# Patient Record
Sex: Male | Born: 1935 | Race: White | Hispanic: No | Marital: Married | Smoking: Never smoker
Health system: Southern US, Community
[De-identification: ages and names within clinical notes are randomized; demographics above are authoritative.]

## PROBLEM LIST (undated history)

## (undated) DIAGNOSIS — G8918 Other acute postprocedural pain: Secondary | ICD-10-CM

## (undated) DIAGNOSIS — F4321 Adjustment disorder with depressed mood: Secondary | ICD-10-CM

## (undated) DIAGNOSIS — E785 Hyperlipidemia, unspecified: Secondary | ICD-10-CM

## (undated) DIAGNOSIS — I10 Essential (primary) hypertension: Secondary | ICD-10-CM

## (undated) DIAGNOSIS — R5381 Other malaise: Secondary | ICD-10-CM

## (undated) DIAGNOSIS — I359 Nonrheumatic aortic valve disorder, unspecified: Secondary | ICD-10-CM

## (undated) DIAGNOSIS — R0789 Other chest pain: Secondary | ICD-10-CM

## (undated) DIAGNOSIS — M1712 Unilateral primary osteoarthritis, left knee: Secondary | ICD-10-CM

## (undated) DIAGNOSIS — R001 Bradycardia, unspecified: Secondary | ICD-10-CM

## (undated) DIAGNOSIS — I4891 Unspecified atrial fibrillation: Secondary | ICD-10-CM

## (undated) DIAGNOSIS — M792 Neuralgia and neuritis, unspecified: Secondary | ICD-10-CM

## (undated) DIAGNOSIS — F0390 Unspecified dementia without behavioral disturbance: Secondary | ICD-10-CM

## (undated) DIAGNOSIS — I6350 Cerebral infarction due to unspecified occlusion or stenosis of unspecified cerebral artery: Secondary | ICD-10-CM

## (undated) DIAGNOSIS — I2510 Atherosclerotic heart disease of native coronary artery without angina pectoris: Secondary | ICD-10-CM

## (undated) DIAGNOSIS — N400 Enlarged prostate without lower urinary tract symptoms: Secondary | ICD-10-CM

## (undated) DIAGNOSIS — I69919 Unspecified symptoms and signs involving cognitive functions following unspecified cerebrovascular disease: Secondary | ICD-10-CM

## (undated) HISTORY — DX: Benign prostatic hyperplasia without lower urinary tract symptoms: N40.0

## (undated) HISTORY — DX: Adjustment disorder with depressed mood: F43.21

## (undated) HISTORY — DX: Nonrheumatic aortic valve disorder, unspecified: I35.9

## (undated) HISTORY — DX: Other chest pain: R07.89

## (undated) HISTORY — DX: Other malaise: R53.81

## (undated) HISTORY — DX: Neuralgia and neuritis, unspecified: M79.2

## (undated) HISTORY — DX: Other acute postprocedural pain: G89.18

## (undated) HISTORY — PX: CARDIAC SURGERY: SHX584

## (undated) HISTORY — DX: Unilateral primary osteoarthritis, left knee: M17.12

---

## 1998-08-07 ENCOUNTER — Emergency Department (HOSPITAL_COMMUNITY): Admission: EM | Admit: 1998-08-07 | Discharge: 1998-08-07 | Payer: Self-pay | Admitting: Emergency Medicine

## 1998-08-23 ENCOUNTER — Encounter: Payer: Self-pay | Admitting: Urology

## 1998-08-24 ENCOUNTER — Encounter: Payer: Self-pay | Admitting: Urology

## 1998-08-25 ENCOUNTER — Inpatient Hospital Stay (HOSPITAL_COMMUNITY): Admission: RE | Admit: 1998-08-25 | Discharge: 1998-08-27 | Payer: Self-pay | Admitting: Urology

## 1998-08-26 ENCOUNTER — Encounter: Payer: Self-pay | Admitting: Urology

## 1998-11-10 ENCOUNTER — Emergency Department (HOSPITAL_COMMUNITY): Admission: EM | Admit: 1998-11-10 | Discharge: 1998-11-10 | Payer: Self-pay | Admitting: Emergency Medicine

## 2002-08-31 DIAGNOSIS — I4891 Unspecified atrial fibrillation: Secondary | ICD-10-CM

## 2002-08-31 HISTORY — DX: Unspecified atrial fibrillation: I48.91

## 2002-10-07 ENCOUNTER — Ambulatory Visit (HOSPITAL_COMMUNITY): Admission: RE | Admit: 2002-10-07 | Discharge: 2002-10-07 | Payer: Self-pay | Admitting: *Deleted

## 2002-10-07 ENCOUNTER — Encounter: Payer: Self-pay | Admitting: *Deleted

## 2002-11-09 ENCOUNTER — Encounter: Payer: Self-pay | Admitting: *Deleted

## 2002-11-09 ENCOUNTER — Ambulatory Visit (HOSPITAL_COMMUNITY): Admission: RE | Admit: 2002-11-09 | Discharge: 2002-11-10 | Payer: Self-pay | Admitting: *Deleted

## 2004-02-28 DIAGNOSIS — I712 Thoracic aortic aneurysm, without rupture, unspecified: Secondary | ICD-10-CM | POA: Insufficient documentation

## 2004-02-28 DIAGNOSIS — I6350 Cerebral infarction due to unspecified occlusion or stenosis of unspecified cerebral artery: Secondary | ICD-10-CM

## 2004-02-28 DIAGNOSIS — Z952 Presence of prosthetic heart valve: Secondary | ICD-10-CM | POA: Insufficient documentation

## 2004-02-28 HISTORY — DX: Cerebral infarction due to unspecified occlusion or stenosis of unspecified cerebral artery: I63.50

## 2004-07-24 ENCOUNTER — Ambulatory Visit: Payer: Self-pay

## 2004-08-11 ENCOUNTER — Ambulatory Visit: Payer: Self-pay | Admitting: *Deleted

## 2004-08-11 ENCOUNTER — Ambulatory Visit: Payer: Self-pay | Admitting: Cardiology

## 2004-08-18 ENCOUNTER — Ambulatory Visit: Payer: Self-pay

## 2004-08-30 ENCOUNTER — Ambulatory Visit: Payer: Self-pay | Admitting: Cardiology

## 2004-09-11 ENCOUNTER — Ambulatory Visit: Payer: Self-pay | Admitting: Cardiology

## 2004-09-25 ENCOUNTER — Ambulatory Visit: Payer: Self-pay | Admitting: Cardiology

## 2004-09-26 ENCOUNTER — Ambulatory Visit: Payer: Self-pay | Admitting: Family Medicine

## 2004-09-27 ENCOUNTER — Ambulatory Visit: Payer: Self-pay | Admitting: Family Medicine

## 2004-09-28 ENCOUNTER — Ambulatory Visit: Payer: Self-pay | Admitting: Family Medicine

## 2004-10-05 ENCOUNTER — Ambulatory Visit: Payer: Self-pay | Admitting: Family Medicine

## 2004-10-09 ENCOUNTER — Ambulatory Visit: Payer: Self-pay | Admitting: Cardiology

## 2004-10-23 ENCOUNTER — Ambulatory Visit: Payer: Self-pay | Admitting: Cardiology

## 2004-10-25 ENCOUNTER — Ambulatory Visit: Payer: Self-pay | Admitting: *Deleted

## 2004-11-09 ENCOUNTER — Ambulatory Visit: Payer: Self-pay | Admitting: Family Medicine

## 2004-11-09 ENCOUNTER — Ambulatory Visit: Payer: Self-pay

## 2004-11-13 ENCOUNTER — Ambulatory Visit: Payer: Self-pay | Admitting: *Deleted

## 2004-11-21 ENCOUNTER — Ambulatory Visit: Payer: Self-pay | Admitting: Internal Medicine

## 2004-12-29 ENCOUNTER — Ambulatory Visit: Payer: Self-pay | Admitting: Cardiology

## 2005-01-04 ENCOUNTER — Ambulatory Visit: Payer: Self-pay | Admitting: Family Medicine

## 2005-01-12 ENCOUNTER — Ambulatory Visit: Payer: Self-pay | Admitting: Cardiology

## 2005-01-30 ENCOUNTER — Encounter (INDEPENDENT_AMBULATORY_CARE_PROVIDER_SITE_OTHER): Payer: Self-pay | Admitting: Internal Medicine

## 2005-01-30 ENCOUNTER — Ambulatory Visit: Payer: Self-pay | Admitting: Family Medicine

## 2005-02-05 ENCOUNTER — Ambulatory Visit: Payer: Self-pay | Admitting: Cardiology

## 2005-02-09 ENCOUNTER — Ambulatory Visit: Payer: Self-pay | Admitting: Family Medicine

## 2005-02-20 ENCOUNTER — Ambulatory Visit: Payer: Self-pay | Admitting: Cardiology

## 2005-03-09 ENCOUNTER — Ambulatory Visit: Payer: Self-pay | Admitting: Cardiology

## 2005-03-22 ENCOUNTER — Ambulatory Visit: Payer: Self-pay | Admitting: Cardiology

## 2005-03-25 ENCOUNTER — Inpatient Hospital Stay (HOSPITAL_COMMUNITY): Admission: EM | Admit: 2005-03-25 | Discharge: 2005-04-04 | Payer: Self-pay | Admitting: Emergency Medicine

## 2005-03-25 ENCOUNTER — Ambulatory Visit: Payer: Self-pay | Admitting: Family Medicine

## 2005-03-28 ENCOUNTER — Ambulatory Visit: Payer: Self-pay | Admitting: Cardiology

## 2005-03-28 ENCOUNTER — Encounter: Payer: Self-pay | Admitting: Cardiology

## 2005-04-09 ENCOUNTER — Ambulatory Visit: Payer: Self-pay | Admitting: Family Medicine

## 2005-04-12 ENCOUNTER — Ambulatory Visit: Payer: Self-pay | Admitting: Family Medicine

## 2005-04-27 ENCOUNTER — Ambulatory Visit: Payer: Self-pay | Admitting: Internal Medicine

## 2005-05-03 ENCOUNTER — Ambulatory Visit: Payer: Self-pay | Admitting: Cardiology

## 2005-06-07 ENCOUNTER — Ambulatory Visit: Payer: Self-pay | Admitting: Cardiology

## 2005-06-07 ENCOUNTER — Ambulatory Visit: Payer: Self-pay | Admitting: Internal Medicine

## 2005-06-11 ENCOUNTER — Ambulatory Visit: Payer: Self-pay | Admitting: Family Medicine

## 2005-06-15 ENCOUNTER — Ambulatory Visit: Payer: Self-pay

## 2005-06-20 ENCOUNTER — Ambulatory Visit: Payer: Self-pay | Admitting: Internal Medicine

## 2005-07-03 ENCOUNTER — Ambulatory Visit: Payer: Self-pay | Admitting: *Deleted

## 2005-07-12 ENCOUNTER — Ambulatory Visit: Payer: Self-pay | Admitting: Family Medicine

## 2005-07-27 ENCOUNTER — Ambulatory Visit: Payer: Self-pay | Admitting: Cardiology

## 2005-08-06 ENCOUNTER — Ambulatory Visit: Payer: Self-pay | Admitting: Cardiology

## 2005-08-23 ENCOUNTER — Ambulatory Visit: Payer: Self-pay | Admitting: Cardiology

## 2005-08-31 ENCOUNTER — Ambulatory Visit: Payer: Self-pay | Admitting: Cardiology

## 2005-09-07 ENCOUNTER — Ambulatory Visit: Payer: Self-pay

## 2005-09-07 ENCOUNTER — Ambulatory Visit: Payer: Self-pay | Admitting: Cardiology

## 2005-09-07 ENCOUNTER — Encounter: Payer: Self-pay | Admitting: Cardiology

## 2005-09-17 ENCOUNTER — Ambulatory Visit: Payer: Self-pay | Admitting: Cardiology

## 2005-10-04 ENCOUNTER — Ambulatory Visit: Payer: Self-pay | Admitting: Family Medicine

## 2005-10-08 ENCOUNTER — Ambulatory Visit: Payer: Self-pay | Admitting: Internal Medicine

## 2005-10-08 ENCOUNTER — Ambulatory Visit: Payer: Self-pay | Admitting: Cardiology

## 2005-11-05 ENCOUNTER — Ambulatory Visit: Payer: Self-pay | Admitting: Cardiology

## 2005-11-08 ENCOUNTER — Ambulatory Visit: Payer: Self-pay | Admitting: Family Medicine

## 2005-11-21 ENCOUNTER — Ambulatory Visit: Payer: Self-pay | Admitting: Cardiology

## 2005-12-03 ENCOUNTER — Ambulatory Visit: Payer: Self-pay | Admitting: Cardiology

## 2005-12-14 ENCOUNTER — Ambulatory Visit: Payer: Self-pay | Admitting: Family Medicine

## 2005-12-17 ENCOUNTER — Ambulatory Visit: Payer: Self-pay | Admitting: Cardiology

## 2006-02-28 ENCOUNTER — Ambulatory Visit: Payer: Self-pay | Admitting: Cardiology

## 2006-03-28 ENCOUNTER — Ambulatory Visit: Payer: Self-pay | Admitting: Cardiology

## 2006-04-18 ENCOUNTER — Ambulatory Visit: Payer: Self-pay | Admitting: Family Medicine

## 2006-05-02 ENCOUNTER — Ambulatory Visit: Payer: Self-pay | Admitting: Cardiology

## 2006-05-06 ENCOUNTER — Ambulatory Visit: Payer: Self-pay | Admitting: Cardiology

## 2006-05-08 ENCOUNTER — Ambulatory Visit: Payer: Self-pay | Admitting: Cardiology

## 2006-05-14 ENCOUNTER — Ambulatory Visit: Payer: Self-pay | Admitting: Internal Medicine

## 2006-05-14 ENCOUNTER — Ambulatory Visit: Payer: Self-pay | Admitting: Cardiology

## 2006-05-14 LAB — CONVERTED CEMR LAB
ALT: 15 units/L (ref 0–40)
AST: 16 units/L (ref 0–37)
BUN: 10 mg/dL (ref 6–23)
Cholesterol: 125 mg/dL (ref 0–200)
Glomerular Filtration Rate, Af Am: 93 mL/min/{1.73_m2}
LDL Cholesterol: 75 mg/dL (ref 0–99)
Triglyceride fasting, serum: 133 mg/dL (ref 0–149)
VLDL: 27 mg/dL (ref 0–40)

## 2006-05-22 ENCOUNTER — Ambulatory Visit: Payer: Self-pay | Admitting: Cardiology

## 2006-06-05 ENCOUNTER — Ambulatory Visit: Payer: Self-pay | Admitting: Cardiology

## 2006-06-21 ENCOUNTER — Ambulatory Visit: Payer: Self-pay | Admitting: Cardiology

## 2006-07-04 ENCOUNTER — Ambulatory Visit: Payer: Self-pay | Admitting: Cardiology

## 2006-07-18 ENCOUNTER — Ambulatory Visit: Payer: Self-pay | Admitting: Cardiology

## 2006-07-22 ENCOUNTER — Ambulatory Visit: Payer: Self-pay | Admitting: Family Medicine

## 2006-08-12 ENCOUNTER — Ambulatory Visit: Payer: Self-pay | Admitting: Cardiology

## 2006-08-19 ENCOUNTER — Ambulatory Visit: Payer: Self-pay | Admitting: Internal Medicine

## 2006-08-29 ENCOUNTER — Ambulatory Visit: Payer: Self-pay | Admitting: *Deleted

## 2006-09-11 ENCOUNTER — Ambulatory Visit: Payer: Self-pay | Admitting: Cardiology

## 2006-10-02 ENCOUNTER — Ambulatory Visit: Payer: Self-pay | Admitting: Cardiology

## 2006-10-15 ENCOUNTER — Ambulatory Visit: Payer: Self-pay | Admitting: Family Medicine

## 2006-10-31 ENCOUNTER — Ambulatory Visit: Payer: Self-pay | Admitting: Internal Medicine

## 2006-11-14 ENCOUNTER — Ambulatory Visit: Payer: Self-pay | Admitting: Internal Medicine

## 2006-11-27 ENCOUNTER — Ambulatory Visit: Payer: Self-pay | Admitting: Cardiology

## 2006-12-11 ENCOUNTER — Ambulatory Visit: Payer: Self-pay | Admitting: Cardiovascular Disease

## 2006-12-19 ENCOUNTER — Ambulatory Visit: Payer: Self-pay | Admitting: Cardiology

## 2007-01-07 ENCOUNTER — Ambulatory Visit: Payer: Self-pay | Admitting: Cardiology

## 2007-01-07 LAB — CONVERTED CEMR LAB
BUN: 11 mg/dL (ref 6–23)
CO2: 27 meq/L (ref 19–32)
Calcium: 9 mg/dL (ref 8.4–10.5)
Creatinine, Ser: 1 mg/dL (ref 0.4–1.5)
GFR calc non Af Amer: 77 mL/min
Sodium: 142 meq/L (ref 135–145)

## 2007-01-21 ENCOUNTER — Ambulatory Visit: Payer: Self-pay | Admitting: Cardiology

## 2007-02-25 DIAGNOSIS — I10 Essential (primary) hypertension: Secondary | ICD-10-CM

## 2007-02-27 DIAGNOSIS — I2510 Atherosclerotic heart disease of native coronary artery without angina pectoris: Secondary | ICD-10-CM

## 2007-02-27 DIAGNOSIS — E785 Hyperlipidemia, unspecified: Secondary | ICD-10-CM

## 2007-02-27 HISTORY — DX: Hyperlipidemia, unspecified: E78.5

## 2007-02-27 HISTORY — DX: Atherosclerotic heart disease of native coronary artery without angina pectoris: I25.10

## 2007-03-04 ENCOUNTER — Ambulatory Visit: Payer: Self-pay | Admitting: Family Medicine

## 2007-03-07 LAB — CONVERTED CEMR LAB
Albumin: 4.4 g/dL (ref 3.5–5.2)
Alkaline Phosphatase: 42 units/L (ref 39–117)
Basophils Absolute: 0 10*3/uL (ref 0.0–0.1)
Basophils Relative: 0 % (ref 0–1)
CO2: 22 meq/L (ref 19–32)
Calcium: 9.6 mg/dL (ref 8.4–10.5)
Chloride: 110 meq/L (ref 96–112)
Creatinine, Ser: 0.97 mg/dL (ref 0.40–1.50)
Eosinophils Relative: 2 % (ref 0–5)
Glucose, Bld: 234 mg/dL — ABNORMAL HIGH (ref 70–99)
HCT: 45.5 % (ref 39.0–52.0)
HDL: 30 mg/dL — ABNORMAL LOW (ref >39)
INR: 2 — ABNORMAL HIGH (ref 0.0–1.5)
LDL Cholesterol: 52 mg/dL (ref 0–99)
Lymphocytes Relative: 34 % (ref 12–46)
Lymphs Abs: 1.7 10*3/uL (ref 0.7–3.3)
MCV: 95.2 fL (ref 78.0–100.0)
Monocytes Absolute: 0.5 10*3/uL (ref 0.2–0.7)
Monocytes Relative: 9 % (ref 3–11)
Neutro Abs: 2.7 10*3/uL (ref 1.7–7.7)
Neutrophils Relative %: 55 % (ref 43–77)
Prothrombin Time: 23.6 s — ABNORMAL HIGH (ref 11.6–15.2)
Sodium: 145 meq/L (ref 135–145)
Total Protein: 7.8 g/dL (ref 6.0–8.3)
WBC: 5 10*3/uL (ref 4.0–10.5)

## 2007-03-10 ENCOUNTER — Ambulatory Visit: Payer: Self-pay | Admitting: Cardiology

## 2007-03-11 ENCOUNTER — Encounter (INDEPENDENT_AMBULATORY_CARE_PROVIDER_SITE_OTHER): Payer: Self-pay | Admitting: Family Medicine

## 2007-03-18 ENCOUNTER — Encounter (INDEPENDENT_AMBULATORY_CARE_PROVIDER_SITE_OTHER): Payer: Self-pay | Admitting: *Deleted

## 2007-03-18 ENCOUNTER — Encounter (INDEPENDENT_AMBULATORY_CARE_PROVIDER_SITE_OTHER): Payer: Self-pay | Admitting: Family Medicine

## 2007-03-18 DIAGNOSIS — M1990 Unspecified osteoarthritis, unspecified site: Secondary | ICD-10-CM | POA: Insufficient documentation

## 2007-03-24 ENCOUNTER — Ambulatory Visit: Payer: Self-pay | Admitting: Internal Medicine

## 2007-04-14 ENCOUNTER — Ambulatory Visit: Payer: Self-pay | Admitting: Cardiology

## 2007-05-05 ENCOUNTER — Ambulatory Visit: Payer: Self-pay | Admitting: Cardiology

## 2007-05-15 ENCOUNTER — Ambulatory Visit: Payer: Self-pay | Admitting: Cardiovascular Disease

## 2007-05-28 ENCOUNTER — Ambulatory Visit: Payer: Self-pay | Admitting: Cardiology

## 2007-05-28 ENCOUNTER — Encounter (INDEPENDENT_AMBULATORY_CARE_PROVIDER_SITE_OTHER): Payer: Self-pay | Admitting: Family Medicine

## 2007-06-11 ENCOUNTER — Ambulatory Visit: Payer: Self-pay | Admitting: Cardiovascular Disease

## 2007-06-16 ENCOUNTER — Ambulatory Visit: Payer: Self-pay | Admitting: Internal Medicine

## 2007-06-16 ENCOUNTER — Encounter (INDEPENDENT_AMBULATORY_CARE_PROVIDER_SITE_OTHER): Payer: Self-pay | Admitting: Family Medicine

## 2007-06-16 ENCOUNTER — Encounter: Payer: Self-pay | Admitting: Cardiology

## 2007-06-16 ENCOUNTER — Ambulatory Visit: Payer: Self-pay

## 2007-06-16 LAB — CONVERTED CEMR LAB
ALT: 16 units/L (ref 0–53)
AST: 20 units/L (ref 0–37)
Alkaline Phosphatase: 34 units/L — ABNORMAL LOW (ref 39–117)
BUN: 12 mg/dL (ref 6–23)
Calcium: 9.4 mg/dL (ref 8.4–10.5)
Chloride: 108 meq/L (ref 96–112)
Creatinine, Ser: 0.9 mg/dL (ref 0.4–1.5)
GFR calc Af Amer: 105 mL/min
GFR calc non Af Amer: 87 mL/min
Glucose, Bld: 153 mg/dL — ABNORMAL HIGH (ref 70–99)
HCT: 39.5 % (ref 39.0–52.0)
Lymphocytes Relative: 35.5 % (ref 12.0–46.0)
MCV: 90.6 fL (ref 78.0–100.0)
Monocytes Absolute: 0.4 10*3/uL (ref 0.2–0.7)
RDW: 12.8 % (ref 11.5–14.6)
Sodium: 143 meq/L (ref 135–145)
Total Protein: 7.1 g/dL (ref 6.0–8.3)
WBC: 5.1 10*3/uL (ref 4.5–10.5)

## 2007-07-07 ENCOUNTER — Ambulatory Visit: Payer: Self-pay | Admitting: Cardiology

## 2007-08-05 ENCOUNTER — Ambulatory Visit: Payer: Self-pay | Admitting: Cardiology

## 2007-08-19 ENCOUNTER — Ambulatory Visit: Payer: Self-pay | Admitting: Family Medicine

## 2007-08-19 LAB — CONVERTED CEMR LAB: Hgb A1c MFr Bld: 9.4 %

## 2007-08-20 ENCOUNTER — Ambulatory Visit: Payer: Self-pay | Admitting: Internal Medicine

## 2007-09-09 ENCOUNTER — Observation Stay (HOSPITAL_COMMUNITY): Admission: EM | Admit: 2007-09-09 | Discharge: 2007-09-10 | Payer: Self-pay | Admitting: Emergency Medicine

## 2007-09-09 ENCOUNTER — Ambulatory Visit: Payer: Self-pay | Admitting: Family Medicine

## 2007-09-09 DIAGNOSIS — I498 Other specified cardiac arrhythmias: Secondary | ICD-10-CM | POA: Insufficient documentation

## 2007-09-10 ENCOUNTER — Encounter (INDEPENDENT_AMBULATORY_CARE_PROVIDER_SITE_OTHER): Payer: Self-pay | Admitting: Family Medicine

## 2007-09-11 ENCOUNTER — Ambulatory Visit: Payer: Self-pay | Admitting: Cardiology

## 2007-09-17 ENCOUNTER — Ambulatory Visit: Payer: Self-pay | Admitting: Cardiology

## 2007-09-26 ENCOUNTER — Inpatient Hospital Stay (HOSPITAL_COMMUNITY): Admission: EM | Admit: 2007-09-26 | Discharge: 2007-10-02 | Payer: Self-pay | Admitting: Emergency Medicine

## 2007-09-26 ENCOUNTER — Encounter (INDEPENDENT_AMBULATORY_CARE_PROVIDER_SITE_OTHER): Payer: Self-pay | Admitting: Family Medicine

## 2007-09-26 ENCOUNTER — Ambulatory Visit: Payer: Self-pay | Admitting: Cardiology

## 2007-09-26 DIAGNOSIS — K922 Gastrointestinal hemorrhage, unspecified: Secondary | ICD-10-CM | POA: Insufficient documentation

## 2007-10-02 ENCOUNTER — Ambulatory Visit: Payer: Self-pay | Admitting: Gastroenterology

## 2007-10-14 ENCOUNTER — Encounter (INDEPENDENT_AMBULATORY_CARE_PROVIDER_SITE_OTHER): Payer: Self-pay | Admitting: Family Medicine

## 2007-10-20 ENCOUNTER — Ambulatory Visit: Payer: Self-pay | Admitting: Cardiology

## 2007-10-20 LAB — CONVERTED CEMR LAB
BUN: 17 mg/dL (ref 6–23)
Basophils Relative: 0.4 % (ref 0.0–1.0)
Calcium: 9.3 mg/dL (ref 8.4–10.5)
Chloride: 107 meq/L (ref 96–112)
Creatinine, Ser: 0.9 mg/dL (ref 0.4–1.5)
Eosinophils Relative: 2.3 % (ref 0.0–5.0)
GFR calc non Af Amer: 87 mL/min
Glucose, Bld: 110 mg/dL — ABNORMAL HIGH (ref 70–99)
HCT: 35.6 % — ABNORMAL LOW (ref 39.0–52.0)
Lymphocytes Relative: 30.7 % (ref 12.0–46.0)
MCHC: 33.7 g/dL (ref 30.0–36.0)
MCV: 91.5 fL (ref 78.0–100.0)
Monocytes Relative: 8.6 % (ref 3.0–12.0)
Neutrophils Relative %: 58 % (ref 43.0–77.0)
Potassium: 3.8 meq/L (ref 3.5–5.1)
RBC: 3.89 M/uL — ABNORMAL LOW (ref 4.22–5.81)

## 2007-11-13 ENCOUNTER — Encounter: Payer: Self-pay | Admitting: Internal Medicine

## 2007-12-24 ENCOUNTER — Encounter (INDEPENDENT_AMBULATORY_CARE_PROVIDER_SITE_OTHER): Payer: Self-pay | Admitting: Family Medicine

## 2007-12-24 ENCOUNTER — Ambulatory Visit: Payer: Self-pay | Admitting: Internal Medicine

## 2007-12-24 ENCOUNTER — Ambulatory Visit: Payer: Self-pay | Admitting: Cardiology

## 2007-12-24 LAB — CONVERTED CEMR LAB
Alkaline Phosphatase: 42 units/L (ref 39–117)
BUN: 15 mg/dL (ref 6–23)
Basophils Relative: 0.7 % (ref 0.0–1.0)
Bilirubin, Direct: 0.1 mg/dL (ref 0.0–0.3)
Calcium: 9.6 mg/dL (ref 8.4–10.5)
Cholesterol: 110 mg/dL (ref 0–200)
Direct LDL: 59.2 mg/dL
Eosinophils Absolute: 0.2 10*3/uL (ref 0.0–0.7)
Eosinophils Relative: 3.6 % (ref 0.0–5.0)
HDL: 26.5 mg/dL — ABNORMAL LOW (ref >39.0)
Hemoglobin: 12.3 g/dL — ABNORMAL LOW (ref 13.0–17.0)
Lymphocytes Relative: 27.7 % (ref 12.0–46.0)
Neutro Abs: 3.8 10*3/uL (ref 1.4–7.7)
Potassium: 4.8 meq/L (ref 3.5–5.1)
Pro B Natriuretic peptide (BNP): 138 pg/mL — ABNORMAL HIGH (ref 0.0–100.0)
Sodium: 137 meq/L (ref 135–145)
Total CHOL/HDL Ratio: 4.2
Total Protein: 7.4 g/dL (ref 6.0–8.3)
Triglycerides: 267 mg/dL (ref 0–149)
WBC: 6.5 10*3/uL (ref 4.5–10.5)

## 2007-12-30 ENCOUNTER — Ambulatory Visit: Payer: Self-pay

## 2007-12-30 ENCOUNTER — Encounter (INDEPENDENT_AMBULATORY_CARE_PROVIDER_SITE_OTHER): Payer: Self-pay | Admitting: Family Medicine

## 2008-03-03 ENCOUNTER — Ambulatory Visit: Payer: Self-pay | Admitting: Cardiology

## 2008-03-09 ENCOUNTER — Telehealth (INDEPENDENT_AMBULATORY_CARE_PROVIDER_SITE_OTHER): Payer: Self-pay | Admitting: *Deleted

## 2008-03-09 ENCOUNTER — Ambulatory Visit: Payer: Self-pay | Admitting: Cardiology

## 2008-03-16 ENCOUNTER — Ambulatory Visit: Payer: Self-pay | Admitting: Family Medicine

## 2008-03-17 ENCOUNTER — Ambulatory Visit: Payer: Self-pay | Admitting: Cardiovascular Disease

## 2008-03-26 ENCOUNTER — Ambulatory Visit: Payer: Self-pay | Admitting: Cardiology

## 2008-03-26 LAB — CONVERTED CEMR LAB
Basophils Absolute: 0 10*3/uL (ref 0.0–0.1)
Basophils Relative: 0.4 % (ref 0.0–3.0)
Eosinophils Absolute: 0.2 10*3/uL (ref 0.0–0.7)
Eosinophils Relative: 2.7 % (ref 0.0–5.0)
HCT: 40.5 % (ref 39.0–52.0)
Hemoglobin: 14 g/dL (ref 13.0–17.0)
Monocytes Absolute: 0.6 10*3/uL (ref 0.1–1.0)
Neutro Abs: 2.8 10*3/uL (ref 1.4–7.7)
Platelets: 131 10*3/uL — ABNORMAL LOW (ref 150–400)

## 2008-04-02 ENCOUNTER — Ambulatory Visit: Payer: Self-pay | Admitting: Cardiovascular Disease

## 2008-04-02 ENCOUNTER — Ambulatory Visit: Payer: Self-pay | Admitting: Cardiology

## 2008-04-02 LAB — CONVERTED CEMR LAB
BUN: 16 mg/dL (ref 6–23)
Basophils Absolute: 0 10*3/uL (ref 0.0–0.1)
Basophils Relative: 0.9 % (ref 0.0–3.0)
Chloride: 101 meq/L (ref 96–112)
Eosinophils Absolute: 0.2 10*3/uL (ref 0.0–0.7)
Eosinophils Relative: 3.3 % (ref 0.0–5.0)
GFR calc non Af Amer: 87 mL/min
Glucose, Bld: 280 mg/dL — ABNORMAL HIGH (ref 70–99)
Lymphocytes Relative: 34.8 % (ref 12.0–46.0)
Neutro Abs: 2.8 10*3/uL (ref 1.4–7.7)
Neutrophils Relative %: 51.7 % (ref 43.0–77.0)
Platelets: 136 10*3/uL — ABNORMAL LOW (ref 150–400)
Potassium: 4.6 meq/L (ref 3.5–5.1)
RBC: 4.74 M/uL (ref 4.22–5.81)
RDW: 16.5 % — ABNORMAL HIGH (ref 11.5–14.6)
WBC: 5.4 10*3/uL (ref 4.5–10.5)

## 2008-04-09 ENCOUNTER — Ambulatory Visit: Payer: Self-pay | Admitting: Cardiology

## 2008-04-13 ENCOUNTER — Ambulatory Visit: Payer: Self-pay | Admitting: Family Medicine

## 2008-04-13 ENCOUNTER — Encounter (INDEPENDENT_AMBULATORY_CARE_PROVIDER_SITE_OTHER): Payer: Self-pay | Admitting: Family Medicine

## 2008-04-20 ENCOUNTER — Ambulatory Visit: Payer: Self-pay | Admitting: Family Medicine

## 2008-04-30 ENCOUNTER — Ambulatory Visit: Payer: Self-pay | Admitting: Internal Medicine

## 2008-05-18 ENCOUNTER — Ambulatory Visit: Payer: Self-pay | Admitting: Family Medicine

## 2008-05-18 LAB — CONVERTED CEMR LAB: Blood Glucose, Fingerstick: 212

## 2008-06-03 ENCOUNTER — Ambulatory Visit: Payer: Self-pay | Admitting: Internal Medicine

## 2008-07-19 ENCOUNTER — Ambulatory Visit: Payer: Self-pay | Admitting: Family Medicine

## 2008-07-19 LAB — CONVERTED CEMR LAB: Blood Glucose, Fingerstick: 145

## 2008-09-17 ENCOUNTER — Telehealth (INDEPENDENT_AMBULATORY_CARE_PROVIDER_SITE_OTHER): Payer: Self-pay | Admitting: Family Medicine

## 2008-10-14 ENCOUNTER — Ambulatory Visit: Payer: Self-pay | Admitting: Cardiology

## 2008-10-18 ENCOUNTER — Telehealth (INDEPENDENT_AMBULATORY_CARE_PROVIDER_SITE_OTHER): Payer: Self-pay | Admitting: Family Medicine

## 2008-11-01 ENCOUNTER — Ambulatory Visit: Payer: Self-pay | Admitting: Internal Medicine

## 2008-11-15 ENCOUNTER — Ambulatory Visit: Payer: Self-pay | Admitting: Internal Medicine

## 2008-11-16 ENCOUNTER — Ambulatory Visit: Payer: Self-pay | Admitting: Family Medicine

## 2008-11-16 LAB — CONVERTED CEMR LAB
Blood Glucose, Fingerstick: 242
Hgb A1c MFr Bld: 8.6 %

## 2008-11-17 LAB — CONVERTED CEMR LAB
Alkaline Phosphatase: 44 units/L (ref 39–117)
BUN: 15 mg/dL (ref 6–23)
Basophils Absolute: 0 10*3/uL (ref 0.0–0.1)
Basophils Relative: 0 % (ref 0–1)
Calcium: 9.7 mg/dL (ref 8.4–10.5)
Chloride: 107 meq/L (ref 96–112)
Cholesterol: 112 mg/dL (ref 0–200)
Eosinophils Absolute: 0.2 10*3/uL (ref 0.0–0.7)
Glucose, Bld: 306 mg/dL — ABNORMAL HIGH (ref 70–99)
HCT: 42.8 % (ref 39.0–52.0)
HDL: 30 mg/dL — ABNORMAL LOW (ref >39)
LDL Cholesterol: 57 mg/dL (ref 0–99)
Lymphs Abs: 1.8 10*3/uL (ref 0.7–4.0)
MCHC: 33.2 g/dL (ref 30.0–36.0)
Monocytes Relative: 9 % (ref 3–12)
Neutro Abs: 2.5 10*3/uL (ref 1.7–7.7)
Potassium: 5.1 meq/L (ref 3.5–5.3)
RBC: 4.65 M/uL (ref 4.22–5.81)
Sodium: 141 meq/L (ref 135–145)
VLDL: 25 mg/dL (ref 0–40)

## 2008-11-30 ENCOUNTER — Encounter: Payer: Self-pay | Admitting: *Deleted

## 2008-12-02 ENCOUNTER — Ambulatory Visit: Payer: Self-pay | Admitting: Internal Medicine

## 2008-12-16 ENCOUNTER — Encounter (INDEPENDENT_AMBULATORY_CARE_PROVIDER_SITE_OTHER): Payer: Self-pay | Admitting: Cardiology

## 2008-12-16 ENCOUNTER — Ambulatory Visit: Payer: Self-pay | Admitting: Internal Medicine

## 2009-01-05 ENCOUNTER — Encounter: Payer: Self-pay | Admitting: *Deleted

## 2009-01-06 ENCOUNTER — Ambulatory Visit: Payer: Self-pay | Admitting: Cardiology

## 2009-01-06 LAB — CONVERTED CEMR LAB
Albumin: 3.6 g/dL (ref 3.5–5.2)
BUN: 13 mg/dL (ref 6–23)
Basophils Absolute: 0 10*3/uL (ref 0.0–0.1)
Basophils Relative: 0.5 % (ref 0.0–3.0)
Calcium: 8.9 mg/dL (ref 8.4–10.5)
Cholesterol: 125 mg/dL (ref 0–200)
Eosinophils Absolute: 0.1 10*3/uL (ref 0.0–0.7)
GFR calc non Af Amer: 86.46 mL/min (ref >60)
Glucose, Bld: 203 mg/dL — ABNORMAL HIGH (ref 70–99)
HDL: 29.4 mg/dL — ABNORMAL LOW (ref >39.00)
Lymphocytes Relative: 31.4 % (ref 12.0–46.0)
Lymphs Abs: 1.8 10*3/uL (ref 0.7–4.0)
MCHC: 35 g/dL (ref 30.0–36.0)
MCV: 90.1 fL (ref 78.0–100.0)
Monocytes Absolute: 0.6 10*3/uL (ref 0.1–1.0)
Monocytes Relative: 10.2 % (ref 3.0–12.0)
Neutro Abs: 3.2 10*3/uL (ref 1.4–7.7)
RBC: 4.46 M/uL (ref 4.22–5.81)
RDW: 12.9 % (ref 11.5–14.6)
Total CHOL/HDL Ratio: 4
Total Protein: 6.8 g/dL (ref 6.0–8.3)
VLDL: 34.4 mg/dL (ref 0.0–40.0)

## 2009-01-07 ENCOUNTER — Encounter: Payer: Self-pay | Admitting: Cardiology

## 2009-01-13 ENCOUNTER — Encounter: Payer: Self-pay | Admitting: Cardiology

## 2009-01-13 ENCOUNTER — Ambulatory Visit: Payer: Self-pay

## 2009-01-18 ENCOUNTER — Encounter (INDEPENDENT_AMBULATORY_CARE_PROVIDER_SITE_OTHER): Payer: Self-pay | Admitting: *Deleted

## 2009-01-18 ENCOUNTER — Ambulatory Visit: Payer: Self-pay | Admitting: Internal Medicine

## 2009-01-18 DIAGNOSIS — R0989 Other specified symptoms and signs involving the circulatory and respiratory systems: Secondary | ICD-10-CM

## 2009-01-26 ENCOUNTER — Ambulatory Visit: Payer: Self-pay | Admitting: Vascular Surgery

## 2009-01-26 ENCOUNTER — Encounter (INDEPENDENT_AMBULATORY_CARE_PROVIDER_SITE_OTHER): Payer: Self-pay | Admitting: Internal Medicine

## 2009-01-26 ENCOUNTER — Ambulatory Visit (HOSPITAL_COMMUNITY): Admission: RE | Admit: 2009-01-26 | Discharge: 2009-01-26 | Payer: Self-pay | Admitting: Internal Medicine

## 2009-01-29 ENCOUNTER — Encounter (INDEPENDENT_AMBULATORY_CARE_PROVIDER_SITE_OTHER): Payer: Self-pay | Admitting: Internal Medicine

## 2009-02-01 ENCOUNTER — Ambulatory Visit: Payer: Self-pay | Admitting: Internal Medicine

## 2009-02-21 ENCOUNTER — Ambulatory Visit: Payer: Self-pay | Admitting: Cardiovascular Disease

## 2009-03-21 ENCOUNTER — Ambulatory Visit: Payer: Self-pay | Admitting: Cardiology

## 2009-04-18 ENCOUNTER — Ambulatory Visit: Payer: Self-pay | Admitting: Internal Medicine

## 2009-04-21 ENCOUNTER — Ambulatory Visit: Payer: Self-pay | Admitting: Internal Medicine

## 2009-04-21 LAB — CONVERTED CEMR LAB: Blood Glucose, Fingerstick: 162

## 2009-05-16 ENCOUNTER — Ambulatory Visit: Payer: Self-pay | Admitting: Cardiology

## 2009-05-16 LAB — CONVERTED CEMR LAB: POC INR: 2

## 2009-06-13 ENCOUNTER — Ambulatory Visit: Payer: Self-pay | Admitting: Cardiology

## 2009-07-13 ENCOUNTER — Ambulatory Visit: Payer: Self-pay | Admitting: Internal Medicine

## 2009-07-13 LAB — CONVERTED CEMR LAB: POC INR: 1.4

## 2009-07-22 ENCOUNTER — Ambulatory Visit: Payer: Self-pay | Admitting: Internal Medicine

## 2009-07-22 LAB — CONVERTED CEMR LAB
Albumin: 4.6 g/dL (ref 3.5–5.2)
BUN: 13 mg/dL (ref 6–23)
Chloride: 105 meq/L (ref 96–112)
LDL Cholesterol: 74 mg/dL (ref 0–99)
Potassium: 5 meq/L (ref 3.5–5.3)
Total Bilirubin: 0.9 mg/dL (ref 0.3–1.2)
Total CHOL/HDL Ratio: 3.9
Total Protein: 7.4 g/dL (ref 6.0–8.3)

## 2009-08-04 ENCOUNTER — Encounter (INDEPENDENT_AMBULATORY_CARE_PROVIDER_SITE_OTHER): Payer: Self-pay | Admitting: Internal Medicine

## 2009-08-23 ENCOUNTER — Ambulatory Visit: Payer: Self-pay | Admitting: Internal Medicine

## 2009-08-24 ENCOUNTER — Telehealth (INDEPENDENT_AMBULATORY_CARE_PROVIDER_SITE_OTHER): Payer: Self-pay | Admitting: *Deleted

## 2009-08-29 ENCOUNTER — Ambulatory Visit: Payer: Self-pay | Admitting: Internal Medicine

## 2009-08-29 LAB — CONVERTED CEMR LAB: POC INR: 2.5

## 2009-09-09 ENCOUNTER — Ambulatory Visit: Payer: Self-pay | Admitting: Internal Medicine

## 2009-09-09 DIAGNOSIS — I69919 Unspecified symptoms and signs involving cognitive functions following unspecified cerebrovascular disease: Secondary | ICD-10-CM

## 2009-09-09 HISTORY — DX: Unspecified symptoms and signs involving cognitive functions following unspecified cerebrovascular disease: I69.919

## 2009-09-10 ENCOUNTER — Telehealth (INDEPENDENT_AMBULATORY_CARE_PROVIDER_SITE_OTHER): Payer: Self-pay | Admitting: Internal Medicine

## 2009-09-12 ENCOUNTER — Encounter (INDEPENDENT_AMBULATORY_CARE_PROVIDER_SITE_OTHER): Payer: Self-pay | Admitting: Internal Medicine

## 2009-09-20 ENCOUNTER — Encounter (INDEPENDENT_AMBULATORY_CARE_PROVIDER_SITE_OTHER): Payer: Self-pay | Admitting: Internal Medicine

## 2009-09-26 ENCOUNTER — Encounter (INDEPENDENT_AMBULATORY_CARE_PROVIDER_SITE_OTHER): Payer: Self-pay | Admitting: Cardiology

## 2009-09-26 ENCOUNTER — Ambulatory Visit: Payer: Self-pay | Admitting: Internal Medicine

## 2009-09-26 LAB — CONVERTED CEMR LAB: POC INR: 1.9

## 2009-10-24 ENCOUNTER — Ambulatory Visit: Payer: Self-pay | Admitting: Cardiology

## 2009-10-24 LAB — CONVERTED CEMR LAB: POC INR: 1.9

## 2009-11-21 ENCOUNTER — Ambulatory Visit: Payer: Self-pay | Admitting: Cardiology

## 2009-11-22 ENCOUNTER — Ambulatory Visit: Payer: Self-pay | Admitting: Internal Medicine

## 2009-11-22 DIAGNOSIS — M25569 Pain in unspecified knee: Secondary | ICD-10-CM

## 2009-11-22 LAB — CONVERTED CEMR LAB
Blood Glucose, Fingerstick: 206
Microalb, Ur: 2.61 mg/dL — ABNORMAL HIGH (ref 0.00–1.89)

## 2009-12-19 ENCOUNTER — Ambulatory Visit (HOSPITAL_COMMUNITY): Admission: RE | Admit: 2009-12-19 | Discharge: 2009-12-19 | Payer: Self-pay | Admitting: Internal Medicine

## 2009-12-19 ENCOUNTER — Ambulatory Visit: Payer: Self-pay | Admitting: Cardiology

## 2009-12-19 LAB — CONVERTED CEMR LAB: POC INR: 2.7

## 2009-12-20 ENCOUNTER — Encounter (INDEPENDENT_AMBULATORY_CARE_PROVIDER_SITE_OTHER): Payer: Self-pay | Admitting: Internal Medicine

## 2010-01-18 ENCOUNTER — Ambulatory Visit: Payer: Self-pay | Admitting: Cardiology

## 2010-01-18 LAB — CONVERTED CEMR LAB: POC INR: 2.3

## 2010-02-20 ENCOUNTER — Ambulatory Visit: Payer: Self-pay | Admitting: Cardiology

## 2010-02-20 LAB — CONVERTED CEMR LAB: POC INR: 2.7

## 2010-03-20 ENCOUNTER — Ambulatory Visit (HOSPITAL_COMMUNITY): Admission: RE | Admit: 2010-03-20 | Discharge: 2010-03-20 | Payer: Self-pay | Admitting: Cardiology

## 2010-03-20 ENCOUNTER — Ambulatory Visit: Payer: Self-pay | Admitting: Internal Medicine

## 2010-03-20 ENCOUNTER — Ambulatory Visit: Payer: Self-pay

## 2010-03-20 ENCOUNTER — Ambulatory Visit: Payer: Self-pay | Admitting: Cardiology

## 2010-03-20 ENCOUNTER — Encounter: Payer: Self-pay | Admitting: Cardiology

## 2010-03-20 LAB — CONVERTED CEMR LAB: POC INR: 3.3

## 2010-03-28 ENCOUNTER — Ambulatory Visit: Payer: Self-pay | Admitting: Internal Medicine

## 2010-03-28 LAB — CONVERTED CEMR LAB: Hgb A1c MFr Bld: 6.8 %

## 2010-04-18 ENCOUNTER — Ambulatory Visit: Payer: Self-pay | Admitting: Cardiology

## 2010-04-18 LAB — CONVERTED CEMR LAB: POC INR: 4.5

## 2010-05-08 ENCOUNTER — Ambulatory Visit: Payer: Self-pay | Admitting: Internal Medicine

## 2010-05-30 ENCOUNTER — Ambulatory Visit: Payer: Self-pay | Admitting: Internal Medicine

## 2010-05-30 LAB — CONVERTED CEMR LAB
ALT: 9 units/L (ref 0–53)
Bilirubin, Direct: 0.2 mg/dL (ref 0.0–0.3)
LDL Cholesterol: 70 mg/dL (ref 0–99)
Total Bilirubin: 0.8 mg/dL (ref 0.3–1.2)
Total CHOL/HDL Ratio: 5.4
VLDL: 31 mg/dL (ref 0–40)

## 2010-06-05 ENCOUNTER — Ambulatory Visit: Payer: Self-pay | Admitting: Cardiovascular Disease

## 2010-06-05 LAB — CONVERTED CEMR LAB: POC INR: 2.9

## 2010-06-21 ENCOUNTER — Encounter (INDEPENDENT_AMBULATORY_CARE_PROVIDER_SITE_OTHER): Payer: Self-pay | Admitting: Internal Medicine

## 2010-07-04 ENCOUNTER — Ambulatory Visit: Admission: RE | Admit: 2010-07-04 | Discharge: 2010-07-04 | Payer: Self-pay | Source: Home / Self Care

## 2010-07-04 LAB — CONVERTED CEMR LAB: POC INR: 2.3

## 2010-07-30 LAB — CONVERTED CEMR LAB
Blood Glucose, Fingerstick: 391
POC INR: 2.8

## 2010-08-01 NOTE — Medication Information (Signed)
Summary: rov/tm  Anticoagulant Therapy  Managed by: Bethena Midget, RN, BSN Referring MD: Olga Millers MD Supervising MD: Shirlee Latch MD, Naksh Radi Indication 1: atrial fibrillation (ICD-427.31) Indication 2: reactivated 07/24/04..returning from [facility] (ICD-1/23/6 Lab Used: [facility] Site: [facility] INR POC 2.3 INR RANGE 2 - 3  Dietary changes: no    Health status changes: no    Bleeding/hemorrhagic complications: no    Recent/future hospitalizations: no    Any changes in medication regimen? no    Recent/future dental: no  Any missed doses?: no       Is patient compliant with meds? yes       Allergies: No Known Drug Allergies  Anticoagulation Management History:      The patient is taking warfarin and comes in today for a routine follow up visit.  Positive risk factors for bleeding include an age of 75 years or older, history of CVA/TIA, history of GI bleeding, and presence of serious comorbidities.  The bleeding index is 'high risk'.  Positive CHADS2 values include History of HTN, Age > 45 years old, History of Diabetes, and Prior Stroke/CVA/TIA.  The start date was 09/29/2002.  His last INR was 2.0.  Anticoagulation responsible provider: Shirlee Latch MD, Demondre Aguas.  INR POC: 2.3.  Cuvette Lot#: [id number].  Exp: 04/2011.    Anticoagulation Management Assessment/Plan:      The patient's current anticoagulation dose is Coumadin 3 mg tabs: Use as directed by Anticoagulation Clinic.  The target INR is 2 - 3.  The next INR is due 02/20/2010.  Anticoagulation instructions were given to patient/interpeuter.  Results were reviewed/authorized by Bethena Midget, RN, BSN.  He was notified by Bethena Midget, RN, BSN.         Prior Anticoagulation Instructions: INR 2.7 Continue  1 1/2 pills everyday. Recheck in 4 weeks.   Current Anticoagulation Instructions: INR 2.3 Continue 1.5 tablets everyday. Recheck in 4 weeks.

## 2010-08-01 NOTE — Medication Information (Signed)
Summary: rov/tm  Anticoagulant Therapy  Managed by: Bethena Midget, RN, BSN Referring MD: Olga Millers MD Supervising MD: Eden Emms MD, Theron Arista Indication 1: atrial fibrillation?? (ICD-427.31) Indication 2: reactivated 07/24/04..returning from [facility] (ICD-1/23/6 Lab Used: [facility] Site: [facility] INR POC 1.4 INR RANGE 2 - 3  Dietary changes: no    Health status changes: no    Bleeding/hemorrhagic complications: no    Recent/future hospitalizations: no    Any changes in medication regimen? no    Recent/future dental: no  Any missed doses?: no       Is patient compliant with meds? yes       Allergies: No Known Drug Allergies  Anticoagulation Management History:      The patient is taking warfarin and comes in today for a routine follow up visit.  Positive risk factors for bleeding include an age of 75 years or older, history of CVA/TIA, history of GI bleeding, and presence of serious comorbidities.  The bleeding index is 'high risk'.  Positive CHADS2 values include History of HTN, Age > 75 years old, History of Diabetes, and Prior Stroke/CVA/TIA.  The start date was 09/29/2002.  His last INR was 2.0.  Anticoagulation responsible provider: Eden Emms MD, Theron Arista.  INR POC: 1.4.  Cuvette Lot#: [id number].  Exp: 10/2010.    Anticoagulation Management Assessment/Plan:      The patient's current anticoagulation dose is Coumadin 3 mg tabs: Use as directed by Anticoagulation Clinic.  The target INR is 2 - 3.  The next INR is due 07/27/2009.  Anticoagulation instructions were given to patient/interpeuter.  Results were reviewed/authorized by Bethena Midget, RN, BSN.  He was notified by Bethena Midget, RN, BSN.         Prior Anticoagulation Instructions: INR 2.1 Continue 1 1/2  everyday except 1  on Tuesdays and Fridays. Recheck in 4 weeks.   Current Anticoagulation Instructions: INR 1.4 Today take 2 pills then change dose to 1 1/2 pills everyday except on Tuesdays take 1 pill. Recheck in 2 weeks.

## 2010-08-01 NOTE — Medication Information (Signed)
Summary: rov/ewj  Anticoagulant Therapy  Managed by: Cloyde Reams, RN, BSN Referring MD: Olga Millers MD Supervising MD: Tenny Craw MD, Gunnar Fusi Indication 1: atrial fibrillation?? (ICD-427.31) Indication 2: reactivated 07/24/04..returning from [facility] (ICD-1/23/6 Lab Used: [facility] Site: [facility] INR POC 2.5 INR RANGE 2 - 3  Dietary changes: no    Health status changes: no    Bleeding/hemorrhagic complications: no    Recent/future hospitalizations: no    Any changes in medication regimen? no    Recent/future dental: no  Any missed doses?: yes     Details: Missed 1 dose unsure when  Is patient compliant with meds? yes      Comments: Pt requests 4 week appt, aware of risks.    Allergies (verified): No Known Drug Allergies  Anticoagulation Management History:      The patient is taking warfarin and comes in today for a routine follow up visit.  Positive risk factors for bleeding include an age of 75 years or older, history of CVA/TIA, history of GI bleeding, and presence of serious comorbidities.  The bleeding index is 'high risk'.  Positive CHADS2 values include History of HTN, Age > 64 years old, History of Diabetes, and Prior Stroke/CVA/TIA.  The start date was 09/29/2002.  His last INR was 2.0.  Anticoagulation responsible provider: Tenny Craw MD, Gunnar Fusi.  INR POC: 2.5.  Cuvette Lot#: [id number].  Exp: 10/2010.    Anticoagulation Management Assessment/Plan:      The patient's current anticoagulation dose is Coumadin 3 mg tabs: Use as directed by Anticoagulation Clinic.  The target INR is 2 - 3.  The next INR is due 09/26/2009.  Anticoagulation instructions were given to patient/interpeuter.  Results were reviewed/authorized by Cloyde Reams, RN, BSN.  He was notified by Cloyde Reams RN.         Prior Anticoagulation Instructions: INR 1.4 Today take 2 pills then change dose to 1 1/2 pills everyday except on Tuesdays take 1 pill. Recheck in 2 weeks.   Current Anticoagulation  Instructions: INR 2.5  Continue on same dosage 1.5 tablets daily except 1 tablet on Tuesdays.   Recheck in 3 weeks.

## 2010-08-01 NOTE — Letter (Signed)
Summary: MEDICAL CERTIFICATION FOR DISABILITY EXCEPTIONS  MEDICAL CERTIFICATION FOR DISABILITY EXCEPTIONS   Imported By: Arta Bruce 11/21/2009 12:48:57  _____________________________________________________________________  External Attachment:    Type:   Image     Comment:   External Document

## 2010-08-01 NOTE — Assessment & Plan Note (Signed)
Summary: FU WITH DR MULBERRY IN 4 MONTHS//GK   Vital Signs:  Patient profile:   77 year old male Height:      70 inches Weight:      216.2 pounds Temp:     97.6 degrees F oral Pulse rate:   76 / minute Pulse rhythm:   regular Resp:     18 per minute BP sitting:   126 / 82  (left arm) Cuff size:   regular  Vitals Entered By: Michelle Nasuti (March 28, 2010 10:17 AM) CC: follow-up visit Pain Assessment Patient in pain? no      CBG Result 156 CBG Device ID A [id number]   CC:  follow-up visit.  History of Present Illness: Pt. here with daughter, who interprets.  1.  Hypertension:  Dr. Ludwig Clarks office performed Echo:  showed LAE and some diastolic dysfunction.  Pt. appears to be taking medications regularly now and bp has been well controlled.  Is on Beta blocker.  Question of aortic valve size deferred to Dr. Jens Som.    2.  DM:  Last A1C was a bit high at 7.9%.  Has not checked sugars this month.  Generally, just checks 7-10 times per month.  All of sugars in past year that he has checked range from low 100s to upper 100s.  Goes once yearly for diabetic eye check.  Daughter cannot recall name of physician--on [facility].  Reportedly, had no diabetic changes.  No numbness or tingling in hands or feet.  Walks every day for 2 hours total.  If too hot, does not walk.  3.  Dyslipidemia:    Allergies: No Known Drug Allergies  Physical Exam  General:  NAD Lungs:  Normal respiratory effort, chest expands symmetrically. Lungs are clear to auscultation, no crackles or wheezes. Heart:  Normal rate and regular rhythm. S1 and S2 normal without gallop, murmur, click, rub or other extra sounds.  Radial pulses normal and equal. Extremities:  No edema.   Impression & Recommendations:  Problem # 1:  DYSLIPIDEMIA (ICD-272.4) Switch to Pravastatin from Simvastatin with concerns for interactions Repeat labs in 8 weeks after switch. His updated medication list for this problem includes:  Pravastatin Sodium 40 Mg Tabs (Pravastatin sodium) .Marland Kitchen... 1 tab by mouth daily with meal    Niaspan 1000 Mg Tbcr (Niacin (antihyperlipidemic)) .Marland Kitchen... Take 2 tablets at bedtime after a low fat snack.  take baby aspirin 30 minutes before niaspan  Problem # 2:  HYPERTENSION (ICD-401.9) diastolic dysfunction on echo--bp good past couple of visits. On beta blocker. His updated medication list for this problem includes:    Furosemide 40 Mg Tabs (Furosemide) .Marland Kitchen... Take 1/2  tablet by mouth once a day    Enalapril Maleate 20 Mg Tabs (Enalapril maleate) ..... One tablet by mouth daily    Toprol Xl 100 Mg Xr24h-tab (Metoprolol succinate) .Marland Kitchen... Take one tablet by mouth every day  Problem # 3:  DIABETES MELLITUS, TYPE II, ON INSULIN, UNCONTROLLED (ICD-250.02) A1C pending Getting regular eye checks Encourage flu shot at area pharmacy--should be covered by Medicare. His updated medication list for this problem includes:    Aspirin 81 Mg Tabs (Aspirin) .Marland Kitchen... Take 1 tablet by mouth once a day    Metformin Hcl 500 Mg Tabs (Metformin hcl) .Marland Kitchen... 1 tab by mouth two times a day with meals    Lantus Solostar 100 Unit/ml Soln (Insulin glargine) ..... Inject 74  units subcutaneously once daily  and pen needles    Enalapril Maleate  20 Mg Tabs (Enalapril maleate) ..... One tablet by mouth daily  Orders: Capillary Blood Glucose/CBG (82948) Hemoglobin A1C (83036)  Complete Medication List: 1)  Pravastatin Sodium 40 Mg Tabs (Pravastatin sodium) .Marland Kitchen.. 1 tab by mouth daily with meal 2)  Aspirin 81 Mg Tabs (Aspirin) .... Take 1 tablet by mouth once a day 3)  Furosemide 40 Mg Tabs (Furosemide) .... Take 1/2  tablet by mouth once a day 4)  Niaspan 1000 Mg Tbcr (Niacin (antihyperlipidemic)) .... Take 2 tablets at bedtime after a low fat snack.  take baby aspirin 30 minutes before niaspan 5)  Metformin Hcl 500 Mg Tabs (Metformin hcl) .Marland Kitchen.. 1 tab by mouth two times a day with meals 6)  Lantus Solostar 100 Unit/ml Soln  (Insulin glargine) .... Inject 74  units subcutaneously once daily  and pen needles 7)  Enalapril Maleate 20 Mg Tabs (Enalapril maleate) .... One tablet by mouth daily 8)  Toprol Xl 100 Mg Xr24h-tab (Metoprolol succinate) .... Take one tablet by mouth every day 9)  Acucheck Aviva Strips  .... To check two times a day iddm 10)  Accu-chek Multiclix Lancets Misc (Lancets) .... Use to check blood sugar twice daily dx 250.00 11)  Coumadin 3 Mg Tabs (Warfarin sodium) .... Use as directed by anticoagulation clinic 12)  Istalol 0.5 % Soln (Timolol maleate) .Marland Kitchen.. 1 drop both eyes every morning dr. Earlene Plater 13)  Xalatan 0.005 % Soln (Latanoprost) .Marland Kitchen.. 1 drop both eyes every evening. dr. Earlene Plater  Patient Instructions: 1)  Fasting lab appt. in 8 weeks for FLP and liver profile--change to Pravastatin. 2)  Follow up with Dr. Delrae Alfred in 4 months --DM, htn. Prescriptions: FUROSEMIDE 40 MG  TABS (FUROSEMIDE) Take 1/2  tablet by mouth once a day  #15 Tablet x 11   Entered and Authorized by:   Julieanne Manson MD   Signed by:   Julieanne Manson MD on 03/28/2010   Method used:   Electronically to        [facility]  [phone]* (retail)       [address] [place], [place]  [address]       Ph: [phone] or [phone]       Fax: [phone]   RxID:   [id number] ACCU-CHEK MULTICLIX LANCETS  MISC (LANCETS) Use to check blood sugar twice daily Dx 250.00  #100 x 11   Entered and Authorized by:   Julieanne Manson MD   Signed by:   Julieanne Manson MD on 03/28/2010   Method used:   Electronically to        [facility]  [phone]* (retail)       [address] [place], [place]  [address]       Ph: [phone] or [phone]       Fax: [phone]   RxID:   [id number] TOPROL XL 100 MG XR24H-TAB (METOPROLOL SUCCINATE) take one tablet by mouth every day  #30 x 11   Entered and Authorized by:   Julieanne Manson MD   Signed by:   Julieanne Manson MD on 03/28/2010   Method  used:   Electronically to        [facility]  [phone]* (retail)       [address] [place], [place]  [address]       Ph: [phone] or [phone]       Fax: [phone]  RxID:   [id number] ENALAPRIL MALEATE 20 MG  TABS (ENALAPRIL MALEATE) One tablet by mouth daily  #30 Tablet x 11   Entered and Authorized by:   Julieanne Manson MD   Signed by:   Julieanne Manson MD on 03/28/2010   Method used:   Electronically to        [facility]  [phone]* (retail)       [address] [place], [place]  [address]       Ph: [phone] or [phone]       Fax: [phone]   RxID:   [id number] LANTUS SOLOSTAR 100 UNIT/ML  SOLN (INSULIN GLARGINE) Inject 74  units Subcutaneously once daily  and pen needles  #1 month x 11   Entered and Authorized by:   Julieanne Manson MD   Signed by:   Julieanne Manson MD on 03/28/2010   Method used:   Electronically to        [facility]  [phone]* (retail)       [address] [place], [place]  [address]       Ph: [phone] or [phone]       Fax: [phone]   RxID:   [id number] NIASPAN 1000 MG  TBCR (NIACIN (ANTIHYPERLIPIDEMIC)) Take 2 tablets at bedtime after a low fat snack.  Take baby aspirin 30 minutes before niaspan  #60 Tablet x 11   Entered and Authorized by:   Julieanne Manson MD   Signed by:   Julieanne Manson MD on 03/28/2010   Method used:   Electronically to        [facility]  [phone]* (retail)       [address] [place], [place]  [address]       Ph: [phone] or [phone]       Fax: [phone]   RxID:   [id number] METFORMIN HCL 500 MG TABS (METFORMIN HCL) 1 tab by mouth two times a day with meals  #60 x 11   Entered and Authorized by:   Julieanne Manson MD   Signed by:   Julieanne Manson MD on 03/28/2010   Method used:   Electronically to        [facility]  [phone]* (retail)       [address] [place], [place]   [address]       Ph: [phone] or [phone]       Fax: [phone]   RxID:   [id number] PRAVASTATIN SODIUM 40 MG TABS (PRAVASTATIN SODIUM) 1 tab by mouth daily with meal  #30 x 11   Entered and Authorized by:   Julieanne Manson MD   Signed by:   Julieanne Manson MD on 03/28/2010   Method used:   Electronically to        [facility]  [phone]* (retail)       [address] [place], [place]  [address]       Ph: [phone] or [phone]       Fax: [phone]   RxID:   [id number]   Laboratory Results   Blood Tests     HGBA1C: 6.8%   (Normal Range: Non-Diabetic - 3-6%   Control Diabetic - 6-8%) CBG Random:: 156

## 2010-08-01 NOTE — Assessment & Plan Note (Signed)
Summary: per check out/sf    CC:  no complaints.  History of Present Illness: Harold Jones is a  gentleman who has a history of aortic valve disease status post previous bioprosthetic valve as well as replacement of thoracic aneurysm, CAD (s/p CABG), paroxysmal atrial fibrillation, hypertension, diabetes, and hyperlipidemia.   I last saw him in July of 2010. Last Myoview on December 30, 2007.  His ejection fraction was 60%.  There is mild thinning of the inferior wall, but no ischemia was noted. Last echocardiogram in July of 2010 showed normal LV function, AV prosthesis appears to open well. Peak and mean gradients through the valve are 34 and 17 mm Hg respectively. There is a small perivalular leak anteriorly with mild regurgitation. Mild mitral regurgitation. Carotid Dopplers in July 2010 showed no stenosis bilaterally. Since I last saw him the patient denies any dyspnea on exertion, orthopnea, PND, pedal edema, palpitations, syncope or chest pain. No bleeding.   Current Medications (verified): 1)  Simvastatin 40 Mg  Tabs (Simvastatin) .... Take 1 Tablet By Mouth Once A Day 2)  Aspirin 81 Mg  Tabs (Aspirin) .... Take 1 Tablet By Mouth Once A Day 3)  Furosemide 40 Mg  Tabs (Furosemide) .... Take 1/2  Tablet By Mouth Once A Day 4)  Niaspan 1000 Mg  Tbcr (Niacin (Antihyperlipidemic)) .... Take 2 Tablets At Bedtime After A Low Fat Snack.  Take Baby Aspirin 30 Minutes Before Niaspan 5)  Metformin Hcl 500 Mg Tabs (Metformin Hcl) .Marland Kitchen.. 1 Tab By Mouth Two Times A Day With Meals 6)  Lantus Solostar 100 Unit/ml  Soln (Insulin Glargine) .... Inject 74  Units Subcutaneously Once Daily  and Pen Needles 7)  Enalapril Maleate 20 Mg  Tabs (Enalapril Maleate) .... One Tablet By Mouth Daily 8)  Toprol Xl 100 Mg Xr24h-Tab (Metoprolol Succinate) .... Take One Tablet By Mouth Every Day *crenshaw,b S 9)  Acucheck Aviva Strips .... To Check Two Times A Day Iddm 10)  Accu-Chek Multiclix Lancets  Misc (Lancets) .... Use  To Check Blood Sugar Twice Daily Dx 250.00 11)  Coumadin 3 Mg Tabs (Warfarin Sodium) .... Use As Directed By Anticoagulation Clinic 12)  Istalol 0.5 % Soln (Timolol Maleate) .Marland Kitchen.. 1 Drop Both Eyes Every Morning Dr. Earlene Plater 13)  Xalatan 0.005 % Soln (Latanoprost) .Marland Kitchen.. 1 Drop Both Eyes Every Evening. Dr. Earlene Plater  Allergies: No Known Drug Allergies  Past History:  Past Medical History: Reviewed history from 09/20/2008 and no changes required. Hx of childhood TB "cleared" [facility] 1958 unexplained fever ASCENDING AORTIC ANEURYSM (ICD-441.2) AORTIC VALVE REPLACEMENT, HX OF (ICD-V43.3) CVA (ICD-434.91) COUMADIN THERAPY (ICD-V58.61) DYSLIPIDEMIA (ICD-272.4) CORONARY ARTERY DISEASE (ICD-414.00) HYPERTENSION (ICD-401.9) FIBRILLATION, ATRIAL (ICD-427.31) GLAUCOMA (ICD-365.9) DM (ICD-250.00)  Past Surgical History: Hx of aortic aneursym repair and aortic valve replacement 2005 L cataract surgery 1997 R cataract surgery 1998 unknown abdominal surgery Cholecystectomy CABG  Social History: Reviewed history from 07/22/2009 and no changes required. Retired--[employer] Married...wife is also patient,Harold Jones Tobacco Use - No.  Alcohol Use - no Drug Use - no  Review of Systems       no fevers or chills, productive cough, hemoptysis, dysphasia, odynophagia, melena, hematochezia, dysuria, hematuria, rash, seizure activity, orthopnea, PND, pedal edema, claudication. Remaining systems are negative.   Vital Signs:  Patient profile:   77 year old male Height:      70 inches Weight:      216 pounds BMI:     31.10 Pulse rate:   55 / minute BP  sitting:   115 / 70  (left arm)  Vitals Entered By: Kem Parkinson (January 18, 2010 10:26 AM)  Physical Exam  General:  Well-developed well-nourished in no acute distress.  Skin is warm and dry.  HEENT is normal.  Neck is supple. No thyromegaly.  Chest is clear to auscultation with normal expansion.  Cardiovascular exam is regular rate and rhythm.  2/6 systolic murmur left sternal border. No diastolic murmur. Abdominal exam nontender or distended. No masses palpated. Extremities show no edema. neuro grossly intact    EKG  Procedure date:  01/18/2010  Findings:      Atrial fibrillation at a rate of 55. Left anterior fascicular block. Left ventricular hypertrophy.  Impression & Recommendations:  Problem # 1:  ASCENDING AORTIC ANEURYSM (ICD-441.2) Previous review with Dr. Cornelius Moras. No need for further CAT scans at his point.  Problem # 2:  AORTIC VALVE REPLACEMENT, HX OF (ICD-V43.3) Continued SBE prophylaxis. Schedule followup echocardiogram. Orders: EKG w/ Interpretation (93000) Echocardiogram (Echo)  Problem # 3:  COUMADIN THERAPY (ICD-V58.61) Goal INR 2-3. Hemoglobin monitored by his primary care per his daughter.  Problem # 4:  DYSLIPIDEMIA (ICD-272.4) Continue present medications. Lipids and liver monitored by primary care. His updated medication list for this problem includes:    Simvastatin 40 Mg Tabs (Simvastatin) .Marland Kitchen... Take 1 tablet by mouth once a day    Niaspan 1000 Mg Tbcr (Niacin (antihyperlipidemic)) .Marland Kitchen... Take 2 tablets at bedtime after a low fat snack.  take baby aspirin 30 minutes before niaspan  Problem # 5:  CORONARY ARTERY DISEASE (ICD-414.00) Continue aspirin, ACE inhibitor, beta blocker and statin. His updated medication list for this problem includes:    Aspirin 81 Mg Tabs (Aspirin) .Marland Kitchen... Take 1 tablet by mouth once a day    Enalapril Maleate 20 Mg Tabs (Enalapril maleate) ..... One tablet by mouth daily    Toprol Xl 100 Mg Xr24h-tab (Metoprolol succinate) .Marland Kitchen... Take one tablet by mouth every day *crenshaw,b s    Coumadin 3 Mg Tabs (Warfarin sodium) ..... Use as directed by anticoagulation clinic  Orders: EKG w/ Interpretation (93000)  Problem # 6:  HYPERTENSION (ICD-401.9) Blood pressure controlled on present medications. Will continue. Renal function and potassium monitored by primary care. His  updated medication list for this problem includes:    Aspirin 81 Mg Tabs (Aspirin) .Marland Kitchen... Take 1 tablet by mouth once a day    Furosemide 40 Mg Tabs (Furosemide) .Marland Kitchen... Take 1/2  tablet by mouth once a day    Enalapril Maleate 20 Mg Tabs (Enalapril maleate) ..... One tablet by mouth daily    Toprol Xl 100 Mg Xr24h-tab (Metoprolol succinate) .Marland Kitchen... Take one tablet by mouth every day *crenshaw,b s  Problem # 7:  FIBRILLATION, ATRIAL (ICD-427.31) Patient remains in atrial fibrillation. Continue beta blocker and Coumadin. His updated medication list for this problem includes:    Aspirin 81 Mg Tabs (Aspirin) .Marland Kitchen... Take 1 tablet by mouth once a day    Toprol Xl 100 Mg Xr24h-tab (Metoprolol succinate) .Marland Kitchen... Take one tablet by mouth every day *crenshaw,b s    Coumadin 3 Mg Tabs (Warfarin sodium) ..... Use as directed by anticoagulation clinic  Problem # 8:  DIABETES MELLITUS, TYPE II, ON INSULIN, UNCONTROLLED (ICD-250.02) Management per primary care. His updated medication list for this problem includes:    Aspirin 81 Mg Tabs (Aspirin) .Marland Kitchen... Take 1 tablet by mouth once a day    Metformin Hcl 500 Mg Tabs (Metformin hcl) .Marland Kitchen... 1 tab by mouth  two times a day with meals    Lantus Solostar 100 Unit/ml Soln (Insulin glargine) ..... Inject 74  units subcutaneously once daily  and pen needles    Enalapril Maleate 20 Mg Tabs (Enalapril maleate) ..... One tablet by mouth daily  Patient Instructions: 1)  Your physician recommends that you schedule a follow-up appointment in: 1 year with Dr. Jens Som 2)  Your physician recommends that you continue on your current medications as directed. Please refer to the Current Medication list given to you today. 3)  Your physician has requested that you have an echocardiogram.  Echocardiography is a painless test that uses sound waves to create images of your heart. It provides your doctor with information about the size and shape of your heart and how well your heart's  chambers and valves are working.  This procedure takes approximately one hour. There are no restrictions for this procedure.

## 2010-08-01 NOTE — Letter (Signed)
Summary: Lipid Letter  [facility]  [address] [place], [place] [address]   Phone: [phone]  Fax: [phone]    08/04/2009  Harold Jones [address] [place], [place]  [address]  Dear Cledis:  We have carefully reviewed your last lipid profile from 07/22/2009 and the results are noted below with a summary of recommendations for lipid management.    Cholesterol:       135     Goal: <200   HDL "good" Cholesterol:   35     Goal: >45   LDL "bad" Cholesterol:   74     Goal: <70   Triglycerides:       132     Goal: <150    Almost at goal with cholesterol--would like to see the good part up a bit more and the LDL a bit lower.  Rest of labs were ok.    TLC Diet (Therapeutic Lifestyle Change): Saturated Fats & Transfatty acids should be kept < 7% of total calories ***Reduce Saturated Fats Polyunstaurated Fat can be up to 10% of total calories Monounsaturated Fat Fat can be up to 20% of total calories Total Fat should be no greater than 25-35% of total calories Carbohydrates should be 50-60% of total calories Protein should be approximately 15% of total calories Fiber should be at least 20-30 grams a day ***Increased fiber may help lower LDL Total Cholesterol should be < 200mg /day Consider adding plant stanol/sterols to diet (example: Benacol spread) ***A higher intake of unsaturated fat may reduce Triglycerides and Increase HDL    Adjunctive Measures (may lower LIPIDS and reduce risk of Heart Attack) include: Aerobic Exercise (20-30 minutes 3-4 times a week) Limit Alcohol Consumption Weight Reduction Aspirin 75-81 mg a day by mouth (if not allergic or contraindicated) Dietary Fiber 20-30 grams a day by mouth     Current Medications: 1)    Simvastatin 40 Mg  Tabs (Simvastatin) .... Take 1 tablet by mouth once a day 2)    Aspirin 81 Mg  Tabs (Aspirin) .... Take 1 tablet by mouth once a day 3)    Furosemide 40 Mg  Tabs (Furosemide) .... Take 1/2   tablet by mouth once a day 4)    Niaspan 1000 Mg  Tbcr (Niacin (antihyperlipidemic)) .... Take 2 tablets at bedtime after a low fat snack.  take baby aspirin 30 minutes before niaspan 5)    Metformin Hcl 500 Mg Tabs (Metformin hcl) .Marland Kitchen.. 1 tab by mouth two times a day with meals 6)    Lantus Solostar 100 Unit/ml  Soln (Insulin glargine) .... Inject 74  units subcutaneously once daily  and pen needles 7)    Enalapril Maleate 20 Mg  Tabs (Enalapril maleate) .... One tablet by mouth daily 8)    Toprol Xl 100 Mg Xr24h-tab (Metoprolol succinate) .... Take one tablet by mouth every day *crenshaw,b s 9)    Acucheck Aviva Strips  .... To check two times a day iddm 10)    Accu-chek Multiclix Lancets  Misc (Lancets) .... Use to check blood sugar twice daily dx 250.00 11)    Coumadin 3 Mg Tabs (Warfarin sodium) .... Use as directed by anticoagulation clinic 12)    Istalol 0.5 % Soln (Timolol maleate) .Marland Kitchen.. 1 drop both eyes every morning dr. Earlene Plater 13)    Xalatan 0.005 % Soln (Latanoprost) .Marland Kitchen.. 1 drop both eyes every evening. dr. Earlene Plater  If you have any questions, please call. We appreciate being able  to work with you.   Sincerely,    [facility] Beverley Fiedler MD

## 2010-08-01 NOTE — Progress Notes (Signed)
   Phone Note Outgoing Call   Summary of Call: Called and spoke with Granddaughter, Thurmond Butts gathered info that her grandfather had 4 years of education.   Let her know family could pick up paperwork and take to Ms. Meredith DiMattina on MOnday. Left message with Ms. DiMattina at phone [phone] to let her know to call me on my cell if any questions or recommendations for clarity in the paperwork. Initial call taken by: Julieanne Manson MD,  September 10, 2009 2:09 PM

## 2010-08-01 NOTE — Medication Information (Signed)
Summary: rov/mw  Anticoagulant Therapy  Managed by: Bethena Midget, RN, BSN Referring MD: Olga Millers MD Supervising MD: Eden Emms MD, Theron Arista Indication 1: atrial fibrillation (ICD-427.31) Indication 2: reactivated 07/24/04..returning from [facility] (ICD-1/23/6 Lab Used: [facility] Site: [facility] INR POC 2.9 INR RANGE 2 - 3  Dietary changes: no    Health status changes: no    Bleeding/hemorrhagic complications: no    Recent/future hospitalizations: no    Any changes in medication regimen? no    Recent/future dental: no  Any missed doses?: no       Is patient compliant with meds? yes       Allergies: No Known Drug Allergies  Anticoagulation Management History:      The patient is taking warfarin and comes in today for a routine follow up visit.  Positive risk factors for bleeding include an age of 65 years or older, history of CVA/TIA, history of GI bleeding, and presence of serious comorbidities.  The bleeding index is 'high risk'.  Positive CHADS2 values include History of HTN, Age > 78 years old, History of Diabetes, and Prior Stroke/CVA/TIA.  The start date was 09/29/2002.  His last INR was 2.0.  Anticoagulation responsible provider: Eden Emms MD, Theron Arista.  INR POC: 2.9.  Cuvette Lot#: [id number].  Exp: 06/2011.    Anticoagulation Management Assessment/Plan:      The patient's current anticoagulation dose is Coumadin 3 mg tabs: Use as directed by Anticoagulation Clinic.  The target INR is 2 - 3.  The next INR is due 07/04/2010.  Anticoagulation instructions were given to patient/interpeuter.  Results were reviewed/authorized by Bethena Midget, RN, BSN.  He was notified by Bethena Midget, RN, BSN.         Prior Anticoagulation Instructions: INR 2.1 Continue taking 1 tablet on wednesday and saturday. And 1 1/2 tablets all other days of the week. Recheck in 4 weeks.  Current Anticoagulation Instructions: INR 2.9 Continue 1.5 pills everyday except 1 pill on Wednesdays and Saturdays.  REcheck in 4 weeks.

## 2010-08-01 NOTE — Medication Information (Signed)
Summary: rov/cs   Anticoagulant Therapy  Managed by: Lyna Poser, PharmD Referring MD: Olga Millers MD Supervising MD: Johney Frame MD, Fayrene Fearing Indication 1: atrial fibrillation (ICD-427.31) Indication 2: reactivated 07/24/04..returning from [facility] (ICD-1/23/6 Lab Used: [facility] Site: [facility] INR POC 2.1 INR RANGE 2 - 3  Dietary changes: no    Health status changes: no    Bleeding/hemorrhagic complications: no    Recent/future hospitalizations: no    Any changes in medication regimen? no    Recent/future dental: no  Any missed doses?: no       Is patient compliant with meds? yes       Allergies: No Known Drug Allergies  Anticoagulation Management History:      The patient is taking warfarin and comes in today for a routine follow up visit.  Positive risk factors for bleeding include an age of 75 years or older, history of CVA/TIA, history of GI bleeding, and presence of serious comorbidities.  The bleeding index is 'high risk'.  Positive CHADS2 values include History of HTN, Age > 44 years old, History of Diabetes, and Prior Stroke/CVA/TIA.  The start date was 09/29/2002.  His last INR was 2.0.  Anticoagulation responsible Rajinder Mesick: Allred MD, Fayrene Fearing.  INR POC: 2.1.  Cuvette Lot#: [id number].  Exp: 05/2011.    Anticoagulation Management Assessment/Plan:      The patient's current anticoagulation dose is Coumadin 3 mg tabs: Use as directed by Anticoagulation Clinic.  The target INR is 2 - 3.  The next INR is due 06/05/2010.  Anticoagulation instructions were given to patient/interpeuter.  Results were reviewed/authorized by Lyna Poser, PharmD.         Prior Anticoagulation Instructions: INR: 4.5  Skip today's dose.  Then take 1 tablet on Wednesday and Saturday, and 1 and 1/2 tablets all other days of the week. Return on 05/08/2010.    Current Anticoagulation Instructions: INR 2.1 Continue taking 1 tablet on wednesday and saturday. And 1 1/2 tablets all other days of the  week. Recheck in 4 weeks.

## 2010-08-01 NOTE — Medication Information (Signed)
Summary: rov/sp  Anticoagulant Therapy  Managed by: Eda Keys, PharmD Referring MD: Olga Millers MD Supervising MD: Myrtis Ser MD, Tinnie Gens Indication 1: atrial fibrillation?? (ICD-427.31) Indication 2: reactivated 07/24/04..returning from [facility] (ICD-1/23/6 Lab Used: [facility] Site: [facility] INR RANGE 2 - 3  Dietary changes: no    Health status changes: no    Bleeding/hemorrhagic complications: no    Recent/future hospitalizations: no    Any changes in medication regimen? no    Recent/future dental: no  Any missed doses?: no       Is patient compliant with meds? yes       Allergies: No Known Drug Allergies  Anticoagulation Management History:      The patient is taking warfarin and comes in today for a routine follow up visit.  Positive risk factors for bleeding include an age of 75 years or older, history of CVA/TIA, history of GI bleeding, and presence of serious comorbidities.  The bleeding index is 'high risk'.  Positive CHADS2 values include History of HTN, Age > 57 years old, History of Diabetes, and Prior Stroke/CVA/TIA.  The start date was 09/29/2002.  His last INR was 2.0.  Anticoagulation responsible provider: Myrtis Ser MD, Tinnie Gens.  Cuvette Lot#: [id number].  Exp: 01/2011.    Anticoagulation Management Assessment/Plan:      The patient's current anticoagulation dose is Coumadin 3 mg tabs: Use as directed by Anticoagulation Clinic.  The target INR is 2 - 3.  The next INR is due 12/19/2009.  Anticoagulation instructions were given to patient/interpeuter.  Results were reviewed/authorized by Eda Keys, PharmD.  He was notified by Eda Keys.         Prior Anticoagulation Instructions: INR 1.9  Increase dose to 1 1/2 tablets every day The patient is to hold four doses of coumadin.  The dosage to be resumed includes:   Current Anticoagulation Instructions: INR 3.1  Take 1 tablet today.  Then return to normal dosing schedule of 1.5 tablets every day.  Return  to clinic in 4 weeks.

## 2010-08-01 NOTE — Medication Information (Signed)
Summary: rov/eac  Anticoagulant Therapy  Managed by: Bethena Midget, RN, BSN Referring MD: Olga Millers MD Supervising MD: Myrtis Ser MD, Tinnie Gens Indication 1: atrial fibrillation?? (ICD-427.31) Indication 2: reactivated 07/24/04..returning from [facility] (ICD-1/23/6 Lab Used: [facility] Site: [facility] INR POC 2.7 INR RANGE 2 - 3  Dietary changes: no    Health status changes: no    Bleeding/hemorrhagic complications: no    Recent/future hospitalizations: no    Any changes in medication regimen? no    Recent/future dental: no  Any missed doses?: no       Is patient compliant with meds? yes       Allergies: No Known Drug Allergies  Anticoagulation Management History:      The patient is taking warfarin and comes in today for a routine follow up visit.  Positive risk factors for bleeding include an age of 75 years or older, history of CVA/TIA, history of GI bleeding, and presence of serious comorbidities.  The bleeding index is 'high risk'.  Positive CHADS2 values include History of HTN, Age > 15 years old, History of Diabetes, and Prior Stroke/CVA/TIA.  The start date was 09/29/2002.  His last INR was 2.0.  Anticoagulation responsible provider: Myrtis Ser MD, Tinnie Gens.  INR POC: 2.7.  Cuvette Lot#: [id number].  Exp: 01/2011.    Anticoagulation Management Assessment/Plan:      The patient's current anticoagulation dose is Coumadin 3 mg tabs: Use as directed by Anticoagulation Clinic.  The target INR is 2 - 3.  The next INR is due 01/20/2010.  Anticoagulation instructions were given to patient/interpeuter.  Results were reviewed/authorized by Bethena Midget, RN, BSN.  He was notified by Bethena Midget, RN, BSN.         Prior Anticoagulation Instructions: INR 3.1  Take 1 tablet today.  Then return to normal dosing schedule of 1.5 tablets every day.  Return to clinic in 4 weeks.      Current Anticoagulation Instructions: INR 2.7 Continue  1 1/2 pills everyday. Recheck in 4 weeks.

## 2010-08-01 NOTE — Medication Information (Signed)
Summary: rov/ewj  Anticoagulant Therapy  Managed by: Weston Brass, PharmD Referring MD: Olga Millers MD Supervising MD: Excell Seltzer MD, Casimiro Needle Indication 1: atrial fibrillation (ICD-427.31) Indication 2: reactivated 07/24/04..returning from [facility] (ICD-1/23/6 Lab Used: [facility] Site: [facility] INR POC 3.3 INR RANGE 2 - 3  Dietary changes: no    Health status changes: no    Bleeding/hemorrhagic complications: no    Recent/future hospitalizations: no    Any changes in medication regimen? no    Recent/future dental: no  Any missed doses?: no       Is patient compliant with meds? yes       Allergies: No Known Drug Allergies  Anticoagulation Management History:      The patient is taking warfarin and comes in today for a routine follow up visit.  Positive risk factors for bleeding include an age of 75 years or older, history of CVA/TIA, history of GI bleeding, and presence of serious comorbidities.  The bleeding index is 'high risk'.  Positive CHADS2 values include History of HTN, Age > 75 years old, History of Diabetes, and Prior Stroke/CVA/TIA.  The start date was 09/29/2002.  His last INR was 2.0.  Anticoagulation responsible provider: Excell Seltzer MD, Casimiro Needle.  INR POC: 3.3.  Cuvette Lot#: [id number].  Exp: 04/2011.    Anticoagulation Management Assessment/Plan:      The patient's current anticoagulation dose is Coumadin 3 mg tabs: Use as directed by Anticoagulation Clinic.  The target INR is 2 - 3.  The next INR is due 04/18/2010.  Anticoagulation instructions were given to patient/interpeuter.  Results were reviewed/authorized by Weston Brass, PharmD.  He was notified by Harrel Carina, PharmD candidate.         Prior Anticoagulation Instructions: INR 2.7  Continue on same dosage 1.5 tablets daily.  Recheck in 4 weeks.    Current Anticoagulation Instructions: INR 3.3  Today, take only 1/2 tablet. Then resume regular schedule of 1 1/2 tablets everyday. Re-check INR in 4 weeks.

## 2010-08-01 NOTE — Letter (Signed)
Summary: Handout Printed  Printed Handout:  - Coumadin Instructions-w/out Meds 

## 2010-08-01 NOTE — Progress Notes (Signed)
Summary: Overdue for coumadin f/u  Phone Note Outgoing Call   Call placed by: Cloyde Reams RN,  August 24, 2009 3:09 PM Call placed to: granddaughter Summary of Call: Called spoke with pt's granddaughter advised pt is past due for coumadin follow-up.  She is going to call her mother to brings pt to his appts and find out when she can bring him and call us back for appt date and time.   Initial call taken by: Cloyde Reams RN,  August 24, 2009 3:11 PM

## 2010-08-01 NOTE — Assessment & Plan Note (Signed)
Summary: REDO EVALUATION//GK   Vital Signs:  Patient profile:   77 year old male Weight:      221 pounds Temp:     98.0 degrees F Pulse rate:   79 / minute Pulse rhythm:   regular BP sitting:   135 / 81  (left arm) Cuff size:   large  Vitals Entered By: Vesta Mixer CMA (September 09, 2009 11:30 AM) CC: Pt is trying to get [place] citizinship, but due to stroke will not be able to learn english in order to qualify for citizinship and need some paper work filled out.  States Dr. Barbaraann Barthel wrote a letter in 2006 in regards to this. Is Patient Diabetic? Yes Pain Assessment Patient in pain? no      CBG Result 277  Does patient need assistance? Ambulation Normal   CC:  Pt is trying to get [place] citizinship and but due to stroke will not be able to learn english in order to qualify for citizinship and need some paper work filled out.  States Dr. Barbaraann Barthel wrote a letter in 2006 in regards to this..  History of Present Illness: 1.  Harold Jones--works at [employer] in [place].  Helping pt get his citizenship.  Need to fill out form regarding his disabilities that make it difficult for him to learn English to get citizenship.  Pt. with history of stroke in 2005.  Followed by another physician until a little less than one year ago.  Much of this history in paper chart.  Pt. has had obvious difficulties with memory in short time I have been following him and this apparently became a problem after his stroke.  Granddaughter, Jonna Clark, who is acting as Nurse, learning disability today states his memory from her perspective was definitively affected after stroke.  No definite difficulties performing ADLs other than a big problem remembering to take meds.  Getting meals, washing clothes, etc.  has always been taken care of by wife or male member of family.  Pt. able to groom/bathe/dress/toilet on own.  Allergies: No Known Drug Allergies  Physical Exam  General:  NAD, smiling. Eyes:  No corneal or  conjunctival inflammation noted. EOMI. Perrla. Funduscopic exam benign, without hemorrhages, exudates or papilledema. Vision grossly normal. Ears:  External ear exam shows no significant lesions or deformities.  Otoscopic examination reveals clear canals, tympanic membranes are intact bilaterally without bulging, retraction, inflammation or discharge. Hearing is grossly normal bilaterally. Lungs:  Normal respiratory effort, chest expands symmetrically. Lungs are clear to auscultation, no crackles or wheezes. Heart:  Irregularly, irregular Neurologic:  Alert and oriented to person, place, but unable to give complete date.  MMSE raw score of 18. 5 % score( minimal) for man of 81 would be 20.  Raw score supports moderate cognitive/memory deficit. 4+/5 strength of left upper and lower extremities.  5/5 throughout on right.  No left facial droop or weakness of palate or tongue noted.   DTRs symmetrical and normal and Romberg negative.  Rapid alternating motions of hands normal.   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  Fifth percentile score for a man of his age and education (4 years) is 20--basically the minimum score. His updated medication list for this problem includes:    Aspirin  81 Mg Tabs (Aspirin) .Marland Kitchen... Take 1 tablet by mouth once a day    Coumadin 3 Mg Tabs (Warfarin sodium) ..... Use as directed by anticoagulation clinic  Complete Medication List: 1)  Simvastatin 40 Mg Tabs (Simvastatin) .... Take 1 tablet by mouth once a day 2)  Aspirin 81 Mg Tabs (Aspirin) .... Take 1 tablet by mouth once a day 3)  Furosemide 40 Mg Tabs (Furosemide) .... Take 1/2  tablet by mouth once a day 4)  Niaspan 1000 Mg Tbcr (Niacin  (antihyperlipidemic)) .... Take 2 tablets at bedtime after a low fat snack.  take baby aspirin 30 minutes before niaspan 5)  Metformin Hcl 500 Mg Tabs (Metformin hcl) .Marland Kitchen.. 1 tab by mouth two times a day with meals 6)  Lantus Solostar 100 Unit/ml Soln (Insulin glargine) .... Inject 74  units subcutaneously once daily  and pen needles 7)  Enalapril Maleate 20 Mg Tabs (Enalapril maleate) .... One tablet by mouth daily 8)  Toprol Xl 100 Mg Xr24h-tab (Metoprolol succinate) .... Take one tablet by mouth every day *crenshaw,b s 9)  Acucheck Aviva Strips  .... To check two times a day iddm 10)  Accu-chek Multiclix Lancets Misc (Lancets) .... Use to check blood sugar twice daily dx 250.00 11)  Coumadin 3 Mg Tabs (Warfarin sodium) .... Use as directed by anticoagulation clinic 12)  Istalol 0.5 % Soln (Timolol maleate) .Marland Kitchen.. 1 drop both eyes every morning dr. Earlene Plater 13)  Xalatan 0.005 % Soln (Latanoprost) .Marland Kitchen.. 1 drop both eyes every evening. dr. Earlene Plater  Other Orders: Capillary Blood Glucose/CBG [phone])  Patient Instructions: 1)  Release of information for me to fax info to Tanda Rockers at [facility] of [address]

## 2010-08-01 NOTE — Miscellaneous (Signed)
Summary: AUTHORIZATION TO RELEASE  AND FAX INF O TO [facility]   AUTHORIZATION TO RELEASE  AND FAX INF O TO [facility]   Imported By: Arta Bruce 09/12/2009 09:59:50  _____________________________________________________________________  External Attachment:    Type:   Image     Comment:   External Document

## 2010-08-01 NOTE — Assessment & Plan Note (Signed)
Summary: FOLLOW UP WITH DR Richardson Dubree IN 3 MONTHS//GK   Vital Signs:  Patient profile:   77 year old male Weight:      218.1 pounds BMI:     31.18 Temp:     97.6 degrees F oral Pulse rate:   82 / minute Pulse rhythm:   regular Resp:     18 per minute BP sitting:   162 / 75  (left arm) Cuff size:   large  Vitals Entered By: Geanie Cooley  (July 22, 2009 10:30 AM) CC: PT HERE FOR FOLLOWUP, pt state he hasnt been haing any problems or issues Is Patient Diabetic? Yes Did you bring your meter with you today? Yes Pain Assessment Patient in pain? no      CBG Result 184  Does patient need assistance? Functional Status Self care Ambulation Normal   CC:  PT HERE FOR FOLLOWUP and pt state he hasnt been haing any problems or issues.  History of Present Illness: 1.  DM:  did not increase Lantus to 74 units as recommended--only up to 72 units.  Has been to Diabetic educator at our office, but not at [facility].  Still walking twice daily unless really cold, the once daily.  Left sugar documentation at home.  Did not bring in new glucometer--thinks it is not registering sugars properly.  Has had some low blood sugars, but does not feel like his sugar is low.  Low sugars generaly between 9 and 10 a.m.   Cannot recall if he misses his breakfast on days when sugar is low.    2.  Hypertension:  did not take bp meds this morning.  Brings in all meds--all of them with many pills left and should have been out about 1 week ago.    3,  Hyperlipidemia:  AGain, not clear pt. is taking meds appropriately--he seems to be frustrated with questions that he may not be taking regularly.  He has almost a full bottle of [place] [facility] filled 12/13.10.  He denies pouring old pills into new bottles.  Allergies (verified): No Known Drug Allergies                                                                                       v Social History: Retired--[place] Married...wife is also  patient,[place] Tobacco Use - No.  Alcohol Use - no Drug Use - no  Physical Exam  Lungs:  Normal respiratory effort, chest expands symmetrically. Lungs are clear to auscultation, no crackles or wheezes. Heart:  Irregularly irregular, SEM, LSB.  Radial pulses normal and equal Extremities:  No edema.   Impression & Recommendations:  Problem # 1:  DIABETES MELLITUS, TYPE II, ON INSULIN, UNCONTROLLED (ICD-250.02) Increase Lantus to 74 units Encouraged pt. not to skip meals and to bring in sugars next visit. Send to [facility] Diabetic education. His updated medication list for this problem includes:    Aspirin 81 Mg Tabs (Aspirin) .Marland Kitchen... Take 1 tablet by mouth once a day    Metformin Hcl 500 Mg Tabs (Metformin hcl) .Marland Kitchen... 1 tab by mouth two times a day with meals    Lantus Solostar  100 Unit/ml Soln (Insulin glargine) ..... Inject 74  units subcutaneously once daily  and pen needles    Enalapril Maleate 20 Mg Tabs (Enalapril maleate) ..... One tablet by mouth daily  Orders: Nutrition Referral (Nutrition)  Problem # 2:  HYPERTENSION (ICD-401.9) Not controlled, but not clear if taking meds and did not take this morning Need to check with Cardiology if is supposed to be taking 1/2 of Toprol tab now.  Pt. states was decreased secondary to fatigue. His updated medication list for this problem includes:    Furosemide 40 Mg Tabs (Furosemide) .Marland Kitchen... Take 1/2  tablet by mouth once a day    Enalapril Maleate 20 Mg Tabs (Enalapril maleate) ..... One tablet by mouth daily    Toprol Xl 100 Mg Xr24h-tab (Metoprolol succinate) .Marland Kitchen... Take one tablet by mouth every day *crenshaw,b s  Problem # 3:  DYSLIPIDEMIA (ICD-272.4) As above His updated medication list for this problem includes:    Simvastatin 40 Mg Tabs (Simvastatin) .Marland Kitchen... Take 1 tablet by mouth once a day    Niaspan 1000 Mg Tbcr (Niacin (antihyperlipidemic)) .Marland Kitchen... Take 2 tablets at bedtime after a low fat snack.  take baby aspirin 30 minutes  before niaspan  Orders: T-Lipid Profile ([id number]) T-Comprehensive Metabolic Panel ([id number])  Complete Medication List: 1)  Simvastatin 40 Mg Tabs (Simvastatin) .... Take 1 tablet by mouth once a day 2)  Aspirin 81 Mg Tabs (Aspirin) .... Take 1 tablet by mouth once a day 3)  Furosemide 40 Mg Tabs (Furosemide) .... Take 1/2  tablet by mouth once a day 4)  Niaspan 1000 Mg Tbcr (Niacin (antihyperlipidemic)) .... Take 2 tablets at bedtime after a low fat snack.  take baby aspirin 30 minutes before niaspan 5)  Metformin Hcl 500 Mg Tabs (Metformin hcl) .Marland Kitchen.. 1 tab by mouth two times a day with meals 6)  Lantus Solostar 100 Unit/ml Soln (Insulin glargine) .... Inject 74  units subcutaneously once daily  and pen needles 7)  Enalapril Maleate 20 Mg Tabs (Enalapril maleate) .... One tablet by mouth daily 8)  Toprol Xl 100 Mg Xr24h-tab (Metoprolol succinate) .... Take one tablet by mouth every day *crenshaw,b s 9)  Acucheck Aviva Strips  .... To check two times a day iddm 10)  Accu-chek Multiclix Lancets Misc (Lancets) .... Use to check blood sugar twice daily dx 250.00 11)  Coumadin 3 Mg Tabs (Warfarin sodium) .... Use as directed by anticoagulation clinic 12)  Istalol 0.5 % Soln (Timolol maleate) .Marland Kitchen.. 1 drop both eyes every morning dr. Earlene Plater 13)  Xalatan 0.005 % Soln (Latanoprost) .Marland Kitchen.. 1 drop both eyes every evening. dr. Earlene Plater  Other Orders: Flu Vaccine 100yrs + [phone]) Admin 1st Vaccine (13086) Admin 1st Vaccine [facility]) [phone])  Patient Instructions: 1)  Follow up with Dr. Delrae Alfred in 4 months  2)  Call if you do not hear from [facility] in 2 weeks   Vital Signs:  Patient profile:   77 year old male Weight:      218.1 pounds BMI:     31.18 Temp:     97.6 degrees F oral Pulse rate:   82 / minute Pulse rhythm:   regular Resp:     18 per minute BP sitting:   162 / 75  (left arm) Cuff size:   large  Vitals Entered By: Geanie Cooley  (July 22, 2009 10:30 AM)     Laboratory  Results   Blood Tests   Date/Time Received: July 22, 2009 10:48 AM  HGBA1C: 8.3%   (Normal Range: Non-Diabetic - 3-6%   Control Diabetic - 6-8%) CBG Random:: 184mg /dL       Tetanus/Td Immunization History:    Tetanus/Td # 1:  Tdap (11/16/2008)  Influenza Immunization History:    Influenza # 1:  Fluvax 3+ (07/22/2009)  Pneumovax Immunization History:    Pneumovax # 1:  Historical (04/01/2005)  Influenza Vaccine    Vaccine Type: Fluvax 3+    Site: right buttock    Mfr: Sanofi Pasteur    Dose: 0.5 ml    Route: IM    Given by: Geanie Cooley     Exp. Date: 12/29/2009    Lot #: [id number]    VIS given: 01/23/07 version given July 22, 2009.  Flu Vaccine Consent Questions    Do you have a history of severe allergic reactions to this vaccine? no    Any prior history of allergic reactions to egg and/or gelatin? no    Do you have a sensitivity to the preservative Thimersol? no    Do you have a past history of Guillan-Barre Syndrome? no    Do you currently have an acute febrile illness? no    Have you ever had a severe reaction to latex? no    Vaccine information given and explained to patient? yes

## 2010-08-01 NOTE — Letter (Signed)
Summary: *HSN Results Follow up  [facility]  [address] [place], [place] [address]   Phone: [phone]  Fax: [phone]      12/20/2009   Thaddaeus Naff [address]  # [place], [place]  [address]   Dear  Mr. Ashby Raczynski,                            ____S.Drinkard,FNP   ____D. Gore,FNP       ____B. McPherson,MD   ____V. Rankins,MD    _X___E. Finnigan Warriner,MD    ____N. Daphine Deutscher, FNP  ____D. Reche Dixon, MD    ____K. Philipp Deputy, MD    ____Other     This letter is to inform you that your recent test(s):  _______Pap Smear    _______Lab Test     ____X___X-ray    ___X____ is within acceptable limits  _______ requires a medication change  _______ requires a follow-up lab visit  _______ requires a follow-up visit with your provider   Comments:  Left knee Xray was normal.       _________________________________________________________ If you have any questions, please contact our office                     Sincerely,  Julieanne Manson MD [facility]

## 2010-08-01 NOTE — Medication Information (Signed)
Summary: rov/ewj  Anticoagulant Therapy  Managed by: Shelby Dubin, PharmD, BCPS, CPP Referring MD: Olga Millers MD Supervising MD: Tenny Craw MD, Gunnar Fusi Indication 1: atrial fibrillation?? (ICD-427.31) Indication 2: reactivated 07/24/04..returning from [facility] (ICD-1/23/6 Lab Used: [facility] Site: [facility] INR POC 1.9 INR RANGE 2 - 3  Dietary changes: no    Health status changes: no    Bleeding/hemorrhagic complications: no    Recent/future hospitalizations: no    Any changes in medication regimen? no    Recent/future dental: no  Any missed doses?: no       Is patient compliant with meds? yes      Comments: per request, longer period between visits...risks of cva / bleeding d/w interpreter..mp  Allergies (verified): No Known Drug Allergies  Anticoagulation Management History:      The patient is taking warfarin and comes in today for a routine follow up visit.  Positive risk factors for bleeding include an age of 75 years or older, history of CVA/TIA, history of GI bleeding, and presence of serious comorbidities.  The bleeding index is 'high risk'.  Positive CHADS2 values include History of HTN, Age > 74 years old, History of Diabetes, and Prior Stroke/CVA/TIA.  The start date was 09/29/2002.  His last INR was 2.0.  Anticoagulation responsible provider: Tenny Craw MD, Gunnar Fusi.  INR POC: 1.9.  Cuvette Lot#: [id number].  Exp: 10/2010.    Anticoagulation Management Assessment/Plan:      The patient's current anticoagulation dose is Coumadin 3 mg tabs: Use as directed by Anticoagulation Clinic.  The target INR is 2 - 3.  The next INR is due 10/17/2009.  Anticoagulation instructions were given to patient/interpeuter.  Results were reviewed/authorized by Shelby Dubin, PharmD, BCPS, CPP.  He was notified by Shelby Dubin PharmD, BCPS, CPP.         Prior Anticoagulation Instructions: INR 2.5  Continue on same dosage 1.5 tablets daily except 1 tablet on Tuesdays.   Recheck in 3 weeks.    Current  Anticoagulation Instructions: INR 1.9  Take 2 tabs today then 1.5 tabs every day except 1 tab on Tuesdays.  Recheck in 3 - 4 weeks.

## 2010-08-01 NOTE — Letter (Signed)
Summary: NOTE FROM DR.RANKIN  NOTE FROM DR.RANKIN   Imported By: Arta Bruce 09/12/2009 09:21:40  _____________________________________________________________________  External Attachment:    Type:   Image     Comment:   External Document

## 2010-08-01 NOTE — Letter (Signed)
Summary: MEDICAL CERIFICATION FOR DISABILITY EXCEPTIONS//FAMILY TO PICK U  MEDICAL CERIFICATION FOR DISABILITY EXCEPTIONS//FAMILY TO PICK UP   Imported By: Arta Bruce 09/12/2009 10:02:35  _____________________________________________________________________  External Attachment:    Type:   Image     Comment:   External Document

## 2010-08-01 NOTE — Medication Information (Signed)
Summary: rov/ewj   Anticoagulant Therapy  Managed by: Weston Brass, PharmD Referring MD: Olga Millers MD Supervising MD: Myrtis Ser MD, Tinnie Gens Indication 1: atrial fibrillation (ICD-427.31) Indication 2: reactivated 07/24/04..returning from [facility] (ICD-1/23/6 Lab Used: [facility] Site: [facility] INR POC 4.5 INR RANGE 2 - 3  Dietary changes: no    Health status changes: no    Bleeding/hemorrhagic complications: no    Recent/future hospitalizations: no    Any changes in medication regimen? no    Recent/future dental: no  Any missed doses?: no       Is patient compliant with meds? yes      Comments: Recommended to patient to return in 7-10 days, however he is unable to return until 05/08/10.    Allergies: No Known Drug Allergies  Anticoagulation Management History:      The patient is taking warfarin and comes in today for a routine follow up visit.  Positive risk factors for bleeding include an age of 75 years or older, history of CVA/TIA, history of GI bleeding, and presence of serious comorbidities.  The bleeding index is 'high risk'.  Positive CHADS2 values include History of HTN, Age > 75 years old, History of Diabetes, and Prior Stroke/CVA/TIA.  The start date was 09/29/2002.  His last INR was 2.0.  Anticoagulation responsible provider: Myrtis Ser MD, Tinnie Gens.  INR POC: 4.5.  Cuvette Lot#: [id number].  Exp: 05/2011.    Anticoagulation Management Assessment/Plan:      The patient's current anticoagulation dose is Coumadin 3 mg tabs: Use as directed by Anticoagulation Clinic.  The target INR is 2 - 3.  The next INR is due 05/08/2010.  Anticoagulation instructions were given to patient/interpeuter.  Results were reviewed/authorized by Weston Brass, PharmD.  He was notified by Haynes Hoehn, PharmD Candidate.         Prior Anticoagulation Instructions: INR 3.3  Today, take only 1/2 tablet. Then resume regular schedule of 1 1/2 tablets everyday. Re-check INR in 4 weeks.   Current  Anticoagulation Instructions: INR: 4.5  Skip today's dose.  Then take 1 tablet on Wednesday and Saturday, and 1 and 1/2 tablets all other days of the week. Return on 05/08/2010.

## 2010-08-01 NOTE — Assessment & Plan Note (Signed)
Summary: 4 month f/u /tmm   Vital Signs:  Patient profile:   77 year old male Weight:      216 pounds Temp:     97.9 degrees F oral Pulse rate:   83 / minute Pulse rhythm:   regular Resp:     18 per minute BP sitting:   139 / 85  (left arm) Cuff size:   large  Vitals Entered ByBartolo Darter CC: follow-up visit, diabetes,  Is Patient Diabetic? Yes Pain Assessment Patient in pain? no      CBG Result 206  Does patient need assistance? Functional Status Self care Ambulation Normal   CC:  follow-up visit, diabetes, and .  History of Present Illness: 1.  DM:  Is taking Lantus at 74 units daily now.  No low blood sugars.  Tolerating fine.  Had eye check about 2 months ago--no diabetic changes per daughter.  Daughter cannot recall provider's name.  Has not had feet checked since October 2010. Checking sugars --generally in 130s in morning.  2.  Hypertension:  states taking meds regularly and bp has been controlled recently.  3.  Hyperlipidemia:  Last checked in January--just a bit above goal with LDL and HDL.  Has had difficulties eating healthy.  Does walk 2-3 hours daily when not too hot out.  Have had problems getting him to take meds.  4.  Left knee: Discomfort intermittently.  Constant feeling of left knee that is tightly wrapped and does not have feeling in the skin surrounding.  Has had for 4-5 years.  Later, describes with previous work that his knee was up against a metal fence and over time, led to the lack of sensation as above.  Very difficult getting a history regarding this.  Daughter frustrated and unhappy about being here all morning with both parents and help with translation (pt. cannot hear translator over phone well)  is difficult.  Pt. does not answer the questions asked    Current Medications (verified): 1)  Simvastatin 40 Mg  Tabs (Simvastatin) .... Take 1 Tablet By Mouth Once A Day 2)  Aspirin 81 Mg  Tabs (Aspirin) .... Take 1 Tablet By Mouth Once A Day 3)   Furosemide 40 Mg  Tabs (Furosemide) .... Take 1/2  Tablet By Mouth Once A Day 4)  Niaspan 1000 Mg  Tbcr (Niacin (Antihyperlipidemic)) .... Take 2 Tablets At Bedtime After A Low Fat Snack.  Take Baby Aspirin 30 Minutes Before Niaspan 5)  Metformin Hcl 500 Mg Tabs (Metformin Hcl) .Marland Kitchen.. 1 Tab By Mouth Two Times A Day With Meals 6)  Lantus Solostar 100 Unit/ml  Soln (Insulin Glargine) .... Inject 74  Units Subcutaneously Once Daily  and Pen Needles 7)  Enalapril Maleate 20 Mg  Tabs (Enalapril Maleate) .... One Tablet By Mouth Daily 8)  Toprol Xl 100 Mg Xr24h-Tab (Metoprolol Succinate) .... Take One Tablet By Mouth Every Day *crenshaw,b S 9)  Acucheck Aviva Strips .... To Check Two Times A Day Iddm 10)  Accu-Chek Multiclix Lancets  Misc (Lancets) .... Use To Check Blood Sugar Twice Daily Dx 250.00 11)  Coumadin 3 Mg Tabs (Warfarin Sodium) .... Use As Directed By Anticoagulation Clinic 12)  Istalol 0.5 % Soln (Timolol Maleate) .Marland Kitchen.. 1 Drop Both Eyes Every Morning Dr. Earlene Plater 13)  Xalatan 0.005 % Soln (Latanoprost) .Marland Kitchen.. 1 Drop Both Eyes Every Evening. Dr. Earlene Plater  Allergies (verified): No Known Drug Allergies  Physical Exam  Lungs:  Normal respiratory effort, chest expands symmetrically. Lungs are  clear to auscultation, no crackles or wheezes. Heart:  Irregularly irregular,  radial pulses normal and equal. Extremities:  Mild hypertrophic changes of knee.  NT over joint margin, patella.  No tenderness  or laxity with ligaments stress--cruciates or collaterals.  No erythema or obvious effusion.   Band around knee with decreased light touch sensation  Diabetes Management Exam:    Foot Exam (with socks and/or shoes not present):       Sensory-Monofilament:          Left foot: normal          Right foot: normal   Impression & Recommendations:  Problem # 1:  KNEE PAIN, LEFT (ICD-719.46) As no real pain, more just a dysesthesia, no pain meds Discussed would likely not change this--suspect an peripheral  nerve injury from compression with a previous job His updated medication list for this problem includes:    Aspirin 81 Mg Tabs (Aspirin) .Marland Kitchen... Take 1 tablet by mouth once a day  Orders: Diagnostic X-Ray/Fluoroscopy (Diagnostic X-Ray/Flu)  Problem # 2:  HYPERTENSION (ICD-401.9) Controlled at this time His updated medication list for this problem includes:    Furosemide 40 Mg Tabs (Furosemide) .Marland Kitchen... Take 1/2  tablet by mouth once a day    Enalapril Maleate 20 Mg Tabs (Enalapril maleate) ..... One tablet by mouth daily    Toprol Xl 100 Mg Xr24h-tab (Metoprolol succinate) .Marland Kitchen... Take one tablet by mouth every day *crenshaw,b s  Problem # 3:  DIABETES MELLITUS, TYPE II, ON INSULIN, UNCONTROLLED (ICD-250.02) Improving control Foot exam today. Check on eye exam. His updated medication list for this problem includes:    Aspirin 81 Mg Tabs (Aspirin) .Marland Kitchen... Take 1 tablet by mouth once a day    Metformin Hcl 500 Mg Tabs (Metformin hcl) .Marland Kitchen... 1 tab by mouth two times a day with meals    Lantus Solostar 100 Unit/ml Soln (Insulin glargine) ..... Inject 74  units subcutaneously once daily  and pen needles    Enalapril Maleate 20 Mg Tabs (Enalapril maleate) ..... One tablet by mouth daily  Orders: T-Urine Microalbumin w/creat. ratio [phone])  Complete Medication List: 1)  Simvastatin 40 Mg Tabs (Simvastatin) .... Take 1 tablet by mouth once a day 2)  Aspirin 81 Mg Tabs (Aspirin) .... Take 1 tablet by mouth once a day 3)  Furosemide 40 Mg Tabs (Furosemide) .... Take 1/2  tablet by mouth once a day 4)  Niaspan 1000 Mg Tbcr (Niacin (antihyperlipidemic)) .... Take 2 tablets at bedtime after a low fat snack.  take baby aspirin 30 minutes before niaspan 5)  Metformin Hcl 500 Mg Tabs (Metformin hcl) .Marland Kitchen.. 1 tab by mouth two times a day with meals 6)  Lantus Solostar 100 Unit/ml Soln (Insulin glargine) .... Inject 74  units subcutaneously once daily  and pen needles 7)  Enalapril Maleate 20 Mg Tabs  (Enalapril maleate) .... One tablet by mouth daily 8)  Toprol Xl 100 Mg Xr24h-tab (Metoprolol succinate) .... Take one tablet by mouth every day *crenshaw,b s 9)  Acucheck Aviva Strips  .... To check two times a day iddm 10)  Accu-chek Multiclix Lancets Misc (Lancets) .... Use to check blood sugar twice daily dx 250.00 11)  Coumadin 3 Mg Tabs (Warfarin sodium) .... Use as directed by anticoagulation clinic 12)  Istalol 0.5 % Soln (Timolol maleate) .Marland Kitchen.. 1 drop both eyes every morning dr. Earlene Plater 13)  Xalatan 0.005 % Soln (Latanoprost) .Marland Kitchen.. 1 drop both eyes every evening. dr. Earlene Plater  Patient Instructions:  1)  Follow up with Dr. Delrae Alfred in 4 months --DM,htn, attempt at some health maintenance issues  Laboratory Results   Blood Tests     HGBA1C: 7.9%   (Normal Range: Non-Diabetic - 3-6%   Control Diabetic - 6-8%) CBG Random:: 206mg /dL       Last LDL:                                                 74 (07/22/2009 9:30:00 PM)        Diabetic Foot Exam Last Podiatry Exam Date: 01/18/2009    10-g (5.07) Semmes-Weinstein Monofilament Test Performed by: Vesta Mixer CMA          Right Foot          Left Foot Visual Inspection               Test Control      normal         normal Site 1         normal         normal Site 2         normal         normal Site 3         normal         normal Site 4         normal         normal Site 5         normal         normal Site 6         normal         normal Site 7         normal         normal Site 8         normal         normal Site 9         normal         normal Site 10         normal         normal  Impression      normal         normal

## 2010-08-01 NOTE — Medication Information (Signed)
Summary: rovmp  Anticoagulant Therapy  Managed by: Weston Brass, PharmD Referring MD: Olga Millers MD Supervising MD: Jens Som MD, Arlys John Indication 1: atrial fibrillation?? (ICD-427.31) Indication 2: reactivated 07/24/04..returning from [facility] (ICD-1/23/6 Lab Used: [facility] Site: [facility] INR POC 1.9 INR RANGE 2 - 3  Dietary changes: no    Health status changes: no    Bleeding/hemorrhagic complications: no    Recent/future hospitalizations: no    Any changes in medication regimen? no    Recent/future dental: no  Any missed doses?: no       Is patient compliant with meds? yes      Comments: Recommended pt return in 3 week.  Pt's wife requests 4 weeks.  Explained risks/benefits and she is aware to call with any problems.   Allergies: No Known Drug Allergies  Anticoagulation Management History:      The patient is taking warfarin and comes in today for a routine follow up visit.  Positive risk factors for bleeding include an age of 75 years or older, history of CVA/TIA, history of GI bleeding, and presence of serious comorbidities.  The bleeding index is 'high risk'.  Positive CHADS2 values include History of HTN, Age > 75 years old, History of Diabetes, and Prior Stroke/CVA/TIA.  The start date was 09/29/2002.  His last INR was 2.0.  Anticoagulation responsible provider: Jens Som MD, Arlys John.  INR POC: 1.9.  Cuvette Lot#: [id number].  Exp: 10/2010.    Anticoagulation Management Assessment/Plan:      The patient's current anticoagulation dose is Coumadin 3 mg tabs: Use as directed by Anticoagulation Clinic.  The target INR is 2 - 3.  The next INR is due 11/21/2009.  Anticoagulation instructions were given to patient/interpeuter.  Results were reviewed/authorized by Weston Brass, PharmD.  He was notified by Weston Brass PharmD.         Prior Anticoagulation Instructions: INR 1.9  Take 2 tabs today then 1.5 tabs every day except 1 tab on Tuesdays.  Recheck in 3 - 4 weeks.  Current  Anticoagulation Instructions: INR 1.9  Increase dose to 1 1/2 tablets every day The patient is to hold four doses of coumadin.  The dosage to be resumed includes:

## 2010-08-01 NOTE — Letter (Signed)
Summary: NUTRITION SUMMARY//Harold Jones  NUTRITION SUMMARY//Harold Jones   Imported By: Arta Bruce 11/22/2009 15:05:28  _____________________________________________________________________  External Attachment:    Type:   Image     Comment:   External Document

## 2010-08-01 NOTE — Medication Information (Signed)
Summary: rov/tm/will need to call the interperter line/per rashanda/saf  Anticoagulant Therapy  Managed by: Cloyde Reams, RN, BSN Referring MD: Olga Millers MD Supervising MD: Antoine Poche MD, Fayrene Fearing Indication 1: atrial fibrillation (ICD-427.31) Indication 2: reactivated 07/24/04..returning from [facility] (ICD-1/23/6 Lab Used: [facility] Site: [facility] INR POC 2.7 INR RANGE 2 - 3  Dietary changes: no    Health status changes: no    Bleeding/hemorrhagic complications: no    Recent/future hospitalizations: no    Any changes in medication regimen? no    Recent/future dental: no  Any missed doses?: no       Is patient compliant with meds? yes       Allergies: No Known Drug Allergies  Anticoagulation Management History:      The patient is taking warfarin and comes in today for a routine follow up visit.  Positive risk factors for bleeding include an age of 109 years or older, history of CVA/TIA, history of GI bleeding, and presence of serious comorbidities.  The bleeding index is 'high risk'.  Positive CHADS2 values include History of HTN, Age > 91 years old, History of Diabetes, and Prior Stroke/CVA/TIA.  The start date was 09/29/2002.  His last INR was 2.0.  Anticoagulation responsible provider: Antoine Poche MD, Fayrene Fearing.  INR POC: 2.7.  Cuvette Lot#: [id number].  Exp: 05/2011.    Anticoagulation Management Assessment/Plan:      The patient's current anticoagulation dose is Coumadin 3 mg tabs: Use as directed by Anticoagulation Clinic.  The target INR is 2 - 3.  The next INR is due 03/20/2010.  Anticoagulation instructions were given to patient/interpeuter.  Results were reviewed/authorized by Cloyde Reams, RN, BSN.  He was notified by Cloyde Reams RN.         Prior Anticoagulation Instructions: INR 2.3 Continue 1.5 tablets everyday. Recheck in 4 weeks.   Current Anticoagulation Instructions: INR 2.7  Continue on same dosage 1.5 tablets daily.  Recheck in 4 weeks.

## 2010-08-03 NOTE — Letter (Signed)
Summary: Lipid Letter  [facility]  [address] [place], [place] [address]   Phone: [phone]  Fax: [phone]    06/21/2010  Harold Jones [address] [place], [place]  [address]  Dear Harold Jones:  We have carefully reviewed your last lipid profile from 05/30/2010 and the results are noted below with a summary of recommendations for lipid management.    Cholesterol:       124     Goal: <200   HDL "good" Cholesterol:   23     Goal: >45   LDL "bad" Cholesterol:   70     Goal: <70   Triglycerides:       157     Goal: <150    Cholesterol is okay.  Would like to see the "good portion"  greater than 45--exercise will help this.  Please make sure you are taking both the Pravastatin and Niaspan.    TLC Diet (Therapeutic Lifestyle Change): Saturated Fats & Transfatty acids should be kept < 7% of total calories ***Reduce Saturated Fats Polyunstaurated Fat can be up to 10% of total calories Monounsaturated Fat Fat can be up to 20% of total calories Total Fat should be no greater than 25-35% of total calories Carbohydrates should be 50-60% of total calories Protein should be approximately 15% of total calories Fiber should be at least 20-30 grams a day ***Increased fiber may help lower LDL Total Cholesterol should be < 200mg /day Consider adding plant stanol/sterols to diet (example: Benacol spread) ***A higher intake of unsaturated fat may reduce Triglycerides and Increase HDL    Adjunctive Measures (may lower LIPIDS and reduce risk of Heart Attack) include: Aerobic Exercise (20-30 minutes 3-4 times a week) Limit Alcohol Consumption Weight Reduction Aspirin 75-81 mg a day by mouth (if not allergic or contraindicated) Dietary Fiber 20-30 grams a day by mouth     Current Medications: 1)    Pravastatin Sodium 40 Mg Tabs (Pravastatin sodium) .Marland Kitchen.. 1 tab by mouth daily with meal 2)    Aspirin 81 Mg  Tabs (Aspirin) .... Take 1 tablet by mouth  once a day 3)    Furosemide 40 Mg  Tabs (Furosemide) .... Take 1/2  tablet by mouth once a day 4)    Niaspan 1000 Mg  Tbcr (Niacin (antihyperlipidemic)) .... Take 2 tablets at bedtime after a low fat snack.  take baby aspirin 30 minutes before niaspan 5)    Metformin Hcl 500 Mg Tabs (Metformin hcl) .Marland Kitchen.. 1 tab by mouth two times a day with meals 6)    Lantus Solostar 100 Unit/ml  Soln (Insulin glargine) .... Inject 74  units subcutaneously once daily  and pen needles 7)    Enalapril Maleate 20 Mg  Tabs (Enalapril maleate) .... One tablet by mouth daily 8)    Toprol Xl 100 Mg Xr24h-tab (Metoprolol succinate) .... Take one tablet by mouth every day 9)    Acucheck Aviva Strips  .... To check two times a day iddm 10)    Accu-chek Multiclix Lancets  Misc (Lancets) .... Use to check blood sugar twice daily dx 250.00 11)    Coumadin 3 Mg Tabs (Warfarin sodium) .... Use as directed by anticoagulation clinic 12)    Istalol 0.5 % Soln (Timolol maleate) .Marland Kitchen.. 1 drop both eyes every morning dr. Earlene Plater 13)    Xalatan 0.005 % Soln (Latanoprost) .Marland Kitchen.. 1 drop both eyes every evening. dr. Earlene Plater  If you have any questions, please call.  We appreciate being able to work with you.   Sincerely,    [facility] Julieanne Manson MD

## 2010-08-03 NOTE — Medication Information (Signed)
Summary: rov/tm  Anticoagulant Therapy  Managed by: Bethena Midget, RN, BSN Referring MD: Olga Millers MD Supervising MD: Eden Emms MD, Theron Arista Indication 1: atrial fibrillation (ICD-427.31) Indication 2: reactivated 07/24/04..returning from [facility] (ICD-1/23/6 Lab Used: [facility] Site: [facility] INR POC 2.3 INR RANGE 2 - 3  Dietary changes: no    Health status changes: no    Bleeding/hemorrhagic complications: no    Recent/future hospitalizations: no    Any changes in medication regimen? no    Recent/future dental: no  Any missed doses?: no       Is patient compliant with meds? yes       Allergies: No Known Drug Allergies  Anticoagulation Management History:      The patient is taking warfarin and comes in today for a routine follow up visit.  Positive risk factors for bleeding include an age of 75 years or older, history of CVA/TIA, history of GI bleeding, and presence of serious comorbidities.  The bleeding index is 'high risk'.  Positive CHADS2 values include History of HTN, Age > 72 years old, History of Diabetes, and Prior Stroke/CVA/TIA.  The start date was 09/29/2002.  His last INR was 2.0.  Anticoagulation responsible provider: Eden Emms MD, Theron Arista.  INR POC: 2.3.  Cuvette Lot#: [id number].  Exp: 08/2011.    Anticoagulation Management Assessment/Plan:      The patient's current anticoagulation dose is Coumadin 3 mg tabs: Use as directed by Anticoagulation Clinic.  The target INR is 2 - 3.  The next INR is due 08/04/2010.  Anticoagulation instructions were given to patient/interpeuter.  Results were reviewed/authorized by Bethena Midget, RN, BSN.  He was notified by Bethena Midget, RN, BSN.         Prior Anticoagulation Instructions: INR 2.9 Continue 1.5 pills everyday except 1 pill on Wednesdays and Saturdays. REcheck in 4 weeks.   Current Anticoagulation Instructions: INR 2.3 Continue 4.5mg s everyday except 3mg s on Wednesdays and Saturdays. Recheck in 4 weeks.

## 2010-08-04 ENCOUNTER — Encounter: Payer: Self-pay | Admitting: Cardiology

## 2010-08-04 ENCOUNTER — Encounter (INDEPENDENT_AMBULATORY_CARE_PROVIDER_SITE_OTHER): Payer: Medicare Other

## 2010-08-04 ENCOUNTER — Ambulatory Visit: Admit: 2010-08-04 | Payer: Self-pay

## 2010-08-04 DIAGNOSIS — Z7901 Long term (current) use of anticoagulants: Secondary | ICD-10-CM

## 2010-08-04 DIAGNOSIS — I4891 Unspecified atrial fibrillation: Secondary | ICD-10-CM

## 2010-08-04 LAB — CONVERTED CEMR LAB: POC INR: 1.9

## 2010-08-09 NOTE — Medication Information (Signed)
Summary: rov/kh   Anticoagulant Therapy  Managed by: Tammy Sours, PharmD Referring MD: Olga Millers MD Supervising MD: Jens Som MD, Arlys John Indication 1: atrial fibrillation (ICD-427.31) Indication 2: reactivated 07/24/04..returning from [facility] (ICD-1/23/6 Lab Used: [facility] Site: [facility] INR POC 1.9 INR RANGE 2 - 3  Dietary changes: no    Health status changes: no    Bleeding/hemorrhagic complications: no    Recent/future hospitalizations: no    Any changes in medication regimen? no    Recent/future dental: no  Any missed doses?: no       Is patient compliant with meds? yes       Allergies: No Known Drug Allergies  Anticoagulation Management History:      The patient is taking warfarin and comes in today for a routine follow up visit.  Positive risk factors for bleeding include an age of 75 years or older, history of CVA/TIA, history of GI bleeding, and presence of serious comorbidities.  The bleeding index is 'high risk'.  Positive CHADS2 values include History of HTN, Age > 5 years old, History of Diabetes, and Prior Stroke/CVA/TIA.  The start date was 09/29/2002.  His last INR was 2.0.  Anticoagulation responsible provider: Jens Som MD, Arlys John.  INR POC: 1.9.  Cuvette Lot#: [id number].  Exp: 08/2011.    Anticoagulation Management Assessment/Plan:      The patient's current anticoagulation dose is Coumadin 3 mg tabs: Use as directed by Anticoagulation Clinic.  The target INR is 2 - 3.  The next INR is due 09/01/2010.  Anticoagulation instructions were given to patient/interpeuter.  Results were reviewed/authorized by Tammy Sours, PharmD.         Prior Anticoagulation Instructions: INR 2.3 Continue 4.5mg s everyday except 3mg s on Wednesdays and Saturdays. Recheck in 4 weeks.   Current Anticoagulation Instructions: INR 1.9   Take 2 tablets today. Then resume taking 1 and 1/2 tablets (4.5mg ) everday except 1 tablet on Wednesdays and Saturdays (3mg ). Recheck INR  in 3 weeks.

## 2010-08-21 DIAGNOSIS — Z954 Presence of other heart-valve replacement: Secondary | ICD-10-CM

## 2010-08-21 DIAGNOSIS — I4891 Unspecified atrial fibrillation: Secondary | ICD-10-CM

## 2010-08-21 DIAGNOSIS — I6350 Cerebral infarction due to unspecified occlusion or stenosis of unspecified cerebral artery: Secondary | ICD-10-CM

## 2010-08-21 DIAGNOSIS — I359 Nonrheumatic aortic valve disorder, unspecified: Secondary | ICD-10-CM | POA: Insufficient documentation

## 2010-08-24 ENCOUNTER — Encounter (INDEPENDENT_AMBULATORY_CARE_PROVIDER_SITE_OTHER): Payer: Self-pay | Admitting: Internal Medicine

## 2010-08-29 NOTE — Letter (Signed)
Summary: DIABETIC SUPPLIES//MAILED  DIABETIC SUPPLIES//MAILED   Imported By: Arta Bruce 08/24/2010 11:04:22  _____________________________________________________________________  External Attachment:    Type:   Image     Comment:   External Document

## 2010-09-01 ENCOUNTER — Encounter (INDEPENDENT_AMBULATORY_CARE_PROVIDER_SITE_OTHER): Payer: Medicare Other

## 2010-09-01 ENCOUNTER — Encounter: Payer: Self-pay | Admitting: Internal Medicine

## 2010-09-01 DIAGNOSIS — I4891 Unspecified atrial fibrillation: Secondary | ICD-10-CM

## 2010-09-01 DIAGNOSIS — Z7901 Long term (current) use of anticoagulants: Secondary | ICD-10-CM

## 2010-09-07 NOTE — Medication Information (Signed)
Summary: rov/sp  Anticoagulant Therapy  Managed by: Windell Hummingbird, RN Referring MD: Olga Millers MD Supervising MD: Gala Romney MD, Reuel Boom Indication 1: atrial fibrillation (ICD-427.31) Indication 2: reactivated 07/24/04..returning from [facility] (ICD-1/23/6 Lab Used: [facility] Site: [facility] INR POC 2.1 INR RANGE 2 - 3  Dietary changes: no    Health status changes: no    Bleeding/hemorrhagic complications: no    Recent/future hospitalizations: no    Any changes in medication regimen? no    Recent/future dental: no  Any missed doses?: no       Is patient compliant with meds? yes       Allergies: No Known Drug Allergies  Anticoagulation Management History:      The patient is taking warfarin and comes in today for a routine follow up visit.  Positive risk factors for bleeding include an age of 3 years or older, history of CVA/TIA, history of GI bleeding, and presence of serious comorbidities.  The bleeding index is 'high risk'.  Positive CHADS2 values include History of HTN, Age > 67 years old, History of Diabetes, and Prior Stroke/CVA/TIA.  The start date was 09/29/2002.  His last INR was 2.0.  Anticoagulation responsible provider: Lorance Pickeral MD, Reuel Boom.  INR POC: 2.1.  Cuvette Lot#: [id number].  Exp: 07/2011.    Anticoagulation Management Assessment/Plan:      The patient's current anticoagulation dose is Coumadin 3 mg tabs: Use as directed by Anticoagulation Clinic.  The target INR is 2 - 3.  The next INR is due 09/29/2010.  Anticoagulation instructions were given to patient/interpeuter.  Results were reviewed/authorized by Windell Hummingbird, RN.  He was notified by Windell Hummingbird, RN.         Prior Anticoagulation Instructions: INR 1.9   Take 2 tablets today. Then resume taking 1 and 1/2 tablets (4.5mg ) everday except 1 tablet on Wednesdays and Saturdays (3mg ). Recheck INR in 3 weeks.   Current Anticoagulation Instructions: INR 2.1 Continue taking 1 1/2 tablets every day, except  take 1 tablet on Wednesdays and Saturdays. Recheck in 4 weeks.

## 2010-09-08 ENCOUNTER — Encounter (INDEPENDENT_AMBULATORY_CARE_PROVIDER_SITE_OTHER): Payer: Self-pay | Admitting: Internal Medicine

## 2010-09-08 ENCOUNTER — Encounter: Payer: Self-pay | Admitting: Internal Medicine

## 2010-09-08 DIAGNOSIS — H409 Unspecified glaucoma: Secondary | ICD-10-CM | POA: Insufficient documentation

## 2010-09-08 LAB — CONVERTED CEMR LAB
Alkaline Phosphatase: 39 units/L (ref 39–117)
Basophils Relative: 0 % (ref 0–1)
Blood Glucose, Fingerstick: 106
Eosinophils Absolute: 0.2 10*3/uL (ref 0.0–0.7)
Eosinophils Relative: 4 % (ref 0–5)
Glucose, Bld: 102 mg/dL — ABNORMAL HIGH (ref 70–99)
MCHC: 33.6 g/dL (ref 30.0–36.0)
MCV: 92.4 fL (ref 78.0–100.0)
Monocytes Absolute: 0.5 10*3/uL (ref 0.1–1.0)
Monocytes Relative: 9 % (ref 3–12)
Neutrophils Relative %: 53 % (ref 43–77)
RBC: 4.73 M/uL (ref 4.22–5.81)
Sodium: 141 meq/L (ref 135–145)
Total Bilirubin: 1 mg/dL (ref 0.3–1.2)
Total Protein: 7.3 g/dL (ref 6.0–8.3)

## 2010-09-12 NOTE — Assessment & Plan Note (Signed)
Summary: 4 month f/u for DM and HTN   Vital Signs:  Patient profile:   77 year old male Weight:      220.25 pounds Temp:     98.3 degrees F oral Pulse rate:   53 / minute Pulse rhythm:   regular Resp:     20 per minute BP sitting:   162 / 117  (left arm) Cuff size:   regular  Vitals Entered By: Hale Drone CMA (September 08, 2010 9:03 AM) CC: 4 month f/u on DM and HTN. Is Patient Diabetic? Yes CBG Result 106 CBG Device ID fasting  Does patient need assistance? Functional Status Self care Ambulation Normal   CC:  4 month f/u on DM and HTN.Marland Kitchen  History of Present Illness: 1.  Hypertension:  Appears he is not taking his Metoprolol again-possibly.  Does not sound like he has a pill box.    2.  Hyperlipidemia:  May be missing his Niaspan.  Discussed cholesterol was okay except for HDL.  His bottle today is dated 11/2009, but pharmacy states he has picked up regularly  3.  DM:  Not clear he is checking blood sugars.  Daughter states sugars have been okay.  She states they have had to pay for Lantus 3 times monthly.  No lows with sugars.  Not very active--states his hip bothers him.  Refuses to use any exercise equipment.  Current Medications (verified): 1)  Pravastatin Sodium 40 Mg Tabs (Pravastatin Sodium) .Marland Kitchen.. 1 Tab By Mouth Daily With Meal 2)  Aspirin 81 Mg  Tabs (Aspirin) .... Take 1 Tablet By Mouth Once A Day 3)  Furosemide 40 Mg  Tabs (Furosemide) .... Take 1/2  Tablet By Mouth Once A Day 4)  Niaspan 1000 Mg  Tbcr (Niacin (Antihyperlipidemic)) .... Take 2 Tablets At Bedtime After A Low Fat Snack.  Take Baby Aspirin 30 Minutes Before Niaspan 5)  Metformin Hcl 500 Mg Tabs (Metformin Hcl) .Marland Kitchen.. 1 Tab By Mouth Two Times A Day With Meals 6)  Lantus Solostar 100 Unit/ml  Soln (Insulin Glargine) .... Inject 74  Units Subcutaneously Once Daily  and Pen Needles 7)  Enalapril Maleate 20 Mg  Tabs (Enalapril Maleate) .... One Tablet By Mouth Daily 8)  Toprol Xl 100 Mg Xr24h-Tab (Metoprolol  Succinate) .... Take One Tablet By Mouth Every Day 9)  Acucheck Aviva Strips .... To Check Two Times A Day Iddm 10)  Accu-Chek Multiclix Lancets  Misc (Lancets) .... Use To Check Blood Sugar Twice Daily Dx 250.00 11)  Coumadin 3 Mg Tabs (Warfarin Sodium) .... Use As Directed By Anticoagulation Clinic 12)  Istalol 0.5 % Soln (Timolol Maleate) .Marland Kitchen.. 1 Drop Both Eyes Every Morning Dr. Earlene Plater 13)  Xalatan 0.005 % Soln (Latanoprost) .Marland Kitchen.. 1 Drop Both Eyes Every Evening. Dr. Earlene Plater  Allergies (verified): No Known Drug Allergies  Physical Exam  General:  NAD Lungs:  Normal respiratory effort, chest expands symmetrically. Lungs are clear to auscultation, no crackles or wheezes. Heart:  Irregularly irregular. Radial pulses normodynamic and equal  Diabetes Management Exam:    Foot Exam (with socks and/or shoes not present):       Sensory-Monofilament:          Left foot: normal          Right foot: normal   Impression & Recommendations:  Problem # 1:  DYSLIPIDEMIA (ICD-272.4) Not clear taking Niaspan--encouraged daughter to set up with pill box--does not seem motivated, however to do so. His updated medication list  for this problem includes:    Pravastatin Sodium 40 Mg Tabs (Pravastatin sodium) .Marland Kitchen... 1 tab by mouth daily with meal    Niaspan 1000 Mg Tbcr (Niacin (antihyperlipidemic)) .Marland Kitchen... Take 2 tablets at bedtime after a low fat snack.  take baby aspirin 30 minutes before niaspan  Problem # 2:  HYPERTENSION (ICD-401.9) Not controlled--as was controlled at last visit, suspect he is missing meds again--see above. Daughter did not want to bring in in 2 weeks--she can bring him back for bp check in 4 weeks. His updated medication list for this problem includes:    Furosemide 40 Mg Tabs (Furosemide) .Marland Kitchen... Take 1/2  tablet by mouth once a day    Enalapril Maleate 20 Mg Tabs (Enalapril maleate) ..... One tablet by mouth daily    Toprol Xl 100 Mg Xr24h-tab (Metoprolol succinate) .Marland Kitchen... Take one  tablet by mouth every day  Problem # 3:  DIABETES MELLITUS, TYPE II, ON INSULIN, UNCONTROLLED (ICD-250.02) Was controlled at last visit--check A1C as did not bring in sugars. Suspect they are not checking sugars at home very often His updated medication list for this problem includes:    Aspirin 81 Mg Tabs (Aspirin) .Marland Kitchen... Take 1 tablet by mouth once a day    Metformin Hcl 500 Mg Tabs (Metformin hcl) .Marland Kitchen... 1 tab by mouth two times a day with meals    Lantus Solostar 100 Unit/ml Soln (Insulin glargine) ..... Inject 74  units subcutaneously once daily  and pen needles    Enalapril Maleate 20 Mg Tabs (Enalapril maleate) ..... One tablet by mouth daily  Orders: T- Hemoglobin A1C ([id number])  Problem # 4:  Preventive Health Care (ICD-V70.0) Daughter states her father is not interested in preventive treatment--colonoscopy, etc.  Complete Medication List: 1)  Pravastatin Sodium 40 Mg Tabs (Pravastatin sodium) .Marland Kitchen.. 1 tab by mouth daily with meal 2)  Aspirin 81 Mg Tabs (Aspirin) .... Take 1 tablet by mouth once a day 3)  Furosemide 40 Mg Tabs (Furosemide) .... Take 1/2  tablet by mouth once a day 4)  Niaspan 1000 Mg Tbcr (Niacin (antihyperlipidemic)) .... Take 2 tablets at bedtime after a low fat snack.  take baby aspirin 30 minutes before niaspan 5)  Metformin Hcl 500 Mg Tabs (Metformin hcl) .Marland Kitchen.. 1 tab by mouth two times a day with meals 6)  Lantus Solostar 100 Unit/ml Soln (Insulin glargine) .... Inject 74  units subcutaneously once daily  and pen needles 7)  Enalapril Maleate 20 Mg Tabs (Enalapril maleate) .... One tablet by mouth daily 8)  Toprol Xl 100 Mg Xr24h-tab (Metoprolol succinate) .... Take one tablet by mouth every day 9)  Acucheck Aviva Strips  .... To check two times a day iddm 10)  Accu-chek Multiclix Lancets Misc (Lancets) .... Use to check blood sugar twice daily dx 250.00 11)  Coumadin 3 Mg Tabs (Warfarin sodium) .... Use as directed by anticoagulation clinic 12)  Istalol 0.5  % Soln (Timolol maleate) .Marland Kitchen.. 1 drop both eyes every morning dr. Earlene Plater 13)  Xalatan 0.005 % Soln (Latanoprost) .Marland Kitchen.. 1 drop both eyes every evening. dr. Earlene Plater  Other Orders: Capillary Blood Glucose/CBG [phone]) T-Comprehensive Metabolic Panel [phone]) T-CBC w/Diff [phone])  Patient Instructions: 1)  Nurse visit for bp check in 2 weeks--no change to meds--may not be taking Metoprolol as bp high today   Orders Added: 1)  Capillary Blood Glucose/CBG [82948] 2)  T-Comprehensive Metabolic Panel [[id number]] 3)  T-CBC w/Diff [[id number]] 4)  T- Hemoglobin A1C [[id number]] 5)  Est. Patient  Level III [[address]]     Diabetic Foot Exam Last Podiatry Exam Date: 01/18/2009    10-g (5.07) Semmes-Weinstein Monofilament Test Performed by: Hale Drone CMA          Right Foot          Left Foot Visual Inspection     normal         normal Test Control      normal         normal Site 1         normal         normal Site 2         normal         normal Site 3         normal         normal Site 4         normal         normal Site 5         normal         normal Site 6         normal         normal Site 7         normal         normal Site 8         normal         normal Site 9         normal         normal Site 10         normal         normal  Impression      normal         normal

## 2010-09-14 ENCOUNTER — Encounter (INDEPENDENT_AMBULATORY_CARE_PROVIDER_SITE_OTHER): Payer: Self-pay | Admitting: Internal Medicine

## 2010-09-19 NOTE — Letter (Signed)
Summary: *HSN Results Follow up  [facility]  [address] [place], [place] [address]   Phone: [phone]  Fax: [phone]      09/14/2010   Crispin Noffke [address] [place], [place]  [address]  [place]   Dear  Mr. Janes Beckley,                            ____S.Drinkard,FNP   ____D. Gore,FNP       ____B. McPherson,MD   ____V. Rankins,MD    __X__E. Emelee Rodocker,MD    ____N. Daphine Deutscher, FNP  ____D. Reche Dixon, MD    ____K. Philipp Deputy, MD    ____Other     This letter is to inform you that your recent test(s):  _______Pap Smear    ___X____Lab Test     _______X-ray    __X_____ is within acceptable limits  _______ requires a medication change  _______ requires a follow-up lab visit  _______ requires a follow-up visit with your provider   Comments:  Kidney, liver function is fine.  No anemia.  Your sugar control is also good.       _________________________________________________________ If you have any questions, please contact our office                     Sincerely,  Julieanne Manson MD [facility]

## 2010-09-29 ENCOUNTER — Ambulatory Visit (INDEPENDENT_AMBULATORY_CARE_PROVIDER_SITE_OTHER): Payer: Medicare Other | Admitting: *Deleted

## 2010-09-29 DIAGNOSIS — I6350 Cerebral infarction due to unspecified occlusion or stenosis of unspecified cerebral artery: Secondary | ICD-10-CM

## 2010-09-29 DIAGNOSIS — Z954 Presence of other heart-valve replacement: Secondary | ICD-10-CM

## 2010-09-29 DIAGNOSIS — I4891 Unspecified atrial fibrillation: Secondary | ICD-10-CM

## 2010-09-29 DIAGNOSIS — I359 Nonrheumatic aortic valve disorder, unspecified: Secondary | ICD-10-CM

## 2010-09-29 LAB — POCT INR: INR: 2.1

## 2010-09-29 NOTE — Patient Instructions (Signed)
INR 2.1 Continue taking 1/12 tablets daily except take 1 tablet on Wednesdays and Saturdays. Recheck in 4 weeks.

## 2010-10-05 ENCOUNTER — Other Ambulatory Visit: Payer: Self-pay | Admitting: Cardiology

## 2010-10-25 ENCOUNTER — Ambulatory Visit (INDEPENDENT_AMBULATORY_CARE_PROVIDER_SITE_OTHER): Payer: Medicare Other | Admitting: *Deleted

## 2010-10-25 DIAGNOSIS — Z954 Presence of other heart-valve replacement: Secondary | ICD-10-CM

## 2010-10-25 DIAGNOSIS — I4891 Unspecified atrial fibrillation: Secondary | ICD-10-CM

## 2010-10-25 DIAGNOSIS — I6350 Cerebral infarction due to unspecified occlusion or stenosis of unspecified cerebral artery: Secondary | ICD-10-CM

## 2010-10-25 DIAGNOSIS — I359 Nonrheumatic aortic valve disorder, unspecified: Secondary | ICD-10-CM

## 2010-11-01 ENCOUNTER — Other Ambulatory Visit: Payer: Self-pay | Admitting: Cardiology

## 2010-11-14 NOTE — Assessment & Plan Note (Signed)
[facility]                            CARDIOLOGY OFFICE NOTE   NAME:Harold Jones, Harold Jones                    MRN:          [id number]  DATE:05/28/2007                            DOB:          1928/06/19    Mr. Harner returns for followup today. He has a history of aortic  valve replacement with a bio-prosthetic valve as well as replacement of  his aortic root. He also has a history of paroxysmal atrial  fibrillation. Since I last saw him, he denies any increased dyspnea on  exertion, orthopnea, PND, pedal edema, palpitations, pre-syncope or  syncope. He does have a pain in his left chest area occasionally when he  exerts himself that improves with nitroglycerin. He points to his chest  with one finger when asked about the location. Note, his history is via  Nurse, learning disability.   MEDICATIONS:  1. Aspirin 325 mg p.o. daily.  2. Fortamet ER 500 mg p.o. daily.  3. Niaspan 1 gram p.o. daily.  4. Enalapril is not being taken.  5. Zocor 40 mg p.o. daily.  6. Toprol 50 mg p.o. daily.  7. Lasix 20 mg p.o. daily.  8. Insulin.  9. Coumadin.   PHYSICAL EXAMINATION:  Shows a blood pressure of 171/83, pulse 49. He  weighs 273 pounds.  HEENT: Is normal.  NECK: Supple.  CHEST: Clear.  CARDIOVASCULAR: Reveals a regular rate and rhythm. There is a 2/6  systolic murmur at the left sternal border.  ABDOMEN: Nontender.  EXTREMITIES: Show no edema.   Electrocardiogram shows a sinus bradycardia at a rate of 49. There is  left ventricular hypertrophy with QRS widening. There is left axis  deviation.   DIAGNOSIS:  1. Status post aortic valve replacement with a prosthetic tissue      valve. He will continue with SBE prophylaxis. We will plan to      repeat an echocardiogram to reassess his valve.  2. Status post aortic root replacement. Note we did perform a CT of      his chest previously to evaluate his ascending aorta. It measured      5.7 x 4.4 cm, but I did  review this with Dr. Cornelius Moras of [facility]. We felt      that the root was measured at the time of Valsalva and he felt that      the graft was stable and did not require further followup.  3. Hypertension: His blood pressure is elevated today. I have asked      him to resume his enalapril at 20 mg p.o. daily. In one week, we      will check a BMET. I also will check lipids and liver at that time      as well as a CBC.  4. Coumadin use. He is being followed in our Coumadin Clinic with goal      of 2-3.  5. History of paroxysmal atrial fibrillation. He will continue on his      Toprol and Coumadin.  6. Hyperlipidemia. We will check lipids and liver and continue with      his Niaspan and  statin.  7. Chest pain: His symptoms are somewhat concerning, although      infrequent. I will schedule him for a Myoview for risk      stratification. If he shows normal effusion, we will not pursue      further cardiac workup.  8. We will see him back in nine months.     Madolyn Frieze Jens Som, MD, [facility]  Electronically Signed    BSC/MedQ  DD: 05/28/2007  DT: 05/28/2007  Job #: [id number]   cc:   Fanny Dance. Rankins, M.D.

## 2010-11-14 NOTE — Discharge Summary (Signed)
Harold Jones, Harold Jones           ACCOUNT NO.:  [id number]   MEDICAL RECORD NO.:  [id number]          PATIENT TYPE:  OBV   LOCATION:  4741                         FACILITY:  [facility]   PHYSICIAN:  Madolyn Frieze. Jens Som, MD, FACCDATE OF BIRTH:  04/14/1928   DATE OF ADMISSION:  09/09/2007  DATE OF DISCHARGE:  09/10/2007                               DISCHARGE SUMMARY   PRIMARY CARDIOLOGIST:  Dr. Olga Millers   PRIMARY CARE PHYSICIAN:  [facility].   PROCEDURE PERFORMED DURING HOSPITALIZATION:  None.   FINAL DISCHARGE DIAGNOSES:  1. Chest pain with negative enzymes.  2. History of aortic valve repair with aortic replacement.  3. Paroxysmal atrial fibrillation.  4. Chronic Coumadin treatment.  5. Hypertension.  6. Hypercholesterolemia.  7. Diabetes.  8. Bradycardia.   HOSPITAL COURSE:  This is a 77 year old male patient with complaints of  chest pain x2 days.  The patient states the chest pain had gotten some  better, but he went to [facility] in order to evaluate further.  He was  found to be bradycardic with a heart rate in the 40s.  He was sent to  [facility] for workup.  The patient also had complaints  of coughing.  He has described the chest pain as tightness over the left  breast.  It was localized with associated shortness of breath, has  otherwise negative symptoms.  He does admit to having chest discomfort  worsening with walking or movement.  He also had some complaints of  episodic abdominal pain with positive nausea and vomiting.  As a result,  the patient was admitted to rule out cardiac etiology for chest  discomfort.  He did have an adenosine completed in the past revealing an  EF of 65% with no evidence of ischemia.  The patient was monitored.  His  metoprolol was discontinued secondary to bradycardia and all other meds  were continued.  Cardiac panel was found to be negative with troponin of  0.02, 0.02, and 0.01 respectively.  The  patient's  blood pressure was  maintained without the use of Toprol, but his lisinopril was increased  to 40 mg 1 p.o. q. day.  The patient was seen and examined by Dr. Olga Millers on day of discharge and found to be stable with no further  complaints.  Blood pressure was found to be 104/72 with a heart rate of  82 on discharge.   DISCHARGE LABORATORIES:  Cholesterol 81, lipids 81, HDL 19, LDL 46,  sodium 138, potassium 3.6, chloride 103, CO2 is 28, glucose 154, BUN 11,  creatinine 0.94, PTT 32, PT 27.5, INR 2.5, TSH was normal at 0.532,  magnesium 1.8, hemoglobin 13.9, hematocrit 39.8, white blood cells 5.2,  platelets 156.  Discharge EKG reveals a normal sinus rhythm with  ventricular rate of 80 beats per minute.   DISCHARGE MEDICATIONS:  1. Enalapril 40 mg 1 p.o. q. day (increased from 20 mg 1 p.o. q. day.)  2. Zocor 40 mg daily.  3. Aspirin 81 mg daily.  4. Glucophage 500 mg twice a day.  5. Lasix 20 mg daily.  6. Insulin 40 units at bedtime.  7. Niacin 2000 mg at bedtime.  8. Coumadin 6 mg on Mondays and 4.5 mg all other days to be adjusted      per Coumadin clinic.   FOLLOWUP PLANS AND APPOINTMENT:  1. The patient is to follow with Dr. Olga Millers on Friday, March      27 at 2:15 p.m.  2. The patient is to follow in the Coumadin clinic on Tuesday at 10      a.m.  3. The patient has been advised to contact our office for recurrence      of chest discomfort.  4. The patient has been advised he is not to take Toprol and that he      has had an increase in his enalapril dosing.  Followup appointment.   Time spent with the patient to include physician time 35 minutes.      [name]. Lyman Bishop, NP      Madolyn Frieze. Jens Som, MD, [facility]  Electronically Signed    KML/MEDQ  D:  09/10/2007  T:  09/10/2007  Job:  [id number]   cc:   Melvern Banker

## 2010-11-14 NOTE — Assessment & Plan Note (Signed)
[facility]                            CARDIOLOGY OFFICE NOTE   NAME:Holik, Sudeep                    MRN:          [id number]  DATE:02/24/2007                            DOB:          1928/01/27    When I saw Mr. Tiggs previously, I ordered a CT to reassess his  aortic root.  There was an aortic valve replacement with a graft in the  ascending aorta.  It was measured at 5.7 x 4.4 cm.  I did review this  with Dr. Cornelius Moras.  We felt that the aortic root was measured at the sinus  of Valsalva.  He felt the graft was stable and did not require further  followup.     Madolyn Frieze Jens Som, MD, [facility]  Electronically Signed    BSC/MedQ  DD: 02/24/2007  DT: 02/25/2007  Job #: [id number]

## 2010-11-14 NOTE — H&P (Signed)
Harold Jones, GUDERIAN NO.:  [id number]   MEDICAL RECORD NO.:  [id number]          PATIENT TYPE:  EMS   LOCATION:  MAJO                         FACILITY:  [facility]   PHYSICIAN:  Madolyn Frieze. Jens Som, MD, FACCDATE OF BIRTH:  10-18-1927   DATE OF ADMISSION:  09/09/2007  DATE OF DISCHARGE:                              HISTORY & PHYSICAL   PRIMARY CARDIOLOGIST:  Dr. Olga Millers.   PRIMARY CARE PHYSICIAN:  [facility].   Mr. Thebeau is a very pleasant 77 year old gentleman from [place] who  does not speak any [place].  H&P is being done via interpreter.  He has  a past medical history of AVR and aortic root replacement, paroxysmal  atrial fibrillation, hypertension, diabetes, high cholesterol who  presents with chest pain and decreased heart rate.  Mr. Fleer states  he began having chest discomfort about two days ago today.  Today it was  resolved, but he went to [facility] for an appointment and told them  about his chest discomfort.  There EKG was done that showed a heart rate  in the 40's.  He was sent here to the emergency room for further workup.  Difficult to obtain history secondary to communication barrier.  Apparently Mr. Rochford has also had a productive cough of thick white  sputum, although he denies any fever or chills.  Initially denied any  nausea or vomiting, but later stated Saturday night he had an episode of  abdominal pain and did have some nausea and vomiting after dinner.  The  chest discomfort he describes as a tightness has localized at his left  breast.  It is associated with shortness of breath and no other  symptoms.  He states it is worse with movement and walking.  It has been  intermittent for two days.  It lasts around 10-15 minutes when it  occurs.  He is not taking anything to relieve the discomfort, although  he denies it is different from the chest discomfort he had prior to his  catheterization in the past.  Last saw Dr. Jens Som  in November 2008.  The patient was noted to be sinus brady at a rate of 49 at that visit  with LVH with QRS widening and left axis deviation.  He underwent an  echocardiogram that showed an EF of 60%.  Adenosine stress Myoview that  showed EF of 65% without ischemia, some thinning inferior wall thought  to be secondary to diaphragmatic attenuation.   PAST MEDICAL HISTORY:  1. Insulin-dependent diabetes.  2. Bioprosthetic aortic valve done approximately five years ago at      [facility].  Also with aortic root replacement.  3. Questionable history of CVA, questionable history of CHF prior to      his AVR, coronary artery disease mild by cardiac catheterization in      2004.  Note this cath was done prior to his valve surgery.  4. Glaucoma.  5. Paroxysmal atrial fibrillation.  6. Osteoarthritis.  7. Hypertension.  8. Dyslipidemia.   ALLERGIES:  No known drug allergies.   CURRENT MEDICATIONS:  1. Aspirin 81.  2. Enalapril 20.  3. Metformin 500 b.i.d.  4. Lasix 20.  5. Lantus 46 units q.h.s.  6. Niaspan 2000 mg q.h.s.  7. Zocor 40.  8. Toprol XL 50.  9. Coumadin 3 mg or as instructed by the Coumadin Clinic.   SOCIAL HISTORY:  The patient lives in [place] with his wife.  He has  adult children.  Denies any tobacco, EtOH, drug or herbal medication  use.   FAMILY HISTORY:  Noncontributory at this time.   REVIEW OF SYSTEMS:  Positive for chest pain, dyspnea on exertion, cough,  increased nocturia, increased frequency with urination, generalized  weakness, nausea, vomiting, abdominal pain x1 episode Saturday.  All  other systems reviewed and negative.   PHYSICAL EXAMINATION:  VITAL SIGNS:  Temperature 98.1, heart rate  recorded at 45 by EKG, respirations 20, blood pressure 167/84, 175/84.  GENERAL:  In no acute distress.  HEENT:  Normal.  NECK:  Supple without lymphadenopathy.  No bruits.  No JVD.  CARDIOVASCULAR:  Reveals S1, S2.  LUNGS:  Clear to auscultation  bilaterally.  SKIN:  Warm and dry.  ABDOMEN:  Soft, nontender, positive bowel sounds.  Lower extremities  without clubbing, cyanosis or edema.  NEUROLOGICAL:  Alert and oriented  x3.   Chest x-ray reading is pending.  EKG shows sinus brady with LVH, lateral  T-wave inversions; otherwise, no acute findings.   LABORATORY DATA:  Lab work is pending.   IMPRESSION:  1. Chest pain without acute findings by EKG or clinical exam.  2. Bradycardia.  The patient on beta blocker.  Dr. Olga Millers in      to examine and assess patient.  History noted recent workup with      negative stress Myoview, normal echocardiogram.   PLAN:  DC his Toprol secondary to decreased heart rate, increased  enalapril to 40.  Admit for observation.  Rule out and follow up  outpatient.     Dorian Pod, ACNP      Madolyn Frieze. Jens Som, MD, [facility]  Electronically Signed   MB/MEDQ  D:  09/09/2007  T:  09/10/2007  Job:  [id number]

## 2010-11-14 NOTE — Assessment & Plan Note (Signed)
[facility]                            CARDIOLOGY OFFICE NOTE   NAME:Barrales, Baylen                    MRN:          [id number]  DATE:03/03/2008                            DOB:          [birth year]    Mr. Sliwa is a 77 year old gentleman who has a history of aortic  valve disease status post previous bioprosthetic valve as well as  replacement of thoracic aneurysm, paroxysmal atrial fibrillation,  hypertension, diabetes, and hyperlipidemia.  When I last saw him on December 24, 2007, he was complaining of chest pain.  We did schedule him to have  a Myoview on December 30, 2007.  His ejection fraction was 60%.  There is  mild pain of the inferior wall, but no ischemia was noted.  Since that  time, he has not had chest pain, shortness of breath, palpitations, or  syncope.  There is no pedal edema.   MEDICATIONS:  1. Enalapril 20 mg p.o. daily.  2. Zocor 40 mg p.o. daily.  3. Lasix 20 mg p.o. daily.  4. Insulin.  5. Aspirin 81 mg p.o. daily.  6. Niaspan 2 g p.o. at bedtime.  7. Toprol 100 mg p.o. daily.   PHYSICAL EXAMINATION:  VITAL SIGNS:  Today, shows a blood pressure of  152/83 and his pulse of 71.  Weight is 207 pounds.  HEENT:  Normal.  NECK:  Supple.  CHEST:  Clear.  CARDIOVASCULAR:  Regular rate and rhythm.  There is 2/6 systolic murmur  in the left sternal border.  ABDOMEN:  No tenderness.  EXTREMITIES:  No edema.   DIAGNOSES:  1. Recent atypical chest pain - his Myoview showed inferolateral      thinning, but no ischemia.  He has had no further chest pain.  We      will not pursue this further.  2. History of atrial fibrillation - he will continue on his Toprol.      He did have a gastrointestinal bleed in the past, but he has had no      further problems with this.  I will resume his Coumadin with a goal      INR of 2-3.  We will check his CBC in 2 weeks.  We will reestablish      him with a Coumadin Clinic.  3. Status post aortic  valve replaced with a prosthetic tissue valve -      he will continue with subacute bacterial endocarditis prophylaxis.  4. History of aortic replacement - it has been dilated on      echocardiograms, but I have reviewed this with Dr. Cornelius Moras in the past      and he feels that it does not require further followup as the      dilatation was at the time of Valsalva.  5. Hypertension - his blood pressure is mildly elevated - we will      track this and add medications as indicated.  6. History of hyperlipidemia - continue statin and Niaspan.   We will see him back in approximately 6 months.     Madolyn Frieze Jens Som, MD,  [facility]  Electronically Signed    BSC/MedQ  DD: 03/03/2008  DT: 03/04/2008  Job #: [id number]   cc:   Melvern Banker

## 2010-11-14 NOTE — Assessment & Plan Note (Signed)
[facility]                            CARDIOLOGY OFFICE NOTE   NAME:Jones Jones                    MRN:          [id number]  DATE:09/26/2007                            DOB:          03-Aug-1927    Jones Jones is a 77 year old male with past medical history of aortic  valve replacement (bioprosthetic valve), replacement of thoracic  aneurysm, paroxysmal atrial fibrillation, hypertension, diabetes and  hyperlipidemia, who presents in followup today.  He was recently  admitted to [facility] secondary to mild bradycardia and chest  pain.  He did rule out for myocardial infarction with serial enzymes.  A  Myoview was performed and showed an ejection fraction of 65% with no  ischemia.  He was therefore discharged.  Note, his liver functions were  normal at that time of the admission.  Since discharge, he apparently  has not been doing well.  He has been reclusive.  Over the past 4-5  days, he has had some dizziness and weakness with standing that improves  with lying.  He has also had melena and today hematochezia.  There have  also been some diffuse myalgias.  He has also apparently had some  fevers.  He has not had chest pain or shortness of breath by his report.  However, as he was walking into the clinic, he was very dyspneic, and he  was very pale-looking as well.   MEDICATIONS:  1. Aspirin 325 mg p.o. daily.  2. Fortamet ER 500 mg p.o. daily.  3. Niaspan 1 g p.o. daily.  4. Zocor 40 mg p.o. daily.  5. Toprol 50 mg p.o. daily.  6. Lasix 20 mg p.o. daily.  7. Insulin and Coumadin as directed.  Note, his INR in the clinic is      7.7.   PHYSICAL EXAM:  Today shows a blood pressure of 138/77, and his pulse  107.  He is well-developed and mildly diaphoretic.  He appears to be in mild  distress.  HEENT:  Significant for pallor in eyelids.  NECK:  Supple.  CHEST:  Clear.  CARDIOVASCULAR:  Tachycardic rate and an irregular rhythm.   There is a  2/6 systolic murmur at the left sternal border.  ABDOMEN:  No tenderness to palpation, and I cannot appreciate  hepatosplenomegaly.  He has 2+ femoral pulses bilaterally, and there are  no bruits noted.  RECTAL:  Bright red blood.  EXTREMITIES:  No edema, and I can palpate no cords.  He has 2+ posterior  tibial pulses bilaterally.  NEUROLOGIC:  Grossly intact.   His electrocardiogram shows probable sinus tachycardia with frequent  PVCs.  He has left ventricle hypertrophy with repolarization  abnormalities.   DIAGNOSES:  1. Probable gastrointestinal bleed - the patient appears to be      significantly uncomfortable.  He is mildly diaphoretic and      extremely pale and is also short of breath with exertion.  He is      having dizziness with standing as well.  We will transfer him to      the emergency room via EMS.  Will keep him n.p.o. except      medications.  We will check laboratories including a      hemoglobin/hematocrit.  We will also check an INR, and he will need      to be reversed in terms of his Coumadin with vitamin K and fresh      frozen plasma.  We will also need to check serial hemoglobin and      hematocrits.  We will need to have good IV access and begin IV      Protonix.  Gastroenterology will need to be consulted.  He will      most likely need to be transfused as well.  He also needs to have      other laboratories checked.  I have discussed the patient with Dr.      Hannah Jones of [facility] who will admit the patient.  I appreciate      the help with his care.  Note, we will also hold his aspirin.  We      will also hold his blood pressure medications for now.  2. Status post aortic valve replacement with a prosthetic tissue valve      - he will need subacute bacterial endocarditis prophylaxis in the      future.  3. Status post aortic root replacement.  4. Hypertension - we are holding his blood pressure medicines due to      his probable  gastrointestinal bleed.  5. Coumadin therapy - this is managed in our Coumadin Clinic.  His INR      was 7.7 when checked today, and this will most likely need to be      reversed given his ongoing gastrointestinal bleed.  6. Paroxysmal atrial fibrillation - he will need to be on telemetry.      We will resume his beta blocker when he is tolerant.  His Coumadin      is being reversed.  7. Hyperlipidemia - he will continue on his Niaspan and his statin      after his gastrointestinal issues are complete.     Jones Jones Harold Som, MD, [facility]  Electronically Signed    BSC/MedQ  DD: 09/26/2007  DT: 09/27/2007  Job #: [id number]   cc:   Jones Jones, M.D.

## 2010-11-14 NOTE — Assessment & Plan Note (Signed)
[facility]                            CARDIOLOGY OFFICE NOTE   NAME:Harold Jones, Harold Jones                    MRN:          [id number]  DATE:10/20/2007                            DOB:          1927-08-29    Harold Jones is a very pleasant 77 year old gentleman who has a history  of aortic valve replacement (bioprosthetic valve), paroxysmal atrial  fibrillation, replacement of thoracic aneurysm, hypertension, diabetes,  hyperlipidemia who returns for follow-up today. Note, a previous Myoview  recently showed an ejection fraction of 65% and no ischemia.  His most  recent echo was on June 16, 2007 and showed normal LV function.  There was a tissue AVR with mild periprosthetic leak.  There was  moderate aortic root dilatation. There was mild left atrial enlargement.  There was mild tricuspid regurgitation.  When I saw him last on March  27, he was having a GI bleed and we admitted him to [facility].  He was cared for on the internal medicine service and the  gastroenterologist.  He apparently had endoscopy which showed some  bleeding from his colon but I do not have any of those records  available.  Since discharge, he has done well. There is no dyspnea,  chest pain, palpitations or syncope.  There is no pedal edema.   MEDICATIONS:  1. Enalapril 20 mg p.o. daily.  2. Zocor 40 mg  p.o. daily.  3. Lasix 40 mg tablets 1/2 p.o. daily.  4. Insulin.  5. Aspirin 81 mg p.o. daily.  6. Niaspan 2 grams p.o. q.h.s.  7. Fortamet ER 500 mg p.o. b.i.d.  8. Toprol 100 mg p.o. daily.  9. Nu-Iron.   PHYSICAL EXAM:  Blood pressure of 130/68 and his pulse is 59.  He weighs  260 pounds.  HEENT:  Normal.  NECK:  Supple.  CHEST:  Clear.  CARDIOVASCULAR:  Regular rate and rhythm. There is 2/6 systolic murmur  at the left sternal border.  ABDOMEN:  No tenderness.  EXTREMITIES:  Show no edema.   DIAGNOSIS:  1. Recent gastrointestinal bleed - Mr.  Jones is much better today.      We will have his records from his recent hospitalization forwarded      to [place] including his endoscopy reports.  For now he will stay off of      his Coumadin. I will check a CBC today.  2. Status post aortic valve replacement with a prosthetic tissue valve      - he will continue with SBE prophylaxis.  3. History of aortic root replacement - this does not require further      follow-up at this point.  4. Hypertension - his blood pressure is adequately controlled.  5. Coumadin therapy - he is now off of this.  We will consider      resuming this in late June given his history of paroxysmal atrial      fibrillation as long as there is no other GI problems.  6. Paroxysmal atrial fibrillation - he remains in sinus rhythm.  He      will continue  with his beta blocker.  7. Hyperlipidemia - he will continue on his Niaspan and his statin.   We will see him back at the end of June.     Madolyn Frieze Jens Som, MD, [facility]  Electronically Signed    BSC/MedQ  DD: 10/20/2007  DT: 10/20/2007  Job #: [id number]   cc:   Jordan Hawks. Elnoria Howard, MD

## 2010-11-14 NOTE — H&P (Signed)
Harold Jones, Harold Jones NO.:  [id number]   MEDICAL RECORD NO.:  [id number]          PATIENT TYPE:  EMS   LOCATION:  ED                           FACILITY:  [facility]   PHYSICIAN:  Isidor Holts, M.D.  DATE OF BIRTH:  20-May-1928   DATE OF ADMISSION:  09/26/2007  DATE OF DISCHARGE:                              HISTORY & PHYSICAL   The patient is unassigned to [place] [employer].   PRIMARY MEDICAL DOCTOR:  Turkey R. Rankins, M.D., [facility].   PRIMARY CARDIOLOGIST:  Madolyn Frieze. Jens Som, MD, [facility].   CHIEF COMPLAINT:  Passage of black stools for the past 4 to 5 days,  associated with intermittent crampy abdominal pain, also progressive  weakness for the last 2 weeks, worse in the last 2 days.  Dizziness,  particularly postural, for the past 2 days.  Vomited x2 today.   HISTORY OF PRESENT ILLNESS:  This is a 77 year old male.  For past  medical history, see below.  The patient is [place] and has a poor  command of English; therefore, history was obtained via interpreter, who  was present in the emergency room.  Symptoms, however, were as outlined  above.  The patient denies NSAID use.  Denies fever, chills, chest pain,  although has experienced shortness of breath on exertion.  Denies ankle  swelling.   PAST MEDICAL HISTORY:  1. Type 2 diabetes mellitus, insulin requiring.  2. History of bioprosthetic aortic valve replacement approximately 5      years ago at [facility].  Aortic root replacement was done at the      same time.  3. Mild coronary artery disease by cardiac catheterization in 2004.  4. Congestive cardiomyopathy.  5. Questionable history of prior CVA.  6. Glaucoma.  7. Paroxysmal atrial fibrillation.  8. Osteoarthritis.  9. Hypertension.  10.Dyslipidemia.  11.Status post admission September 09, 2007 through September 10, 2007, for      symptomatic bradycardia.  This was deemed secondary to beta      blocker, which was therefore discontinued.   MEDICATION HISTORY:  1. Aspirin 81 mg p.o. daily.  2. Enalapril 40 mg p.o. daily.  3. Fortamet 500 mg p.o. b.i.d.  4. Lasix 20 mg p.o. daily.  5. Lantus 40 units subcu q.h.s.  6. Niacin 2000 mg p.o. q.h.s.  7. Zocor 40 mg p.o. daily.  8. Coumadin per INR.   ALLERGIES:  No known drug allergies.   REVIEW OF SYSTEMS:  As per HPI and chief complaint, otherwise negative.   SOCIAL HISTORY:  The patient resides in [place] with his wife.  As  mentioned above, he is [place] and has three offspring.  Nonsmoker,  nondrinker.  Has no history of drug abuse.   FAMILY HISTORY:  The patient's father is deceased in his 44s from heart  disease.  His mother is also deceased from old age.  He has one brother  who has heart disease.  Family history is otherwise noncontributory.   PHYSICAL EXAMINATION:  VITALS:  Temperature 98.8, pulse 85 per minute  and regular, respiratory rate 18, blood pressure 117/65 mmHg, pulse  oximeter 95% on  room air.  GENERAL:  The patient does not appear to be in obvious acute distress.  Alert, communicative.  Not short of breath at rest.  HEENT:  Moderate pallor.  No jaundice, no conjunctival injection.  Throat is clear.  NECK:  Supple.  JVD not seen.  No palpable lymphadenopathy.  No palpable  goiter.  CHEST:  Clinically clear to auscultation.  No wheezes or crackles.  Median sternotomy scar is noted.  HEART:  Heart sounds 1 and 2 heard.  Normal, regular.  No murmurs.  ABDOMEN:  Full, soft, and nontender.  No palpable organomegaly.  No  palpable masses.  Normal bowel sounds.  EXTREMITIES:  Lower extremity examination:  No pitting edema.  Palpable  peripheral pulses.  MUSCULOSKELETAL:  Osteoarthritic changes are noted.  CENTRAL NERVOUS SYSTEM:  No focal neurologic deficit on gross  examination.   INVESTIGATIONS:  CBC:  WBC 9.9, hemoglobin 7.3, hematocrit 20.5, MCV  92.3, platelets 176, INR 4.4, APTT 29 seconds.  Electrolytes:  Sodium  139, potassium 4.4, chloride  107, CO2 17, BUN 23, creatinine 1.40,  glucose 431.  AST 30, ALT 17, alkaline phosphatase 30.  Fecal occult  blood test is positive.  A 12-lead EKG dated September 26, 2007, shows sinus  rhythm, occasional PVCs, ventricular rate 107, left axis deviation, old  Q-waves in V1-V2.  No acute ischemic changes.   ASSESSMENT AND PLAN:  1. Gastrointestinal bleed.  In view of history of melena, this is      likely upper gastrointestinal bleed.  Certainly the patient will      need GI consultation for endoscopy, to elucidate the source.      Meanwhile will commence the patient on twice-daily proton pump      inhibitor,  discontinue NSAIDs and Coumadin.   1. Acute blood loss anemia.  This is secondary to #1 above, and is      symptomatic.  We shall transfuse 2 units PRBCs.  Will type and      cross match 4 units, and hold.   1. Coagulopathy.  This is likely the culprit for #1 above, with some      possible underlying GI lesion.  This is secondary to Coumadin.  We      shall therefore hold Coumadin.  The patient has already been given      vitamin K once in the emergency department.  We shall transfuse      with 2 units fresh frozen plasma.  Furthermore, continued      anticoagulation may have to be reevaluated during this      hospitalization, in view of #1 above.   1. History of paroxysmal atrial fibrillation.  The patient is      currently in sinus rhythm.  We shall monitor.   1. History of hypertension.  The patient is normotensive at present.      As blood pressure is borderline, we shall hold antihypertensive      medication and observe for now.   1. Type 2 diabetes mellitus.  This appears uncontrolled.  We shall      commence the patient on sliding scale insulin coverage.   Further management will depend on clinical course.      Isidor Holts, M.D.  Electronically Signed     CO/MEDQ  D:  09/26/2007  T:  09/26/2007  Job:  [id number]   cc:   Fanny Dance. Rankins, M.D.  Fax:  [phone]   Madolyn Frieze. Jens Som, MD, [facility]  [address]  [address]  [place]  [place] [address]

## 2010-11-14 NOTE — Assessment & Plan Note (Signed)
[facility]                            CARDIOLOGY OFFICE NOTE   NAME:Harold Jones, Harold Jones                    MRN:          [id number]  DATE:12/24/2007                            DOB:          31-Jul-1927    HISTORY OF PRESENT ILLNESS:  Harold Jones is a gentleman that I follow  for aortic valve disease (he has had a previous bioprosthetic aortic  valve), paroxysmal atrial fibrillation, replacement of thoracic  aneurysms, hypertension, diabetes, and hyperlipidemia.  Since I last saw  Harold Jones, he apparently was having chest pain and taking nitroglycerine.  The  history is somewhat difficult due to the language barrier, but through a  translator, the  pain is in the left breast area.  It is described as  lasting a moment.  It radiates down his left upper extremity.  There  is a stabbing pain.  It is not positional nor is it exertional.  He also  is having some dyspnea on exertion, which is relatively new as well.  However, there is no chest tightness with the exertion.  There is no  orthopnea, PND, or pedal edema.  He does not have any palpitations or  syncope.  Note, there has been no melena or hematochezia.  He is having  some fatigue.   MEDICATIONS:  1. Enalapril 20 mg p.o. daily.  2. Zocor 40 mg p.o. daily.  3. Lasix 20 mg p.o. daily.  4. Insulin.  5. Aspirin 81 mg p.o. daily.  6. Niaspan 2 g p.o. daily.  7. Toprol 100 mg p.o. daily.   PHYSICAL EXAMINATION:  Physical exam today shows a blood pressure of  98/62.  His pulse is 70 and weight is 206 pounds.  HEENT:  Normal.  NECK:  Supple.  CHEST:  Clear.  CARDIOVASCULAR:  Regular rate and rhythm.  There is a 2/6 systolic  murmur at the left sternal border.  There is no diastolic murmur noted.  ABDOMEN:  No tenderness.  EXTREMITIES:  No edema.   His electrocardiogram shows a sinus rhythm at a rate of 70.  There are  frequent PVCs.  There are no significant ST changes noted.   DIAGNOSES:  1.  Atypical chest pain - Harold Jones has complained of chest pain      that sounds somewhat atypical.  His electrocardiogram shows no ST      changes.  We will plan to repeat his Myoview particularly in light      of his increased shortness of breath as well.  If it shows      ischemia, then he will most likely require a cardiac      catheterization.  2. Dyspnea - aspirin #1.  We did second electrocardiogram on Harold Jones most      recently in December 2008.  At that time, he had normal left      ventricular function.  There was a tissue aortic valve replacement      with a mild periprosthetic leak and mild increase in gradient.      There was moderate aortic root dilatation.  3. Status post aortic valve replacement with  a prosthetic tissue valve      - new.  Continue with subacute bacterial endocarditis prophylaxis.  4. History of aortic root replacement - it was dilated previously on      his echocardiogram.  I did review this with Dr. Cornelius Moras, and he felt      this was measured at the sinus of Valsalva and did not require      further followup.  5. Hypertension - his blood pressure is adequately controlled.  6. Coumadin therapy - he is now off this due to his gastrointestinal      bleed.  We will reconsider starting this in the future, but his      gastrointestinal bleed was significant recently.  7. Recent gastrointestinal bleed - we will plan to check a CBC today.      I will also check a BMET, lipids and liver, TSH, and BNP.  8. History of paroxysmal atrial fibrillation - he remains in sinus      rhythm today, and we will continue his beta-blocker.  9. Hyperlipidemia - he will continue on his Niaspan and statin.   I will see Harold Jones back in 6 weeks.     Madolyn Frieze Jones Som, MD, [facility]  Electronically Signed    BSC/MedQ  DD: 12/24/2007  DT: 12/25/2007  Job #: [id number]   cc:   Duke Salvia, MD, [facility]  [facility]

## 2010-11-17 NOTE — Assessment & Plan Note (Signed)
[facility]                              CARDIOLOGY OFFICE NOTE   NAME:Innocent, Harold                    MRN:          [id number]  DATE:05/06/2006                            DOB:          09/10/1927    Harold Jones returns for followup today.  He has a history of an aortic  valve replacement with a bioprosthetic valve as well as replacement of his  aortic root.  Since I last saw him he did have an echocardiogram in March.  He was found to have normal LV function.  There was mild aortic root  dilatation.  There was a mechanical aortic valve prosthesis with mild aortic  insufficiency and the mean gradient was 18 mmHg.  We also did an abdominal  ultrasound that showed no aneurysm.  His most recent nuclear study was  performed in December 2006.  There was mild diaphragmatic attenuation but  there was no ischemic and his LV function was normal.  Since I last saw him  he has had brief episodes of chest pain but nothing sustained.  There is no  exertional chest pain and there is no dyspnea.  There is no pedal edema.   His medications include:  1. Fortamet ER 1000 mg p.o. daily.  2. Aspirin 325 mg p.o. daily.  3. Niaspan 1 g p.o. daily.  4. Enalapril 20 mg p.o. daily.  5. Zocor 40 mg p.o. q.h.s.  6. Metoprolol ER 50 mg p.o. daily.  7. Lasix 20 mg p.o. daily.  8. Lantus insulin.   PHYSICAL EXAMINATION:  VITAL SIGNS:  Shows a blood pressure of 178/81 and  his pulse is 60.  NECK:  Supple and there are no bruits.  CHEST:  Clear.  CARDIOVASCULAR:  Reveals a regular rate and rhythm.  There is a 2/6 systolic  murmur at the left sternal border.  There is no diastolic murmur noted.  EXTREMITIES:  Show no edema.   Electrocardiogram shows a sinus rhythm with a first-degree AV block and left  axis deviation.  There is left ventricular hypertrophy.  There are  nonspecific ST changes.   DIAGNOSES:  1. Status post aortic valve replacement with a prosthetic  tissue valve.  2. Status post aortic root replacement.  3. Hypertension.  4. Diabetes mellitus.  5. Hyperlipidemia.  6. Remote history of atrial fibrillation by report.   PLAN:  Harold Jones is doing well from a symptomatic standpoint.  He  continues to have vague atypical chest pain but it is rare and his recent  nuclear study showed no ischemia.  We will continue with medical therapy.  I  will schedule him to have a CT of his chest to assess the size of his aortic  root.  He will continue with SBE prophylaxis.  His blood pressure is  elevated today and I have asked him to increase his enalapril to 20 mg p.o.  b.i.d.  We will check a CMET and lipids in 1 week.  I have asked him to  return to see [place] in approximately 1 year.    ______________________________  Madolyn Frieze. Jens Som, MD,  [facility]    BSC/MedQ  DD: 05/06/2006  DT: 05/06/2006  Job #: [id number]

## 2010-11-17 NOTE — Discharge Summary (Signed)
NAMEKOWEN, KLUTH           ACCOUNT NO.:  [id number]   MEDICAL RECORD NO.:  [id number]          PATIENT TYPE:  INP   LOCATION:  1424                         FACILITY:  [facility]   PHYSICIAN:  Mobolaji B. Bakare, M.D.DATE OF BIRTH:  [birth year]   DATE OF ADMISSION:  09/26/2007  DATE OF DISCHARGE:  10/02/2007                               DISCHARGE SUMMARY   PRIMARY CARE PHYSICIAN:  Turkey R. Rankins, M.D. with [facility].   CARDIOLOGIST:  Madolyn Frieze. Jens Som, MD, [facility]   FINAL DIAGNOSES:  1. 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  2. Acute blood loss anemia.  3. Paroxysmal atrial fibrillation.  Was in normal sinus rhythm at the      time of discharge.  4. Coagulopathy secondary to Coumadin.  5. Status post aortic valve prosthetic replacement.  6. Diabetes mellitus.  7. Dyslipidemia.  8. Colon polyps.  9. Hypertension.  10.Coronary artery disease.   CONSULTATIONS:  1. Cardiology consult provided by Dr. Jens Som  2. GI consult provided by Dr. Elnoria Howard.   PROCEDURES:  1. Upper endoscopy done by Dr. Claudette Head on September 28, 2007 which      showed normal examination from proximal esophagus to the second      portion of the duodenum.  2. Colonoscopy done by Dr. Jeani Hawking on September 29, 2007 which showed      melena, several cecal AVMs, polyps, sigmoid diverticula, internal      and external hemorrhoids noted in the rectum.  Multiple polyps were      identified throughout the colon.  These were not removed in light      of current GI bleed in the background of coagulopathy.   BRIEF HISTORY:  Please refer to the admission H&P for full details.  In  brief, Mr. Bridge is a 77 year old [place] immigrant.  Communication  with the patient was done via interpreters.  He presented to the  emergency room on September 26, 2007 with history of black stools for 4-5  days prior to hospitalization.  This was associated with crampy  abdominal pain.  He has also been having  progressive weakness for 2  weeks associated with postural dizziness.  He had vomiting 2 days, twice  on the day of admission.  On initial evaluation, the patient was  clinically pale.  He was hemodynamically stable.  Blood pressure 117/65  and heart rate of 85.  Initial laboratory data revealed hemoglobin of  7.3, hematocrit of 20.5.  Fecal occult blood was positive.  INR was 4.4.  PTT was 29 seconds.  Platelets 176.  The patient was admitted for GI  bleed which was felt to be secondary to coagulopathy from Coumadin  therapy.  It should be mentioned that the patient has a history of  atrial fibrillation for which he has been on Coumadin.  He was in normal  sinus rhythm at the time of admission.  Coumadin was held.  The patient  was admitted for further treatment.  GI and cardiology were consulted.   HOSPITAL COURSE:  1. Acute blood loss anemia.  The patient's hemoglobin on admission was  7.3.  He was transfused with 4 units of packed red blood cells.      Coagulopathy was reversed with vitamin K and 2 units of FFPs.  He      was hemodynamically stable at the time of admission, and he      continued to be hemodynamically stable during the course of      hospitalization.  Coumadin was obviously discontinued.  He      underwent upper endoscopy which was unrevealing for source of      bleeding.  On September 29, 2007, he underwent colonoscopy.  Results      noted several cecal AVMs and also multiple polyps throughout the      colon.  These were felt not to be suspicious for malignancy, but      they needed to be removed at a later date.  The site of GI bleed      was felt to be due to several cecal AVMs.  APC was applied to the      areas of the AVMs successfully.  Internal and external hemorrhoids      were noted in the rectum.  The patient was therefore scheduled to      follow up with Dr. Elnoria Howard in 2 weeks' time for scheduled colonoscopy      and removal of polyps.  He did well with blood  transfusion and      reversal of coagulopathy.  Hemoglobin at the time of discharge was      10.6.  PT/INR improved to 17.2/1.4.  The patient was in normal      sinus rhythm during the course of hospitalization.  He will      continue to hold his Coumadin until reevaluated by Dr. Jens Som and      after the colonoscopy and polypectomy.  2. Diabetes mellitus.  This was fairly uncontrolled during the course      of hospitalization.  Lantus was increased to home dose.  Blood      sugar control improved.  3. Hypertension.  This was well controlled during the course of      hospitalization.  In fact, blood pressure medications including      enalapril and Toprol-XL were held in view of GI bleed.  Enalapril      was eventually resumed prior to discharge.   DISCHARGE MEDICATIONS:  1. Aspirin 81 mg daily.  2. Enalapril 20 mg daily.  3. Fortamet 500 mg b.i.d.  4. Lasix 20 mg daily.  5. Lantus 46 units daily.  6. Niaspan 200 mg q.h.s.  7. Simvastatin 40 mg daily.  8. Toprol-XL 50 mg daily.  (Warfarin was held).  1. Nu-Iron 150 mg b.i.d.  2. Xalatan 1 drop OU at night.   DISCHARGE INSTRUCTIONS:  1. Follow up with Dr. Elnoria Howard in 2 weeks, Wednesday, October 15, 2007 at      9:00 a.m. to arrange for outpatient colonoscopy and polypectomy.  2. Follow up with Dr. Jens Som in 3 weeks, October 20, 2007 at 2:30 p.m.   DISCHARGE LABORATORY DATA:  White cells 5.3, hemoglobin 10.3, hematocrit  28.6, platelets 151.  Sodium 136, potassium 4.0, chloride 106,  bicarbonate 23, BUN 8, creatinine 0.96, calcium 8.5.      Mobolaji B. Corky Downs, M.D.  Electronically Signed     MBB/MEDQ  D:  11/23/2007  T:  11/24/2007  Job:  [id number]   cc:   Jordan Hawks. Elnoria Howard, MD  Fax: [phone]   Arlys John  S. Jens Som, MD, [facility]  [address]  [address]  [place]  [place] [address]   Fanny Dance. Rankins, M.D.  Fax: [phone]

## 2010-11-17 NOTE — Discharge Summary (Signed)
NAMESTERLIN, Harold Jones           ACCOUNT NO.:  [id number]   MEDICAL RECORD NO.:  [id number]          PATIENT TYPE:  INP   LOCATION:  6736                         FACILITY:  [facility]   PHYSICIAN:  Nedra Hai M.D. [place]     DATE OF BIRTH:  Nov 01, 1927   DATE OF ADMISSION:  03/25/2005  DATE OF DISCHARGE:  04/04/2005                                 DISCHARGE SUMMARY   ADDENDUM:   HOSPITAL COURSE:  The patient is a 77 year old [place] male who presented to  the emergency department with tachypnea, dyspnea, and fever from an urgent  care clinic after having a chest x-ray that revealed left upper lobe fluffy  infiltrates suggestive of community-acquired pneumonia.  The patient had a  history of atrial fibrillation, diabetes type 2, coronary artery disease,  aortic valvuloplasty, status post CABG, congestive heart failure, with an  ejection fraction of 55-65% and hypertension.  On exam in the emergency  department, the patient was found to have a fever of 103 degrees F, a blood  pressure of 160/87, pulse 127, respirations 24, and saturation of 94% on 4 L  oxygen.  He was in mild distress with tachycardia and tachypnea with coarse  breath sounds and increased crackles on the left side.  The rest of the exam  was unremarkable.   LABORATORY DATA:  His labs on admission were a white count of 12.1,  hemoglobin 14.6, hematocrit 42.6, platelets 161, 91% neutrophils, 3%  lymphocytes, 6% monocytes.  His sodium was 131, potassium 3.6, chloride 103,  bicarb 17.1, BUN 22, creatinine 1.2 and his glucose was 333.  His pH was  7.519, PCO2 21, bicarb 17.1.  Total bilirubin 1.2, total protein 7.8,  albumin 3, AST 23, ALT 14, alk phos 36.  Chest x-ray revealed left upper  lobe, hazy opacity.  His INR was 2.6 on Coumadin and his PTT was 29.   HOSPITAL COURSE:  Problem #1.  Community-acquired pneumonia.  The patient  was initially started on ceftriaxone and azithromycin for community-acquired  pneumonia on  September 24.  He was switched to Zosyn and azithromycin on  hospital day #2 and then switched to Augmentin.  Overall he completed a 10-  day course of antibiotics.  During his hospitalization, he was initially  placed on nasal cannula.  Had to be placed on a face mask and underwent a  short course of BiPAP before being placed back on nasal cannula and  eventually weaned to room air without difficulty.  Also in the course of his  pneumonia, he had a CT scan of his chest performed due to a history of  tobacco use to rule out malignancy.  This course showed questionable lesions  on the right side that were suspicious for TB.  Due to this, a PPD was  placed and the patient was placed on inflation and three sputum samples were  obtained for AFB.  Once we had two negative AFBs, the patient was removed  from respiratory isolation.  The TB test was negative and he continued to  improve on his antibiotics.  The patient did remarkably well.  He remained  stable on room air.  He was afebrile.  His white count was decreasing and he  was discharged home with an Atrovent MDI to be used just symptomatically for  shortness of breath or wheezing.   Problem #2.  Atrial fibrillation.  The patient, throughout his  hospitalization, was rate-controlled on amiodarone and Lopressor.  His  Coumadin, however, did fluctuate during his hospitalization due to  interaction with his antibiotic.  Pharmacy recommended that he be discharged  home on 3 mg per day which was what his regimen was prior to being admitted  to the hospital.  His INR and Coumadin levels will need to be monitored  closely by his outpatient physician.   Problem #3.  Coronary artery disease/congestive heart failure/hypertension.  All three of these remained stable throughout his hospitalization.  However,  we did add enalapril 5 mg to his home medication regimen and cut his Lasix  dose in half from 20 mg to 10 mg due to an increase in  creatinine.   Problem #4.  Diabetes mellitus.  His sugars remained stable throughout his  hospitalization, ranging from the 120s to the 130s.  However, his Lantus was  increased to 28 units and he was covered by sliding scale in the hospital  initially due to an initial glucose value of 333.  However, once his sugars  came down, the sliding scale was not needed and he was maintained on his  glipizide ER and his Lantus 28 units.   Problem #5.  Increased creatinine.  The patient's creatinine peaked at 1.6  on Lasix and enalapril 10.  His Lasix dose was cut in half and the enalapril  was decreased to 5 mg and his creatinine did drop to 1.5 today.  This is  something that would be need to be monitored closely by his outpatient  physician.   Problem #6.  Hyperkalemia.  The patient originally came in on 20 mEq of Kcl.  His was increased throughout his hospitalization.  However, he did become  hyperkalemic and all potassium was held.  He was instructed to not take his  potassium pills until returning to see his outpatient physician at which  time they can draw labs and monitor if he needs to be restarted on his home  dose.   DISCHARGE LABORATORY DATA:  From April 04, 2005, white count 8.3,  hemoglobin 13.3, hematocrit 40.2, platelets 543.  Sodium 136, potassium 4.5,  chloride 108, bicarb 22, BUN 20, creatinine 1.5, glucose 120, calcium 8.9.  PT 19.1, INR 1.6.   FOLLOW UP:  1.  The patient needs to have his Coumadin and INR followed closely in the      outpatient setting.  2.  The patient needs to have his potassium rechecked.  3.  The patient needs to have his creatinine followed in the outpatient      setting.  4.  The patient has an appointment with Dr. Barbaraann Barthel, his primary care      physician at [facility] on Monday, October 9, at 8:45 in the morning.     ______________________________  Neena Rhymes, M.D.    ______________________________ Nedra Hai M.D. [place]    KT/MEDQ   D:  04/04/2005  T:  04/05/2005  Job:  [id number]

## 2010-11-17 NOTE — Discharge Summary (Signed)
NAMEMARIANO, DOSHI           ACCOUNT NO.:  [id number]   MEDICAL RECORD NO.:  [id number]          PATIENT TYPE:  INP   LOCATION:  6736                         FACILITY:  [facility]   PHYSICIAN:  Pearlean Brownie, M.D.DATE OF BIRTH:  [birth year]   DATE OF ADMISSION:  03/25/2005  DATE OF DISCHARGE:  04/04/2005                                 DISCHARGE SUMMARY   INCOMPLETE DICTATION:   DISCHARGE DIAGNOSES:  1.  Community acquired pneumonia.  2.  Atrial fibrillation.  3.  Coronary artery disease/congestive heart failure/hypertension.  4.  Diabetes mellitus.  5.  Increased creatinine.  6.  Hyperkalemia.   PROCEDURES:  None.   DISCHARGE MEDICATIONS:  1.  Glipizide ER 10 mg daily.  2.  Metoprolol 50 mg b.i.d.  3.  Amiodarone 100 mg daily.  4.  Coumadin 3 mg daily.  5.  Aspirin 81 mg daily.  6.  Lantus 28 units daily.  7.  Lasix 10 mg daily.  8.  Enalapril 5 mg daily.  9.  Atrovent inhaler two puffs every 6 hours as needed for wheezing or      shortness of breath.   Dictation ended at this point.      Whitney Post, M.D.    ______________________________  Pearlean Brownie, M.D.    KF/MEDQ  D:  04/04/2005  T:  04/05/2005  Job:  [id number]

## 2010-11-17 NOTE — H&P (Signed)
NAMETREVELLE, MCGURN NO.:  [id number]   MEDICAL RECORD NO.:  [id number]          PATIENT TYPE:  INP   LOCATION:  2901                         FACILITY:  [facility]   PHYSICIAN:  Asencion Partridge, M.D.     DATE OF BIRTH:  1927-07-13   DATE OF ADMISSION:  03/25/2005  DATE OF DISCHARGE:                                HISTORY & PHYSICAL   CHIEF COMPLAINT:  Fever from Friday, could not stand, worse today.   HISTORY OF PRESENT ILLNESS:  The patient is a 77 year old [place] gentleman  who reports subjective fever on Friday with fatigue (today is Sunday, Friday  is two days prior to admission).  Symptoms worsened the day before admission  and the morning of admission, he was confused, had headache, and difficulty  staying on his feet.  He went to Urgent Care where he was found to be  tachypneic, tachycardic, hypertensive with audible rales.  Chest x-ray  showed left upper lobe fluffy infiltrates.   REVIEW OF SYSTEMS:  No chest pain, no shortness of breath, no diarrhea.  Positive confusion, fatigue, headache, and nausea.   PAST MEDICAL HISTORY:  Non-insulin dependent diabetes mellitus, CAD status  post CABG, question of atrial fibrillation, valvular disease,  catheterization in May of 2004, congestive heart failure.   PRIMARY CARE PHYSICIAN:  Turkey R. Rankins, M.D.   CARDIOLOGIST:  Tera Mater Arvilla Market, M.D., [facility].   SOCIAL HISTORY:  From [place], lives at home with his wife.  Does not speak  [place].   MEDICATIONS:  1.  Lantus 18 units at q.4 a.m.  2.  Coumadin 3 mg daily.  3.  Glipizide ER 10 mg daily.  4.  Kay Ciel 20 mEq once per day with Lasix.  5.  Lasix 40 mg 1/2 tablet daily.  6.  Metoprolol 50 mg b.i.d.  7.  Amiodarone 200 1/2 daily.   ALLERGIES:  No known drug allergies.   PHYSICAL EXAMINATION:  GENERAL:  White gentleman on ER stretcher.  Pale,  looks anxious, sweating.  VITAL SIGNS:  103 degrees Fahrenheit, 160/87 blood pressure, 127 pulse,  respirations 24, 94% on 4 liters O2.  HEENT:  NCAT, mild distress, PERRL, EOMI, no LAD.  CARDIOVASCULAR:  Tachycardic, RRR, no murmurs, rubs, or gallops.  LUNGS:  Air entry sounds bilaterally, coarse sounds, increased on the left  with crackles.  ABDOMEN:  Soft, nontender, and nondistended.  Positive bowel sounds.  EXTREMITIES:  No edema.   LABORATORY DATA:  UA; yellow, cloudy, 1.030, 5.5, glucose greater than 1000,  large hemoglobin, small bilirubin, 40 ketones, greater than 300 protein, 1.0  urobilinogen, negative nitrites, negative LE, 7 to 10 RBC granular casts or  morphous urates.  CBC; WBC 12.1, hemoglobin 14.6, hematocrit 42.6, platelets  161, 91% neutrophils, 3% lymphocytes, 6% monocytes.  Lactic acid 2.5.  ISTAT  BMET; sodium 131, potassium 3.6, chloride 103, CO2 21, BUN 22, creatinine  1.2, glucose 333, total bilirubin 1.2, total protein 7.8, albumin 3.0, AST  23, ALT 14, alkaline phosphatase 36.   Chest x-ray; left upper lobe hazy opacity, left side hazy throughout.   INR 2.6 on Coumadin,  PTT 47.   ASSESSMENT:  A 77 year old gentleman with:  1.  Pneumonia - chest x-ray somewhat suspicious for obstructive process.  We      will treat as community-acquired pneumonia for now.  He has been given      Azithromycin and Rocephin in the ED.  CFE most common, but would seem      less likely, however chest x-ray is concerning.  We will repeat chest x-      ray in the a.m. when he is better hydrated and try to get AP and lateral      to further delineate pathology.  Depending on those results, may want to      consider chest CT to look for lung or hilar lesions.  2.  Hematuria by UA - no Foley, no symptomatic urinary tract infection.  We      are somewhat concerned, given his age and urinary trace process.      Possibility of bladder cancer.  If his pneumonia proves to be      obstructive, CT may be prudent.  3.  Hyperglycemia.  The patient has history of diabetes type 2 and is on       Lantus and Glipizide.  Had Lantus this a.m., but not Glipizide.  He has      gotten 8 units of Regular Insulin in the ED.  Suspect he is      hyperglycemic secondary to no p.o. medicines this a.m. as well as      infectious process.  We will check HB A1C to be sure that he is      adequately managed.  4.  Hypertension - continue Metoprolol.  5.  CAV valve replacement history with catheterization.  We will try to      obtain records from [facility]  Continue metoprolol,      amiodarone, Coumadin.  INR therapeutic.      Towana Badger, M.D.    ______________________________  Asencion Partridge, M.D.    JP/MEDQ  D:  03/27/2005  T:  03/28/2005  Job:  [id number]

## 2010-11-17 NOTE — Cardiovascular Report (Signed)
NAMEDILLEN, BELMONTES                       ACCOUNT NO.:  [id number]   MEDICAL RECORD NO.:  [id number]                   PATIENT TYPE:  OIB   LOCATION:  5725                                 FACILITY:  [facility]   PHYSICIAN:  Veneda Melter, M.D.                   DATE OF BIRTH:  [birth year]   DATE OF PROCEDURE:  11/09/2002  DATE OF DISCHARGE:  11/10/2002                              CARDIAC CATHETERIZATION   PROCEDURES PERFORMED:  1. Right heart catheterization.  2. Left heart catheterization.  3. Left ventriculogram.  4. Selective coronary angiography.  5. Aortic root angiogram.   DIAGNOSES:  1. Mild coronary artery disease.  2. Low-normal left ventricular systolic function.  3. Three plus aortic insufficiency.  4. Thoracic aortic aneurysm.  5. Mild pulmonary hypertension.   HISTORY:  The patient is a 77 year old white male from [place] who presents  with progressive shortness of breath and substernal chest discomfort.  The  patient was found to be in new onset atrial fibrillation.  He was  anticoagulated and rate controlled.  Echocardiogram showed overall well-  preserved LV function with significant aortic insufficiency, and severely  dilated aortic root.  This was confirmed by MRI which showed a diameter of  5.4 centimeters.  He presents now for further cardiac assessment.   TECHNIQUE:  Informed consent was obtained.  The patient was brought to the  catheterization lab.  A 6-French arterial and 8-French venous sheath were  placed in the right groin using the modified Seldinger technique.  A 7.5-  [place] PA catheter was advanced from the pulmonary artery.  Appropriate  right-sided hemodynamics were obtained.  A 6-French pigtail catheter was  advanced into the left ventricle and appropriate left-sided hemodynamics  were obtained.  A left ventriculogram was then performed using power  injections of contrast.  The pigtail catheter was brought back in the  descending aorta and an  aortic root angiography performed using power  injections of contrast.  Subsequently 6-French sheath JL4 and JR4 catheters  were used to engage the left and right coronary arteries, and selective  angiography performed in various projections using manual injection of  contrast.  At the termination the case, catheters and sheath were removed.  Manual pressure applied until adequate hemostasis was achieved.  The patient  tolerated the procedure well and was transferred to the floor in stable  condition.   FINDINGS:  RIGHT HEART CATHETERIZATION:  RA mean equals 11, RV equals 40/11,  PA equals 35/18, pulmonary capillary wedge pressure is 23 mean.  Cardiac  output is 3.7 liters per minute.  Cardiac index of 1.8 liters per minute per  sq m by thermodilution.  Cardiac output and index are 3.3 and 1.6 by Fick.  There is no significant mitral valve gradient.   LEFT HEART CATHETERIZATION:  1. Left Main Trunk:  Large-caliber vessel, angiographically normal.  2. LAD:  This is a large-caliber vessel.  It provides a major first diagonal     branch in the proximal segment.  The LAD has a narrowing of 50% in the     mid section at the takeoff of the diagonal branch.  The diagonal branch     has an ostial narrowing of 50% to 70%.  3. Left Circumflex Artery:  Dominant.  This is a large-caliber vessel and     provides a large bifurcating first marginal branch in the proximal     segment, 2 smaller marginal branches in the mid section, and a posterior     descending artery distally.  The left circumflex system has luminal     irregularities.  4. Right Coronary Artery:  Codominant.  This is a small-caliber vessel that     provides a small PDA in the distal segment.  The right coronary artery     has mild irregularities of 30% in the mid section.  5. Left Ventriculogram:  Normal end-systolic and end-diastolic dimensions.     Overall left ventricular function is low-normal.  Ejection fraction is      approximately 50%.  There is no mitral regurgitation.  LV pressure is     157/12, aortic is 144/91, LVEDP equals 18.  6. Aortic Root:  The aortic root is severely dilated.  Dilatation does     appear to extend to the takeoff of the great vessels.  The abdominal     aorta appears to be of normal caliber.  The renal artery is single and     patent bilaterally.  The iliac arteries have mild disease.  There is 2+     to 3+ aortic insufficiency.   ASSESSMENT AND PLAN:  The patient is a 77 year old gentleman who presents  with shortness of breath, substernal chest discomfort, new onset atrial  fibrillation.  The patient has some mild coronary artery disease with  overall well-preserved left ventricular function by angiogram.  This has  been confirmed by echocardiogram.  He does have a severely dilated ascending  aorta with significant aortic insufficiency.  Given the mild left  ventricular dysfunction, consideration should be given towards valvular  replacement with replacement of the aortic root.  This was discussed with  the patient in depth; however, at this point he prefers to continue medical  therapy.  Should his symptoms worsen, he may reconsider.                                               Veneda Melter, M.D.    Melton Alar  D:  11/09/2002  T:  11/10/2002  Job:  [id number]   cc:   Luciana Axe, M.D., [facility]

## 2010-12-04 ENCOUNTER — Ambulatory Visit (INDEPENDENT_AMBULATORY_CARE_PROVIDER_SITE_OTHER): Payer: Medicare Other | Admitting: *Deleted

## 2010-12-04 DIAGNOSIS — Z954 Presence of other heart-valve replacement: Secondary | ICD-10-CM

## 2010-12-04 DIAGNOSIS — I359 Nonrheumatic aortic valve disorder, unspecified: Secondary | ICD-10-CM

## 2010-12-04 DIAGNOSIS — I4891 Unspecified atrial fibrillation: Secondary | ICD-10-CM

## 2010-12-04 DIAGNOSIS — I6350 Cerebral infarction due to unspecified occlusion or stenosis of unspecified cerebral artery: Secondary | ICD-10-CM

## 2010-12-22 ENCOUNTER — Other Ambulatory Visit: Payer: Self-pay | Admitting: Cardiology

## 2010-12-25 ENCOUNTER — Other Ambulatory Visit: Payer: Self-pay | Admitting: Cardiology

## 2011-01-01 ENCOUNTER — Ambulatory Visit (INDEPENDENT_AMBULATORY_CARE_PROVIDER_SITE_OTHER): Payer: Medicare Other | Admitting: *Deleted

## 2011-01-01 DIAGNOSIS — I4891 Unspecified atrial fibrillation: Secondary | ICD-10-CM

## 2011-01-01 DIAGNOSIS — I6350 Cerebral infarction due to unspecified occlusion or stenosis of unspecified cerebral artery: Secondary | ICD-10-CM

## 2011-01-01 DIAGNOSIS — Z954 Presence of other heart-valve replacement: Secondary | ICD-10-CM

## 2011-01-01 DIAGNOSIS — I359 Nonrheumatic aortic valve disorder, unspecified: Secondary | ICD-10-CM

## 2011-01-29 ENCOUNTER — Other Ambulatory Visit: Payer: Self-pay | Admitting: Cardiology

## 2011-01-29 ENCOUNTER — Ambulatory Visit (INDEPENDENT_AMBULATORY_CARE_PROVIDER_SITE_OTHER): Payer: Medicare Other | Admitting: *Deleted

## 2011-01-29 DIAGNOSIS — I6350 Cerebral infarction due to unspecified occlusion or stenosis of unspecified cerebral artery: Secondary | ICD-10-CM

## 2011-01-29 DIAGNOSIS — I359 Nonrheumatic aortic valve disorder, unspecified: Secondary | ICD-10-CM

## 2011-01-29 DIAGNOSIS — Z954 Presence of other heart-valve replacement: Secondary | ICD-10-CM

## 2011-01-29 DIAGNOSIS — I4891 Unspecified atrial fibrillation: Secondary | ICD-10-CM

## 2011-01-29 LAB — POCT INR: INR: 3.3

## 2011-02-26 ENCOUNTER — Other Ambulatory Visit: Payer: Self-pay

## 2011-02-26 ENCOUNTER — Ambulatory Visit (INDEPENDENT_AMBULATORY_CARE_PROVIDER_SITE_OTHER): Payer: Medicare Other | Admitting: *Deleted

## 2011-02-26 DIAGNOSIS — I6350 Cerebral infarction due to unspecified occlusion or stenosis of unspecified cerebral artery: Secondary | ICD-10-CM

## 2011-02-26 DIAGNOSIS — I4891 Unspecified atrial fibrillation: Secondary | ICD-10-CM

## 2011-02-26 DIAGNOSIS — I359 Nonrheumatic aortic valve disorder, unspecified: Secondary | ICD-10-CM

## 2011-02-26 DIAGNOSIS — Z954 Presence of other heart-valve replacement: Secondary | ICD-10-CM

## 2011-02-26 LAB — POCT INR: INR: 4.3

## 2011-02-26 MED ORDER — WARFARIN SODIUM 3 MG PO TABS: ORAL_TABLET | ORAL | Status: DC

## 2011-03-26 LAB — CROSSMATCH
ABO/RH(D): AB POS
Antibody Screen: NEGATIVE

## 2011-03-26 LAB — PROTIME-INR
INR: 2.4 — ABNORMAL HIGH
INR: 1.4
INR: 1.9 — ABNORMAL HIGH
Prothrombin Time: 27 — ABNORMAL HIGH
Prothrombin Time: 17.2 — ABNORMAL HIGH
Prothrombin Time: 44 — ABNORMAL HIGH

## 2011-03-26 LAB — OCCULT BLOOD X 1 CARD TO LAB, STOOL: Fecal Occult Bld: POSITIVE

## 2011-03-26 LAB — COMPREHENSIVE METABOLIC PANEL
ALT: 22
ALT: 26
AST: 18
AST: 22
AST: 18
Albumin: 2.8 — ABNORMAL LOW
Albumin: 2.8 — ABNORMAL LOW
Albumin: 3.1 — ABNORMAL LOW
Alkaline Phosphatase: 46
Alkaline Phosphatase: 30 — ABNORMAL LOW
Alkaline Phosphatase: 30 — ABNORMAL LOW
Alkaline Phosphatase: 33 — ABNORMAL LOW
BUN: 23
BUN: 5 — ABNORMAL LOW
BUN: 6
CO2: 27
CO2: 28
CO2: 17 — ABNORMAL LOW
CO2: 23
CO2: 23
Calcium: 9.2
Chloride: 105
Chloride: 105
Chloride: 107
Chloride: 110
Creatinine, Ser: 0.79
Creatinine, Ser: 0.79
Creatinine, Ser: 1.4
GFR calc Af Amer: >60
GFR calc Af Amer: >60
GFR calc non Af Amer: >60
GFR calc non Af Amer: 49 — ABNORMAL LOW
GFR calc non Af Amer: >60
GFR calc non Af Amer: >60
Glucose, Bld: 305 — ABNORMAL HIGH
Glucose, Bld: 164 — ABNORMAL HIGH
Glucose, Bld: 431 — ABNORMAL HIGH
Potassium: 4.2
Potassium: 3.5
Potassium: 3.7
Potassium: 4.4
Sodium: 138
Sodium: 138
Total Bilirubin: 0.7
Total Bilirubin: 0.9
Total Bilirubin: 1.1
Total Protein: 6.1

## 2011-03-26 LAB — LIPID PANEL
Cholesterol: 81
HDL: 19 — ABNORMAL LOW
LDL Cholesterol: 46
Total CHOL/HDL Ratio: 4.3

## 2011-03-26 LAB — GASTRIC OCCULT BLOOD (1-CARD TO LAB)
Occult Blood, Gastric: NEGATIVE
pH, Gastric: 5

## 2011-03-26 LAB — I-STAT 8, (EC8 V) (CONVERTED LAB)
Bicarbonate: 25.8 — ABNORMAL HIGH
Glucose, Bld: 307 — ABNORMAL HIGH
Potassium: 4.4
TCO2: 28
pCO2, Ven: 59.5 — ABNORMAL HIGH
pH, Ven: 7.245 — ABNORMAL LOW

## 2011-03-26 LAB — CK TOTAL AND CKMB (NOT AT ARMC)
CK, MB: 1.5
CK, MB: 1.7
Relative Index: INVALID
Total CK: 99

## 2011-03-26 LAB — ABO/RH: ABO/RH(D): AB POS

## 2011-03-26 LAB — CARDIAC PANEL(CRET KIN+CKTOT+MB+TROPI)
Relative Index: INVALID
Relative Index: INVALID
Total CK: 80

## 2011-03-26 LAB — DIFFERENTIAL
Basophils Absolute: 0
Basophils Relative: 1
Basophils Relative: 0
Eosinophils Absolute: 0.2
Eosinophils Relative: 3
Lymphocytes Relative: 11 — ABNORMAL LOW
Monocytes Absolute: 0.4
Neutro Abs: 8.5 — ABNORMAL HIGH
Neutrophils Relative %: 49

## 2011-03-26 LAB — POCT CARDIAC MARKERS
CKMB, poc: <1 — ABNORMAL LOW
Myoglobin, poc: 69
Operator id: 234501

## 2011-03-26 LAB — HEMOGLOBIN A1C
Hgb A1c MFr Bld: 8.1 — ABNORMAL HIGH
Mean Plasma Glucose: 275

## 2011-03-26 LAB — APTT: aPTT: 29

## 2011-03-26 LAB — CBC
HCT: 19.9 — ABNORMAL LOW
HCT: 29.1 — ABNORMAL LOW
Hemoglobin: 13.9
Hemoglobin: 10 — ABNORMAL LOW
Hemoglobin: 7.2 — CL
MCV: 89.5
MCV: 91.3
Platelets: 176
RBC: 4.41
RBC: 2.18 — ABNORMAL LOW
WBC: 5.2
WBC: 5.3
WBC: 9.7

## 2011-03-26 LAB — HEMOGLOBIN AND HEMATOCRIT, BLOOD
HCT: 26.3 — ABNORMAL LOW
HCT: 26.5 — ABNORMAL LOW
HCT: 27.7 — ABNORMAL LOW
HCT: 28.6 — ABNORMAL LOW
HCT: 30.5 — ABNORMAL LOW
Hemoglobin: 10.2 — ABNORMAL LOW
Hemoglobin: 10.3 — ABNORMAL LOW
Hemoglobin: 8.3 — ABNORMAL LOW
Hemoglobin: 9.4 — ABNORMAL LOW

## 2011-03-26 LAB — MAGNESIUM: Magnesium: 1.8

## 2011-03-26 LAB — B-NATRIURETIC PEPTIDE (CONVERTED LAB)
Pro B Natriuretic peptide (BNP): 103 — ABNORMAL HIGH
Pro B Natriuretic peptide (BNP): 43

## 2011-03-26 LAB — PREPARE FRESH FROZEN PLASMA

## 2011-03-26 LAB — TROPONIN I: Troponin I: 0.02

## 2011-03-26 LAB — TSH: TSH: 0.532

## 2011-03-26 LAB — PREPARE RBC (CROSSMATCH)

## 2011-03-26 LAB — POCT I-STAT CREATININE: Creatinine, Ser: 0.9

## 2011-03-27 ENCOUNTER — Ambulatory Visit (INDEPENDENT_AMBULATORY_CARE_PROVIDER_SITE_OTHER): Payer: Medicare Other | Admitting: *Deleted

## 2011-03-27 DIAGNOSIS — Z954 Presence of other heart-valve replacement: Secondary | ICD-10-CM

## 2011-03-27 DIAGNOSIS — I4891 Unspecified atrial fibrillation: Secondary | ICD-10-CM

## 2011-03-27 DIAGNOSIS — I359 Nonrheumatic aortic valve disorder, unspecified: Secondary | ICD-10-CM

## 2011-03-27 DIAGNOSIS — I6350 Cerebral infarction due to unspecified occlusion or stenosis of unspecified cerebral artery: Secondary | ICD-10-CM

## 2011-03-27 LAB — CBC
Hemoglobin: 10.2 — ABNORMAL LOW
MCV: 89.4
RBC: 3.2 — ABNORMAL LOW
WBC: 5.3

## 2011-03-27 LAB — BASIC METABOLIC PANEL
Chloride: 106
Creatinine, Ser: 0.96
GFR calc Af Amer: >60
Sodium: 136

## 2011-03-27 LAB — HEMOGLOBIN AND HEMATOCRIT, BLOOD
HCT: 28.9 — ABNORMAL LOW
Hemoglobin: 9.8 — ABNORMAL LOW

## 2011-04-24 ENCOUNTER — Ambulatory Visit (INDEPENDENT_AMBULATORY_CARE_PROVIDER_SITE_OTHER): Payer: Medicare Other | Admitting: *Deleted

## 2011-04-24 DIAGNOSIS — I4891 Unspecified atrial fibrillation: Secondary | ICD-10-CM

## 2011-04-24 DIAGNOSIS — Z954 Presence of other heart-valve replacement: Secondary | ICD-10-CM

## 2011-04-24 DIAGNOSIS — I6350 Cerebral infarction due to unspecified occlusion or stenosis of unspecified cerebral artery: Secondary | ICD-10-CM

## 2011-04-24 DIAGNOSIS — I359 Nonrheumatic aortic valve disorder, unspecified: Secondary | ICD-10-CM

## 2011-04-24 LAB — POCT INR: INR: 2.9

## 2011-06-04 ENCOUNTER — Ambulatory Visit (INDEPENDENT_AMBULATORY_CARE_PROVIDER_SITE_OTHER): Payer: Medicare Other | Admitting: *Deleted

## 2011-06-04 DIAGNOSIS — I359 Nonrheumatic aortic valve disorder, unspecified: Secondary | ICD-10-CM

## 2011-06-04 DIAGNOSIS — I4891 Unspecified atrial fibrillation: Secondary | ICD-10-CM

## 2011-06-04 DIAGNOSIS — Z954 Presence of other heart-valve replacement: Secondary | ICD-10-CM

## 2011-06-04 DIAGNOSIS — I6350 Cerebral infarction due to unspecified occlusion or stenosis of unspecified cerebral artery: Secondary | ICD-10-CM

## 2011-06-04 LAB — POCT INR: INR: 1.9

## 2011-07-17 ENCOUNTER — Ambulatory Visit (INDEPENDENT_AMBULATORY_CARE_PROVIDER_SITE_OTHER): Payer: Medicare Other | Admitting: *Deleted

## 2011-07-17 DIAGNOSIS — I359 Nonrheumatic aortic valve disorder, unspecified: Secondary | ICD-10-CM

## 2011-07-17 DIAGNOSIS — I4891 Unspecified atrial fibrillation: Secondary | ICD-10-CM

## 2011-07-17 DIAGNOSIS — Z954 Presence of other heart-valve replacement: Secondary | ICD-10-CM

## 2011-07-17 DIAGNOSIS — I6350 Cerebral infarction due to unspecified occlusion or stenosis of unspecified cerebral artery: Secondary | ICD-10-CM

## 2011-08-28 ENCOUNTER — Ambulatory Visit (INDEPENDENT_AMBULATORY_CARE_PROVIDER_SITE_OTHER): Payer: Medicare Other | Admitting: *Deleted

## 2011-08-28 DIAGNOSIS — I359 Nonrheumatic aortic valve disorder, unspecified: Secondary | ICD-10-CM

## 2011-08-28 DIAGNOSIS — I4891 Unspecified atrial fibrillation: Secondary | ICD-10-CM

## 2011-08-28 DIAGNOSIS — Z954 Presence of other heart-valve replacement: Secondary | ICD-10-CM

## 2011-08-28 DIAGNOSIS — I6350 Cerebral infarction due to unspecified occlusion or stenosis of unspecified cerebral artery: Secondary | ICD-10-CM

## 2011-08-28 LAB — POCT INR: INR: 1.3

## 2011-09-11 ENCOUNTER — Ambulatory Visit (INDEPENDENT_AMBULATORY_CARE_PROVIDER_SITE_OTHER): Payer: Medicare Other | Admitting: *Deleted

## 2011-09-11 DIAGNOSIS — Z954 Presence of other heart-valve replacement: Secondary | ICD-10-CM

## 2011-09-11 DIAGNOSIS — I6350 Cerebral infarction due to unspecified occlusion or stenosis of unspecified cerebral artery: Secondary | ICD-10-CM

## 2011-09-11 DIAGNOSIS — I359 Nonrheumatic aortic valve disorder, unspecified: Secondary | ICD-10-CM

## 2011-09-11 DIAGNOSIS — I4891 Unspecified atrial fibrillation: Secondary | ICD-10-CM

## 2011-09-11 LAB — POCT INR: INR: 2

## 2011-09-13 ENCOUNTER — Other Ambulatory Visit: Payer: Self-pay | Admitting: Cardiology

## 2011-10-05 ENCOUNTER — Other Ambulatory Visit: Payer: Self-pay | Admitting: Cardiology

## 2011-10-08 ENCOUNTER — Other Ambulatory Visit: Payer: Self-pay

## 2011-10-08 ENCOUNTER — Emergency Department (HOSPITAL_COMMUNITY): Payer: Medicare Other

## 2011-10-08 ENCOUNTER — Encounter (HOSPITAL_COMMUNITY): Payer: Self-pay | Admitting: Emergency Medicine

## 2011-10-08 ENCOUNTER — Other Ambulatory Visit (HOSPITAL_COMMUNITY): Payer: Medicare Other

## 2011-10-08 ENCOUNTER — Inpatient Hospital Stay (HOSPITAL_COMMUNITY)
Admission: EM | Admit: 2011-10-08 | Discharge: 2011-10-12 | DRG: 065 | Disposition: A | Discharge status: Home Health | Payer: Medicare Other | Attending: Internal Medicine | Admitting: Internal Medicine

## 2011-10-08 DIAGNOSIS — H9190 Unspecified hearing loss, unspecified ear: Secondary | ICD-10-CM | POA: Diagnosis present

## 2011-10-08 DIAGNOSIS — I639 Cerebral infarction, unspecified: Secondary | ICD-10-CM | POA: Diagnosis present

## 2011-10-08 DIAGNOSIS — Z8673 Personal history of transient ischemic attack (TIA), and cerebral infarction without residual deficits: Secondary | ICD-10-CM

## 2011-10-08 DIAGNOSIS — Z23 Encounter for immunization: Secondary | ICD-10-CM

## 2011-10-08 DIAGNOSIS — I6350 Cerebral infarction due to unspecified occlusion or stenosis of unspecified cerebral artery: Principal | ICD-10-CM | POA: Diagnosis present

## 2011-10-08 DIAGNOSIS — Z79899 Other long term (current) drug therapy: Secondary | ICD-10-CM

## 2011-10-08 DIAGNOSIS — I4891 Unspecified atrial fibrillation: Secondary | ICD-10-CM | POA: Insufficient documentation

## 2011-10-08 DIAGNOSIS — B029 Zoster without complications: Secondary | ICD-10-CM | POA: Diagnosis present

## 2011-10-08 DIAGNOSIS — I2510 Atherosclerotic heart disease of native coronary artery without angina pectoris: Secondary | ICD-10-CM | POA: Insufficient documentation

## 2011-10-08 DIAGNOSIS — E119 Type 2 diabetes mellitus without complications: Secondary | ICD-10-CM | POA: Diagnosis present

## 2011-10-08 DIAGNOSIS — I10 Essential (primary) hypertension: Secondary | ICD-10-CM | POA: Insufficient documentation

## 2011-10-08 DIAGNOSIS — R4182 Altered mental status, unspecified: Secondary | ICD-10-CM | POA: Diagnosis present

## 2011-10-08 DIAGNOSIS — F0390 Unspecified dementia without behavioral disturbance: Secondary | ICD-10-CM | POA: Diagnosis present

## 2011-10-08 DIAGNOSIS — Z7901 Long term (current) use of anticoagulants: Secondary | ICD-10-CM

## 2011-10-08 DIAGNOSIS — B0229 Other postherpetic nervous system involvement: Secondary | ICD-10-CM | POA: Diagnosis present

## 2011-10-08 DIAGNOSIS — Z954 Presence of other heart-valve replacement: Secondary | ICD-10-CM

## 2011-10-08 DIAGNOSIS — Z7982 Long term (current) use of aspirin: Secondary | ICD-10-CM

## 2011-10-08 HISTORY — DX: Essential (primary) hypertension: I10

## 2011-10-08 LAB — URINALYSIS, ROUTINE W REFLEX MICROSCOPIC
Ketones, ur: NEGATIVE mg/dL
Leukocytes, UA: NEGATIVE
Nitrite: NEGATIVE
pH: 5.5 (ref 5.0–8.0)

## 2011-10-08 LAB — DIFFERENTIAL
Basophils Absolute: 0 10*3/uL (ref 0.0–0.1)
Eosinophils Relative: 2 % (ref 0–5)
Lymphocytes Relative: 39 % (ref 12–46)
Lymphs Abs: 1.6 10*3/uL (ref 0.7–4.0)
Neutro Abs: 1.7 10*3/uL (ref 1.7–7.7)
Neutrophils Relative %: 43 % (ref 43–77)

## 2011-10-08 LAB — CARDIAC PANEL(CRET KIN+CKTOT+MB+TROPI)
Relative Index: INVALID (ref 0.0–2.5)
Total CK: 55 U/L (ref 7–232)
Troponin I: <0.3 ng/mL (ref <0.30)

## 2011-10-08 LAB — HEMOGLOBIN A1C: Mean Plasma Glucose: 131 mg/dL — ABNORMAL HIGH (ref <117)

## 2011-10-08 LAB — COMPREHENSIVE METABOLIC PANEL
AST: 24 U/L (ref 0–37)
Albumin: 3.5 g/dL (ref 3.5–5.2)
Alkaline Phosphatase: 40 U/L (ref 39–117)
BUN: 22 mg/dL (ref 6–23)
Creatinine, Ser: 0.85 mg/dL (ref 0.50–1.35)
Potassium: 4.2 mEq/L (ref 3.5–5.1)
Total Protein: 6.9 g/dL (ref 6.0–8.3)

## 2011-10-08 LAB — CBC
MCV: 88.4 fL (ref 78.0–100.0)
Platelets: 122 10*3/uL — ABNORMAL LOW (ref 150–400)
RBC: 4.58 MIL/uL (ref 4.22–5.81)
RDW: 13.2 % (ref 11.5–15.5)
WBC: 4.1 10*3/uL (ref 4.0–10.5)

## 2011-10-08 LAB — GLUCOSE, CAPILLARY: Glucose-Capillary: 74 mg/dL (ref 70–99)

## 2011-10-08 LAB — TROPONIN I: Troponin I: <0.3 ng/mL (ref <0.30)

## 2011-10-08 LAB — PROTIME-INR
INR: 1.57 — ABNORMAL HIGH (ref 0.00–1.49)
Prothrombin Time: 19.1 seconds — ABNORMAL HIGH (ref 11.6–15.2)

## 2011-10-08 MED ORDER — SODIUM CHLORIDE 0.9 % IV SOLN
INTRAVENOUS | Status: AC
  Administered 2011-10-08: 21:00:00 via INTRAVENOUS

## 2011-10-08 MED ORDER — SODIUM CHLORIDE 0.9 % IV SOLN: INTRAVENOUS | Status: DC

## 2011-10-08 MED ORDER — ENALAPRIL MALEATE 20 MG PO TABS
20.0000 mg | ORAL_TABLET | Freq: Every day | ORAL | Status: DC
  Administered 2011-10-08 – 2011-10-12 (×5): 20 mg via ORAL
  Filled 2011-10-08 (×5): qty 1

## 2011-10-08 MED ORDER — NIACIN ER 500 MG PO CPCR
2000.0000 mg | ORAL_CAPSULE | Freq: Every day | ORAL | Status: DC
  Administered 2011-10-08 – 2011-10-12 (×4): 2000 mg via ORAL
  Filled 2011-10-08 (×5): qty 4

## 2011-10-08 MED ORDER — POLYETHYLENE GLYCOL 3350 17 G PO PACK
17.0000 g | PACK | Freq: Every day | ORAL | Status: DC | PRN
  Filled 2011-10-08: qty 1

## 2011-10-08 MED ORDER — ASPIRIN EC 81 MG PO TBEC
81.0000 mg | DELAYED_RELEASE_TABLET | Freq: Every day | ORAL | Status: DC
  Administered 2011-10-08 – 2011-10-12 (×5): 81 mg via ORAL
  Filled 2011-10-08 (×5): qty 1

## 2011-10-08 MED ORDER — ONDANSETRON HCL 4 MG PO TABS: 4.0000 mg | ORAL_TABLET | Freq: Four times a day (QID) | ORAL | Status: DC | PRN

## 2011-10-08 MED ORDER — ONDANSETRON HCL 4 MG/2ML IJ SOLN: 4.0000 mg | Freq: Four times a day (QID) | INTRAMUSCULAR | Status: DC | PRN

## 2011-10-08 MED ORDER — SIMVASTATIN 20 MG PO TABS
20.0000 mg | ORAL_TABLET | Freq: Every day | ORAL | Status: DC
  Administered 2011-10-09 – 2011-10-11 (×3): 20 mg via ORAL
  Filled 2011-10-08 (×4): qty 1

## 2011-10-08 MED ORDER — NIACIN ER (ANTIHYPERLIPIDEMIC) 1000 MG PO TBCR: 2000.0000 mg | EXTENDED_RELEASE_TABLET | Freq: Every day | ORAL | Status: DC

## 2011-10-08 MED ORDER — SODIUM CHLORIDE 0.9 % IJ SOLN
3.0000 mL | Freq: Two times a day (BID) | INTRAMUSCULAR | Status: DC
  Administered 2011-10-08 – 2011-10-12 (×7): 3 mL via INTRAVENOUS

## 2011-10-08 MED ORDER — ACETAMINOPHEN 325 MG PO TABS
650.0000 mg | ORAL_TABLET | ORAL | Status: DC | PRN
  Administered 2011-10-10 – 2011-10-11 (×3): 650 mg via ORAL
  Filled 2011-10-08 (×3): qty 2

## 2011-10-08 MED ORDER — WARFARIN - PHARMACIST DOSING INPATIENT: Freq: Every day | Status: DC

## 2011-10-08 MED ORDER — METOPROLOL TARTRATE 1 MG/ML IV SOLN
2.5000 mg | Freq: Once | INTRAVENOUS | Status: AC
  Administered 2011-10-08: 2.5 mg via INTRAVENOUS
  Filled 2011-10-08: qty 5

## 2011-10-08 MED ORDER — INSULIN ASPART 100 UNIT/ML ~~LOC~~ SOLN
0.0000 [IU] | Freq: Three times a day (TID) | SUBCUTANEOUS | Status: DC
  Administered 2011-10-09 (×2): 1 [IU] via SUBCUTANEOUS
  Administered 2011-10-10: 2 [IU] via SUBCUTANEOUS
  Administered 2011-10-10 (×2): 3 [IU] via SUBCUTANEOUS
  Administered 2011-10-11 (×2): 2 [IU] via SUBCUTANEOUS
  Administered 2011-10-11: 3 [IU] via SUBCUTANEOUS
  Administered 2011-10-12 (×2): 2 [IU] via SUBCUTANEOUS

## 2011-10-08 MED ORDER — ACETAMINOPHEN 650 MG RE SUPP: 650.0000 mg | RECTAL | Status: DC | PRN

## 2011-10-08 MED ORDER — SENNOSIDES-DOCUSATE SODIUM 8.6-50 MG PO TABS: 1.0000 | ORAL_TABLET | Freq: Every evening | ORAL | Status: DC | PRN

## 2011-10-08 MED ORDER — FUROSEMIDE 20 MG PO TABS
20.0000 mg | ORAL_TABLET | Freq: Every day | ORAL | Status: DC
  Administered 2011-10-08 – 2011-10-12 (×5): 20 mg via ORAL
  Filled 2011-10-08 (×5): qty 1

## 2011-10-08 MED ORDER — WARFARIN SODIUM 6 MG PO TABS
6.0000 mg | ORAL_TABLET | Freq: Once | ORAL | Status: AC
  Administered 2011-10-08: 6 mg via ORAL
  Filled 2011-10-08: qty 1

## 2011-10-08 MED ORDER — SODIUM CHLORIDE 0.9 % IV BOLUS (SEPSIS)
1000.0000 mL | Freq: Once | INTRAVENOUS | Status: AC
  Administered 2011-10-08: 1000 mL via INTRAVENOUS

## 2011-10-08 NOTE — Progress Notes (Signed)
ANTICOAGULATION CONSULT NOTE - Initial Consult  Pharmacy Consult for Coumadin Indication: atrial fibrillation  No Known Allergies  Patient Measurements:     Vital Signs: Temp: 98.5 F (36.9 C) (04/08 1525) Temp src: Oral (04/08 1525) BP: 145/82 mmHg (04/08 1645) Pulse Rate: 50  (04/08 1645)  Labs:  Basename 10/08/11 1309  HGB 14.6  HCT 40.5  PLT 122*  APTT --  LABPROT 19.1*  INR 1.57*  HEPARINUNFRC --  CREATININE 0.85  CKTOTAL --  CKMB --  TROPONINI <0.30   The CrCl is unknown because both a height and weight (above a minimum accepted value) are required for this calculation.  Medical History: Past Medical History  Diagnosis Date  . Hypertension   . Diabetes mellitus     Medications:  Coumadin 3mg  on Tues/Sat and 4.5mg  other days.  Last dose 10/07/11.  Assessment: 78 yo M admitted with AMS/confusion x 1 week.  Head CT neg for new events.  Pt on Coumadin PTA for hx AFib.  INR on admission subtherapeutic.  Pt has not taken Coumadin dose today.  Goal of Therapy:  INR 2-3   Plan:  Coumadin 6 mg PO x 1 tonight. Daily INR.  [employer], Pharm.D., BCPS Clinical Pharmacist Pager [phone] 10/08/2011 6:08 PM

## 2011-10-08 NOTE — ED Provider Notes (Signed)
History     CSN: [id number]  Arrival date & time 10/08/11  1211   First MD Initiated Contact with Patient 10/08/11 1231      Chief Complaint  Patient presents with  . Altered Mental Status    (Consider location/radiation/quality/duration/timing/severity/associated sxs/prior treatment) HPI  78 year old male with history of hypertension history of diabetes presents accompanied by family with a chief complaints of altered mental status. History was obtained mostly through family members. Patient is from [place] and does not speak [place].  Per family member, patient has no prior history of dementia. However, for the past week he has been acting bizarre, different from his normal baseline. States he has been visual and auditory hallucination, and sometimes being very aggressive towards other. States the symptom has been waxing and waning, one moment he is acting normal, next moment he is having bizarre behavior. Family member  also recalled the patient has not been eating hasn't been sleeping as he usually does. Difficult to fully obtain a good history from patient but according to family members, patient is currently denies headache, neck pain, chest pain, shortness of breath, abdominal pain, nausea, vomiting, diarrhea, dysuria, numbness or weakness.  States patient was seen at urgent care for the symptoms a few days ago. At which time his blood sugar was 190. States patient was discharge without any treatment.   Past Medical History  Diagnosis Date  . Hypertension   . Diabetes mellitus     Past Surgical History  Procedure Date  . Cardiac surgery     No family history on file.  History  Substance Use Topics  . Smoking status: Not on file  . Smokeless tobacco: Not on file  . Alcohol Use:       Review of Systems  Unable to perform ROS: Mental status change    Allergies  Review of patient's allergies indicates no known allergies.  Home Medications   Current Outpatient Rx    Name Route Sig Dispense Refill  . FUROSEMIDE 40 MG PO TABS  TAKE 1/2 TABLET BY MOUTH EVERY DAY 15 tablet 10  . WARFARIN SODIUM 3 MG PO TABS  TAKE AS DIRECTED BY ANTICOAGULATION CLINIC 50 tablet 3  . WARFARIN SODIUM 3 MG PO TABS Oral Take 1 tablet (3 mg total) by mouth as directed. 50 tablet 3    NEEDS REFILLS    BP 174/90  Pulse 88  Temp(Src) 97.5 F (36.4 C) (Oral)  Resp 20  SpO2 95%  Physical Exam  Nursing note and vitals reviewed. Constitutional: He appears well-developed and well-nourished.  HENT:  Head: Normocephalic and atraumatic.  Mouth/Throat: Oropharynx is clear and moist. No oropharyngeal exudate.  Eyes: Conjunctivae and EOM are normal. Pupils are equal, round, and reactive to light. Right eye exhibits no discharge. Left eye exhibits no discharge.  Neck: Normal range of motion. Neck supple.  Cardiovascular: Intact distal pulses.  An irregularly irregular rhythm present. Exam reveals no gallop and no friction rub.   No murmur heard. Pulmonary/Chest: Effort normal. No respiratory distress. He exhibits no tenderness.  Abdominal: Soft. There is no tenderness.  Musculoskeletal: Normal range of motion.  Lymphadenopathy:    He has no cervical adenopathy.  Neurological: He is alert. He has normal strength. No cranial nerve deficit. He exhibits normal muscle tone. He displays a negative Romberg sign. Coordination and gait normal. GCS eye subscore is 4. GCS verbal subscore is 5. GCS motor subscore is 6.  Skin: Skin is warm. No rash noted.  Psychiatric:  He has a normal mood and affect.    ED Course  Procedures (including critical care time)  Labs Reviewed - No data to display No results found.   No diagnosis found.   Date: 10/08/2011  Rate: 62  Rhythm: atrial fibrillation with slow ventricular response  QRS Axis: left  Intervals: normal  ST/T Wave abnormalities:nonspecific ST changes  Conduction Disutrbances:left bundle branch block  Narrative Interpretation:    Old EKG Reviewed: unchanged  Results for orders placed during the hospital encounter of 10/08/11  CBC      Component Value Range   WBC 4.1  4.0 - 10.5 (K/uL)   RBC 4.58  4.22 - 5.81 (MIL/uL)   Hemoglobin 14.6  13.0 - 17.0 (g/dL)   HCT 11.9  14.7 - 82.9 (%)   MCV 88.4  78.0 - 100.0 (fL)   MCH 31.9  26.0 - 34.0 (pg)   MCHC 36.0  30.0 - 36.0 (g/dL)   RDW 56.2  13.0 - 86.5 (%)   Platelets 122 (*) 150 - 400 (K/uL)  DIFFERENTIAL      Component Value Range   Neutrophils Relative 43  43 - 77 (%)   Neutro Abs 1.7  1.7 - 7.7 (K/uL)   Lymphocytes Relative 39  12 - 46 (%)   Lymphs Abs 1.6  0.7 - 4.0 (K/uL)   Monocytes Relative 16 (*) 3 - 12 (%)   Monocytes Absolute 0.7  0.1 - 1.0 (K/uL)   Eosinophils Relative 2  0 - 5 (%)   Eosinophils Absolute 0.1  0.0 - 0.7 (K/uL)   Basophils Relative 1  0 - 1 (%)   Basophils Absolute 0.0  0.0 - 0.1 (K/uL)  URINALYSIS, ROUTINE W REFLEX MICROSCOPIC      Component Value Range   Color, Urine YELLOW  YELLOW    APPearance CLEAR  CLEAR    Specific Gravity, Urine 1.021  1.005 - 1.030    pH 5.5  5.0 - 8.0    Glucose, UA NEGATIVE  NEGATIVE (mg/dL)   Hgb urine dipstick NEGATIVE  NEGATIVE    Bilirubin Urine NEGATIVE  NEGATIVE    Ketones, ur NEGATIVE  NEGATIVE (mg/dL)   Protein, ur NEGATIVE  NEGATIVE (mg/dL)   Urobilinogen, UA 1.0  0.0 - 1.0 (mg/dL)   Nitrite NEGATIVE  NEGATIVE    Leukocytes, UA NEGATIVE  NEGATIVE   TROPONIN I      Component Value Range   Troponin I <0.30  <0.30 (ng/mL)  COMPREHENSIVE METABOLIC PANEL      Component Value Range   Sodium 137  135 - 145 (mEq/L)   Potassium 4.2  3.5 - 5.1 (mEq/L)   Chloride 103  96 - 112 (mEq/L)   CO2 24  19 - 32 (mEq/L)   Glucose, Bld 179 (*) 70 - 99 (mg/dL)   BUN 22  6 - 23 (mg/dL)   Creatinine, Ser 7.84  0.50 - 1.35 (mg/dL)   Calcium 9.5  8.4 - 69.6 (mg/dL)   Total Protein 6.9  6.0 - 8.3 (g/dL)   Albumin 3.5  3.5 - 5.2 (g/dL)   AST 24  0 - 37 (U/L)   ALT 18  0 - 53 (U/L)   Alkaline Phosphatase 40   39 - 117 (U/L)   Total Bilirubin 0.6  0.3 - 1.2 (mg/dL)   GFR calc non Af Amer 79 (*) >90 (mL/min)   GFR calc Af Amer >90  >90 (mL/min)  PROTIME-INR      Component Value Range  Prothrombin Time 19.1 (*) 11.6 - 15.2 (seconds)   INR 1.57 (*) 0.00 - 1.49   GLUCOSE, CAPILLARY      Component Value Range   Glucose-Capillary 156 (*) 70 - 99 (mg/dL)   Comment 1 Documented in Chart     Comment 2 Notify RN     Dg Chest 2 View  10/08/2011  *RADIOLOGY REPORT*  Clinical Data: Altered mental status.  Weakness.  CHEST - 2 VIEW  Comparison: 09/09/2007  Findings: Prior median sternotomy.  Mild cardiomegaly.  Tortuous thoracic aorta.  Aortic valve replacement.  No pleural effusion or pneumothorax.  Diffuse peribronchial thickening.  Calcified granulomas bilaterally.  Mild volume loss at the right lung base.  IMPRESSION: Cardiomegaly and chronic interstitial thickening. No acute superimposed process.  Original Report Authenticated By: Consuello Bossier, M.D.   Ct Head Wo Contrast  10/08/2011  *RADIOLOGY REPORT*  Clinical Data: Confusion, psychosis  CT HEAD WITHOUT CONTRAST  Technique:  Contiguous axial images were obtained from the base of the skull through the vertex without contrast.  Comparison: None.  Findings: Brain atrophy evident with microvascular ischemic changes throughout the cerebral white matter bilaterally.  Remote infarcts in the right frontal white matter and the left cerebral hemisphere. No acute intracranial hemorrhage, focal edema, mass lesion, definite acute infarction midline shift, herniation, hydrocephalus, or extra-axial fluid collection.  Gray-white matter differentiation maintained.  Cisterns patent.  Cerebellar atrophy as well. Symmetric orbits.  Mastoids and sinuses clear.  IMPRESSION: Atrophy, microvascular ischemic changes and remote infarcts.  No acute process by noncontrast CT.  Original Report Authenticated By: Judie Petit. Ruel Favors, M.D.      MDM  Dementia vs. Delirium , vs stroke.  Work  up initiated.  Pt appears to be in NAD at this time with stable VS.  2:26 PM Patient is subtherapeutic with an INR level of 1.57. Otherwise, his labs were unremarkable. No evidence of dehydration. Electrolytes are within normal limits. His normal hemoglobin. His CBG is mildly elevated at 156. EKG shows evidence of atrial fibrillation and left bundle branch block. Troponin is negative. Chest x-ray shows no acute process. Head CT shows atrophy with microvascular ischemic changes and remote infarct but no acute process.     3:35 PM I have consulted with [facility], Dr. Magdalen Spatz, who agrees to admit to for observation and perhaps placement.  To tele bed, Team 6.  Pt currenty in NAD.   Fayrene Helper, PA-C 10/08/11 1536  Fayrene Helper, PA-C 10/08/11 1536

## 2011-10-08 NOTE — Progress Notes (Signed)
Called by RN to inform me that patient is having mild headache. Chart reviewed and noted that his MRI  Came back with small punctate acute non hemorrhagic infarct involving the posterior  left occipital lobe. and possible second punctate acute non hemorrhagic infarct over the high right frontal lobe.  Have called the family in attempt to notify them. Called daughter at [phone] with no answer.  His simptoms have started 6-7days ago he is out of the time-frame from receiving TPA and also has been taking chronic Coumadin. We'll make sure that stroke protocol orders are in place. Consider neurology consult in the morning. He is already on coumadin and Aspirin 81mg  with slightly low plt count at 122 and 1 at this point will not change therapy. If any significant neurological changes occurs would repeat imaging.   Chart including problem list, medications, labs and vitals were reviewed  Harold Jones 8:30 PM

## 2011-10-08 NOTE — ED Notes (Signed)
Shana Chute (soninlaw) [phone] Ammie Ferrier (granddaughter) [phone]

## 2011-10-08 NOTE — ED Notes (Signed)
Pt in ct 

## 2011-10-08 NOTE — ED Notes (Signed)
Patient noted to have bradycardia.  ermd aware and primary RN.  No s/sx of distress.  Patient is resting and remains on cont cardiac monitoring.

## 2011-10-08 NOTE — ED Notes (Signed)
Per son ( pt speaks no [place]) pt has had off and on hallucinations for a while that come and go this time he has cont to have them . He has been confused and yelling at wife speaking to dead relatives. Pt was seen at an [facility] and his sugar was 193 was sent home son is getting background from wife who also speaks no [place]

## 2011-10-08 NOTE — ED Notes (Addendum)
Family reports pt has been acting altered for a week but getting worse. His daughter told the family member here that the pt has been cursing at them, seeing/talking to dead people, being very aggressive; also was saying yesterday that he was going to die. Today was hallucinating a lot and at this moment seems normal but when they brought him he wasn't acting normal. Was seen recently by PCP and was told his CBG was high. They are also checking his blood viscosity every month because his blood goes from thick to thin. Doesn't want to eat, not sleeping.

## 2011-10-08 NOTE — Progress Notes (Signed)
10/08/11 NSG 1716 78 year old male, admitted to 5 from ED with altered mental status.  Pt. Lives home with wife, per pt. Daughter,  Philmore Pali.  Used interpreter [phone] to speak to pt. In [place].  A & O to self and place, re-oriented to time.  Pt. Was ambulating independent PTA, now needs assistant.  Reviewed the safety plan with pt. And daughter, Philmore Pali and instructed pt. not to get up by himself.  Placed on bed alarm and telemetry.  Plan to return home when medically stable.  IV  to right hand dated for today, skin intact except for red bruise to right upper back.  Pt.  fall risk score of 19.  Plan of care to maintain safety.  Will continue to monitor and assist with d/c planning as pt. Progresses.  Forbes Cellar, RN

## 2011-10-08 NOTE — ED Notes (Signed)
[id number] Ready

## 2011-10-08 NOTE — H&P (Signed)
PCP:  Julieanne Manson, MD, MD   DOA:  10/08/2011 12:13 PM  Chief Complaint:  Altered mental status  HPI: History obtained from wife and son in law. 78 years old [place] man with history of diabetes, hypertension, atrial fibrillation and other comorbidities as outlined below, he was brought into the hospital because of altered mental status for the last 6-7 days. As per family patient does not have history of dementia however for the last week he had bizarre behavior, visual and auditory hallucinations and aggressive behavior sometimes. There was no reported fever, chills, nausea or vomiting, no change in his bowel habits, no dysuria or any other complaints. In the ED CT head showed no acute findings, labs showed no significant abnormalities. We are consulted to admit for further management to  Allergies: No Known Allergies  Prior to Admission medications   Medication Sig Start Date End Date Taking? Authorizing Provider  aspirin EC 81 MG tablet Take 81 mg by mouth daily.   Yes Historical Provider, MD  enalapril (VASOTEC) 20 MG tablet Take 20 mg by mouth daily.   Yes Historical Provider, MD  furosemide (LASIX) 40 MG tablet Take 20 mg by mouth daily.   Yes Historical Provider, MD  metFORMIN (GLUCOPHAGE) 500 MG tablet Take 500 mg by mouth 2 (two) times daily with a meal.   Yes Historical Provider, MD  metoprolol succinate (TOPROL-XL) 100 MG 24 hr tablet Take 100 mg by mouth every evening. Take with or immediately following a meal.   Yes Historical Provider, MD  niacin (NIASPAN) 1000 MG CR tablet Take 2,000 mg by mouth at bedtime.   Yes Historical Provider, MD  pravastatin (PRAVACHOL) 40 MG tablet Take 40 mg by mouth daily.   Yes Historical Provider, MD  warfarin (COUMADIN) 3 MG tablet Take 3-4.5 mg by mouth daily. Take 1 tablet on tues and sat, take 1.5 tablet all other days 10/05/11  Yes Lewayne Bunting, MD    Past Medical History  Diagnosis Date  . Hypertension   . Diabetes mellitus      Past Surgical History  Procedure Date  . Cardiac surgery     Social History:  lives with wife,denies any smoking, drinks wine occasionally, no history of illicit drug use.   Review of Systems: Unable to obtain   Physical Exam:  Filed Vitals:   10/08/11 1215 10/08/11 1430 10/08/11 1445 10/08/11 1525  BP: 174/90 161/78 149/74 144/52  Pulse: 88 38 55 56  Temp: 97.5 F (36.4 C)   98.5 F (36.9 C)  TempSrc: Oral   Oral  Resp: 20 20 15 15   SpO2: 95% 97% 98% 97%    Constitutional: Vital signs reviewed.  Patient is in no acute distress . Alert , confused. Eyes: PERRL, EOMI, conjunctivae normal, No scleral icterus.  Neck: Supple, Trachea midline normal ROM, No JVD, mass, thyromegaly, or carotid bruit present.  Cardiovascular: Irregular irregular rhythm, systolic murmur  Pulmonary/Chest: CTAB, no wheezes, rales, or rhonchi Abdominal: Soft. Non-tender, non-distended, bowel sounds are normal, no masses, organomegaly, or guarding present.  Ext: no edema and no cyanosis, pulses palpable bilaterally (DP and PT) Hematology: no cervical, inginal, or axillary adenopathy.  Neurological: Alert, moving all his extremities, no focal deficits appreciated.  Labs on Admission:  Results for orders placed during the hospital encounter of 10/08/11 (from the past 48 hour(s))  CBC     Status: Abnormal   Collection Time   10/08/11  1:09 PM      Component Value Range Comment  WBC 4.1  4.0 - 10.5 (K/uL)    RBC 4.58  4.22 - 5.81 (MIL/uL)    Hemoglobin 14.6  13.0 - 17.0 (g/dL)    HCT 16.1  09.6 - 04.5 (%)    MCV 88.4  78.0 - 100.0 (fL)    MCH 31.9  26.0 - 34.0 (pg)    MCHC 36.0  30.0 - 36.0 (g/dL)    RDW 40.9  81.1 - 91.4 (%)    Platelets 122 (*) 150 - 400 (K/uL)   DIFFERENTIAL     Status: Abnormal   Collection Time   10/08/11  1:09 PM      Component Value Range Comment   Neutrophils Relative 43  43 - 77 (%)    Neutro Abs 1.7  1.7 - 7.7 (K/uL)    Lymphocytes Relative 39  12 - 46 (%)     Lymphs Abs 1.6  0.7 - 4.0 (K/uL)    Monocytes Relative 16 (*) 3 - 12 (%)    Monocytes Absolute 0.7  0.1 - 1.0 (K/uL)    Eosinophils Relative 2  0 - 5 (%)    Eosinophils Absolute 0.1  0.0 - 0.7 (K/uL)    Basophils Relative 1  0 - 1 (%)    Basophils Absolute 0.0  0.0 - 0.1 (K/uL)   TROPONIN I     Status: Normal   Collection Time   10/08/11  1:09 PM      Component Value Range Comment   Troponin I <0.30  <0.30 (ng/mL)   COMPREHENSIVE METABOLIC PANEL     Status: Abnormal   Collection Time   10/08/11  1:09 PM      Component Value Range Comment   Sodium 137  135 - 145 (mEq/L)    Potassium 4.2  3.5 - 5.1 (mEq/L)    Chloride 103  96 - 112 (mEq/L)    CO2 24  19 - 32 (mEq/L)    Glucose, Bld 179 (*) 70 - 99 (mg/dL)    BUN 22  6 - 23 (mg/dL)    Creatinine, Ser 7.82  0.50 - 1.35 (mg/dL)    Calcium 9.5  8.4 - 10.5 (mg/dL)    Total Protein 6.9  6.0 - 8.3 (g/dL)    Albumin 3.5  3.5 - 5.2 (g/dL)    AST 24  0 - 37 (U/L) HEMOLYSIS AT THIS LEVEL MAY AFFECT RESULT   ALT 18  0 - 53 (U/L)    Alkaline Phosphatase 40  39 - 117 (U/L)    Total Bilirubin 0.6  0.3 - 1.2 (mg/dL)    GFR calc non Af Amer 79 (*) >90 (mL/min)    GFR calc Af Amer >90  >90 (mL/min)   PROTIME-INR     Status: Abnormal   Collection Time   10/08/11  1:09 PM      Component Value Range Comment   Prothrombin Time 19.1 (*) 11.6 - 15.2 (seconds)    INR 1.57 (*) 0.00 - 1.49    GLUCOSE, CAPILLARY     Status: Abnormal   Collection Time   10/08/11  1:53 PM      Component Value Range Comment   Glucose-Capillary 156 (*) 70 - 99 (mg/dL)    Comment 1 Documented in Chart      Comment 2 Notify RN     URINALYSIS, ROUTINE W REFLEX MICROSCOPIC     Status: Normal   Collection Time   10/08/11  2:38 PM      Component Value  Range Comment   Color, Urine YELLOW  YELLOW     APPearance CLEAR  CLEAR     Specific Gravity, Urine 1.021  1.005 - 1.030     pH 5.5  5.0 - 8.0     Glucose, UA NEGATIVE  NEGATIVE (mg/dL)    Hgb urine dipstick NEGATIVE  NEGATIVE      Bilirubin Urine NEGATIVE  NEGATIVE     Ketones, ur NEGATIVE  NEGATIVE (mg/dL)    Protein, ur NEGATIVE  NEGATIVE (mg/dL)    Urobilinogen, UA 1.0  0.0 - 1.0 (mg/dL)    Nitrite NEGATIVE  NEGATIVE     Leukocytes, UA NEGATIVE  NEGATIVE  MICROSCOPIC NOT DONE ON URINES WITH NEGATIVE PROTEIN, BLOOD, LEUKOCYTES, NITRITE, OR GLUCOSE <1000 mg/dL.    Radiological Exams on Admission: Dg Chest 2 View  10/08/2011  *RADIOLOGY REPORT*  Clinical Data: Altered mental status.  Weakness.  CHEST - 2 VIEW  Comparison: 09/09/2007  Findings: Prior median sternotomy.  Mild cardiomegaly.  Tortuous thoracic aorta.  Aortic valve replacement.  No pleural effusion or pneumothorax.  Diffuse peribronchial thickening.  Calcified granulomas bilaterally.  Mild volume loss at the right lung base.  IMPRESSION: Cardiomegaly and chronic interstitial thickening. No acute superimposed process.  Original Report Authenticated By: Consuello Bossier, M.D.   Ct Head Wo Contrast  10/08/2011  *RADIOLOGY REPORT*  Clinical Data: Confusion, psychosis  CT HEAD WITHOUT CONTRAST  Technique:  Contiguous axial images were obtained from the base of the skull through the vertex without contrast.  Comparison: None.  Findings: Brain atrophy evident with microvascular ischemic changes throughout the cerebral white matter bilaterally.  Remote infarcts in the right frontal white matter and the left cerebral hemisphere. No acute intracranial hemorrhage, focal edema, mass lesion, definite acute infarction midline shift, herniation, hydrocephalus, or extra-axial fluid collection.  Gray-white matter differentiation maintained.  Cisterns patent.  Cerebellar atrophy as well. Symmetric orbits.  Mastoids and sinuses clear.  IMPRESSION: Atrophy, microvascular ischemic changes and remote infarcts.  No acute process by noncontrast CT.  Original Report Authenticated By: Judie Petit. Ruel Favors, M.D.    Assessment/Plan Principal Problem:  *Altered mental status of unclear etiology  no  infectious or metabolic cause identified so far. UA, chest x-ray and labs are unremarkable. Active Problems:  HYPERTENSION  CORONARY ARTERY DISEASE  FIBRILLATION, ATRIAL History of bioprosthetic aortic valve replacement. Plan: Admit telemetry floor Dementia workup including B12, folate, TSH, RPR. Obtain MRI brain. 1-1 sitter for safety. PT evaluation Psychiatry consult for behavioral issues. Hold oral hypoglycemic agents and place on insulin scale. Hold atenolol for now given sinus bradycardia and continue Coumadin as per pharmacy. Code status discussed with wife who stated to keep him as full code for now will she discuss further with the rest of the family. Rest of plan of care as per his hospital progress  Time Spent on Admission: Approximately 50 minutes   Dejah Droessler 10/08/2011, 4:27 PM

## 2011-10-08 NOTE — Progress Notes (Signed)
Pt had some PVCs, a pause of 2.05 seconds. Pt was unremarkable.  [facility] was notified will give pt 2.5 mg of lopressor until the pt has gotten his stroke evaluation done and can receive PO medications. RN will continue to monitor pt.

## 2011-10-09 DIAGNOSIS — I359 Nonrheumatic aortic valve disorder, unspecified: Secondary | ICD-10-CM

## 2011-10-09 LAB — LIPID PANEL
Cholesterol: 118 mg/dL (ref 0–200)
HDL: 27 mg/dL — ABNORMAL LOW (ref >39)
Total CHOL/HDL Ratio: 4.4 RATIO
Triglycerides: 125 mg/dL (ref <150)

## 2011-10-09 LAB — PROTIME-INR: Prothrombin Time: 19.3 seconds — ABNORMAL HIGH (ref 11.6–15.2)

## 2011-10-09 LAB — GLUCOSE, CAPILLARY
Glucose-Capillary: 207 mg/dL — ABNORMAL HIGH (ref 70–99)
Glucose-Capillary: 175 mg/dL — ABNORMAL HIGH (ref 70–99)

## 2011-10-09 MED ORDER — WARFARIN SODIUM 6 MG PO TABS
6.0000 mg | ORAL_TABLET | Freq: Once | ORAL | Status: AC
Start: 2011-10-09 — End: 2011-10-09
  Administered 2011-10-09: 6 mg via ORAL
  Filled 2011-10-09: qty 1

## 2011-10-09 MED ORDER — DEXTROSE 5 % IV SOLN
10.0000 mg/kg | Freq: Three times a day (TID) | INTRAVENOUS | Status: DC
  Filled 2011-10-09 (×3): qty 18.3

## 2011-10-09 MED ORDER — DEXTROSE 5 % IV SOLN
750.0000 mg | Freq: Three times a day (TID) | INTRAVENOUS | Status: DC
  Administered 2011-10-09 – 2011-10-11 (×6): 750 mg via INTRAVENOUS
  Filled 2011-10-09 (×10): qty 15

## 2011-10-09 MED ORDER — PNEUMOCOCCAL VAC POLYVALENT 25 MCG/0.5ML IJ INJ
0.5000 mL | INJECTION | INTRAMUSCULAR | Status: AC
  Administered 2011-10-10: 0.5 mL via INTRAMUSCULAR
  Filled 2011-10-09: qty 0.5

## 2011-10-09 NOTE — Progress Notes (Signed)
Patient ID: Harold Jones, male   DOB: 09-Dec-1927, 78 y.o.   MRN: [id number] PATIENT DETAILS Name: Harold Jones Age: 78 y.o. Sex: male Date of Birth: 1927/10/07 Admit Date: 10/08/2011 [id number], MD, MD    Interim History: 78 yo Harold Jones male (who does not speak English)  was brought into the hospital because of altered mental status for the last 6-7 days. As per family patient does not have history of dementia however for the last week he had bizarre behavior, visual and auditory hallucinations and aggressive behavior sometimes.  CT head was negative but MRI brain showed 2 acute punctate infarcts.  Patient's mood swinging from being very happy this am to continually crying this afternoon.   He is also likely hard of hearing with some degree of mild dementia.  Subjective: Patient crying.  Reports via interpreter that he hurts all over, and specifically he is having pain in the top of his back.  He pushes the phone away and indicates that his is too tired to talk more.  Objective: Weight change:   Intake/Output Summary (Last 24 hours) at 10/09/11 1117 Last data filed at 10/09/11 1049  Gross per 24 hour  Intake   1176 ml  Output      1 ml  Net   1175 ml   Blood pressure 156/77, pulse 71, temperature 98.7 F (37.1 C), temperature source Oral, resp. rate 20, weight 91.445 kg (201 lb 9.6 oz), SpO2 96.00%. Filed Vitals:   10/09/11 0500 10/09/11 0700 10/09/11 0832 10/09/11 1041  BP: 170/90  165/87 156/77  Pulse: 66  70 71  Temp: 97.8 F (36.6 C)   98.7 F (37.1 C)  TempSrc: Oral   Oral  Resp: 20  20 20   Weight:  91.445 kg (201 lb 9.6 oz)    SpO2: 96%  97% 96%    Physical Exam: General: Sitting on the side of the bed, quietly crying. Lungs: Clear to auscultation bilaterally without wheezes or crackles Cardiovascular: Regular rate and rhythm without murmur gallop or rub normal S1 and S2 Abdomen: Nontender, nondistended, soft, bowel sounds positive, no rebound, no  ascites, no appreciable mass Extremities: No significant cyanosis, clubbing, or edema bilateral lower extremities Neuro - non-focal.   Psych - cooperative, but appears to have wide mood swings.  Bright &  Happy this am, can't stop crying this afternoon. Skin - Red rash with darkened spots, painful to palpation. On top of back approximately C6 distribution.  Basic Metabolic Panel:  Lab 10/08/11 2440  NA 137  K 4.2  CL 103  CO2 24  GLUCOSE 179*  BUN 22  CREATININE 0.85  CALCIUM 9.5  MG --  PHOS --   Liver Function Tests:  Lab 10/08/11 1309  AST 24  ALT 18  ALKPHOS 40  BILITOT 0.6  PROT 6.9  ALBUMIN 3.5   CBC:  Lab 10/08/11 1309  WBC 4.1  NEUTROABS 1.7  HGB 14.6  HCT 40.5  MCV 88.4  PLT 122*   Cardiac Enzymes:  Lab 10/08/11 2107 10/08/11 1309  CKTOTAL 55 --  CKMB 2.2 --  CKMBINDEX -- --  TROPONINI <0.30 <0.30   CBG:  Lab 10/09/11 0819 10/08/11 2155 10/08/11 1842 10/08/11 1353  GLUCAP 124* 142* 74 156*   Hemoglobin A1C:  Lab 10/08/11 1654  HGBA1C 6.2*   Fasting Lipid Panel:  Lab 10/09/11 0843  CHOL 118  HDL 27*  LDLCALC 66  TRIG 102  CHOLHDL 4.4  LDLDIRECT --   Coagulation:  Lab 10/09/11  3382 10/08/11 1309  LABPROT 19.3* 19.1*  INR 1.60* 1.57*    Studies/Results:  MRI Brain IMPRESSION:  1. Punctate acute non hemorrhagic infarct involving the posterior left occipital lobe.  2. Possible second punctate acute non hemorrhagic infarct over the high right frontal lobe.  3. Remote infarcts of the high right frontal lobe and cerebellum.  4. Atrophy extensive white matter disease. This likely reflects the sequelae of chronic microvascular ischemia.  2D Echo Study Conclusions - Left ventricle: The cavity size was normal. There was moderate concentric hypertrophy. Systolic function was normal. The estimated ejection fraction was in the range of 55% to 60%. - Aortic valve: Presumed tissue AVR Not well seen Mild perprosthetic regurgitation and  mildly elevated systolic gradient - Left atrium: The atrium was mildly dilated. - Atrial septum: No defect or patent foramen ovale was identified. - Pulmonary arteries: PA peak pressure: 37mm Hg (S).  Scheduled Meds:   . sodium chloride   Intravenous STAT  . aspirin EC  81 mg Oral Daily  . enalapril  20 mg Oral Daily  . furosemide  20 mg Oral Daily  . insulin aspart  0-9 Units Subcutaneous TID WC  . metoprolol  2.5 mg Intravenous Once  . niacin  2,000 mg Oral QHS  . simvastatin  20 mg Oral q1800  . sodium chloride  1,000 mL Intravenous Once  . sodium chloride  3 mL Intravenous Q12H  . warfarin  6 mg Oral Once  . warfarin  6 mg Oral ONCE-1800  . Warfarin - Pharmacist Dosing Inpatient   Does not apply q1800  . DISCONTD: niacin  2,000 mg Oral QHS   Continuous Infusions:   . sodium chloride 75 mL/hr at 10/09/11 0627   PRN Meds:.acetaminophen, acetaminophen, ondansetron (ZOFRAN) IV, ondansetron, polyethylene glycol, senna-docusate, DISCONTD: ondansetron (ZOFRAN) IV  Anti-infectives:  Anti-infectives    None      Assessment/Plan: Principal Problem:  *Altered mental status Active Problems:  HYPERTENSION  CORONARY ARTERY DISEASE  FIBRILLATION, ATRIAL  *Altered mental status of unclear etiology.   -Patient still altered.  Swinging from crying to extremely happy within minutes. Reportedly hallucinating over the last 6 -7 days. -no infectious or metabolic cause identified so far. UA, chest x-ray and labs are unremarkable. B12, RPR, folate, TSH labs were ordered at admission and are pending. -Consider Psych Consult if not better in the next 24 - 48 hours. 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   *Rash -likely Herpes Zoster.  Will place on Airborne/contact precautions moving to negative pressure room.  Have discussed via telephone with Dr. Algis Liming of ID, will start on IV Acyclovir to be dosed by pharmacy.  *Acute Strokes Appreciate Neuro consult &  recommendations.  Doubt this is the cause of his altered mental status. 1) Coumadin per pharmacy for goal INR 2-3  2) finish stroke workup  3) PT/ OT  *Afib Rate controlled, on coumadin.  *DM CBGs well controlled on insulin as an inpatient.  Hypertension Running permissively high after acute stroke. (SBP 140 - 160s)  DVT Prophylaxis Coumadin  Disposition. Pending.  I have tried to reach his daughter and grand-daughter but get no answer on the telephone.    LOS: 1 day   Stephani Police 10/09/2011, 11:17 AM [phone]

## 2011-10-09 NOTE — Progress Notes (Signed)
Called to help perform NIH and Stroke Swallow Screen on patient, results in doc flow sheets

## 2011-10-09 NOTE — Consult Note (Signed)
[facility] CONSULT NOTE     Reason for Consult:stroke    HPI:    Harold Jones is an 78 y.o. male with Afib who has been on coumadin for 5 years in [place]. He recently came to visit family members in [place]  While here it was noted he had bizarre behavior, visual and auditory hallucinations and aggressive behavior.  Upon arrival his INR was sub therapeutic and MRI showed two tiny infarcts punctate acute non hemorrhagic infarct involving the posterior left occipital lobe, possible second punctate acute non hemorrhagic infarct over the high right frontal lobe and remote infarcts of the high right frontal lobe and cerebellum. He currently is showing no localizing or lateralizing neurologic abnormalities.  Patient speaks no [place].    Past Medical History  Diagnosis Date  . Hypertension   . Diabetes mellitus     Past Surgical History  Procedure Date  . Cardiac surgery     No family history on file.  Social History:  does not have a smoking history on file. He does not have any smokeless tobacco history on file. His alcohol and drug histories not on file.  No Known Allergies  Medications:    Prior to Admission:  Prescriptions prior to admission  Medication Sig Dispense Refill  . aspirin EC 81 MG tablet Take 81 mg by mouth daily.      . enalapril (VASOTEC) 20 MG tablet Take 20 mg by mouth daily.      . furosemide (LASIX) 40 MG tablet Take 20 mg by mouth daily.      . metFORMIN (GLUCOPHAGE) 500 MG tablet Take 500 mg by mouth 2 (two) times daily with a meal.      . metoprolol succinate (TOPROL-XL) 100 MG 24 hr tablet Take 100 mg by mouth every evening. Take with or immediately following a meal.      . niacin (NIASPAN) 1000 MG CR tablet Take 2,000 mg by mouth at bedtime.      . pravastatin (PRAVACHOL) 40 MG tablet Take 40 mg by mouth daily.      Marland Kitchen warfarin (COUMADIN) 3 MG tablet Take 3-4.5 mg by mouth daily. Take 1 tablet on tues and sat, take  1.5 tablet all other days       Scheduled:   . sodium chloride   Intravenous STAT  . aspirin EC  81 mg Oral Daily  . enalapril  20 mg Oral Daily  . furosemide  20 mg Oral Daily  . insulin aspart  0-9 Units Subcutaneous TID WC  . metoprolol  2.5 mg Intravenous Once  . niacin  2,000 mg Oral QHS  . simvastatin  20 mg Oral q1800  . sodium chloride  1,000 mL Intravenous Once  . sodium chloride  3 mL Intravenous Q12H  . warfarin  6 mg Oral Once  . warfarin  6 mg Oral ONCE-1800  . Warfarin - Pharmacist Dosing Inpatient   Does not apply q1800  . DISCONTD: niacin  2,000 mg Oral QHS    Review of Systems - General ROS: negative for - chills, fatigue, fever or hot flashes Hematological and Lymphatic ROS: negative for - bruising, fatigue, jaundice or pallor Endocrine ROS: negative for - hair pattern changes, hot flashes, mood swings or skin changes Respiratory ROS: negative for - cough, hemoptysis, orthopnea or wheezing Cardiovascular ROS: negative for - dyspnea on exertion, orthopnea,  palpitations or shortness of breath Gastrointestinal ROS: negative for - abdominal pain, appetite loss, blood in stools, diarrhea or hematemesis Musculoskeletal ROS: negative for - joint pain, joint stiffness, joint swelling or muscle pain Neurological ROS: See HPI Dermatological ROS: negative for dry skin, pruritus and rash   Blood pressure 156/77, pulse 71, temperature 98.7 F (37.1 C), temperature source Oral, resp. rate 20, weight 91.445 kg (201 lb 9.6 oz), SpO2 96.00%.   Neurologic Examination:   Mental Status: Alert, oriented.  Speech fluent without evidence of aphasia. Able to follow 3 step commands without difficulty. Cranial Nerves: II-Visual fields grossly intact. III/IV/VI-Extraocular movements intact.  Pupils reactive bilaterally. V/VII-Smile symmetric VIII-grossly intact IX/X-normal gag XI-bilateral shoulder shrug XII-midline tongue extension Motor: 5/5 bilaterally with normal tone and  bulk Sensory: Pinprick and light touch intact throughout, bilaterally Deep Tendon Reflexes: 2+ and symmetric throughout Plantars: Downgoing bilaterally Cerebellar: Normal finger-to-nose,   Lab Results  Component Value Date/Time   CHOL 118 10/09/2011  8:43 AM    Results for orders placed during the hospital encounter of 10/08/11 (from the past 48 hour(s))  CBC     Status: Abnormal   Collection Time   10/08/11  1:09 PM      Component Value Range Comment   WBC 4.1  4.0 - 10.5 (K/uL)    RBC 4.58  4.22 - 5.81 (MIL/uL)    Hemoglobin 14.6  13.0 - 17.0 (g/dL)    HCT 95.6  21.3 - 08.6 (%)    MCV 88.4  78.0 - 100.0 (fL)    MCH 31.9  26.0 - 34.0 (pg)    MCHC 36.0  30.0 - 36.0 (g/dL)    RDW 57.8  46.9 - 62.9 (%)    Platelets 122 (*) 150 - 400 (K/uL)   DIFFERENTIAL     Status: Abnormal   Collection Time   10/08/11  1:09 PM      Component Value Range Comment   Neutrophils Relative 43  43 - 77 (%)    Neutro Abs 1.7  1.7 - 7.7 (K/uL)    Lymphocytes Relative 39  12 - 46 (%)    Lymphs Abs 1.6  0.7 - 4.0 (K/uL)    Monocytes Relative 16 (*) 3 - 12 (%)    Monocytes Absolute 0.7  0.1 - 1.0 (K/uL)    Eosinophils Relative 2  0 - 5 (%)    Eosinophils Absolute 0.1  0.0 - 0.7 (K/uL)    Basophils Relative 1  0 - 1 (%)    Basophils Absolute 0.0  0.0 - 0.1 (K/uL)   TROPONIN I     Status: Normal   Collection Time   10/08/11  1:09 PM      Component Value Range Comment   Troponin I <0.30  <0.30 (ng/mL)   COMPREHENSIVE METABOLIC PANEL     Status: Abnormal   Collection Time   10/08/11  1:09 PM      Component Value Range Comment   Sodium 137  135 - 145 (mEq/L)    Potassium 4.2  3.5 - 5.1 (mEq/L)    Chloride 103  96 - 112 (mEq/L)    CO2 24  19 - 32 (mEq/L)    Glucose, Bld 179 (*) 70 - 99 (mg/dL)    BUN 22  6 - 23 (mg/dL)    Creatinine, Ser 5.28  0.50 - 1.35 (mg/dL)    Calcium 9.5  8.4 - 10.5 (mg/dL)    Total Protein 6.9  6.0 - 8.3 (g/dL)  Albumin 3.5  3.5 - 5.2 (g/dL)    AST 24  0 - 37 (U/L) HEMOLYSIS  AT THIS LEVEL MAY AFFECT RESULT   ALT 18  0 - 53 (U/L)    Alkaline Phosphatase 40  39 - 117 (U/L)    Total Bilirubin 0.6  0.3 - 1.2 (mg/dL)    GFR calc non Af Amer 79 (*) >90 (mL/min)    GFR calc Af Amer >90  >90 (mL/min)   PROTIME-INR     Status: Abnormal   Collection Time   10/08/11  1:09 PM      Component Value Range Comment   Prothrombin Time 19.1 (*) 11.6 - 15.2 (seconds)    INR 1.57 (*) 0.00 - 1.49    GLUCOSE, CAPILLARY     Status: Abnormal   Collection Time   10/08/11  1:53 PM      Component Value Range Comment   Glucose-Capillary 156 (*) 70 - 99 (mg/dL)    Comment 1 Documented in Chart      Comment 2 Notify RN     URINALYSIS, ROUTINE W REFLEX MICROSCOPIC     Status: Normal   Collection Time   10/08/11  2:38 PM      Component Value Range Comment   Color, Urine YELLOW  YELLOW     APPearance CLEAR  CLEAR     Specific Gravity, Urine 1.021  1.005 - 1.030     pH 5.5  5.0 - 8.0     Glucose, UA NEGATIVE  NEGATIVE (mg/dL)    Hgb urine dipstick NEGATIVE  NEGATIVE     Bilirubin Urine NEGATIVE  NEGATIVE     Ketones, ur NEGATIVE  NEGATIVE (mg/dL)    Protein, ur NEGATIVE  NEGATIVE (mg/dL)    Urobilinogen, UA 1.0  0.0 - 1.0 (mg/dL)    Nitrite NEGATIVE  NEGATIVE     Leukocytes, UA NEGATIVE  NEGATIVE  MICROSCOPIC NOT DONE ON URINES WITH NEGATIVE PROTEIN, BLOOD, LEUKOCYTES, NITRITE, OR GLUCOSE <1000 mg/dL.  HEMOGLOBIN A1C     Status: Abnormal   Collection Time   10/08/11  4:54 PM      Component Value Range Comment   Hemoglobin A1C 6.2 (*) <5.7 (%)    Mean Plasma Glucose 131 (*) <117 (mg/dL)   GLUCOSE, CAPILLARY     Status: Normal   Collection Time   10/08/11  6:42 PM      Component Value Range Comment   Glucose-Capillary 74  70 - 99 (mg/dL)   CARDIAC PANEL(CRET KIN+CKTOT+MB+TROPI)     Status: Normal   Collection Time   10/08/11  9:07 PM      Component Value Range Comment   Total CK 55  7 - 232 (U/L)    CK, MB 2.2  0.3 - 4.0 (ng/mL)    Troponin I <0.30  <0.30 (ng/mL)    Relative Index  RELATIVE INDEX IS INVALID  0.0 - 2.5    GLUCOSE, CAPILLARY     Status: Abnormal   Collection Time   10/08/11  9:55 PM      Component Value Range Comment   Glucose-Capillary 142 (*) 70 - 99 (mg/dL)    Comment 1 Notify RN      Comment 2 Documented in Chart     GLUCOSE, CAPILLARY     Status: Abnormal   Collection Time   10/09/11  8:19 AM      Component Value Range Comment   Glucose-Capillary 124 (*) 70 - 99 (mg/dL)   PROTIME-INR  Status: Abnormal   Collection Time   10/09/11  8:43 AM      Component Value Range Comment   Prothrombin Time 19.3 (*) 11.6 - 15.2 (seconds)    INR 1.60 (*) 0.00 - 1.49    LIPID PANEL     Status: Abnormal   Collection Time   10/09/11  8:43 AM      Component Value Range Comment   Cholesterol 118  0 - 200 (mg/dL)    Triglycerides 161  <150 (mg/dL)    HDL 27 (*) >09 (mg/dL)    Total CHOL/HDL Ratio 4.4      VLDL 25  0 - 40 (mg/dL)    LDL Cholesterol 66  0 - 99 (mg/dL)     Dg Chest 2 View  6/0/4540  *RADIOLOGY REPORT*  Clinical Data: Altered mental status.  Weakness.  CHEST - 2 VIEW  Comparison: 09/09/2007  Findings: Prior median sternotomy.  Mild cardiomegaly.  Tortuous thoracic aorta.  Aortic valve replacement.  No pleural effusion or pneumothorax.  Diffuse peribronchial thickening.  Calcified granulomas bilaterally.  Mild volume loss at the right lung base.  IMPRESSION: Cardiomegaly and chronic interstitial thickening. No acute superimposed process.  Original Report Authenticated By: Consuello Bossier, M.D.   Ct Head Wo Contrast  10/08/2011  *RADIOLOGY REPORT*  Clinical Data: Confusion, psychosis  CT HEAD WITHOUT CONTRAST  Technique:  Contiguous axial images were obtained from the base of the skull through the vertex without contrast.  Comparison: None.  Findings: Brain atrophy evident with microvascular ischemic changes throughout the cerebral white matter bilaterally.  Remote infarcts in the right frontal white matter and the left cerebral hemisphere. No acute  intracranial hemorrhage, focal edema, mass lesion, definite acute infarction midline shift, herniation, hydrocephalus, or extra-axial fluid collection.  Gray-white matter differentiation maintained.  Cisterns patent.  Cerebellar atrophy as well. Symmetric orbits.  Mastoids and sinuses clear.  IMPRESSION: Atrophy, microvascular ischemic changes and remote infarcts.  No acute process by noncontrast CT.  Original Report Authenticated By: Judie Petit. Ruel Favors, M.D.   Mr Brain Wo Contrast  10/08/2011  *RADIOLOGY REPORT*  Clinical Data: Altered mental status.  Confusion.  Hallucinations.  MRI HEAD WITHOUT CONTRAST  Technique:  Multiplanar, multiecho pulse sequences of the brain and surrounding structures were obtained according to standard protocol without intravenous contrast.  Comparison: CT head without contrast 10/08/2011.  Findings: The diffusion weighted images demonstrate a punctate area of restricted diffusion within the left occipital lobe.  There is a much more subtle punctate area over the posterior right frontal lobe.  The second lesion may be T2 shine through.  Moderate generalized atrophy is present.  Remote white matter infarcts are noted within the right corona radiata.  Moderate periventricular and subcortical T2 and FLAIR hyperintensity is present bilaterally. A remote cortical infarct is noted in the right frontal lobe as well.  Flow is present in the major intracranial arteries.  The patient is status post bilateral lens extractions.  Remote bilateral cerebellar infarcts are evident.  No hemorrhage or mass lesion is present.  The paranasal sinuses and mastoid air cells are clear.  IMPRESSION:  1.  Punctate acute non hemorrhagic infarct involving the posterior left occipital lobe. 2.  Possible second punctate acute non hemorrhagic infarct over the high right frontal lobe. 3.  Remote infarcts of the high right frontal lobe and cerebellum. 4.  Atrophy extensive white matter disease.  This likely reflects the  sequelae of chronic microvascular ischemia.  Original Report Authenticated By: Cristal Deer  W. MATTERN, M.D.   2 D echo WNL  Assessment/Plan:   78 YO male with pmhx Afib, right CVA and no history of dementia who was visiting family from [place].  Patient  presented to hospital with abnormal behavior per family.  MRI showed two small acute nonhemorrhagic infarcts in multiple distributions in setting of sub therapeutic INR.    Recommend: 1) Coumadin per pharmacy for goal INR 2-3 2) finish stroke workup  3) PT/ OT    Felicie Morn PA-C [facility] [phone]  10/09/2011, 12:00 PM

## 2011-10-09 NOTE — Progress Notes (Signed)
Utilization review completed.  

## 2011-10-09 NOTE — Evaluation (Signed)
Physical Therapy Evaluation Patient Details Name: Harold Jones MRN: [id number] DOB: 1928/05/05 Today's Date: 10/09/2011  Problem List:  Patient Active Problem List  Diagnoses  . DYSLIPIDEMIA  . HYPERTENSION  . CORONARY ARTERY DISEASE  . FIBRILLATION, ATRIAL  . BRADYCARDIA  . CVA  . COGNITIVE DEFICITS DUE CEREBROVASCULAR DISEASE  . ASCENDING AORTIC ANEURYSM  . GASTROINTESTINAL HEMORRHAGE  . OSTEOARTHRITIS  . KNEE PAIN, LEFT  . CAROTID BRUIT, RIGHT  . AORTIC VALVE REPLACEMENT, HX OF  . Aortic valve disorders  . GLAUCOMA  . Altered mental status    Past Medical History:  Past Medical History  Diagnosis Date  . Hypertension   . Diabetes mellitus    Past Surgical History:  Past Surgical History  Procedure Date  . Cardiac surgery     PT Assessment/Plan/Recommendation PT Assessment Clinical Impression Statement: Pt is an 78 y/o male (that speaks [place] and no English so interpreter utilized) who was admitted with AMS and determined to have suffered a new CVA.  Presently, pt appears to be approaching his baseline functioning though balance and gait need to be assessed further.  Recommend HHPT for home safety eval and treat as necessary. PT Recommendation/Assessment: Patient will need skilled PT in the acute care venue PT Problem List: Decreased strength;Decreased balance;Decreased mobility;Decreased activity tolerance;Decreased knowledge of precautions PT Therapy Diagnosis : Generalized weakness PT Plan PT Frequency: Min 3X/week PT Treatment/Interventions: Gait training;Functional mobility training;Therapeutic activities;Balance training;Patient/family education PT Recommendation Follow Up Recommendations: Home health PT;Supervision - Intermittent Equipment Recommended: Other (comment);None recommended by PT (To be determined) PT Goals  Acute Rehab PT Goals PT Goal Formulation: Patient unable to participate in goal setting Time For Goal Achievement: 7 days Pt will go  Sit to Stand: Independently PT Goal: Sit to Stand - Progress: Goal set today Pt will Transfer Bed to Chair/Chair to Bed: Independently PT Transfer Goal: Bed to Chair/Chair to Bed - Progress: Goal set today Pt will Ambulate: >150 feet;Independently PT Goal: Ambulate - Progress: Goal set today Pt will Go Up / Down Stairs: 1-2 stairs;with modified independence;with rail(s) PT Goal: Up/Down Stairs - Progress: Goal set today  PT Evaluation Precautions/Restrictions  Precautions Precautions: Fall Precaution Comments: Pt. does not speak English Required Braces or Orthoses: No Restrictions Weight Bearing Restrictions: No Prior Functioning  Home Living Lives With: Spouse Receives Help From:  (uncertain) Type of Home: House Home Layout: One level Home Access: Stairs to enter [employer] of Steps: 1 Bathroom Shower/Tub: Fish farm manager Equipment: Shower chair without back;Straight cane Additional Comments: Interpreter required - utililzed Arts administrator.  Pt. indicates he lives with his wife, in one level home.  2 dtrs and their families live nearby.   Prior Function Level of Independence: Independent with basic ADLs;Independent with gait;Requires assistive device for independence;Independent with transfers Able to Take Stairs?: Yes Driving: No Vocation: Retired Comments: unable to determine if pt. is able to perform home management, has a history of falls, etc. due to communication difficulties Cognition Cognition Arousal/Alertness: Awake/alert Overall Cognitive Status: Difficult to assess (does not follow gesture consistently---?cultural) Difficult to assess due to: non-English speaking Orientation Level: Oriented to person Cognition - Other Comments: Pt. appropriately called for assist before attempting to ambulate to BR.  Pt. mistook therapist for someone else Sensation/Coordination Sensation Light Touch: Appears Intact Coordination Gross Motor  Movements are Fluid and Coordinated: Yes Fine Motor Movements are Fluid and Coordinated: Yes Extremity Assessment RUE Assessment RUE Assessment: Within Functional Limits LUE Assessment LUE Assessment: Within Functional Limits RLE  Assessment RLE Assessment: Within Functional Limits LLE Assessment LLE Assessment: Within Functional Limits (though difficult to assess even with the interpreter) Mobility (including Balance) Bed Mobility Bed Mobility: Yes Supine to Sit: 7: Independent Sit to Supine: 7: Independent Transfers Transfers: Yes Sit to Stand: 5: Supervision Sit to Stand Details (indicate cue type and reason): safety only, pt appeared safe at all times Stand to Sit: 5: Supervision Ambulation/Gait Ambulation/Gait: Yes Ambulation/Gait Assistance: 5: Supervision Ambulation/Gait Assistance Details (indicate cue type and reason): stiff and guarded gait though appeared safe Ambulation Distance (Feet): 25 Feet (in room only, suspected shingles) Assistive device: None Gait Pattern: Within Functional Limits (stiff and guarded only) Stairs: No  Posture/Postural Control Posture/Postural Control: No significant limitations Balance Balance Assessed: Yes Static Sitting Balance Static Sitting - Balance Support: No upper extremity supported Static Sitting - Level of Assistance: 7: Independent Static Sitting - Comment/# of Minutes: 20 Static Standing Balance Static Standing - Balance Support: No upper extremity supported;During functional activity Static Standing - Level of Assistance: 5: Stand by assistance Static Standing - Comment/# of Minutes: 5 [place] - Eyes Closed: 1  Berg Balance Test Sit to Stand: Able to stand without using hands and stabilize independently Standing Unsupported: Able to stand safely 2 minutes Sitting with Back Unsupported but Feet Supported on Floor or Stool: Able to sit safely and securely 2 minutes Stand to Sit: Sits safely with minimal use of  hands Transfers: Able to transfer safely, minor use of hands Standing Unsupported with Eyes Closed: Able to stand 10 seconds safely Standing Ubsupported with Feet Together: Able to place feet together independently and stand for 1 minute with supervision From Standing, Reach Forward with Outstretched Arm: Can reach confidently >25 cm (10") From Standing Position, Pick up Object from Floor: Able to pick up shoe, needs supervision Turn 360 Degrees: Able to turn 360 degrees safely in 4 seconds or less Standing Unsupported, Alternately Place Feet on Step/Stool: Able to complete 4 steps without aid or supervision Exercise    End of Session PT - End of Session Activity Tolerance: Patient tolerated treatment well Patient left: in bed;with call bell in reach Nurse Communication: Mobility status for transfers General Behavior During Session: [facility] for tasks performed (Pt. tearful at times, then smiling and happy) Cognition:  (Unable to accurately asses due to communication)  Rich Paprocki, Eliseo Gum 10/09/2011, 4:23 PM  10/09/2011  [facility], PT [phone] (pager)

## 2011-10-09 NOTE — Evaluation (Signed)
Speech Language Pathology Evaluation Patient Details Name: Harold Jones MRN: [id number] DOB: [birth year] Today's Date: 10/09/2011  Problem List:  Patient Active Problem List  Diagnoses  . DYSLIPIDEMIA  . HYPERTENSION  . CORONARY ARTERY DISEASE  . FIBRILLATION, ATRIAL  . BRADYCARDIA  . CVA  . COGNITIVE DEFICITS DUE CEREBROVASCULAR DISEASE  . ASCENDING AORTIC ANEURYSM  . GASTROINTESTINAL HEMORRHAGE  . OSTEOARTHRITIS  . KNEE PAIN, LEFT  . CAROTID BRUIT, RIGHT  . AORTIC VALVE REPLACEMENT, HX OF  . Aortic valve disorders  . GLAUCOMA  . Altered mental status   Past Medical History:  Past Medical History  Diagnosis Date  . Hypertension   . Diabetes mellitus    Past Surgical History:  Past Surgical History  Procedure Date  . Cardiac surgery     SLP Assessment/Plan/Recommendation Assessment Clinical Impression Statement: Evaluation conducted via phone interpreter in [place]bo[place]. Pt demonstrated functional use of speech and language, interpreter confirmed no evident errors. Pt demonstrated mild to moderate cognitive deficits related to awareness of deficits and complex problem solving. These deficits may also be baseline and may appear worsened due to cultural differences and pts unfamiliarity with medical culture. OT reports that pt was very anxious in her session and became teraful with increased confusion and history reports possible hallucinations and bizarre behavior. This behavior was not present during SLP session and pt was generally appropriate. At this time, no further cognitive therapy is recommended as pt does not demonstrate deficit typical to stroke and is more likely at his cognitive baseline. Pt is able to complete basic functional tasks and would be unlikely to benefit from higher level cognitive therapy without an in person interpreter which is not available.  SLP Recommendation/Assessment: Patient does not need any further Speech Lanaguage Pathology Services No  Skilled Speech Therapy: Patient at baseline level of functioning;Patient will have necessary level of assist by caregiver at discharge SLP Recommendations Equipment Recommended: Other (comment);None recommended by PT (To be determined)  SLP Evaluation Prior Functioning Cognitive/Linguistic Baseline: Information not available Type of Home: House Lives With: Spouse Receives Help From: Family Vocation: Retired  IT consultant Overall Cognitive Status: Impaired Arousal/Alertness: Awake/alert Orientation Level: Oriented to person;Oriented to place;Disoriented to situation Attention: Focused;Sustained;Selective;Alternating Focused Attention: Appears intact Sustained Attention: Appears intact Selective Attention: Appears intact Alternating Attention: Appears intact Memory: Appears intact Awareness: Impaired Awareness Impairment: Intellectual impairment;Emergent impairment Problem Solving: Impaired Problem Solving Impairment: Verbal complex Behaviors: Impulsive Safety/Judgment: Impaired  Comprehension Auditory Comprehension Overall Auditory Comprehension: Appears within functional limits for tasks assessed Yes/No Questions: Within Functional Limits Commands: Within Functional Limits Conversation: Simple  Expression Verbal Expression Overall Verbal Expression: Appears within functional limits for tasks assessed Initiation: No impairment Automatic Speech: Name;Social Response;Counting Level of Generative/Spontaneous Verbalization: Sentence;Conversation Repetition: No impairment Naming: No impairment Pragmatics: Impairment Impairments: Topic appropriateness;Topic maintenance  Oral/Motor Oral Motor/Sensory Function Overall Oral Motor/Sensory Function: Appears within functional limits for tasks assessed Motor Speech Overall Motor Speech: Appears within functional limits for tasks assessed Harlon Ditty, MA CCC-SLP [phone]  Claudine Mouton 10/09/2011, 4:53 PM

## 2011-10-09 NOTE — Progress Notes (Signed)
10/09/11 Nsg 0800 Used interpreter [phone] this am to do assessments, both neuro and shift.  Will continue to monitor.  Forbes Cellar, RN

## 2011-10-09 NOTE — Progress Notes (Signed)
Harold Jones [phone] granddaughter and primary contact, speaks and understands english best in the family.

## 2011-10-09 NOTE — Evaluation (Signed)
Occupational Therapy Evaluation Patient Details Name: Harold Jones MRN: [id number] DOB: Nov 11, 1927 Today's Date: 10/09/2011  Problem List:  Patient Active Problem List  Diagnoses  . DYSLIPIDEMIA  . HYPERTENSION  . CORONARY ARTERY DISEASE  . FIBRILLATION, ATRIAL  . BRADYCARDIA  . CVA  . COGNITIVE DEFICITS DUE CEREBROVASCULAR DISEASE  . ASCENDING AORTIC ANEURYSM  . GASTROINTESTINAL HEMORRHAGE  . OSTEOARTHRITIS  . KNEE PAIN, LEFT  . CAROTID BRUIT, RIGHT  . AORTIC VALVE REPLACEMENT, HX OF  . Aortic valve disorders  . GLAUCOMA  . Altered mental status    Past Medical History:  Past Medical History  Diagnosis Date  . Hypertension   . Diabetes mellitus    Past Surgical History:  Past Surgical History  Procedure Date  . Cardiac surgery     OT Assessment/Plan/Recommendation OT Assessment Clinical Impression Statement: This 78 y.o. male admitted with AMS and new onset CVA.  Pt. speaks [place] as primary language, and does not speak [place].  Interpreter line utilized, but pt. oftentimes spoke over the interpreter, and at times did not directly answer questions - unsure if this is due to [facility], or behavioral, or cognitive deficits.  No family available to provide baseline information.   Based on the information pt. did provide, it appears pt. may be approaching his baseline level of functioning from a physical standpoint.  No focal deficits noted.  He currently requires supervision for all self care activities due to difficulty with communication and uncertainty about behavioral and/or cognitive deficits.  Will continue to asses safety. OT Recommendation/Assessment: Patient will need skilled OT in the acute care venue OT Problem List: Decreased strength;Decreased activity tolerance;Decreased cognition;Decreased safety awareness OT Therapy Diagnosis : Generalized weakness;Cognitive deficits OT Plan OT Frequency: Min 3X/week OT Treatment/Interventions: Self-care/ADL training;DME  and/or AE instruction;Therapeutic activities;Patient/family education OT Recommendation Follow Up Recommendations: Home health OT;No OT follow up (Uncertain of pt baseline) Equipment Recommended: Other (comment) (To be determined) Individuals Consulted Consulted and Agree with Results and Recommendations: Patient OT Goals Acute Rehab OT Goals OT Goal Formulation: With patient Time For Goal Achievement: 7 days ADL Goals Pt Will Perform Grooming: with modified independence;Standing at sink ADL Goal: Grooming - Progress: Goal set today Pt Will Perform Upper Body Bathing: with modified independence;Sitting, chair ADL Goal: Upper Body Bathing - Progress: Goal set today Pt Will Perform Lower Body Bathing: Sit to stand from chair;Sit to stand from bed;with modified independence ADL Goal: Lower Body Bathing - Progress: Goal set today Pt Will Perform Upper Body Dressing: with modified independence;Sitting, bed;Sitting, chair ADL Goal: Upper Body Dressing - Progress: Goal set today Pt Will Perform Lower Body Dressing: with modified independence;Sit to stand from chair;Sit to stand from bed ADL Goal: Lower Body Dressing - Progress: Goal set today Pt Will Transfer to Toilet: with modified independence;Ambulation ADL Goal: Toilet Transfer - Progress: Goal set today Pt Will Perform Toileting - Clothing Manipulation: with modified independence;Standing ADL Goal: Toileting - Clothing Manipulation - Progress: Goal set today Pt Will Perform Tub/Shower Transfer: with supervision;Ambulation;Shower seat without back ADL Goal: Web designer - Progress: Goal set today  OT Evaluation Precautions/Restrictions  Precautions Precautions: Fall Precaution Comments: Pt. does not speak English Restrictions Weight Bearing Restrictions: No Prior Functioning Home Living Lives With: Spouse Receives Help From:  (uncertain) Type of Home: House Home Layout: One level Home Access: Stairs to enter [employer] of Steps: 1 Bathroom Shower/Tub: Fish farm manager Equipment: Shower chair without back;Straight cane Additional Comments: Interpreter required - utililzed Arts administrator.  Pt. indicates he lives with his wife, in one level home.  2 dtrs and their families live nearby.   Prior Function Level of Independence: Independent with basic ADLs;Independent with gait;Requires assistive device for independence Able to Take Stairs?: Yes Driving: No Vocation: Retired Comments: unable to determine if pt. is able to perform home management, has a history of falls, etc. due to communication difficulties ADL ADL Eating/Feeding: Performed;Independent Where Assessed - Eating/Feeding: Edge of bed Grooming: Simulated;Wash/dry hands;Supervision/safety Where Assessed - Grooming: Standing at sink Upper Body Bathing: Simulated;Supervision/safety Where Assessed - Upper Body Bathing: Sitting, chair;Sitting, bed;Unsupported Lower Body Bathing: Simulated;Supervision/safety Where Assessed - Lower Body Bathing: Sit to stand from chair;Sit to stand from bed Upper Body Dressing: Simulated;Supervision/safety Where Assessed - Upper Body Dressing: Sitting, bed Lower Body Dressing: Simulated;Supervision/safety Where Assessed - Lower Body Dressing: Sit to stand from chair;Sit to stand from bed Toilet Transfer: Performed;Supervision/safety Toilet Transfer Method: Proofreader: Comfort height toilet Toileting - Clothing Manipulation: Performed;Supervision/safety Where Assessed - Glass blower/designer Manipulation: Standing Toileting - Hygiene: Performed;Supervision/safety Where Assessed - Toileting Hygiene: Sit on 3-in-1 or toilet Equipment Used:  (pushed IV pole) Ambulation Related to ADLs: Pt. ambulated to BR with close supervision ADL Comments: Utilized interpreter line.  Difficult to accurately glean information from pt. as he oftentimes spoke over the top of the  interpreter, did not pause, etc.  Spoke with interpreter, who reported she had to ask pt. each question at least 2x, and then he oftentimes talked around the question.  Unsure if pt. could be hard of hearing that may be contributing to difficulties.  Assesment completed based on observation of pt. during functional tasks as communication makes it very difficult to complete formal assesment Vision/Perception  Vision - Assessment Vision Assessment: Vision tested Additional Comments: Pt. noted to locate all times in room.  Able to pour soda in cup with no undershooting or overshooting.  No obvious deficits noted during functional assesment Perception Perception: Within Functional Limits Praxis Praxis: Intact Cognition Cognition Arousal/Alertness: Awake/alert Overall Cognitive Status: Difficult to assess Difficult to assess due to: non-English speaking Orientation Level: Oriented to person Cognition - Other Comments: Pt. appropriately called for assist before attempting to ambulate to BR.  Pt. mistook therapist for someone else Sensation/Coordination Sensation Light Touch: Appears Intact Coordination Gross Motor Movements are Fluid and Coordinated: Yes Fine Motor Movements are Fluid and Coordinated: Yes Extremity Assessment RUE Assessment RUE Assessment: Within Functional Limits LUE Assessment LUE Assessment: Within Functional Limits Mobility  Bed Mobility Bed Mobility: Yes Supine to Sit: 7: Independent Sit to Supine: 7: Independent Transfers Transfers: Yes Sit to Stand: 5: Supervision Stand to Sit: 5: Supervision Exercises   End of Session OT - End of Session Activity Tolerance: Patient tolerated treatment well Patient left: in bed;with call bell in reach (bed alarm set) Nurse Communication: Mobility status for transfers (status of eval and info gleaned from interpreter) General Behavior During Session:  (Pt. tearful at times, then smiling and happy) Cognition:  (Unable to  accurately asses due to communication)   Aislinn Feliz M 10/09/2011, 3:31 PM

## 2011-10-09 NOTE — Progress Notes (Signed)
Pt had a b/p of 175/83 was given 2.5mg  of lopressor IV and scheduled po dose of 163/75. B/P moved down to 163/75. Dr. Adela Glimpse was notified and wants nurse to just continue to monitor pt's B/p. RN will continue to monitor.

## 2011-10-09 NOTE — Progress Notes (Signed)
10/09/11 NSG 1135 Pt. To be transferred to 3012, attempted to call report to [facility], RN, called pt. Daughter Jenness Corner at [phone] to inform of the room number, no answer, left message.  Also called pt. Granddaughter Ljraick at [phone] and informed her of pt. Diagnosis and new room number. Will continue to follow and await Ashley,RN to call back and get report on pt.  Forbes Cellar, RN

## 2011-10-09 NOTE — Progress Notes (Signed)
ANTIBIOTIC CONSULT NOTE - INITIAL  Pharmacy Consult for Acyclovir IV Indication:  Zoster encephalitis coverage  No Known Allergies  Patient Measurements: Height: 5\' 11"  (180.3 cm) Weight: 201 lb 9.6 oz (91.445 kg) IBW/kg (Calculated) : 75.3  Dosing Weight: 75 kg  Vital Signs: Temp: 98.1 F (36.7 C) (04/09 1326) Temp src: Oral (04/09 1326) BP: 163/79 mmHg (04/09 1326) Pulse Rate: 73  (04/09 1326)  Labs:  Basename 10/08/11 1309  WBC 4.1  HGB 14.6  PLT 122*  LABCREA --  CREATININE 0.85   Estimated Creatinine Clearance: 77.4 ml/min (by C-G formula based on Cr of 0.85).  Assessment: To begin IV Acyclovir for Zoster encephalitis. Will dose on ideal body weight, ~ 10 mg/kg/dose. Good renal function. Adjustment only needed if creatinine clearance is <50 ml/min.  Goal of Therapy:    Appropriate dose for renal function and infection.  Plan:  Acyclovir 750 mg IV q8hrs. Will follow up renal function and length of therapy.  Dennie Fetters, [place] Pager: [phone] 10/09/2011,3:34 PM

## 2011-10-09 NOTE — Progress Notes (Signed)
   CARE MANAGEMENT NOTE 10/09/2011  Patient:  Harold Jones, Harold Jones   Account Number:  [id number]  Date Initiated:  10/09/2011  Documentation initiated by:  Letha Cape  Subjective/Objective Assessment:   dx ams, cva  admit- lives with spouse.     Action/Plan:   pt eval   Anticipated DC Date:  10/12/2011   Anticipated DC Plan:  HOME W HOME HEALTH SERVICES      DC Planning Services  CM consult      Choice offered to / List presented to:             Status of service:  In process, will continue to follow Medicare Important Message given?   (If response is "NO", the following Medicare IM given date fields will be blank) Date Medicare IM given:   Date Additional Medicare IM given:    Discharge Disposition:    Per UR Regulation:    If discussed at Long Length of Stay Meetings, dates discussed:    Comments:  10/09/11 14:24 Letha Cape RN, BSN [phone] patient lives with spouse, await pt eval.

## 2011-10-09 NOTE — Progress Notes (Signed)
10/09/11 NSG 1300 Pt. Transferred to 3012 via bed with telemetry monitor.  Informed receiving RN of CBG 123 and 1 unit insulin given. Forbes Cellar, RN

## 2011-10-09 NOTE — Progress Notes (Signed)
*  PRELIMINARY RESULTS* Echocardiogram 2D Echocardiogram has been performed.  Harold Jones [facility] 10/09/2011, 10:23 AM

## 2011-10-09 NOTE — Progress Notes (Addendum)
ANTICOAGULATION CONSULT NOTE - Follow Up Consult  Pharmacy Consult for Coumadin Indication: atrial fibrillation  No Known Allergies  Patient Measurements: Weight: 201 lb 9.6 oz (91.445 kg)   Vital Signs: Temp: 97.8 F (36.6 C) (04/09 0500) Temp src: Oral (04/09 0500) BP: 165/87 mmHg (04/09 0832) Pulse Rate: 70  (04/09 0832)  Labs:  Alvira Philips 10/08/11 2107 10/08/11 1309  HGB -- 14.6  HCT -- 40.5  PLT -- 122*  APTT -- --  LABPROT -- 19.1*  INR -- 1.57*  HEPARINUNFRC -- --  CREATININE -- 0.85  CKTOTAL 55 --  CKMB 2.2 --  TROPONINI <0.30 <0.30   The CrCl is unknown because both a height and weight (above a minimum accepted value) are required for this calculation.  Medical History: Past Medical History  Diagnosis Date  . Hypertension   . Diabetes mellitus     Medications:  Coumadin 3mg  on Tues/Sat and 4.5mg  other days.  Last dose 10/07/11.  Assessment: 78 yo M admitted with AMS/confusion x 1 week.  Head CT neg for new events.  Pt on Coumadin PTA for hx AFib.  INR is still sub-therapeutic at 1.57.  He is without noted bleeding complications.    Goal of Therapy:  INR 2-3   Plan:  Repeat Coumadin 6 mg PO x 1 tonight. Daily INR.  Nadara Mustard, Pharm.D., MS Clinical Pharmacist Pager [phone] 10/09/2011 8:47 AM

## 2011-10-09 NOTE — Progress Notes (Signed)
Addendum  Patient seen and examined, chart and data base reviewed.  I agree with the above assessment and plan  For full details please see Mrs. Algis Downs PA. Note.  DM 2, atrial fibrillation and hypertension.  Acute stroke, patient has rash could not rule out Varicella encephalitis.  Clint Lipps Pager: [phone] 10/09/2011, 4:42 PM

## 2011-10-10 ENCOUNTER — Encounter (HOSPITAL_COMMUNITY): Payer: Self-pay | Admitting: *Deleted

## 2011-10-10 DIAGNOSIS — G0481 Other encephalitis and encephalomyelitis: Secondary | ICD-10-CM

## 2011-10-10 LAB — GLUCOSE, CAPILLARY: Glucose-Capillary: 182 mg/dL — ABNORMAL HIGH (ref 70–99)

## 2011-10-10 LAB — FOLATE RBC: RBC Folate: 521 ng/mL (ref >=366)

## 2011-10-10 LAB — PROTIME-INR: INR: 1.71 — ABNORMAL HIGH (ref 0.00–1.49)

## 2011-10-10 MED ORDER — WARFARIN SODIUM 6 MG PO TABS
6.0000 mg | ORAL_TABLET | Freq: Once | ORAL | Status: AC
  Administered 2011-10-10: 6 mg via ORAL
  Filled 2011-10-10: qty 1

## 2011-10-10 MED ORDER — SENNOSIDES-DOCUSATE SODIUM 8.6-50 MG PO TABS
2.0000 | ORAL_TABLET | Freq: Every day | ORAL | Status: DC
  Administered 2011-10-10: 2 via ORAL
  Filled 2011-10-10: qty 2

## 2011-10-10 NOTE — Progress Notes (Signed)
Notified Dr. Onalee Hua to clarify patient's IVF orders.  Patient was transferred from unit 3000 at 2035, patient had NS@125cc /hr infusing. Dr. Onalee Hua gave order for NSL. Will continue to monitor patient. Nelda Marseille, RN

## 2011-10-10 NOTE — Consult Note (Signed)
Date of Admission:  10/08/2011  Date of Consult:  10/10/2011  Reason for Consult: question of zoster and encephalits Referring Physician: Dr. Jerral Ralph   HPI: Harold Jones is an 78 y.o. male. old [place] man with history of diabetes, hypertension, atrial fibrillation ahe was brought into the hospital because of altered mental status for the last 6-7 days. He was found on MRI to have acute non hemorrhagic strokes. He also was found to have a dermatomal rash that was concerning to primary team for [facility] and he was started on IV acyclovir after their conversation with me over the phone yesterday. APparrently the pt is much improved overnight. We were formally consulted today regarding his management. We spoke to him via an interpreter *(and my pharmacy resident also conversed with him as well). The patient himself claims to have had the lesions on his back for more than a year--though we do not know how reliable a story that is. He is oriented to month and year  But thinks he is in a hospital outside of [place].     Past Medical History  Diagnosis Date  . Hypertension   . Diabetes mellitus     Past Surgical History  Procedure Date  . Cardiac surgery   ergies:   No Known Allergies   Medications: I have reviewed patients current medications as documented in Epic Anti-infectives     Start     Dose/Rate Route Frequency Ordered Stop   10/09/11 1600   acyclovir (ZOVIRAX) 750 mg in dextrose 5 % 150 mL IVPB     Comments: For likely Zoster Encephalitis      750 mg 165 mL/hr over 60 Minutes Intravenous Every 8 hours 10/09/11 1538     10/09/11 1530   acyclovir (ZOVIRAX) 915 mg in dextrose 5 % 150 mL IVPB  Status:  Discontinued     Comments: For likely Zoster Encephalitis      10 mg/kg  91.4 kg 168.3 mL/hr over 60 Minutes Intravenous 3 times per day 10/09/11 1519 10/09/11 1538          Social History:  reports that he has never smoked. He has never used smokeless tobacco. His alcohol and  drug histories not on file.  History reviewed. No pertinent family history.  As in HPI and primary teams notes otherwise 12 point review of systems is negative  Blood pressure 137/83, pulse 96, temperature 98.2 F (36.8 C), temperature source Oral, resp. rate 19, height 5\' 11"  (1.803 m), weight 199 lb 4.7 oz (90.4 kg), SpO2 96.00%. General: Alert and awake, oriented month and year not in any acute distress. HEENT: anicteric sclera, pupils reactive to light and accommodation, EOMI, oropharynx clear and without exudate CVS regular rate, normal r,  no murmur rubs or gallops Chest: clear to auscultation bilaterally, no wheezing, rales or rhonchi Abdomen: soft nontender, nondistended, normal bowel sounds, Extremities: no  clubbing or edema noted bilaterally Skin: scattered flat darkly pigmented areas on back, not much is way of vesicles present and only on back not extending into the arm of c5 or c6 dermatome. He has no underlying pain Neuro: other than his confusion  nonfocal, strength and sensation intact   Results for orders placed during the hospital encounter of 10/08/11 (from the past 48 hour(s))  GLUCOSE, CAPILLARY     Status: Normal   Collection Time   10/08/11  6:42 PM      Component Value Range Comment   Glucose-Capillary 74  70 - 99 (mg/dL)  CARDIAC PANEL(CRET KIN+CKTOT+MB+TROPI)     Status: Normal   Collection Time   10/08/11  9:07 PM      Component Value Range Comment   Total CK 55  7 - 232 (U/L)    CK, MB 2.2  0.3 - 4.0 (ng/mL)    Troponin I <0.30  <0.30 (ng/mL)    Relative Index RELATIVE INDEX IS INVALID  0.0 - 2.5    GLUCOSE, CAPILLARY     Status: Abnormal   Collection Time   10/08/11  9:55 PM      Component Value Range Comment   Glucose-Capillary 142 (*) 70 - 99 (mg/dL)    Comment 1 Notify RN      Comment 2 Documented in Chart     GLUCOSE, CAPILLARY     Status: Abnormal   Collection Time   10/09/11  8:19 AM      Component Value Range Comment   Glucose-Capillary 124 (*)  70 - 99 (mg/dL)   RPR     Status: Normal   Collection Time   10/09/11  8:43 AM      Component Value Range Comment   RPR NON REACTIVE  NON REACTIVE    VITAMIN B12     Status: Normal   Collection Time   10/09/11  8:43 AM      Component Value Range Comment   Vitamin B-12 238  211 - 911 (pg/mL)   TSH     Status: Normal   Collection Time   10/09/11  8:43 AM      Component Value Range Comment   TSH 0.454  0.350 - 4.500 (uIU/mL)   FOLATE RBC     Status: Normal   Collection Time   10/09/11  8:43 AM      Component Value Range Comment   RBC Folate 521  >=366 (ng/mL) Reference range not established for pediatric patients.  PROTIME-INR     Status: Abnormal   Collection Time   10/09/11  8:43 AM      Component Value Range Comment   Prothrombin Time 19.3 (*) 11.6 - 15.2 (seconds)    INR 1.60 (*) 0.00 - 1.49    HEMOGLOBIN A1C     Status: Abnormal   Collection Time   10/09/11  8:43 AM      Component Value Range Comment   Hemoglobin A1C 6.1 (*) <5.7 (%)    Mean Plasma Glucose 128 (*) <117 (mg/dL)   LIPID PANEL     Status: Abnormal   Collection Time   10/09/11  8:43 AM      Component Value Range Comment   Cholesterol 118  0 - 200 (mg/dL)    Triglycerides 161  <150 (mg/dL)    HDL 27 (*) >09 (mg/dL)    Total CHOL/HDL Ratio 4.4      VLDL 25  0 - 40 (mg/dL)    LDL Cholesterol 66  0 - 99 (mg/dL)   GLUCOSE, CAPILLARY     Status: Abnormal   Collection Time   10/09/11 12:22 PM      Component Value Range Comment   Glucose-Capillary 123 (*) 70 - 99 (mg/dL)   GLUCOSE, CAPILLARY     Status: Abnormal   Collection Time   10/09/11  7:10 PM      Component Value Range Comment   Glucose-Capillary 207 (*) 70 - 99 (mg/dL)   GLUCOSE, CAPILLARY     Status: Abnormal   Collection Time   10/09/11  9:29 PM  Component Value Range Comment   Glucose-Capillary 175 (*) 70 - 99 (mg/dL)   GLUCOSE, CAPILLARY     Status: Abnormal   Collection Time   10/10/11  7:38 AM      Component Value Range Comment   Glucose-Capillary 165  (*) 70 - 99 (mg/dL)   GLUCOSE, CAPILLARY     Status: Abnormal   Collection Time   10/10/11 12:06 PM      Component Value Range Comment   Glucose-Capillary 208 (*) 70 - 99 (mg/dL)   PROTIME-INR     Status: Abnormal   Collection Time   10/10/11 12:28 PM      Component Value Range Comment   Prothrombin Time 20.4 (*) 11.6 - 15.2 (seconds)    INR 1.71 (*) 0.00 - 1.49    GLUCOSE, CAPILLARY     Status: Abnormal   Collection Time   10/10/11  4:03 PM      Component Value Range Comment   Glucose-Capillary 222 (*) 70 - 99 (mg/dL)    No results found for this basename: sdes, specrequest, cult, reptstatus   Mr Brain Wo Contrast  10/08/2011  *RADIOLOGY REPORT*  Clinical Data: Altered mental status.  Confusion.  Hallucinations.  MRI HEAD WITHOUT CONTRAST  Technique:  Multiplanar, multiecho pulse sequences of the brain and surrounding structures were obtained according to standard protocol without intravenous contrast.  Comparison: CT head without contrast 10/08/2011.  Findings: The diffusion weighted images demonstrate a punctate area of restricted diffusion within the left occipital lobe.  There is a much more subtle punctate area over the posterior right frontal lobe.  The second lesion may be T2 shine through.  Moderate generalized atrophy is present.  Remote white matter infarcts are noted within the right corona radiata.  Moderate periventricular and subcortical T2 and FLAIR hyperintensity is present bilaterally. A remote cortical infarct is noted in the right frontal lobe as well.  Flow is present in the major intracranial arteries.  The patient is status post bilateral lens extractions.  Remote bilateral cerebellar infarcts are evident.  No hemorrhage or mass lesion is present.  The paranasal sinuses and mastoid air cells are clear.  IMPRESSION:  1.  Punctate acute non hemorrhagic infarct involving the posterior left occipital lobe. 2.  Possible second punctate acute non hemorrhagic infarct over the high  right frontal lobe. 3.  Remote infarcts of the high right frontal lobe and cerebellum. 4.  Atrophy extensive white matter disease.  This likely reflects the sequelae of chronic microvascular ischemia.  Original Report Authenticated By: Jamesetta Orleans. MATTERN, M.D.     No results found for this or any previous visit (from the past 720 hour(s)).   Impression/Recommendation  78 year old [place] with atrial fibrillation other medical problems admitted with CVA and confusion, found to have rash that was thought to be zoster, currently on IV acyclovir for possible disseminated zoster (to CNS)  1) Possible Zoster:  The grand daughter informed me that the rash on his back is new and was first noticed on Monday. It is NOT entirely typical for Zoster For now I will continue the IV acyclovir.  2) Confusion: possiblly related to disseminated Zoster but not a clear picture. Would also check RPR and HIV (if not checked already)  3) IP: Since the lesions on Back are already crusting can dc airborne.      Thank you so much for this interesting consult,   Harold Jones 10/10/2011, 5:48 PM   [phone] (pager) [phone] (office)

## 2011-10-10 NOTE — Progress Notes (Signed)
Patient ID: Harold Jones, male   DOB: Aug 21, 1927, 78 y.o.   MRN: [id number]  PATIENT DETAILS Name: Harold Jones Age: 78 y.o. Sex: male Date of Birth: [birth year] Admit Date: 10/08/2011 WUJ:[email or web address], MD, MD    Interim History: 78 yo [place] male (who does not speak English)  was brought into the hospital because of altered mental status for the last 6-7 days. As per family patient does not have history of dementia however for the last week he had bizarre behavior, visual and auditory hallucinations and aggressive behavior sometimes.  CT head was negative but MRI brain showed 2 acute punctate infarcts.  Patient's mood swinging from being very happy this am to continually crying this afternoon.   Further discussion with his daughter - He did not have a rash on his back prior to admission.  Rather he had a red spot that was first noticed in the ED.  It looked like redness from a possible fall.  He has lived in this country for [place] (he is not visiting). He was not having any altered mental status or signs of senility before last week.  His daughter will come up tomorrow night (approx 5:20 pm) after work to meet with the physician.     Subjective: Via interpreter.  Patient complains of pain in right arm from IV.  He doesn't understand why he's here (other than he is old).  No one told him he was having hallucinations.  He communicates clearly with the interpreter, but becomes emotional (crying).  He offers blessings and thanks for what we are doing for him.  He asks if he can please go home with his daughter when she comes to visit him tomorrow night.  Objective: Weight change: -1.045 kg (-2 lb 4.9 oz)  Intake/Output Summary (Last 24 hours) at 10/10/11 1647 Last data filed at 10/10/11 1500  Gross per 24 hour  Intake    588 ml  Output      0 ml  Net    588 ml   Blood pressure 137/83, pulse 96, temperature 98.2 F (36.8 C), temperature source Oral, resp. rate 19, height 5'  11" (1.803 m), weight 90.4 kg (199 lb 4.7 oz), SpO2 96.00%. Filed Vitals:   10/10/11 0627 10/10/11 0903 10/10/11 1100 10/10/11 1500  BP: 132/70 130/68 166/79 137/83  Pulse: 76  78 96  Temp: 98.7 F (37.1 C)  98.2 F (36.8 C) 98.2 F (36.8 C)  TempSrc: Oral     Resp: 17  18 19   Height:      Weight: 90.4 kg (199 lb 4.7 oz)     SpO2: 97%  97% 96%    Physical Exam: General: Lying comfortably in bed.  At times smiles, at times is emotional and cries quietly.  HOH Lungs: Clear to auscultation bilaterally without wheezes or crackles Cardiovascular: Regular rate and rhythm without murmur gallop or rub normal S1 and S2 Abdomen: Nontender, nondistended, soft, bowel sounds positive, no rebound, no ascites, no appreciable mass Neuro - non-focal.   Skin - Red rash with darkened spots, painful to palpation. On top of back approximately C6 distribution.  Rash appears less erythematous today.  Slightly less swelling.  No obvious pustules.  Basic Metabolic Panel:  Lab 10/08/11 6578  NA 137  K 4.2  CL 103  CO2 24  GLUCOSE 179*  BUN 22  CREATININE 0.85  CALCIUM 9.5  MG --  PHOS --   Liver Function Tests:  Lab 10/08/11 1309  AST 24  ALT 18  ALKPHOS 40  BILITOT 0.6  PROT 6.9  ALBUMIN 3.5   CBC:  Lab 10/08/11 1309  WBC 4.1  NEUTROABS 1.7  HGB 14.6  HCT 40.5  MCV 88.4  PLT 122*   Cardiac Enzymes:  Lab 10/08/11 2107 10/08/11 1309  CKTOTAL 55 --  CKMB 2.2 --  CKMBINDEX -- --  TROPONINI <0.30 <0.30   CBG:  Lab 10/10/11 1603 10/10/11 1206 10/10/11 0738 10/09/11 2129 10/09/11 1910 10/09/11 1222  GLUCAP 222* 208* 165* 175* 207* 123*   Hemoglobin A1C:  Lab 10/09/11 0843  HGBA1C 6.1*   Fasting Lipid Panel:  Lab 10/09/11 0843  CHOL 118  HDL 27*  LDLCALC 66  TRIG 409  CHOLHDL 4.4  LDLDIRECT --   Coagulation:  Lab 10/10/11 1228 10/09/11 0843 10/08/11 1309  LABPROT 20.4* 19.3* 19.1*  INR 1.71* 1.60* 1.57*    Studies/Results:  MRI Brain IMPRESSION:  1.  Punctate acute non hemorrhagic infarct involving the posterior left occipital lobe.  2. Possible second punctate acute non hemorrhagic infarct over the high right frontal lobe.  3. Remote infarcts of the high right frontal lobe and cerebellum.  4. Atrophy extensive white matter disease. This likely reflects the sequelae of chronic microvascular ischemia.  2D Echo Study Conclusions - Left ventricle: The cavity size was normal. There was moderate concentric hypertrophy. Systolic function was normal. The estimated ejection fraction was in the range of 55% to 60%. - Aortic valve: Presumed tissue AVR Not well seen Mild perprosthetic regurgitation and mildly elevated systolic gradient - Left atrium: The atrium was mildly dilated. - Atrial septum: No defect or patent foramen ovale was identified. - Pulmonary arteries: PA peak pressure: 37mm Hg (S).  Carotid Dopplers completed.  Awaiting results.  Scheduled Meds:    . acyclovir  750 mg Intravenous Q8H  . aspirin EC  81 mg Oral Daily  . enalapril  20 mg Oral Daily  . furosemide  20 mg Oral Daily  . insulin aspart  0-9 Units Subcutaneous TID WC  . niacin  2,000 mg Oral QHS  . pneumococcal 23 valent vaccine  0.5 mL Intramuscular Tomorrow-1000  . senna-docusate  2 tablet Oral QHS  . simvastatin  20 mg Oral q1800  . sodium chloride  3 mL Intravenous Q12H  . warfarin  6 mg Oral ONCE-1800  . warfarin  6 mg Oral ONCE-1800  . Warfarin - Pharmacist Dosing Inpatient   Does not apply q1800   Continuous Infusions:    . DISCONTD: sodium chloride 75 mL/hr at 10/09/11 0627   PRN Meds:.acetaminophen, acetaminophen, ondansetron (ZOFRAN) IV, ondansetron, polyethylene glycol, senna-docusate  Anti-infectives:  Anti-infectives     Start     Dose/Rate Route Frequency Ordered Stop   10/09/11 1600   acyclovir (ZOVIRAX) 750 mg in dextrose 5 % 150 mL IVPB     Comments: For likely Zoster Encephalitis      750 mg 165 mL/hr over 60 Minutes Intravenous Every 8  hours 10/09/11 1538     10/09/11 1530   acyclovir (ZOVIRAX) 915 mg in dextrose 5 % 150 mL IVPB  Status:  Discontinued     Comments: For likely Zoster Encephalitis      10 mg/kg  91.4 kg 168.3 mL/hr over 60 Minutes Intravenous 3 times per day 10/09/11 1519 10/09/11 1538          Assessment/Plan: Principal Problem:  *Altered mental status Active Problems:  HYPERTENSION  CORONARY ARTERY DISEASE  FIBRILLATION, ATRIAL  *Altered mental status of  unclear etiology.   -No longer appears altered.   Reportedly hallucinating over the last 6 -7 days prior to admission. -no infectious or metabolic cause identified so far. UA, chest x-ray and labs are unremarkable. B12, RPR, folate, TSH labs were ordered at admission and are pending.  -Started IV acyclovir 4/9 for treatment of possible HSV Zoster +/- Zoster Encephalitis    *Rash -? Herpes Zoster.  Appears atypical for zoster but is very tender and located in C6 dermatome.  Less than 78 week old.  Appears to be resolving today after 24 hours on IV Acyclovir.    Will ask ID if this should be changed to PO and what the duration should be - and whether or not he needs the airborne/contact precautions.  *Acute Strokes Appreciate Neuro consult & recommendations.  Doubt this is the cause of his altered mental status. Coumadin per pharmacy for goal INR 2-3   *Afib Rate controlled, on coumadin.  Still sub therapeutic.  Pharmacy dosing.  I will d/c tele.  *DM CBGs well controlled on insulin as an inpatient.  Hypertension Running permissively high after acute stroke. (SBP 140 - 160s)  DVT Prophylaxis Coumadin  Disposition. Patient wants to go home tomorrow evening.  This will depend on: 1.  Is coumadin level therapeutic? 2.  Is he still altered (family may be best judge of baseline) 3.  ID recommendations for treatment of rash / possibly encephalitis.  Algis Downs, PA-C [facility] Pager: [phone]   LOS: 2 days    10/10/2011, 4:47 PM  Attending I have seen and examined the patient,I agree with the assessment and plan as outlined above. I have consulted ID for further input.Family to come tomorrow afternoon for further collaborative history as well. Dr Windell Norfolk

## 2011-10-10 NOTE — Progress Notes (Signed)
ANTICOAGULATION CONSULT NOTE - Follow Up Consult  Pharmacy Consult for Coumadin Indication: atrial fibrillation  No Known Allergies  Patient Measurements: Height: 5\' 11"  (180.3 cm) Weight: 199 lb 4.7 oz (90.4 kg) IBW/kg (Calculated) : 75.3    Vital Signs: Temp: 98.7 F (37.1 C) (04/10 0627) Temp src: Oral (04/10 0627) BP: 132/70 mmHg (04/10 0627) Pulse Rate: 76  (04/10 0627)  Labs:  Basename 10/09/11 0843 10/08/11 2107 10/08/11 1309  HGB -- -- 14.6  HCT -- -- 40.5  PLT -- -- 122*  APTT -- -- --  LABPROT 19.3* -- 19.1*  INR 1.60* -- 1.57*  HEPARINUNFRC -- -- --  CREATININE -- -- 0.85  CKTOTAL -- 55 --  CKMB -- 2.2 --  TROPONINI -- <0.30 <0.30   Estimated Creatinine Clearance: 77 ml/min (by C-G formula based on Cr of 0.85).  Medical History: Past Medical History  Diagnosis Date  . Hypertension   . Diabetes mellitus     Medications:  Coumadin 3mg  on Tues/Sat and 4.5mg  other days.  Last dose 10/07/11.  Assessment: 78 yo M admitted with AMS/confusion x 1 week and noted with CVA.  Pt on Coumadin PTA for hx AFib.  INR is still sub-therapeutic at 1.6 after 2 doses of 6mg  coumadin. .   Goal of Therapy:  INR 2-3   Plan:  -Hesitant to increase today based on home regimen  -Will give 6mg  coumadin again today and consider increase in am  Harland German, Pharm D 10/10/2011 8:51 AM

## 2011-10-10 NOTE — Progress Notes (Signed)
Stroke Team Progress Note  HISTORY Tarence Francesconi is an 78 y.o. male with Afib who has been on coumadin for 5 years in [place]. He recently came to visit family members in [place] While here it was noted he had bizarre behavior, visual and auditory hallucinations and aggressive behavior. Upon arrival his INR was sub therapeutic and MRI showed two tiny infarcts punctate acute non hemorrhagic infarct involving the posterior left occipital lobe, possible second punctate acute non hemorrhagic infarct over the high right frontal lobe and remote infarcts of the high right frontal lobe and cerebellum. He currently is showing no localizing or lateralizing neurologic abnormalities. Patient speaks no [place].  Patient was not a TPA candidate secondary to unknown time of symptom onset. He was admitted for further evaluation and treatment.   SUBJECTIVE No family is at the bedside. Communication with Dr. Pearlean Brownie by gestures.  OBJECTIVE Most recent Vital Signs: Filed Vitals:   10/09/11 1500 10/09/11 2033 10/10/11 0215 10/10/11 0627  BP:  165/74 126/70 132/70  Pulse:  69 63 76  Temp:  98.6 F (37 C) 97.5 F (36.4 C) 98.7 F (37.1 C)  TempSrc:  Oral Oral Oral  Resp:  20 17 17   Height: 5\' 11"  (1.803 m)     Weight:    90.4 kg (199 lb 4.7 oz)  SpO2:  97% 93% 97%   CBG (last 3)  Basename 10/10/11 0738 10/09/11 2129 10/09/11 1910  GLUCAP 165* 175* 207*   Intake/Output from previous day: 04/09 0701 - 04/10 0700 In: 648 [P.O.:480; I.V.:3; IV Piggyback:165] Out: -   IV Fluid Intake:     . sodium chloride 75 mL/hr at 10/09/11 0627   MEDICATIONS    . sodium chloride   Intravenous STAT  . acyclovir  750 mg Intravenous Q8H  . aspirin EC  81 mg Oral Daily  . enalapril  20 mg Oral Daily  . furosemide  20 mg Oral Daily  . insulin aspart  0-9 Units Subcutaneous TID WC  . niacin  2,000 mg Oral QHS  . pneumococcal 23 valent vaccine  0.5 mL Intramuscular Tomorrow-1000  . simvastatin  20 mg Oral q1800  .  sodium chloride  3 mL Intravenous Q12H  . warfarin  6 mg Oral ONCE-1800  . Warfarin - Pharmacist Dosing Inpatient   Does not apply q1800  . DISCONTD: acyclovir  10 mg/kg Intravenous Q8H   PRN:  acetaminophen, acetaminophen, ondansetron (ZOFRAN) IV, ondansetron, polyethylene glycol, senna-docusate  Diet:  Cardiac thin liquids Activity:  Bedrest, Up with assistance DVT Prophylaxis:  warfarin  CLINICALLY SIGNIFICANT STUDIES CBC    Component Value Date/Time   WBC 4.1 10/08/2011 1309   RBC 4.58 10/08/2011 1309   HGB 14.6 10/08/2011 1309   HCT 40.5 10/08/2011 1309   PLT 122* 10/08/2011 1309   MCV 88.4 10/08/2011 1309   MCH 31.9 10/08/2011 1309   MCHC 36.0 10/08/2011 1309   RDW 13.2 10/08/2011 1309   LYMPHSABS 1.6 10/08/2011 1309   MONOABS 0.7 10/08/2011 1309   EOSABS 0.1 10/08/2011 1309   BASOSABS 0.0 10/08/2011 1309   CMP    Component Value Date/Time   NA 137 10/08/2011 1309   K 4.2 10/08/2011 1309   CL 103 10/08/2011 1309   CO2 24 10/08/2011 1309   GLUCOSE 179* 10/08/2011 1309   GLUCOSE 133* 05/14/2006 0728   BUN 22 10/08/2011 1309   CREATININE 0.85 10/08/2011 1309   CALCIUM 9.5 10/08/2011 1309   PROT 6.9 10/08/2011 1309   ALBUMIN 3.5  10/08/2011 1309   AST 24 10/08/2011 1309   ALT 18 10/08/2011 1309   ALKPHOS 40 10/08/2011 1309   BILITOT 0.6 10/08/2011 1309   GFRNONAA 79* 10/08/2011 1309   GFRAA >90 10/08/2011 1309   COAGS Lab Results  Component Value Date   INR 1.60* 10/09/2011   INR 1.57* 10/08/2011   INR 2.0 09/11/2011   PROTIME 22.0 12/16/2008   PROTIME 24.3 12/02/2008   Lipid Panel    Component Value Date/Time   CHOL 118 10/09/2011 0843   TRIG 125 10/09/2011 0843   HDL 27* 10/09/2011 0843   CHOLHDL 4.4 10/09/2011 0843   VLDL 25 10/09/2011 0843   LDLCALC 66 10/09/2011 0843   HgbA1C  Lab Results  Component Value Date   HGBA1C 6.1* 10/09/2011   Cardiac Panel (last 3 results)  Basename 10/08/11 2107 10/08/11 1309  CKTOTAL 55 --  CKMB 2.2 --  TROPONINI <0.30 <0.30  RELINDX RELATIVE INDEX IS INVALID --   Urinalysis      Component Value Date/Time   COLORURINE YELLOW 10/08/2011 1438   APPEARANCEUR CLEAR 10/08/2011 1438   LABSPEC 1.021 10/08/2011 1438   PHURINE 5.5 10/08/2011 1438   GLUCOSEU NEGATIVE 10/08/2011 1438   HGBUR NEGATIVE 10/08/2011 1438   BILIRUBINUR NEGATIVE 10/08/2011 1438   KETONESUR NEGATIVE 10/08/2011 1438   PROTEINUR NEGATIVE 10/08/2011 1438   UROBILINOGEN 1.0 10/08/2011 1438   NITRITE NEGATIVE 10/08/2011 1438   LEUKOCYTESUR NEGATIVE 10/08/2011 1438   Urine Drug Screen  No results found for this basename: labopia, cocainscrnur, labbenz, amphetmu, thcu, labbarb    Alcohol Level No results found for this basename: eth   CT of the brain   Atrophy, microvascular ischemic changes and remote infarcts.  No acute process by noncontrast CT.  MRI of the brain   1.  Punctate acute non hemorrhagic infarct involving the posterior left occipital lobe. 2.  Possible second punctate acute non hemorrhagic infarct over the high right frontal lobe. 3.  Remote infarcts of the high right frontal lobe and cerebellum. 4.  Atrophy extensive white matter disease.  This likely reflects the sequelae of chronic microvascular ischemia.    MRA of the brain    2D Echocardiogram  EF 55-60% with no source of embolus.   Carotid Doppler    CXR  Cardiomegaly and chronic interstitial thickening. No acute superimposed process.  EKG  nonspecific ST and T waves changes, atrial fibrillation, rate 80, frequent PVC's noted, LVH with ORS widening, left anterior fascicular block.   Physical Exam   Pleasant middle aged [place] male not in distress..Awake alert. Afebrile. Head is nontraumatic. Neck is supple without bruit. Hearing is normal. Cardiac exam no murmur or gallop. Lungs are clear to auscultation. Distal pulses are well felt.  Neurologic Exam Limited due to absence of interpreter and patient`s lack of knowledge of English.: Awake  Alert Language cannot be reliably evaluated..eye movements full without nystagmus. Face symmetric. Tongue  midline. Normal strength, tone, reflexes and coordination. Normal sensation. Gait deferred.  ASSESSMENT Mr. Derrien Anschutz is a 78 y.o. male with a punctate acute non hemorrhagic infarct involving the posterior left occipital lobe and a second punctate acute non hemorrhagic infarct over the high right frontal lobe. Infarcts secondary to known atrial fibrillation. On aspirin 81 mg orally every day and warfarin prior to admission. Now on aspirin 81 mg orally every day and warfarin for secondary stroke prevention.  -Agree altered mental status not related to current small infarcts.  -MRA of the brain canceled, stated it  was a duplicate. Needs vascular imaging. Given likely known etiology of stroke, will order TCD. For completeness, will also check carotids for other potential cause of stroke.  Hospital day # 2  TREATMENT/PLAN -Continue aspirin 81 mg orally every day and warfarin for secondary stroke prevention. -TCD -follow up carotid doppler  SHARON BIBY, AVNP, ANP-BC, GNP-BC [facility] Pager: [phone] 10/10/2011 8:28 AM  Dr. Delia Heady, Stroke Center Medical Director, has personally reviewed chart, pertinent data, examined the patient and developed the plan of care.

## 2011-10-10 NOTE — Progress Notes (Signed)
Bilateral carotid artery duplex completed.  Preliminary report is no evidence of significant ICA stenosis. 

## 2011-10-11 LAB — GLUCOSE, CAPILLARY: Glucose-Capillary: 179 mg/dL — ABNORMAL HIGH (ref 70–99)

## 2011-10-11 LAB — HIV ANTIBODY (ROUTINE TESTING W REFLEX): HIV: NONREACTIVE

## 2011-10-11 MED ORDER — VALACYCLOVIR HCL 500 MG PO TABS
1000.0000 mg | ORAL_TABLET | Freq: Three times a day (TID) | ORAL | Status: DC
  Administered 2011-10-11 – 2011-10-12 (×4): 1000 mg via ORAL
  Filled 2011-10-11 (×6): qty 2

## 2011-10-11 MED ORDER — OXYCODONE-ACETAMINOPHEN 5-325 MG PO TABS
1.0000 | ORAL_TABLET | Freq: Four times a day (QID) | ORAL | Status: DC | PRN
  Administered 2011-10-11: 1 via ORAL
  Filled 2011-10-11: qty 1

## 2011-10-11 MED ORDER — WARFARIN SODIUM 4 MG PO TABS
4.5000 mg | ORAL_TABLET | Freq: Once | ORAL | Status: AC
  Administered 2011-10-11: 4.5 mg via ORAL
  Filled 2011-10-11: qty 1

## 2011-10-11 MED ORDER — GABAPENTIN 300 MG PO CAPS
300.0000 mg | ORAL_CAPSULE | Freq: Two times a day (BID) | ORAL | Status: DC
  Administered 2011-10-11 – 2011-10-12 (×3): 300 mg via ORAL
  Filled 2011-10-11 (×4): qty 1

## 2011-10-11 NOTE — Progress Notes (Signed)
   CARE MANAGEMENT NOTE 10/11/2011  Patient:  Harold Jones, Harold Jones   Account Number:  [id number]  Date Initiated:  10/09/2011  Documentation initiated by:  Letha Cape  Subjective/Objective Assessment:   dx ams, cva  admit- lives with spouse.     Action/Plan:   pt eval   Anticipated DC Date:  10/11/2011   Anticipated DC Plan:  HOME W HOME HEALTH SERVICES      DC Planning Services  CM consult      Choice offered to / List presented to:  C-1 Patient        HH arranged  HH-2 PT  HH-3 OT      [facility] agency  [facility]   Status of service:  Completed, signed off Medicare Important Message given?   (If response is "NO", the following Medicare IM given date fields will be blank) Date Medicare IM given:   Date Additional Medicare IM given:    Discharge Disposition:  HOME W HOME HEALTH SERVICES  Per UR Regulation:    If discussed at Long Length of Stay Meetings, dates discussed:    Comments:  10/10/11 12:36 Letha Cape RN, BSN patient for discharge today, pointed to [facility] on agency list patient shook his head yes,  referral made to [facility] for hhpt/ot, Lupita Leash notified.  Soc will begin 24-48 hrs post discharge.  10/09/11 14:24 Letha Cape RN, BSN [phone] patient lives with spouse, await pt eval.

## 2011-10-11 NOTE — Progress Notes (Signed)
Occupational Therapy Treatment and Discharge Patient Details Name: Harold Jones MRN: [id number] DOB: Jan 13, 1928 Today's Date: 10/11/2011  OT Assessment/Plan OT Assessment/Plan Comments on Treatment Session: This 78 yo has made excellent progress and no longer needs OT services, we will sign off. OT Plan: Discharge plan needs to be updated Follow Up Recommendations: No OT follow up Equipment Recommended: None recommended by OT OT Goals ADL Goals ADL Goal: Grooming - Progress: Met ADL Goal: Upper Body Bathing - Progress:  (Pt should not have an issue) ADL Goal: Lower Body Bathing - Progress:  (Pt should not have an issue) ADL Goal: Upper Body Dressing - Progress:  (Pt should not have an issue) ADL Goal: Lower Body Dressing - Progress:  (Pt should not have an issue) ADL Goal: Toilet Transfer - Progress: Met ADL Goal: Toileting - Clothing Manipulation - Progress:  (Pt should not have an issue) ADL Goal: Tub/Shower Transfer - Progress:  (Pt should not have an issue)  OT Treatment Precautions/Restrictions  Precautions Precautions: None Precaution Comments: Pt speaks only [place]--used interpreter phones with him Restrictions Weight Bearing Restrictions: No   ADL ADL Grooming: Performed;Wash/dry face;Teeth care;Independent Where Assessed - Grooming: Unsupported;Standing at sink Toilet Transfer: Simulated;Independent (Bed, down hallway, back to room to sink) Toilet Transfer Method: Ambulating Equipment Used:  (None) Ambulation Related to ADLs: Independent. Do not foresee any problems with BADLs, pt's functional mobility is not an issue and will carry over into BADLs Mobility  Bed Mobility Bed Mobility: Yes Supine to Sit: 7: Independent Sit to Supine: 7: Independent Transfers Sit to Stand: 5: Supervision;From bed Sit to Stand Details (indicate cue type and reason): generally safe transfer technique Stand to Sit: 5: Supervision Exercises    End of Session OT - End of  Session Equipment Utilized During Treatment:  (None) Activity Tolerance: Patient tolerated treatment well Patient left: in bed;with call bell in reach;with bed alarm set Nurse Communication: Mobility status for ambulation General Behavior During Session: [facility] for tasks performed Cognition: [facility] for tasks performed  Evette Georges [phone] 10/11/2011, 6:19 PM

## 2011-10-11 NOTE — Progress Notes (Signed)
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 aspirin EC  81 mg Oral Daily  . enalapril  20 mg Oral Daily  . furosemide  20 mg Oral Daily  . insulin aspart  0-9 Units Subcutaneous TID WC  . niacin  2,000 mg Oral QHS  . senna-docusate  2 tablet Oral QHS  . simvastatin  20 mg Oral q1800  . sodium chloride  3 mL Intravenous Q12H  . valACYclovir  1,000 mg Oral TID  . warfarin  4.5 mg Oral ONCE-1800  . warfarin  6 mg Oral ONCE-1800  . Warfarin - Pharmacist Dosing Inpatient   Does not apply q1800  . DISCONTD: acyclovir  750 mg Intravenous Q8H   Continuous Infusions:   . DISCONTD: sodium chloride 75 mL/hr at 10/09/11 0627   PRN Meds:.acetaminophen, acetaminophen, ondansetron (ZOFRAN) IV, ondansetron, polyethylene glycol, senna-docusate   Objective: Weight change:   Intake/Output Summary (Last 24 hours) at 10/11/11 1111 Last data filed at 10/11/11 0919  Gross per 24 hour  Intake    825 ml  Output      0 ml  Net    825 ml   Blood pressure 155/83, pulse 100, temperature 98 F (36.7 C), temperature source Oral, resp. rate 18, height 5\' 11"  (1.803 m), weight 199 lb 4.7 oz (90.4 kg), SpO2 95.00%. Temp:  [97.7 F (36.5 C)-98.2 F (36.8 C)] 98 F (36.7 C) (04/11  0527) Pulse Rate:  [82-100] 100  (04/11 0527) Resp:  [16-19] 18  (04/11 0527) BP: (137-155)/(72-83) 155/83 mmHg (04/11 0527) SpO2:  [93 %-96 %] 95 % (04/11 0527)  Physical Exam: General: Alert and awake,  HEENT: anicteric sclera, pupils reactive to light and accommodation, EOMI, oropharynx clear and without exudate  CVS regular rate, normal r, no murmur rubs or gallops  Chest: clear to auscultation bilaterally, no wheezing, rales or rhonchi  Abdomen: soft nontender, nondistended, normal bowel sounds,  Extremities: no clubbing or edema noted bilaterally  Skin: scattered flat darkly pigmented areas on back, not much is way of vesicles present and only on back not extending into the arm of c5 or c6 dermatome. He has no underlying pain  Neuro: other than his confusion nonfocal, strength and sensation intact   Lab Results:  Basename 10/08/11 1309  WBC 4.1  HGB 14.6  HCT 40.5  PLT 122*    BMET  Basename 10/08/11 1309  NA 137  K 4.2  CL 103  CO2 24  GLUCOSE 179*  BUN 22  CREATININE 0.85  CALCIUM 9.5  Micro Results: No results found for this or any previous visit (from the past 240 hour(s)).  Studies/Results: No results found.    Assessment/Plan: Harold Jones is a 78 y.o. male  [place] with atrial fibrillation other medical problems admitted with CVA and confusion, found to have rash that was thought to be zoster, currently on IV acyclovir for possible disseminated zoster (to CNS)   1) Possible Zoster with CNS involvement:  I am not convinced he actually has Zoster encephalitis--the treatment for which is 21 days of high dose IV acylcovir --I think a reasonable plan would be change him to high dose valtrex 1 g tid today, observe over night and finish 21 day course of total antivial therapy   2) Confusion: possiblly related to disseminated Zoster but not a clear picture. HIV and RPR pending      LOS: 3 days   Acey Lav 10/11/2011, 11:11 AM

## 2011-10-11 NOTE — Progress Notes (Signed)
Patient ID: Harold Jones, male   DOB: [birth year], 78 y.o.   MRN: [id number] Patient ID: Harold Jones, male   DOB: Oct 09, 1927, 78 y.o.   MRN: [id number]  PATIENT DETAILS Name: Harold Jones Age: 78 y.o. Sex: male Date of Birth: [birth year] Admit Date: 10/08/2011 FAO:[email or web address], MD, MD    Interim History: 78 yo [place] male (who does not speak English)  was brought into the hospital because of altered mental status for the last 6-7 days. As per family patient does not have history of dementia however for the last week he had bizarre behavior, visual and auditory hallucinations and aggressive behavior sometimes.  CT head was negative but MRI brain showed 2 acute punctate infarcts.  Patient's mood swinging from being very happy this am to continually crying this afternoon.   Further discussion with his daughter - He did not have a rash on his back prior to admission.  Rather he had a red spot that was first noticed in the ED.  It looked like redness from a possible fall.  He has lived in this country for 10 years (he is not visiting). He was not having any altered mental status or signs of senility before last week.  His daughter will come up tomorrow night (approx 5:20 pm) after work to meet with the physician.     Subjective: Via interpreter.  Patient complains of pain in right arm/Right shoulder area pain today as well. He seems awake and alert. Family meeting 5:20 pm today  Objective: Weight change:   Intake/Output Summary (Last 24 hours) at 10/11/11 1427 Last data filed at 10/11/11 1406  Gross per 24 hour  Intake   1065 ml  Output      0 ml  Net   1065 ml   Blood pressure 169/78, pulse 84, temperature 97.9 F (36.6 C), temperature source Oral, resp. rate 19, height 5\' 11"  (1.803 m), weight 90.4 kg (199 lb 4.7 oz), SpO2 98.00%. Filed Vitals:   10/10/11 1500 10/10/11 2125 10/11/11 0527 10/11/11 1406  BP: 137/83 154/72 155/83 169/78  Pulse: 96 82 100 84  Temp: 98.2  F (36.8 C) 97.7 F (36.5 C) 98 F (36.7 C) 97.9 F (36.6 C)  TempSrc:  Oral Oral   Resp: 19 16 18 19   Height:      Weight:      SpO2: 96% 93% 95% 98%    Physical Exam: General: Lying comfortably in bed.  At times smiles, at times is emotional and cries quietly.  HOH Lungs: Clear to auscultation bilaterally without wheezes or crackles Cardiovascular: Regular rate and rhythm without murmur gallop or rub normal S1 and S2 Abdomen: Nontender, nondistended, soft, bowel sounds positive, no rebound, no ascites, no appreciable mass Neuro - non-focal.   Skin - Red rash with darkened spots, painful to palpation. On top of back approximately C6 distribution.  Rash appears less erythematous today.  Slightly less swelling.  No obvious pustules.  Basic Metabolic Panel:  Lab 10/08/11 4132  NA 137  K 4.2  CL 103  CO2 24  GLUCOSE 179*  BUN 22  CREATININE 0.85  CALCIUM 9.5  MG --  PHOS --   Liver Function Tests:  Lab 10/08/11 1309  AST 24  ALT 18  ALKPHOS 40  BILITOT 0.6  PROT 6.9  ALBUMIN 3.5   CBC:  Lab 10/08/11 1309  WBC 4.1  NEUTROABS 1.7  HGB 14.6  HCT 40.5  MCV 88.4  PLT 122*   Cardiac Enzymes:  Lab  10/08/11 2107 10/08/11 1309  CKTOTAL 55 --  CKMB 2.2 --  CKMBINDEX -- --  TROPONINI <0.30 <0.30   CBG:  Lab 10/11/11 1221 10/11/11 0821 10/10/11 2202 10/10/11 1603 10/10/11 1206 10/10/11 0738  GLUCAP 197* 179* 182* 222* 208* 165*   Hemoglobin A1C:  Lab 10/09/11 0843  HGBA1C 6.1*   Fasting Lipid Panel:  Lab 10/09/11 0843  CHOL 118  HDL 27*  LDLCALC 66  TRIG 161  CHOLHDL 4.4  LDLDIRECT --   Coagulation:  Lab 10/11/11 0700 10/10/11 1228 10/09/11 0843 10/08/11 1309  LABPROT 26.0* 20.4* 19.3* 19.1*  INR 2.34* 1.71* 1.60* 1.57*    Studies/Results:  MRI Brain IMPRESSION:  1. Punctate acute non hemorrhagic infarct involving the posterior left occipital lobe.  2. Possible second punctate acute non hemorrhagic infarct over the high right frontal lobe.   3. Remote infarcts of the high right frontal lobe and cerebellum.  4. Atrophy extensive white matter disease. This likely reflects the sequelae of chronic microvascular ischemia.  2D Echo Study Conclusions - Left ventricle: The cavity size was normal. There was moderate concentric hypertrophy. Systolic function was normal. The estimated ejection fraction was in the range of 55% to 60%. - Aortic valve: Presumed tissue AVR Not well seen Mild perprosthetic regurgitation and mildly elevated systolic gradient - Left atrium: The atrium was mildly dilated. - Atrial septum: No defect or patent foramen ovale was identified. - Pulmonary arteries: PA peak pressure: 37mm Hg (S).  Carotid Dopplers completed.  Awaiting results.  Scheduled Meds:    . aspirin EC  81 mg Oral Daily  . enalapril  20 mg Oral Daily  . furosemide  20 mg Oral Daily  . gabapentin  300 mg Oral BID  . insulin aspart  0-9 Units Subcutaneous TID WC  . niacin  2,000 mg Oral QHS  . senna-docusate  2 tablet Oral QHS  . simvastatin  20 mg Oral q1800  . sodium chloride  3 mL Intravenous Q12H  . valACYclovir  1,000 mg Oral TID  . warfarin  4.5 mg Oral ONCE-1800  . warfarin  6 mg Oral ONCE-1800  . Warfarin - Pharmacist Dosing Inpatient   Does not apply q1800  . DISCONTD: acyclovir  750 mg Intravenous Q8H   Continuous Infusions:    . 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   -No longer appears altered.   Reportedly hallucinating over the last 6 -7 days  prior to admission. -no infectious or metabolic cause identified so far. UA, chest x-ray and labs are unremarkable. B12, RPR, folate, TSH labs were ordered at admission and are pending. -Started IV acyclovir 4/9 for treatment of possible HSV Zoster +/- Zoster Encephalitis-discussed with Dr Zenaida Niece [place]-ID-to change to valtrex today, monitor overnight and then d/c home-if continues to do well, without AMS  *Rash -? Herpes Zoster.  Appears atypical for zoster but is very tender and located in C6 dermatome.  Less than 51 week old.  Appears to be resolving today after 24 hours on IV Acyclovir.     -change to oral valtrex as outlined above  *Acute Strokes Appreciate Neuro consult & recommendations.  Doubt this is the cause of his altered mental status. Coumadin per pharmacy for goal INR 2-3   *Afib Rate controlled, on coumadin.  Still sub therapeutic.  Pharmacy dosing.  I will d/c tele.  *DM CBGs well controlled on insulin as an inpatient.  Hypertension Running permissively high after acute stroke. (SBP 140 - 160s)  DVT Prophylaxis Coumadin  Dr Windell Norfolk

## 2011-10-11 NOTE — Progress Notes (Signed)
Stroke Team Progress Note  HISTORY Harold Jones is an 78 y.o. male with Afib who has been on coumadin for 5 years in [place]. He recently came to visit family members in [place] While here it was noted he had bizarre behavior, visual and auditory hallucinations and aggressive behavior. Upon arrival his INR was sub therapeutic and MRI showed two tiny infarcts punctate acute non hemorrhagic infarct involving the posterior left occipital lobe, possible second punctate acute non hemorrhagic infarct over the high right frontal lobe and remote infarcts of the high right frontal lobe and cerebellum. He currently is showing no localizing or lateralizing neurologic abnormalities. Patient speaks no [place].  Patient was not a TPA candidate secondary to unknown time of symptom onset. He was admitted for further evaluation and treatment.  SUBJECTIVE Patient without complaints.stable no changes.  OBJECTIVE Most recent Vital Signs: Filed Vitals:   10/10/11 1100 10/10/11 1500 10/10/11 2125 10/11/11 0527  BP: 166/79 137/83 154/72 155/83  Pulse: 78 96 82 100  Temp: 98.2 F (36.8 C) 98.2 F (36.8 C) 97.7 F (36.5 C) 98 F (36.7 C)  TempSrc:   Oral Oral  Resp: 18 19 16 18   Height:      Weight:      SpO2: 97% 96% 93% 95%   CBG (last 3)  Basename 10/11/11 0821 10/10/11 2202 10/10/11 1603  GLUCAP 179* 182* 222*   Intake/Output from previous day: 04/10 0701 - 04/11 0700 In: 948 [P.O.:450; I.V.:3; IV Piggyback:495] Out: -   IV Fluid Intake:     . DISCONTD: sodium chloride 75 mL/hr at 10/09/11 0627   MEDICATIONS    . acyclovir  750 mg Intravenous Q8H  . aspirin EC  81 mg Oral Daily  . enalapril  20 mg Oral Daily  . furosemide  20 mg Oral Daily  . insulin aspart  0-9 Units Subcutaneous TID WC  . niacin  2,000 mg Oral QHS  . senna-docusate  2 tablet Oral QHS  . simvastatin  20 mg Oral q1800  . sodium chloride  3 mL Intravenous Q12H  . warfarin  4.5 mg Oral ONCE-1800  . warfarin  6 mg Oral  ONCE-1800  . Warfarin - Pharmacist Dosing Inpatient   Does not apply q1800   PRN:  acetaminophen, acetaminophen, ondansetron (ZOFRAN) IV, ondansetron, polyethylene glycol, senna-docusate  Diet:  Cardiac thin liquids Activity:  Bedrest, Up with assistance DVT Prophylaxis:  warfarin  CLINICALLY SIGNIFICANT STUDIES CBC    Component Value Date/Time   WBC 4.1 10/08/2011 1309   RBC 4.58 10/08/2011 1309   HGB 14.6 10/08/2011 1309   HCT 40.5 10/08/2011 1309   PLT 122* 10/08/2011 1309   MCV 88.4 10/08/2011 1309   MCH 31.9 10/08/2011 1309   MCHC 36.0 10/08/2011 1309   RDW 13.2 10/08/2011 1309   LYMPHSABS 1.6 10/08/2011 1309   MONOABS 0.7 10/08/2011 1309   EOSABS 0.1 10/08/2011 1309   BASOSABS 0.0 10/08/2011 1309   CMP    Component Value Date/Time   NA 137 10/08/2011 1309   K 4.2 10/08/2011 1309   CL 103 10/08/2011 1309   CO2 24 10/08/2011 1309   GLUCOSE 179* 10/08/2011 1309   GLUCOSE 133* 05/14/2006 0728   BUN 22 10/08/2011 1309   CREATININE 0.85 10/08/2011 1309   CALCIUM 9.5 10/08/2011 1309   PROT 6.9 10/08/2011 1309   ALBUMIN 3.5 10/08/2011 1309   AST 24 10/08/2011 1309   ALT 18 10/08/2011 1309   ALKPHOS 40 10/08/2011 1309   BILITOT 0.6  10/08/2011 1309   GFRNONAA 79* 10/08/2011 1309   GFRAA >90 10/08/2011 1309   COAGS Lab Results  Component Value Date   INR 2.34* 10/11/2011   INR 1.71* 10/10/2011   INR 1.60* 10/09/2011   PROTIME 22.0 12/16/2008   PROTIME 24.3 12/02/2008   Lipid Panel    Component Value Date/Time   CHOL 118 10/09/2011 0843   TRIG 125 10/09/2011 0843   HDL 27* 10/09/2011 0843   CHOLHDL 4.4 10/09/2011 0843   VLDL 25 10/09/2011 0843   LDLCALC 66 10/09/2011 0843   HgbA1C  Lab Results  Component Value Date   HGBA1C 6.1* 10/09/2011   Cardiac Panel (last 3 results)   Basename 10/08/11 2107 10/08/11 1309  CKTOTAL 55 --  CKMB 2.2 --  TROPONINI <0.30 <0.30  RELINDX RELATIVE INDEX IS INVALID --   Urinalysis    Component Value Date/Time   COLORURINE YELLOW 10/08/2011 1438   APPEARANCEUR CLEAR 10/08/2011 1438   LABSPEC  1.021 10/08/2011 1438   PHURINE 5.5 10/08/2011 1438   GLUCOSEU NEGATIVE 10/08/2011 1438   HGBUR NEGATIVE 10/08/2011 1438   BILIRUBINUR NEGATIVE 10/08/2011 1438   KETONESUR NEGATIVE 10/08/2011 1438   PROTEINUR NEGATIVE 10/08/2011 1438   UROBILINOGEN 1.0 10/08/2011 1438   NITRITE NEGATIVE 10/08/2011 1438   LEUKOCYTESUR NEGATIVE 10/08/2011 1438   Urine Drug Screen  No results found for this basename: labopia,  cocainscrnur,  labbenz,  amphetmu,  thcu,  labbarb    Alcohol Level No results found for this basename: eth   CT of the brain   Atrophy, microvascular ischemic changes and remote infarcts.  No acute process by noncontrast CT.  MRI of the brain   1.  Punctate acute non hemorrhagic infarct involving the posterior left occipital lobe. 2.  Possible second punctate acute non hemorrhagic infarct over the high right frontal lobe. 3.  Remote infarcts of the high right frontal lobe and cerebellum. 4.  Atrophy extensive white matter disease.  This likely reflects the sequelae of chronic microvascular ischemia.    MRA of the brain  canceled  2D Echocardiogram  EF 55-60% with no source of embolus.   Carotid Doppler  No internal carotid artery stenosis bilaterally. Vertebrals with antegrade flow bilaterally.  CXR  Cardiomegaly and chronic interstitial thickening. No acute superimposed process.  EKG  nonspecific ST and T waves changes, atrial fibrillation, rate 80, frequent PVC's noted, LVH with ORS widening, left anterior fascicular block.   Physical Exam   Pleasant middle aged [place] male not in distress..Awake alert. Afebrile. Head is nontraumatic. Neck is supple without bruit. Hearing is normal. Cardiac exam no murmur or gallop. Lungs are clear to auscultation. Distal pulses are well felt.  Neurologic Exam Limited due to absence of interpreter and patient`s lack of knowledge of English.: Awake  Alert Language cannot be reliably evaluated..eye movements full without nystagmus. Face symmetric. Tongue  midline. Normal strength, tone, reflexes and coordination. Normal sensation. Gait deferred.   ASSESSMENT Mr. Harold Jones is a 78 y.o. male with a punctate acute non hemorrhagic infarct involving the posterior left occipital lobe and a second punctate acute non hemorrhagic infarct over the high right frontal lobe. Infarcts secondary to known atrial fibrillation. On aspirin 81 mg orally every day and warfarin prior to admission. Now on aspirin 81 mg orally every day and warfarin for secondary stroke prevention.  -Agree altered mental status not related to current small infarcts.  -MRA of the brain canceled, stated it was a duplicate. Needs vascular imaging. Given likely  known etiology of stroke, will order TCD. For completeness, will also check carotids for other potential cause of stroke.  Hospital day # 3  TREATMENT/PLAN -Continue aspirin 81 mg orally every day and warfarin for secondary stroke prevention. -Stroke Service will sign off. Follow up with Dr. Pearlean Brownie in 2 months.  Harold Jones, ANP-BC, GNP-BC [facility] Pager: [phone] 10/11/2011 9:19 AM  Dr. Delia Heady, Stroke Center Medical Director, has personally reviewed chart, pertinent data, examined the patient and developed the plan of care.

## 2011-10-11 NOTE — Progress Notes (Signed)
Physical Therapy Treatment  @@@All  PT needs have been met.  Pt is at or close to baseline function and is safe at an independent level in a home-like env.  D/C from acute PT at this time.@@@   Patient Details Name: Harold Jones MRN: [id number] DOB: 10-07-1927 Today's Date: 10/11/2011  PT Assessment/Plan  PT - Assessment/Plan Comments on Treatment Session: pt has demonstrated safe mobility in a homelike environment and is safe to go home from a PT perspective.  At this point there are no follow up needs. PT Plan: Discharge plan needs to be updated;All goals met and education completed, patient dischaged from PT services;Other (comment) (will D/C from PT) Follow Up Recommendations: No PT follow up Equipment Recommended: Defer to next venue PT Goals  Acute Rehab PT Goals PT Goal: Sit to Stand - Progress: Met PT Transfer Goal: Bed to Chair/Chair to Bed - Progress: Met PT Goal: Ambulate - Progress: Other (comment) (unable to allow pt independence on this floor) PT Goal: Up/Down Stairs - Progress: Other (comment) (not addressed)  PT Treatment Precautions/Restrictions  Precautions Precautions:  (very low risk of falling in a home-like environment) Precaution Comments: Pt. does not speak English Required Braces or Orthoses DO NOT USE: No Restrictions Weight Bearing Restrictions: No Mobility (including Balance) Bed Mobility Bed Mobility: Yes Supine to Sit: 7: Independent Sit to Supine: 7: Independent Transfers Transfers: Yes Sit to Stand: 5: Supervision;From bed Sit to Stand Details (indicate cue type and reason): generally safe transfer technique Stand to Sit: 5: Supervision Ambulation/Gait Ambulation/Gait: Yes Ambulation/Gait Assistance: 5: Supervision (steady gait--supervision based on fall policy) Ambulation/Gait Assistance Details (indicate cue type and reason): steady gait with cadence change, turns and scanning Ambulation Distance (Feet): 240 Feet Assistive device:  None Gait Pattern: Within Functional Limits Stairs: No  Posture/Postural Control Posture/Postural Control: No significant limitations Static Standing Balance Static Standing - Balance Support: No upper extremity supported;During functional activity Static Standing - Level of Assistance: Other (comment) (supervision --based on fall policy only) Exercise    End of Session PT - End of Session Activity Tolerance: Patient tolerated treatment well Patient left: in bed;with bed alarm set Nurse Communication: Mobility status for transfers General Behavior During Session: [facility] for tasks performed Cognition: [facility] for tasks performed  Senya Hinzman, Eliseo Gum 10/11/2011, 4:10 PM  10/11/2011  Seligman Bing, PT [phone] (pager)

## 2011-10-11 NOTE — Progress Notes (Signed)
ANTICOAGULATION CONSULT NOTE - Follow Up Consult  Pharmacy Consult for Coumadin Indication: atrial fibrillation  No Known Allergies  Patient Measurements: Height: 5\' 11"  (180.3 cm) Weight: 199 lb 4.7 oz (90.4 kg) IBW/kg (Calculated) : 75.3    Vital Signs: Temp: 98 F (36.7 C) (04/11 0527) Temp src: Oral (04/11 0527) BP: 155/83 mmHg (04/11 0527) Pulse Rate: 100  (04/11 0527)  Labs:  Basename 10/11/11 0700 10/10/11 1228 10/09/11 0843 10/08/11 2107 10/08/11 1309  HGB -- -- -- -- 14.6  HCT -- -- -- -- 40.5  PLT -- -- -- -- 122*  APTT -- -- -- -- --  LABPROT 26.0* 20.4* 19.3* -- --  INR 2.34* 1.71* 1.60* -- --  HEPARINUNFRC -- -- -- -- --  CREATININE -- -- -- -- 0.85  CKTOTAL -- -- -- 55 --  CKMB -- -- -- 2.2 --  TROPONINI -- -- -- <0.30 <0.30   Estimated Creatinine Clearance: 77 ml/min (by C-G formula based on Cr of 0.85).  Medical History: Past Medical History  Diagnosis Date  . Hypertension   . Diabetes mellitus     Medications:  Coumadin 3mg  on Tues/Sat and 4.5mg  other days.  Last dose 10/07/11.  Assessment: 78 yo M admitted with AMS/confusion x 1 week and noted with CVA.  Pt on Coumadin PTA for hx AFib.  INR is now therapeutic after three doses of 6mg , will decrease slightly today.  Was subtherapeutic on admit, so may need an increased home regimen.  No bleeding noted.    Goal of Therapy:  INR 2-3   Plan:  - Decrease Coumadin to 4.5 mg po x 1 - F/U daily INR  Rolland Porter, Pharm.D., BCPS Clinical Pharmacist Pager: [phone]

## 2011-10-11 NOTE — ED Provider Notes (Signed)
Medical screening examination/treatment/procedure(s) were conducted as a shared visit with non-physician practitioner(s) and myself.  I personally evaluated the patient during the encounter  Taleeya Blondin, MD 10/11/11 1318 

## 2011-10-12 DIAGNOSIS — I639 Cerebral infarction, unspecified: Secondary | ICD-10-CM | POA: Diagnosis present

## 2011-10-12 DIAGNOSIS — B029 Zoster without complications: Secondary | ICD-10-CM | POA: Diagnosis present

## 2011-10-12 LAB — GLUCOSE, CAPILLARY: Glucose-Capillary: 179 mg/dL — ABNORMAL HIGH (ref 70–99)

## 2011-10-12 MED ORDER — OXYCODONE-ACETAMINOPHEN 5-325 MG PO TABS: 1.0000 | ORAL_TABLET | Freq: Four times a day (QID) | ORAL | Status: AC | PRN

## 2011-10-12 MED ORDER — GABAPENTIN 300 MG PO CAPS: 300.0000 mg | ORAL_CAPSULE | Freq: Two times a day (BID) | ORAL | Status: DC

## 2011-10-12 MED ORDER — FUROSEMIDE 20 MG PO TABS: 20.0000 mg | ORAL_TABLET | Freq: Every day | ORAL | Status: DC

## 2011-10-12 MED ORDER — WARFARIN SODIUM 3 MG PO TABS: 3.0000 mg | ORAL_TABLET | ORAL | Status: DC

## 2011-10-12 MED ORDER — METOPROLOL SUCCINATE ER 100 MG PO TB24: 100.0000 mg | ORAL_TABLET | Freq: Every day | ORAL | Status: AC

## 2011-10-12 MED ORDER — VALACYCLOVIR HCL 1 G PO TABS: 1000.0000 mg | ORAL_TABLET | Freq: Three times a day (TID) | ORAL | Status: AC

## 2011-10-12 MED ORDER — WARFARIN SODIUM 4 MG PO TABS
4.5000 mg | ORAL_TABLET | ORAL | Status: DC
  Filled 2011-10-12: qty 1

## 2011-10-12 NOTE — Discharge Instructions (Signed)
STROKE/TIA DISCHARGE INSTRUCTIONS SMOKING Cigarette smoking nearly doubles your risk of having a stroke & is the single most alterable risk factor  If you smoke or have smoked in the last 12 months, you are advised to quit smoking for your health.  Most of the excess cardiovascular risk related to smoking disappears within a year of stopping.  Ask you doctor about anti-smoking medications  [place] Quit Line: 1-800-QUIT NOW  Free Smoking Cessation Classes [phone]  CHOLESTEROL Know your levels; limit fat & cholesterol in your diet  Lipid Panel     Component Value Date/Time   CHOL 118 10/09/2011 0843   TRIG 125 10/09/2011 0843   HDL 27* 10/09/2011 0843   CHOLHDL 4.4 10/09/2011 0843   VLDL 25 10/09/2011 0843   LDLCALC 66 10/09/2011 0843      Many patients benefit from treatment even if their cholesterol is at goal.  Goal: Total Cholesterol (CHOL) less than 160  Goal:  Triglycerides (TRIG) less than 150  Goal:  HDL greater than 40  Goal:  LDL (LDLCALC) less than 100   BLOOD PRESSURE American Stroke Association blood pressure target is less that 120/80 mm/Hg  Your discharge blood pressure is:  BP: 130/68 mmHg  Monitor your blood pressure  Limit your salt and alcohol intake  Many individuals will require more than one medication for high blood pressure  DIABETES (A1c is a blood sugar average for last 3 months) Goal HGBA1c is under 7% (HBGA1c is blood sugar average for last 3 months)  Diabetes: {STROKE DC DIABETES:22357}    Lab Results  Component Value Date   HGBA1C 6.1* 10/09/2011     Your HGBA1c can be lowered with medications, healthy diet, and exercise.  Check your blood sugar as directed by your physician  Call your physician if you experience unexplained or low blood sugars.  PHYSICAL ACTIVITY/REHABILITATION Goal is 30 minutes at least 4 days per week    {STROKE DC ACTIVITY/REHAB:22359}  Activity decreases your risk of heart attack and stroke and makes your heart stronger.   It helps control your weight and blood pressure; helps you relax and can improve your mood.  Participate in a regular exercise program.  Talk with your doctor about the best form of exercise for you (dancing, walking, swimming, cycling).  DIET/WEIGHT Goal is to maintain a healthy weight  Your discharge diet is: Cardiac *** liquids Your height is:  Height: 5\' 11"  (180.3 cm) Your current weight is: Weight: 90.4 kg (199 lb 4.7 oz) Your Body Mass Index (BMI) is:     Following the type of diet specifically designed for you will help prevent another stroke.  Your goal weight range is:  ***  Your goal Body Mass Index (BMI) is 19-24.  Healthy food habits can help reduce 3 risk factors for stroke:  High cholesterol, hypertension, and excess weight.  RESOURCES Stroke/Support Group:  Call [phone]  they meet the 3rd Sunday of the month on the Rehab Unit at [facility], [place] ( no meetings June, July & Aug).  STROKE EDUCATION PROVIDED/REVIEWED AND GIVEN TO PATIENT Stroke warning signs and symptoms How to activate emergency medical system (call 911). Medications prescribed at discharge. Need for follow-up after discharge. Personal risk factors for stroke. Pneumonia vaccine given:   {STROKE DC YES/NO/DATE:22363} Flu vaccine given:   {STROKE DC YES/NO/DATE:22363} My questions have been answered, the writing is legible, and I understand these instructions.  I will adhere to these goals & educational materials that have been provided  to me after my discharge from the hospital.

## 2011-10-12 NOTE — Progress Notes (Signed)
ANTICOAGULATION CONSULT NOTE - Follow Up Consult   Pharmacy Consult for Coumadin Indication: atrial fibrillation  No Known Allergies  Patient Measurements: Height: 5\' 11"  (180.3 cm) Weight: 199 lb 4.7 oz (90.4 kg) IBW/kg (Calculated) : 75.3    Vital Signs: Temp: 97.6 F (36.4 C) (04/12 0551) Temp src: Oral (04/12 0551) BP: 130/75 mmHg (04/12 0551) Pulse Rate: 83  (04/12 0551)  Labs:  Basename 10/12/11 0530 10/11/11 0700 10/10/11 1228  HGB -- -- --  HCT -- -- --  PLT -- -- --  APTT -- -- --  LABPROT 30.0* 26.0* 20.4*  INR 2.81* 2.34* 1.71*  HEPARINUNFRC -- -- --  CREATININE -- -- --  CKTOTAL -- -- --  CKMB -- -- --  TROPONINI -- -- --   Estimated Creatinine Clearance: 77 ml/min (by C-G formula based on Cr of 0.85).  Medical History: Past Medical History  Diagnosis Date  . Hypertension   . Diabetes mellitus     Medications:  Coumadin 3mg  on Tues/Sat and 4.5mg  other days.  Last dose 10/07/11.  Assessment: 78 yo M admitted with AMS/confusion x 1 week and noted with CVA.  Pt on Coumadin PTA for hx AFib.  INR is now therapeutic after three doses of 6mg  and 1 dose of 4.5mg .  Home dose resumed by MD for DC home with INR check on 4/16 called to [facility].  Goal of Therapy:  INR 2-3   Plan:  Coumadin 3mg  on Tues/Sat and 4.5mg  other days. INR ordered for 4/16 to be called to [facility]. Today is day 3/21 of total antiretroviral tx. Herby Abraham, Pharm.D. [phone] 10/12/2011 1:09 PM

## 2011-10-12 NOTE — Progress Notes (Signed)
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 aspirin EC  81 mg Oral Daily  . enalapril  20 mg Oral Daily  . furosemide  20 mg Oral Daily  . gabapentin  300 mg Oral BID  . insulin aspart  0-9 Units Subcutaneous TID WC  . niacin  2,000 mg Oral QHS  . senna-docusate  2 tablet Oral QHS  . simvastatin  20 mg Oral q1800  . sodium chloride  3 mL Intravenous Q12H  . valACYclovir  1,000 mg Oral TID  . warfarin  4.5 mg Oral ONCE-1800  . Warfarin - Pharmacist Dosing Inpatient   Does not apply q1800  . DISCONTD: acyclovir  750 mg Intravenous Q8H   Continuous Infusions:  PRN Meds:.acetaminophen, acetaminophen, ondansetron (ZOFRAN) IV, ondansetron, oxyCODONE-acetaminophen, polyethylene glycol, senna-docusate   Objective: Weight change:   Intake/Output Summary (Last 24 hours) at 10/12/11 1106 Last data filed at 10/11/11 1914  Gross per 24 hour  Intake    480 ml  Output      0 ml  Net    480 ml   Blood pressure 130/75, pulse 83, temperature 97.6 F (36.4 C), temperature source Oral, resp. rate 19, height 5\' 11"  (1.803 m), weight 199 lb 4.7 oz (90.4 kg), SpO2  95.00%. Temp:  [97.3 F (36.3 C)-97.9 F (36.6 C)] 97.6 F (36.4 C) (04/12 0551) Pulse Rate:  [80-84] 83  (04/12 0551) Resp:  [18-19] 19  (04/12 0551) BP: (126-169)/(72-78) 130/75 mmHg (04/12 0551) SpO2:  [94 %-98 %] 95 % (04/12 0551)  Physical Exam: General: Alert and awake,  HEENT: anicteric sclera, pupils reactive to light and accommodation, EOMI, oropharynx clear and without exudate  CVS regular rate, normal r, no murmur rubs or gallops  Chest: clear to auscultation bilaterally, no wheezing, rales or rhonchi  Abdomen: soft nontender, nondistended, normal bowel sounds,  Extremities: no clubbing or edema noted bilaterally  Skin: scattered flat darkly pigmented areas on back, not much is way of vesicles present and only on back not extending into the arm of c5 or c6 dermatome. He has no underlying pain  Neuro: other than his confusion nonfocal, strength and sensation intact   Lab Results: No results found for this basename: WBC:2,HGB:2,HCT:2,PLT:2 in the last 72 hours  BMET No results found for this basename: NA:2,K:2,CL:2,CO2:2,GLUCOSE:2,BUN:2,CREATININE:2,CALCIUM:2 in the last 72 hours  Micro Results: No results found for this  or any previous visit (from the past 240 hour(s)).  Studies/Results: No results found.    Assessment/Plan: Harold Jones is a 78 y.o. 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 HIV and RPR NR  I will sign  off      LOS: 4 days   Acey Lav 10/12/2011, 11:06 AM

## 2011-10-12 NOTE — Discharge Summary (Signed)
Patient ID: Harold Jones MRN: [id number] DOB/AGE: 02-02-1928 78 y.o.  Admit date: 10/08/2011 Discharge date: 10/12/2011  Primary Care Physician:  Julieanne Manson, MD, MD  Discharge Diagnoses:    Present on Admission:  .Altered mental status  Acute ischemic stroke  Zoster  Active Problems:  HYPERTENSION  CORONARY ARTERY DISEASE  FIBRILLATION, ATRIAL     Medication List  As of 10/12/2011 10:44 AM   TAKE these medications         aspirin EC 81 MG tablet   Take 81 mg by mouth daily.      enalapril 20 MG tablet   Commonly known as: VASOTEC   Take 20 mg by mouth daily.      furosemide 20 MG tablet   Commonly known as: LASIX   Take 1 tablet (20 mg total) by mouth daily.      gabapentin 300 MG capsule   Commonly known as: NEURONTIN   Take 1 capsule (300 mg total) by mouth 2 (two) times daily.      metFORMIN 500 MG tablet   Commonly known as: GLUCOPHAGE   Take 500 mg by mouth 2 (two) times daily with a meal.      metoprolol succinate 100 MG 24 hr tablet   Commonly known as: TOPROL-XL   Take 1 tablet (100 mg total) by mouth daily. Take with or immediately following a meal.      niacin 1000 MG CR tablet   Commonly known as: NIASPAN   Take 2,000 mg by mouth at bedtime.      oxyCODONE-acetaminophen 5-325 MG per tablet   Commonly known as: PERCOCET   Take 1 tablet by mouth every 6 (six) hours as needed.      pravastatin 40 MG tablet   Commonly known as: PRAVACHOL   Take 40 mg by mouth daily.      valACYclovir 1000 MG tablet   Commonly known as: VALTREX   Take 1 tablet (1,000 mg total) by mouth 3 (three) times daily.      warfarin 3 MG tablet   Commonly known as: COUMADIN   Take 3-4.5 mg by mouth daily. Take 1 tablet on tues and sat, take 1.5 tablet all other days            Consults:   Brief H and P: From the admission note:  78 years old bosnian man (non english speaking) with history of diabetes, hypertension, atrial fibrillation and other  comorbidities as outlined below, he was brought into the hospital because of altered mental status for the last 6-7 days. As per family patient does not have history of dementia however for the last week he had bizarre behavior, visual and auditory hallucinations and aggressive behavior sometimes. There was no reported fever, chills, nausea or vomiting, no change in his bowel habits, no dysuria or any other complaints. In the ED CT head showed no acute findings, labs showed no significant abnormalities.  1.  Altered Mental Status. Of uncertain etiology.  The patient appeared to be emotional with some mood swings (crying to happy), but no apparent psychosis or delusions (although the language barrier made this some what difficult to assess.  When the interpreter was utilized and when family visited we were able to verify that he was at his mental status base line. RBC folate and B12 were within normal limits, and RPR was non reactive.   MRI of the head showed two acute punctate infarcts.  Differential for the altered mental status includes:  Stroke,  Early Dementia, Mental illness with psychosis, or possibly Zoster Encephalitis.  At this point the patient is no longer having delusions or altered mental status- and is now back to baseline. Given the possibility of Zoster Encephalitis-he was briefly placed on IV Acyclovir, however given atypical nature of the zoster eruption and his rapid clinical improvement, ID was consulted. Dr Daiva Eves, yesterday did suggest stopping the IV Acyclovir and transitioning to Valtrex, and to treat for a total of 21 days. He was observed overnight on Valtrex, he was afebrile and mentation was stable, and was felt stable to be discharged home.  2.  Acute Strokes. MRI of the brain showed punctate nonacute hemorrhagic infarct involving the posterior left occipital lobe and possible second punctate acute nonhemorrhagic infarct over the high right frontal lobe. Also showed extensive white  matter disease and remote infarcts.  These findings were discussed with the neurology PA who recommended the patient be continued on Coumadin and that he have outpatient followup with Dr. Pearlean Brownie.  The patient has been evaluated by physical therapy and occupational therapy and has met all of his goals.  He has been restarted on his coumadin.  INR will be checked by home health RN on Tuesday 4/16 and called to [facility] MD for further coumadin dosing.  3.  Rash.  Possibly Zoster.  The patient presented in the ED with a bright red rash on his back in C6 dermatome.  Rash was very tender to palpation.  The patient was placed on appropriate contact and airborne precautions and treatment was started with IV Acyclovir (for possible zoster with consideration of zoster encephalitis).  The rash improved quickly and the patient was taken off precautions and started on PO Valtrex on 4/11.  He did well overnight and will be discharged on PO Valtrex for an additional 18 days (Total of 21 days of therapy).  4.  Hypertension.  The patient's blood pressure medications were adjusted. His metoprolol was held during his hospitalization. It will be restarted at discharge. His Lasix was cut in half and will be dosed at 20 mg per day at the time of discharge. During his hospitalization his blood pressures were allowed to run permissively high because of his acute strokes. His systolic BP was typically in the 150s to 160s.  We will ask that his primary care physician follow his blood pressure and adjust his medications accordingly.  The patient's other comorbidities including coronary artery disease and atrial fibrillation were quiet and remained stable during this hospitalization. The patient remains on Coumadin and an 81 mg aspirin.  Physical Exam on Discharge: General: Lying comfortably in bed. Smiling.  HOH  Lungs: Clear to auscultation bilaterally without wheezes or crackles  Cardiovascular: Regular rate and rhythm without  murmur gallop or rub normal S1 and S2  Abdomen: Nontender, nondistended, soft, bowel sounds positive, no rebound, no ascites, no appreciable mass  Neuro - non-focal.  Skin - Now resolving, less red, painful to palpation. On top of back approximately C6 distribution. Rash appears less erythematous today. less swelling. No obvious pustules.   Filed Vitals:   10/11/11 0527 10/11/11 1406 10/11/11 2134 10/12/11 0551  BP: 155/83 169/78 126/72 130/75  Pulse: 100 84 80 83  Temp: 98 F (36.7 C) 97.9 F (36.6 C) 97.3 F (36.3 C) 97.6 F (36.4 C)  TempSrc: Oral  Axillary Oral  Resp: 18 19 18 19   Height:      Weight:      SpO2: 95% 98% 94% 95%  Intake/Output Summary (Last 24 hours) at 10/12/11 1044 Last data filed at 10/11/11 1914  Gross per 24 hour  Intake    480 ml  Output      0 ml  Net    480 ml    Basic Metabolic Panel:  Lab 10/08/11 4098  NA 137  K 4.2  CL 103  CO2 24  GLUCOSE 179*  BUN 22  CREATININE 0.85  CALCIUM 9.5  MG --  PHOS --   Liver Function Tests:  Lab 10/08/11 1309  AST 24  ALT 18  ALKPHOS 40  BILITOT 0.6  PROT 6.9  ALBUMIN 3.5   CBC:  Lab 10/08/11 1309  WBC 4.1  NEUTROABS 1.7  HGB 14.6  HCT 40.5  MCV 88.4  PLT 122*   Cardiac Enzymes:  Lab 10/08/11 2107 10/08/11 1309  CKTOTAL 55 --  CKMB 2.2 --  CKMBINDEX -- --  TROPONINI <0.30 <0.30   CBG:  Lab 10/12/11 0802 10/11/11 2144 10/11/11 1720 10/11/11 1221 10/11/11 0821 10/10/11 2202  GLUCAP 179* 262* 222* 197* 179* 182*   Hemoglobin A1C:  Lab 10/09/11 0843  HGBA1C 6.1*   Fasting Lipid Panel:  Lab 10/09/11 0843  CHOL 118  HDL 27*  LDLCALC 66  TRIG 119  CHOLHDL 4.4  LDLDIRECT --   Thyroid Function Tests:  Lab 10/09/11 0843  TSH 0.454  T4TOTAL --  FREET4 --  T3FREE --  THYROIDAB --   He and her AST and  Lab 10/12/11 0530 10/11/11 0700 10/10/11 1228 10/09/11 0843  LABPROT 30.0* 26.0* 20.4* 19.3*  INR 2.81* 2.34* 1.71* 1.60*   Anemia Panel:  Lab 10/09/11 0843   VITAMINB12 238  FOLATE --  FERRITIN --  TIBC --  IRON --  RETICCTPCT --      Pertinent Lab Results:  RPR Non- reactive, HIV non-reactive.  Significant Diagnostic Studies:   2D Echo Study Conclusions  - Left ventricle: The cavity size was normal. There was moderate concentric hypertrophy. Systolic function was normal. The estimated ejection fraction was in the range of 55% to 60%. - Aortic valve: Presumed tissue AVR Not well seen Mild perprosthetic regurgitation and mildly elevated systolic gradient - Left atrium: The atrium was mildly dilated. - Atrial septum: No defect or patent foramen ovale was identified. - Pulmonary arteries: PA peak pressure: 37mm Hg (S).  Dg Chest 2 View  10/08/2011  *RADIOLOGY REPORT*  Clinical Data: Altered mental status.  Weakness.  CHEST - 2 VIEW  Comparison: 09/09/2007  Findings: Prior median sternotomy.  Mild cardiomegaly.  Tortuous thoracic aorta.  Aortic valve replacement.  No pleural effusion or pneumothorax.  Diffuse peribronchial thickening.  Calcified granulomas bilaterally.  Mild volume loss at the right lung base.  IMPRESSION: Cardiomegaly and chronic interstitial thickening. No acute superimposed process.  Original Report Authenticated By: Consuello Bossier, M.D.   Ct Head Wo Contrast  10/08/2011  *RADIOLOGY REPORT*  Clinical Data: Confusion, psychosis  CT HEAD WITHOUT CONTRAST  Technique:  Contiguous axial images were obtained from the base of the skull through the vertex without contrast.  Comparison: None.  Findings: Brain atrophy evident with microvascular ischemic changes throughout the cerebral white matter bilaterally.  Remote infarcts in the right frontal white matter and the left cerebral hemisphere. No acute intracranial hemorrhage, focal edema, mass lesion, definite acute infarction midline shift, herniation, hydrocephalus, or extra-axial fluid collection.  Gray-white matter differentiation maintained.  Cisterns patent.  Cerebellar atrophy as  well. Symmetric orbits.  Mastoids and sinuses clear.  IMPRESSION: Atrophy, microvascular ischemic changes and remote infarcts.  No acute process by noncontrast CT.  Original Report Authenticated By: Judie Petit. Ruel Favors, M.D.   Mr Brain Wo Contrast  10/08/2011  *RADIOLOGY REPORT*  Clinical Data: Altered mental status.  Confusion.  Hallucinations.  MRI HEAD WITHOUT CONTRAST  Technique:  Multiplanar, multiecho pulse sequences of the brain and surrounding structures were obtained according to standard protocol without intravenous contrast.  Comparison: CT head without contrast 10/08/2011.  Findings: The diffusion weighted images demonstrate a punctate area of restricted diffusion within the left occipital lobe.  There is a much more subtle punctate area over the posterior right frontal lobe.  The second lesion may be T2 shine through.  Moderate generalized atrophy is present.  Remote white matter infarcts are noted within the right corona radiata.  Moderate periventricular and subcortical T2 and FLAIR hyperintensity is present bilaterally. A remote cortical infarct is noted in the right frontal lobe as well.  Flow is present in the major intracranial arteries.  The patient is status post bilateral lens extractions.  Remote bilateral cerebellar infarcts are evident.  No hemorrhage or mass lesion is present.  The paranasal sinuses and mastoid air cells are clear.  IMPRESSION:  1.  Punctate acute non hemorrhagic infarct involving the posterior left occipital lobe. 2.  Possible second punctate acute non hemorrhagic infarct over the high right frontal lobe. 3.  Remote infarcts of the high right frontal lobe and cerebellum. 4.  Atrophy extensive white matter disease.  This likely reflects the sequelae of chronic microvascular ischemia.  Original Report Authenticated By: Jamesetta Orleans. MATTERN, M.D.      Disposition and Follow-up: Stable for discharge to home.  Discharge Orders    Future Orders Please Complete By Expires    Diet - low sodium heart healthy      Increase activity slowly      Discharge instructions      Comments:   INR check (coumadin level). Check Blood Pressure  Follow Rash on back and Right Arm.(possible Zoster being treated with Valtrex.)     Follow-up Information    Follow up with Gates Rigg, MD. Schedule an appointment as soon as possible for a visit in 2 months.   Contact information:   [address], [address] [facility] [place] [address] [phone]       Fand willollow up with Eustace Moore, MD on 11/19/2011. (at 2:45)    Contact information:   [address] [place] [address] [phone]    INR to be checked Tuesday 4/16 by home health RN and called in to [facility]       Time spent on Discharge: 40 min.  SignedStephani Police 10/12/2011, 10:44 AM [phone]  Attending - I have seen and examined this patient, i agree with the assessment and plan. He continues to show clinical improvement, he will be discharged home today. Patient is infact very anxious to go today as well.Yesterday I had a long discussion with patient's grand-daughter and daughter, they claimed that patient's mentation was significantly better, and was back to usual baseline. I kept the patient overnight, to make sure that he continues to do well on oral valtrex.  Dr Windell Norfolk

## 2011-10-12 NOTE — Progress Notes (Signed)
Inpatient Diabetes Program Recommendations  AACE/ADA: New Consensus Statement on Inpatient Glycemic Control (2009)  Target Ranges:  Prepandial:   less than 140 mg/dL      Peak postprandial:   less than 180 mg/dL (1-2 hours)      Critically ill patients:  140 - 180 mg/dL  Results for Harold Jones, Harold Jones (MRN [id number]) as of 10/12/2011 10:31  Ref. Range 10/11/2011 08:21 10/11/2011 12:21 10/11/2011 17:20 10/11/2011 21:44 10/12/2011 08:02  Glucose-Capillary Latest Range: 70-99 mg/dL 409 (H) 811 (H) 914 (H) 262 (H) 179 (H)   Reason for Visit:   Inpatient Diabetes Program Recommendations Correction (SSI): Increase correction Novolog to moderate tid with meals and HS. Diet: Please add CHO modified to "Heart healthy" diet  Note: will follow.

## 2011-10-12 NOTE — Progress Notes (Signed)
Utilization review completed.  

## 2011-10-12 NOTE — Progress Notes (Signed)
Patient being discharged home today. IV site removed and discharged instructions reviewed with granddaughter, Valeda Malm.

## 2011-10-12 NOTE — Progress Notes (Signed)
   CARE MANAGEMENT NOTE 10/12/2011  Patient:  Harold Jones, Harold Jones   Account Number:  [id number]  Date Initiated:  10/09/2011  Documentation initiated by:  Letha Cape  Subjective/Objective Assessment:   dx ams, cva  admit- lives with spouse.     Action/Plan:   pt eval- pt at baseline   Anticipated DC Date:  10/12/2011   Anticipated DC Plan:  HOME W HOME HEALTH SERVICES      DC Planning Services  CM consult  Follow-up appt scheduled      Choice offered to / List presented to:  C-1 Patient        HH arranged  HH-1 RN      [facility] agency  [facility]   Status of service:  Completed, signed off Medicare Important Message given?   (If response is "NO", the following Medicare IM given date fields will be blank) Date Medicare IM given:   Date Additional Medicare IM given:    Discharge Disposition:  HOME W HOME HEALTH SERVICES  Per UR Regulation:    If discussed at Long Length of Stay Meetings, dates discussed:    Comments:  10/12/11  11:17 Letha Cape RN, BSN [phone] phsical therapy states patient is at baseline and has not pt/ot needs, I spoke with Ljerka at [phone] and she states yes they would like to cancel pt/ot , but would like to have [facility] to come draw inr on Tues and send results to Dr. Philipp Deputy at [facility], fax number is [phone].    Referral updated with [facility], patient for dc today and patient has f/u appt for 5/20 at 2:45 with Dr. Philipp Deputy as well.  10/10/11 12:36 Letha Cape RN, BSN patient for discharge today, pointed to [facility] on agency list patient shook his head yes,  referral made to [facility] for hhpt/ot, Lupita Leash notified.  Soc will begin 24-48 hrs post discharge.  10/09/11 14:24 Letha Cape RN, BSN [phone] patient lives with spouse, await pt eval.

## 2011-12-24 ENCOUNTER — Ambulatory Visit: Payer: Self-pay | Admitting: Cardiovascular Disease

## 2011-12-24 DIAGNOSIS — I4891 Unspecified atrial fibrillation: Secondary | ICD-10-CM

## 2011-12-24 DIAGNOSIS — I6350 Cerebral infarction due to unspecified occlusion or stenosis of unspecified cerebral artery: Secondary | ICD-10-CM

## 2011-12-24 DIAGNOSIS — I359 Nonrheumatic aortic valve disorder, unspecified: Secondary | ICD-10-CM

## 2011-12-24 DIAGNOSIS — Z954 Presence of other heart-valve replacement: Secondary | ICD-10-CM

## 2012-01-04 ENCOUNTER — Other Ambulatory Visit: Payer: Self-pay | Admitting: Cardiology

## 2012-02-10 ENCOUNTER — Other Ambulatory Visit: Payer: Self-pay | Admitting: Cardiology

## 2012-06-13 ENCOUNTER — Encounter (INDEPENDENT_AMBULATORY_CARE_PROVIDER_SITE_OTHER): Payer: Medicare Other | Admitting: Ophthalmology

## 2012-06-18 ENCOUNTER — Encounter (INDEPENDENT_AMBULATORY_CARE_PROVIDER_SITE_OTHER): Payer: Medicare Other | Admitting: Ophthalmology

## 2012-06-18 DIAGNOSIS — E11319 Type 2 diabetes mellitus with unspecified diabetic retinopathy without macular edema: Secondary | ICD-10-CM

## 2012-06-18 DIAGNOSIS — I10 Essential (primary) hypertension: Secondary | ICD-10-CM

## 2012-06-18 DIAGNOSIS — H27139 Posterior dislocation of lens, unspecified eye: Secondary | ICD-10-CM

## 2012-06-18 DIAGNOSIS — E1165 Type 2 diabetes mellitus with hyperglycemia: Secondary | ICD-10-CM

## 2012-06-18 DIAGNOSIS — H35039 Hypertensive retinopathy, unspecified eye: Secondary | ICD-10-CM

## 2012-06-18 DIAGNOSIS — H43819 Vitreous degeneration, unspecified eye: Secondary | ICD-10-CM

## 2012-07-16 ENCOUNTER — Encounter (INDEPENDENT_AMBULATORY_CARE_PROVIDER_SITE_OTHER): Payer: Medicare Other | Admitting: Ophthalmology

## 2012-07-16 DIAGNOSIS — H35039 Hypertensive retinopathy, unspecified eye: Secondary | ICD-10-CM

## 2012-07-16 DIAGNOSIS — H27139 Posterior dislocation of lens, unspecified eye: Secondary | ICD-10-CM

## 2012-07-16 DIAGNOSIS — I10 Essential (primary) hypertension: Secondary | ICD-10-CM

## 2012-07-16 DIAGNOSIS — H43819 Vitreous degeneration, unspecified eye: Secondary | ICD-10-CM

## 2012-07-16 DIAGNOSIS — E1139 Type 2 diabetes mellitus with other diabetic ophthalmic complication: Secondary | ICD-10-CM

## 2012-07-16 DIAGNOSIS — H3530 Unspecified macular degeneration: Secondary | ICD-10-CM

## 2012-07-16 DIAGNOSIS — H2700 Aphakia, unspecified eye: Secondary | ICD-10-CM

## 2012-07-16 DIAGNOSIS — E11319 Type 2 diabetes mellitus with unspecified diabetic retinopathy without macular edema: Secondary | ICD-10-CM

## 2012-07-24 ENCOUNTER — Encounter (HOSPITAL_COMMUNITY): Admission: RE | Payer: Self-pay | Source: Ambulatory Visit

## 2012-07-24 ENCOUNTER — Ambulatory Visit (HOSPITAL_COMMUNITY): Admission: RE | Admit: 2012-07-24 | Payer: Medicare Other | Source: Ambulatory Visit | Admitting: Ophthalmology

## 2012-07-24 SURGERY — PARS PLANA VITRECTOMY WITH 25G REMOVAL/SUTURE INTRAOCULAR LENS
Anesthesia: General | Laterality: Right

## 2013-06-23 ENCOUNTER — Inpatient Hospital Stay (HOSPITAL_COMMUNITY)
Admission: EM | Admit: 2013-06-23 | Discharge: 2013-06-25 | DRG: 637 | Disposition: A | Discharge status: Home / Self Care | Payer: Medicare Other | Attending: Internal Medicine | Admitting: Internal Medicine

## 2013-06-23 ENCOUNTER — Encounter (HOSPITAL_COMMUNITY): Payer: Self-pay | Admitting: Emergency Medicine

## 2013-06-23 ENCOUNTER — Emergency Department (HOSPITAL_COMMUNITY): Payer: Medicare Other

## 2013-06-23 DIAGNOSIS — E1169 Type 2 diabetes mellitus with other specified complication: Principal | ICD-10-CM | POA: Diagnosis present

## 2013-06-23 DIAGNOSIS — E162 Hypoglycemia, unspecified: Secondary | ICD-10-CM | POA: Diagnosis present

## 2013-06-23 DIAGNOSIS — I639 Cerebral infarction, unspecified: Secondary | ICD-10-CM

## 2013-06-23 DIAGNOSIS — E119 Type 2 diabetes mellitus without complications: Secondary | ICD-10-CM

## 2013-06-23 DIAGNOSIS — R4182 Altered mental status, unspecified: Secondary | ICD-10-CM

## 2013-06-23 DIAGNOSIS — G9340 Encephalopathy, unspecified: Secondary | ICD-10-CM | POA: Diagnosis present

## 2013-06-23 DIAGNOSIS — R791 Abnormal coagulation profile: Secondary | ICD-10-CM | POA: Diagnosis present

## 2013-06-23 DIAGNOSIS — I10 Essential (primary) hypertension: Secondary | ICD-10-CM | POA: Diagnosis present

## 2013-06-23 DIAGNOSIS — I2510 Atherosclerotic heart disease of native coronary artery without angina pectoris: Secondary | ICD-10-CM | POA: Diagnosis present

## 2013-06-23 DIAGNOSIS — Z7982 Long term (current) use of aspirin: Secondary | ICD-10-CM

## 2013-06-23 DIAGNOSIS — I498 Other specified cardiac arrhythmias: Secondary | ICD-10-CM | POA: Diagnosis present

## 2013-06-23 DIAGNOSIS — E785 Hyperlipidemia, unspecified: Secondary | ICD-10-CM | POA: Diagnosis present

## 2013-06-23 DIAGNOSIS — S0003XA Contusion of scalp, initial encounter: Secondary | ICD-10-CM | POA: Diagnosis present

## 2013-06-23 DIAGNOSIS — Z79899 Other long term (current) drug therapy: Secondary | ICD-10-CM

## 2013-06-23 DIAGNOSIS — W19XXXA Unspecified fall, initial encounter: Secondary | ICD-10-CM | POA: Diagnosis present

## 2013-06-23 DIAGNOSIS — Z794 Long term (current) use of insulin: Secondary | ICD-10-CM

## 2013-06-23 DIAGNOSIS — I4891 Unspecified atrial fibrillation: Secondary | ICD-10-CM | POA: Diagnosis present

## 2013-06-23 DIAGNOSIS — Z7901 Long term (current) use of anticoagulants: Secondary | ICD-10-CM

## 2013-06-23 LAB — COMPREHENSIVE METABOLIC PANEL
AST: 40 U/L — ABNORMAL HIGH (ref 0–37)
CO2: 24 mEq/L (ref 19–32)
Calcium: 9.2 mg/dL (ref 8.4–10.5)
Creatinine, Ser: 0.66 mg/dL (ref 0.50–1.35)
GFR calc non Af Amer: 87 mL/min — ABNORMAL LOW (ref >90)
Potassium: 4.4 mEq/L (ref 3.5–5.1)
Total Bilirubin: 0.8 mg/dL (ref 0.3–1.2)

## 2013-06-23 LAB — CBC WITH DIFFERENTIAL/PLATELET
Basophils Absolute: 0 10*3/uL (ref 0.0–0.1)
Basophils Relative: 0 % (ref 0–1)
Eosinophils Absolute: 0 10*3/uL (ref 0.0–0.7)
Eosinophils Relative: 0 % (ref 0–5)
HCT: 39.7 % (ref 39.0–52.0)
MCH: 32.1 pg (ref 26.0–34.0)
MCHC: 35.3 g/dL (ref 30.0–36.0)
MCV: 91.1 fL (ref 78.0–100.0)
Monocytes Absolute: 0.4 10*3/uL (ref 0.1–1.0)
RDW: 13.4 % (ref 11.5–15.5)

## 2013-06-23 LAB — URINALYSIS, ROUTINE W REFLEX MICROSCOPIC
Ketones, ur: NEGATIVE mg/dL
Leukocytes, UA: NEGATIVE
Nitrite: NEGATIVE
Protein, ur: >300 mg/dL — AB
Specific Gravity, Urine: 1.02 (ref 1.005–1.030)
Urobilinogen, UA: 1 mg/dL (ref 0.0–1.0)

## 2013-06-23 LAB — TROPONIN I: Troponin I: <0.3 ng/mL (ref <0.30)

## 2013-06-23 LAB — GLUCOSE, CAPILLARY
Glucose-Capillary: 59 mg/dL — ABNORMAL LOW (ref 70–99)
Glucose-Capillary: 97 mg/dL (ref 70–99)

## 2013-06-23 LAB — AMMONIA: Ammonia: 45 umol/L (ref 11–60)

## 2013-06-23 LAB — URINE MICROSCOPIC-ADD ON

## 2013-06-23 LAB — PROTIME-INR: Prothrombin Time: 41.3 seconds — ABNORMAL HIGH (ref 11.6–15.2)

## 2013-06-23 MED ORDER — HYDRALAZINE HCL 20 MG/ML IJ SOLN
10.0000 mg | Freq: Four times a day (QID) | INTRAMUSCULAR | Status: DC | PRN
  Administered 2013-06-25: 10 mg via INTRAVENOUS
  Filled 2013-06-23: qty 1

## 2013-06-23 MED ORDER — ACETAMINOPHEN 650 MG RE SUPP: 650.0000 mg | Freq: Four times a day (QID) | RECTAL | Status: DC | PRN

## 2013-06-23 MED ORDER — METOPROLOL TARTRATE 100 MG PO TABS
100.0000 mg | ORAL_TABLET | Freq: Every day | ORAL | Status: DC
  Administered 2013-06-23: 100 mg via ORAL
  Filled 2013-06-23: qty 1

## 2013-06-23 MED ORDER — MORPHINE SULFATE 2 MG/ML IJ SOLN: 2.0000 mg | INTRAMUSCULAR | Status: DC | PRN

## 2013-06-23 MED ORDER — ACETAMINOPHEN 325 MG PO TABS
650.0000 mg | ORAL_TABLET | Freq: Four times a day (QID) | ORAL | Status: DC | PRN
Start: 2013-06-23 — End: 2013-06-25

## 2013-06-23 MED ORDER — SODIUM CHLORIDE 0.9 % IJ SOLN
3.0000 mL | Freq: Two times a day (BID) | INTRAMUSCULAR | Status: DC
  Administered 2013-06-24 – 2013-06-25 (×3): 3 mL via INTRAVENOUS

## 2013-06-23 MED ORDER — FUROSEMIDE 20 MG PO TABS
20.0000 mg | ORAL_TABLET | Freq: Every day | ORAL | Status: DC
  Administered 2013-06-23 – 2013-06-24 (×2): 20 mg via ORAL
  Filled 2013-06-23 (×2): qty 1

## 2013-06-23 MED ORDER — ACETAMINOPHEN 325 MG PO TABS
650.0000 mg | ORAL_TABLET | Freq: Once | ORAL | Status: AC
  Administered 2013-06-23: 650 mg via ORAL
  Filled 2013-06-23: qty 2

## 2013-06-23 MED ORDER — GABAPENTIN 300 MG PO CAPS
300.0000 mg | ORAL_CAPSULE | Freq: Two times a day (BID) | ORAL | Status: DC
  Administered 2013-06-23 – 2013-06-25 (×4): 300 mg via ORAL
  Filled 2013-06-23 (×5): qty 1

## 2013-06-23 MED ORDER — SIMVASTATIN 20 MG PO TABS
20.0000 mg | ORAL_TABLET | Freq: Every day | ORAL | Status: DC
  Administered 2013-06-24 – 2013-06-25 (×2): 20 mg via ORAL
  Filled 2013-06-23 (×2): qty 1

## 2013-06-23 MED ORDER — DONEPEZIL HCL 5 MG PO TABS
5.0000 mg | ORAL_TABLET | Freq: Every day | ORAL | Status: DC
  Administered 2013-06-23: 5 mg via ORAL
  Filled 2013-06-23 (×2): qty 1

## 2013-06-23 MED ORDER — ONDANSETRON HCL 4 MG/2ML IJ SOLN
INTRAMUSCULAR | Status: AC
  Administered 2013-06-23: 4 mg via INTRAVENOUS
  Filled 2013-06-23: qty 2

## 2013-06-23 MED ORDER — SODIUM CHLORIDE 0.9 % IV SOLN
INTRAVENOUS | Status: AC
  Administered 2013-06-23: 19:00:00 via INTRAVENOUS

## 2013-06-23 MED ORDER — INSULIN ASPART 100 UNIT/ML ~~LOC~~ SOLN
0.0000 [IU] | SUBCUTANEOUS | Status: DC
  Administered 2013-06-23: 2 [IU] via SUBCUTANEOUS

## 2013-06-23 MED ORDER — HYDROCODONE-ACETAMINOPHEN 5-325 MG PO TABS: 1.0000 | ORAL_TABLET | ORAL | Status: DC | PRN

## 2013-06-23 MED ORDER — ONDANSETRON HCL 4 MG/2ML IJ SOLN
4.0000 mg | Freq: Once | INTRAMUSCULAR | Status: AC
  Administered 2013-06-23: 4 mg via INTRAVENOUS

## 2013-06-23 MED ORDER — ENALAPRIL MALEATE 20 MG PO TABS
20.0000 mg | ORAL_TABLET | Freq: Every day | ORAL | Status: DC
  Administered 2013-06-23: 20 mg via ORAL
  Filled 2013-06-23 (×2): qty 1

## 2013-06-23 NOTE — ED Notes (Signed)
Pt given orange juice, graham crackers and peanut butter, CBG 59. Dr. Loretha Stapler made aware, will recheck CBG in 30 minutes

## 2013-06-23 NOTE — ED Provider Notes (Signed)
CSN: [id number]     Arrival date & time 06/23/13  1118 History   First MD Initiated Contact with Patient 06/23/13 1129     Chief Complaint  Patient presents with  . Fall   (Consider location/radiation/quality/duration/timing/severity/associated sxs/prior Treatment) Patient is a 79 y.o. male presenting with altered mental status.  Altered Mental Status Presenting symptoms: behavior changes, combativeness and confusion   Severity:  Moderate Episode number: seems to wax and wane.  worst at night. Timing:  Intermittent Progression:  Waxing and waning Chronicity:  New Context: head injury (fell yesterday)   Associated symptoms: no abdominal pain, no fever and no vomiting     Past Medical History  Diagnosis Date  . Hypertension   . Diabetes mellitus    Past Surgical History  Procedure Laterality Date  . Cardiac surgery     History reviewed. No pertinent family history. History  Substance Use Topics  . Smoking status: Never Smoker   . Smokeless tobacco: Never Used  . Alcohol Use:     Review of Systems  Constitutional: Negative for fever.  Gastrointestinal: Negative for vomiting and abdominal pain.  Psychiatric/Behavioral: Positive for confusion.  All other systems reviewed and are negative.  ROS obtained from family  Allergies  Review of patient's allergies indicates no known allergies.  Home Medications   Current Outpatient Rx  Name  Route  Sig  Dispense  Refill  . aspirin EC 81 MG tablet   Oral   Take 81 mg by mouth daily.         . enalapril (VASOTEC) 20 MG tablet   Oral   Take 20 mg by mouth daily.         Marland Kitchen EXPIRED: furosemide (LASIX) 20 MG tablet   Oral   Take 1 tablet (20 mg total) by mouth daily.   30 tablet   1   . EXPIRED: gabapentin (NEURONTIN) 300 MG capsule   Oral   Take 1 capsule (300 mg total) by mouth 2 (two) times daily.   60 capsule   0   . metFORMIN (GLUCOPHAGE) 500 MG tablet   Oral   Take 500 mg by mouth 2 (two) times daily  with a meal.         . niacin (NIASPAN) 1000 MG CR tablet   Oral   Take 2,000 mg by mouth at bedtime.         . pravastatin (PRAVACHOL) 40 MG tablet   Oral   Take 40 mg by mouth daily.         Marland Kitchen warfarin (COUMADIN) 3 MG tablet   Oral   Take 3-4.5 mg by mouth daily. Take 1 tablet on tues and sat, take 1.5 tablet all other days          BP 211/102  Pulse 71  SpO2 96% Physical Exam  Nursing note and vitals reviewed. Constitutional: He is oriented to person, place, and time. He appears well-developed and well-nourished. No distress.  HENT:  Head: Normocephalic and atraumatic.    Mouth/Throat: Oropharynx is clear and moist.  Eyes: Conjunctivae are normal. No scleral icterus.  left pupil is oblong  Neck: Neck supple.  Cardiovascular: Normal rate and intact distal pulses.  An irregularly irregular rhythm present.  Murmur heard.  Systolic murmur is present with a grade of 3/6  Pulmonary/Chest: Effort normal and breath sounds normal. No stridor. No respiratory distress. He has no wheezes. He has no rales.  Abdominal: Soft. He exhibits no distension. There is no  tenderness.  Musculoskeletal: Normal range of motion. He exhibits no edema.  Neurological: He is alert and oriented to person, place, and time. He has normal strength.  Skin: Skin is warm and dry. No rash noted.  Psychiatric: He has a normal mood and affect. His behavior is normal.    ED Course  Procedures (including critical care time) Labs Review Labs Reviewed  CBC WITH DIFFERENTIAL - Abnormal; Notable for the following:    Platelets 125 (*)    Neutrophils Relative % 80 (*)    All other components within normal limits  COMPREHENSIVE METABOLIC PANEL - Abnormal; Notable for the following:    Glucose, Bld 59 (*)    AST 40 (*)    GFR calc non Af Amer 87 (*)    All other components within normal limits  URINALYSIS, ROUTINE W REFLEX MICROSCOPIC - Abnormal; Notable for the following:    Hgb urine dipstick  MODERATE (*)    Protein, ur >300 (*)    All other components within normal limits  PROTIME-INR - Abnormal; Notable for the following:    Prothrombin Time 41.3 (*)    INR 4.55 (*)    All other components within normal limits  GLUCOSE, CAPILLARY - Abnormal; Notable for the following:    Glucose-Capillary 59 (*)    All other components within normal limits  TROPONIN I  URINE MICROSCOPIC-ADD ON  GLUCOSE, CAPILLARY   Imaging Review Ct Head Wo Contrast  06/23/2013   CLINICAL DATA:  79 year old male status post fall. Weakness, abrasion. On Coumadin. Initial encounter.  EXAM: CT HEAD WITHOUT CONTRAST  TECHNIQUE: Contiguous axial images were obtained from the base of the skull through the vertex without intravenous contrast.  COMPARISON:  Brain MRI 10/08/2011 and earlier.  FINDINGS: Visualized paranasal sinuses and mastoids are clear. Left forehead broad-based scalp hematoma measuring up to 5 mm in thickness. Left globe and orbits soft tissues appear stable and intact.  There appears to be artificial lens dislocation at the right globe (series 3, image 28), new from prior.  No acute osseous abnormality identified.  Calcified atherosclerosis at the skull base. Stable cerebral volume. No ventriculomegaly. No midline shift, mass effect, or evidence of intracranial mass lesion. Stable distal ICA dolichoectasia. No suspicious intracranial vascular hyperdensity.  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 Patchy white matter hypodensity elsewhere is stable. No evidence of cortically based acute infarction identified. No acute intracranial hemorrhage identified.  IMPRESSION: 1. Left scalp hematoma without underlying fracture.  2.  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  3. Stable non contrast CT appearance of the brain with chronic ischemic disease.   Electronically Signed   By: Augusto Gamble M.D.   On: 06/23/2013 13:10   Dg Chest Port 1 View  06/23/2013   CLINICAL  DATA:  Fall, weakness, confusion  EXAM: PORTABLE CHEST - 1 VIEW  COMPARISON:  10/08/2011  FINDINGS: Cardiomediastinal silhouette is stable. Status post median sternotomy and cardiac valve replacement. Chronic interstitial prominence again noted. No focal infiltrate or pulmonary edema. Stable left upper lobe calcified granuloma.  IMPRESSION: No active disease.  Chronic interstitial prominence again noted.   Electronically Signed   By: Natasha Mead M.D.   On: 06/23/2013 13:16  All radiology studies independently viewed by me.     EKG Interpretation    Date/Time:  Tuesday June 23 2013 11:26:19 EST Ventricular Rate:  65 PR Interval:    QRS Duration: 131 QT Interval:  435 QTC  Calculation: 452 R Axis:   -51 Text Interpretation:  Atrial fibrillation Left bundle branch block compared to last tracing, aberrantly conducted complexes no longer present Confirmed by [facility]  MD, TREY ([id number]) on 06/23/2013 12:27:46 PM            MDM   1. Encephalopathy acute   2. Altered mental status   3. Atrial fibrillation   4. Other and unspecified hyperlipidemia   5. Unspecified essential hypertension   6. Hypoglycemia   7. Supratherapeutic INR    79 yo male with intermittent episodes of aggressive behavior, confusion, and altered mental status.  He also fell yesterday, was evaluated by his PCP, told to dc coumadin, and was placed on blood pressure medication.  This morning, pt's blood sugar was low when EMS arrived, improved with oral glucose, but dropped again by time of arrival.  Of note, he administers his own Lantus every night and he does not ever check his blood sugars.  He is well appearing in the ED, calm and oriented.  His workup is notable for supratherapeutic INR, negative head CT, negative CXR, hematuria without signs of infection, unremarkable chemistries except for mild hypoglycemia.    His history seems most consistent with a delirium pattern.  This may be secondary to transient hypoglycemia  or another unidentified cause.  I expect that his current normal mental status will deteriorate again, and therefore he should be admitted to the hospital for further monitoring and testing.       Candyce Churn, MD 06/23/13 [phone]

## 2013-06-23 NOTE — ED Notes (Signed)
Pt arrives via ems for fall x2 days ago, pt fell from standing position, has abrasion noted to left eye. Pt has been on coumadin x15 years and doctor just stopped pt coumadin yesterday. Pt cbg was 27, fiven 25 mg of glucose paste and cbg came up to 85, en route cbg was 70 EMS gave another 25 mg of glucose paste.

## 2013-06-23 NOTE — H&P (Signed)
[facility] History and Physical  Okie Hawthorne ZOX:[id number] DOB: 1927-09-25 DOA: 06/23/2013  Referring physician:  PCP: Julieanne Manson, MD  Specialists:   Chief Complaint: Confusion   HPI: Harold Jones is a 79 y.o. male  With a history of hypertension, hyperlipidemia, diabetes mellitus type 2, atrial fibrillation on anticoagulation that presents emergency department for confusion and combativeness. Patient was found to have a blood sugar in the 20s per EMS. On arrival to the emergency department patient was also noted to have a low blood sugar. Patient does not speak [place], he is [place], his daughter was the Nurse, learning disability. It seems that over the last few days, patient has been more agitated and different not himself. He also had one episode of incontinence. Patient has not had any other complaints. He is not experiencing any abdominal pain nausea or vomiting or constipation diarrhea. He has not complained of any chest pain or shortness of breath. Patient did however experience a fall a few days ago and does have a hematoma noted on the left forehead. Patient recently went to see his primary care physician at which point, he was taken off of Coumadin and placed on Xarelto. Patient was recently diagnosed with hypertension, was placed on antihypertensive medications yesterday 06/22/2013. Of note, patient is back to his baseline at this time.  Review of Systems:  Constitutional: Denies fever, chills, diaphoresis, appetite change and fatigue.  HEENT: Denies photophobia, eye pain, redness, hearing loss, ear pain, congestion, sore throat, rhinorrhea, sneezing, mouth sores, trouble swallowing, neck pain, neck stiffness and tinnitus.   Respiratory: Denies SOB, DOE, cough, chest tightness,  and wheezing.   Cardiovascular: Denies chest pain, palpitations and leg swelling.  Gastrointestinal: Denies nausea, vomiting, abdominal pain, diarrhea, constipation, blood in stool and abdominal  distention.  Genitourinary: Denies dysuria, urgency, frequency, hematuria, flank pain and difficulty urinating.  one episode of urinary incontinence. Musculoskeletal: Denies myalgias, back pain, joint swelling, arthralgias and gait problem.  Skin: Denies pallor, rash and wound.  Neurological: Has had recent fall, altered mental status per family. Hematological: Denies adenopathy. Easy bruising, personal or family bleeding history  Psychiatric/Behavioral: Denies suicidal ideation, mood changes, confusion, nervousness, sleep disturbance and agitation  Past Medical History  Diagnosis Date  . Hypertension   . Diabetes mellitus    Past Surgical History  Procedure Laterality Date  . Cardiac surgery     Social History:  reports that he has never smoked. He has never used smokeless tobacco. His alcohol and drug histories are not on file. Patient lives at home with his wife and family.  No Known Allergies  History reviewed. No pertinent family history.   Prior to Admission medications   Medication Sig Start Date End Date Taking? Authorizing Provider  aspirin EC 81 MG tablet Take 81 mg by mouth daily.   Yes Historical Provider, MD  donepezil (ARICEPT) 5 MG tablet Take 5 mg by mouth at bedtime.   Yes Historical Provider, MD  enalapril (VASOTEC) 20 MG tablet Take 20 mg by mouth daily.   Yes Historical Provider, MD  furosemide (LASIX) 20 MG tablet Take 20 mg by mouth daily.   Yes Historical Provider, MD  insulin glargine (LANTUS) 100 UNIT/ML injection Inject 74 Units into the skin at bedtime.   Yes Historical Provider, MD  metFORMIN (GLUCOPHAGE) 500 MG tablet Take 500 mg by mouth 2 (two) times daily with a meal.   Yes Historical Provider, MD  metoprolol (LOPRESSOR) 100 MG tablet Take 100 mg by mouth daily.   Yes Historical  Provider, MD  pravastatin (PRAVACHOL) 40 MG tablet Take 40 mg by mouth daily.   Yes Historical Provider, MD  Rivaroxaban (XARELTO) 15 MG TABS tablet Take 15 mg by mouth daily.    Yes Historical Provider, MD  gabapentin (NEURONTIN) 300 MG capsule Take 1 capsule (300 mg total) by mouth 2 (two) times daily. 10/12/11 10/11/12  Stephani Police, PA-C   Physical Exam: Filed Vitals:   06/23/13 1500  BP: 173/81  Pulse: 72  Temp:   Resp: 18     General: Well developed, well nourished, NAD, appears stated age  HEENT: [place], hematoma noted on the left forehead, PERRLA, EOMI, Anicteic Sclera, mucous membranes moist. No pharyngeal erythema or exudates  Neck: Supple, no JVD, no masses  Cardiovascular: S1 S2 auscultated, irregularly irregular   Respiratory: Clear to auscultation bilaterally with equal chest rise  Abdomen: Soft, nontender, nondistended, + bowel sounds  Extremities: warm dry without cyanosis clubbing or edema  Neuro: AAOx3, cranial nerves grossly intact. Strength 5/5 in patient's upper and lower extremities bilaterally  Skin: Without rashes exudates or nodules  Psych: Normal affect and demeanor with intact judgement and insight  Labs on Admission:  Basic Metabolic Panel:  Recent Labs Lab 06/23/13 1230  NA 144  K 4.4  CL 106  CO2 24  GLUCOSE 59*  BUN 13  CREATININE 0.66  CALCIUM 9.2   Liver Function Tests:  Recent Labs Lab 06/23/13 1230  AST 40*  ALT 22  ALKPHOS 41  BILITOT 0.8  PROT 7.7  ALBUMIN 3.7   No results found for this basename: LIPASE, AMYLASE,  in the last 168 hours No results found for this basename: AMMONIA,  in the last 168 hours CBC:  Recent Labs Lab 06/23/13 1230  WBC 5.3  NEUTROABS 4.3  HGB 14.0  HCT 39.7  MCV 91.1  PLT 125*   Cardiac Enzymes:  Recent Labs Lab 06/23/13 1230  TROPONINI <0.30    BNP (last 3 results) No results found for this basename: PROBNP,  in the last 8760 hours CBG:  Recent Labs Lab 06/23/13 1411 06/23/13 1537  GLUCAP 59* 97    Radiological Exams on Admission: Ct Head Wo Contrast  06/23/2013   CLINICAL DATA:  79 year old male status post fall. Weakness, abrasion. On  Coumadin. Initial encounter.  EXAM: CT HEAD WITHOUT CONTRAST  TECHNIQUE: Contiguous axial images were obtained from the base of the skull through the vertex without intravenous contrast.  COMPARISON:  Brain MRI 10/08/2011 and earlier.  FINDINGS: Visualized paranasal sinuses and mastoids are clear. Left forehead broad-based scalp hematoma measuring up to 5 mm in thickness. Left globe and orbits soft tissues appear stable and intact.  There appears to be artificial lens dislocation at the right globe (series 3, image 28), new from prior.  No acute osseous abnormality identified.  Calcified atherosclerosis at the skull base. Stable cerebral volume. No ventriculomegaly. No midline shift, mass effect, or evidence of intracranial mass lesion. Stable distal ICA dolichoectasia. No suspicious intracranial vascular hyperdensity.  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 Patchy white matter hypodensity elsewhere is stable. No evidence of cortically based acute infarction identified. No acute intracranial hemorrhage identified.  IMPRESSION: 1. Left scalp hematoma without underlying fracture.  2.  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  3. Stable non contrast CT appearance of the brain with chronic ischemic disease.   Electronically Signed   By: Augusto Gamble M.D.   On: 06/23/2013 13:10  Dg Chest Port 1 View  06/23/2013   CLINICAL DATA:  Fall, weakness, confusion  EXAM: PORTABLE CHEST - 1 VIEW  COMPARISON:  10/08/2011  FINDINGS: Cardiomediastinal silhouette is stable. Status post median sternotomy and cardiac valve replacement. Chronic interstitial prominence again noted. No focal infiltrate or pulmonary edema. Stable left upper lobe calcified granuloma.  IMPRESSION: No active disease.  Chronic interstitial prominence again noted.   Electronically Signed   By: Natasha Mead M.D.   On: 06/23/2013 13:16    EKG: Independently reviewed. Atrial fibrillation, rate of 65, left bundle  branch block old  Assessment/Plan Active Problems:   DYSLIPIDEMIA   HYPERTENSION   CORONARY ARTERY DISEASE   FIBRILLATION, ATRIAL   Encephalopathy acute   Diabetes mellitus, type 2   Hypoglycemia   Supratherapeutic INR  Acute encephalopathy Patient be admitted to telemetry unit. Will conduct neuro checks every 4 hours. Patient is back at his baseline according to wife and daughter. Will place patient on telemetry to monitor for any tachycardia or bradycardia arrhythmias. Patient does have atrial fibrillation with multiple branch block on EKG. Will also obtain ammonia level, B12 and folate levels as well as a TSH level. His encephalopathy may have been secondary to hypoglycemia as patient was found to have a blood sugar in the 20s upon EMS arrival. CT of the head showed a left scalp hematoma without underlying fracture, stable noncontrast CT appearance of the brain with chronic ischemic disease. Patient did not have any leukocytosis and is currently afebrile. His urinalysis was negative for infection, his chest x-ray did not show any infiltrates.  Hypoglycemia in the setting of diabetes mellitus type 2 Will obtain hemoglobin A1c. Will hold his metformin and Lantus for now. Will also place patient on every 4 CBG monitoring with sliding scale. Patient currently is not taking any short-acting insulin.  Hypertension, uncontrolled Patient was recently diagnosed with hypertension on 06/22/2013 and started on antihypertensive medications including metoprolol, Lasix, and enalapril.  At this time we'll continue those medications and hydralazine as needed.  Atrial fibrillation with Supratherapeutic INR Patient was recently on Coumadin until 06/22/2013 and then transitioned to Xarelto. His INR is noted to be 4.55. At this time will hold Xarelto.  Will restart his altered once his INR is less than 2.  Hyperlipidemia Will continue statin.  Fall Will consult physical therapy as well as occupational  therapy.   DVT prophylaxis: Supratherapeutic INR, will restart Xarelto once patient's INR is less than 2.  Code Status: Full  Condition: Guarded  Family Communication: Wife and daughter at bedside. Admission, patients condition and plan of care including tests being ordered have been discussed with the patient and family who indicate understanding and agree with the plan and Code Status.  Disposition Plan: Admitted.   Time spent: 45 minutes  Hadas Jessop D.O. [facility] Pager [phone]  If 7PM-7AM, please contact night-coverage [email or web address] Password [facility] 06/23/2013, 3:53 PM

## 2013-06-24 DIAGNOSIS — I6350 Cerebral infarction due to unspecified occlusion or stenosis of unspecified cerebral artery: Secondary | ICD-10-CM

## 2013-06-24 DIAGNOSIS — E119 Type 2 diabetes mellitus without complications: Secondary | ICD-10-CM

## 2013-06-24 LAB — CBC
MCH: 31.9 pg (ref 26.0–34.0)
MCV: 92.9 fL (ref 78.0–100.0)
Platelets: 129 10*3/uL — ABNORMAL LOW (ref 150–400)
RBC: 3.95 MIL/uL — ABNORMAL LOW (ref 4.22–5.81)
RDW: 13.8 % (ref 11.5–15.5)
WBC: 5.3 10*3/uL (ref 4.0–10.5)

## 2013-06-24 LAB — HEMOGLOBIN A1C
Hgb A1c MFr Bld: 6.3 % — ABNORMAL HIGH (ref <5.7)
Mean Plasma Glucose: 134 mg/dL — ABNORMAL HIGH (ref <117)

## 2013-06-24 LAB — BASIC METABOLIC PANEL
CO2: 24 mEq/L (ref 19–32)
Calcium: 8.6 mg/dL (ref 8.4–10.5)
Creatinine, Ser: 1.06 mg/dL (ref 0.50–1.35)
GFR calc Af Amer: 73 mL/min — ABNORMAL LOW (ref >90)
GFR calc non Af Amer: 63 mL/min — ABNORMAL LOW (ref >90)
Glucose, Bld: 74 mg/dL (ref 70–99)
Sodium: 141 mEq/L (ref 135–145)

## 2013-06-24 LAB — TSH: TSH: 0.184 u[IU]/mL — ABNORMAL LOW (ref 0.350–4.500)

## 2013-06-24 LAB — GLUCOSE, CAPILLARY
Glucose-Capillary: 116 mg/dL — ABNORMAL HIGH (ref 70–99)
Glucose-Capillary: 130 mg/dL — ABNORMAL HIGH (ref 70–99)
Glucose-Capillary: 58 mg/dL — ABNORMAL LOW (ref 70–99)
Glucose-Capillary: 75 mg/dL (ref 70–99)
Glucose-Capillary: 75 mg/dL (ref 70–99)
Glucose-Capillary: 87 mg/dL (ref 70–99)
Glucose-Capillary: 96 mg/dL (ref 70–99)

## 2013-06-24 LAB — VITAMIN B12: Vitamin B-12: 324 pg/mL (ref 211–911)

## 2013-06-24 LAB — PROTIME-INR: Prothrombin Time: 25.5 seconds — ABNORMAL HIGH (ref 11.6–15.2)

## 2013-06-24 MED ORDER — RIVAROXABAN 15 MG PO TABS
15.0000 mg | ORAL_TABLET | Freq: Every day | ORAL | Status: DC
  Administered 2013-06-24 – 2013-06-25 (×2): 15 mg via ORAL
  Filled 2013-06-24 (×2): qty 1

## 2013-06-24 MED ORDER — INSULIN ASPART 100 UNIT/ML ~~LOC~~ SOLN
0.0000 [IU] | Freq: Three times a day (TID) | SUBCUTANEOUS | Status: DC
  Administered 2013-06-25: 3 [IU] via SUBCUTANEOUS

## 2013-06-24 NOTE — Progress Notes (Signed)
PT Cancellation Note  Patient Details Name: Harold Jones MRN: [id number] DOB: [birth year]   Cancelled Treatment:    Reason Eval/Treat Not Completed: Other (comment) (pt is currently on bedrest.  Will need activity order.  ).  PT paged attending requesting activity order if appropriate.  PT to check back later to see if orders updated.   Thanks,   Rollene Rotunda. Rusty Glodowski, PT, DPT [phone]   06/24/2013, 10:52 AM

## 2013-06-24 NOTE — Progress Notes (Signed)
Pt. Experiencing nonsustained bradycardia.  HR drops to the upper 30's and comes back up.  PT. is asymptomatic.   MD notified and aware.  Received order from Dr. Izola Price to discontinue Metoprolol.  Will continue to monitor pt.

## 2013-06-24 NOTE — Progress Notes (Signed)
Pt's am BP was 92/51.  Pt is otherwise stable and asymptomatic.  MD on call notified.  No new orders received.  Will continue to monitor.

## 2013-06-24 NOTE — Progress Notes (Signed)
[facility] PROGRESS NOTE  Harold Jones ZOX:[id number] DOB: 10/22/1927 DOA: 06/23/2013 PCP: Julieanne Manson, MD  Assessment/Plan: Acute encephalopathy Resolved.  His encephalopathy may have been secondary to hypoglycemia as patient was found to have a blood sugar in the 20s upon EMS arrival. CT of the head showed a left scalp hematoma without underlying fracture, stable noncontrast CT appearance of the brain with chronic ischemic disease. Patient did not have any leukocytosis and is currently afebrile. His urinalysis was negative for infection, his chest x-ray did not show any infiltrates.   Hypoglycemia in the setting of diabetes mellitus type 2  Will hold his metformin and Lantus for now. Will also place patient on every 4 CBG monitoring with sliding scale.  Blood sugar at 75 --90. Will observe overnight to determine home insulin regimen.   Hypertension, uncontrolled  Patient was recently diagnosed with hypertension on 06/22/2013 and started on antihypertensive medications including metoprolol, Lasix, and enalapril.  Hold metorpolol due to bradycardia. hola enalapril SBP in the 90.  Atrial fibrillation with Supratherapeutic INR  Patient was recently on Coumadin until 06/22/2013 and then transitioned to Xarelto. His INR is noted to be 4.55. Resume xarelto.   Hyperlipidemia  Will continue statin.   Fall  Home health PT.    Code Status: Full Code.  Family Communication: care discussed with grand daughter.  Disposition Plan: home probably 12-25 if no more episode of hypoglycemia.    Consultants:  none  Procedures:  none  Antibiotics:  none  HPI/Subjective: No chest pain, eating well. Denies pain. I used the   Objective: Filed Vitals:   06/24/13 1317  BP: 124/49  Pulse: 53  Temp: 97.8 F (36.6 C)  Resp: 18    Intake/Output Summary (Last 24 hours) at 06/24/13 1516 Last data filed at 06/24/13 1300  Gross per 24 hour  Intake    358 ml  Output       0 ml  Net    358 ml   Filed Weights   06/23/13 1813  Weight: 91.3 kg (201 lb 4.5 oz)    Exam:   General:  No distress.   Cardiovascular: S 1, S 2 RRR  Respiratory: CTA  Abdomen: Bs present, soft,  Musculoskeletal: no edema.   Data Reviewed: Basic Metabolic Panel:  Recent Labs Lab 06/23/13 1230 06/24/13 0541  NA 144 141  K 4.4 3.7  CL 106 106  CO2 24 24  GLUCOSE 59* 74  BUN 13 17  CREATININE 0.66 1.06  CALCIUM 9.2 8.6   Liver Function Tests:  Recent Labs Lab 06/23/13 1230  AST 40*  ALT 22  ALKPHOS 41  BILITOT 0.8  PROT 7.7  ALBUMIN 3.7   No results found for this basename: LIPASE, AMYLASE,  in the last 168 hours  Recent Labs Lab 06/23/13 1916  AMMONIA 45   CBC:  Recent Labs Lab 06/23/13 1230 06/24/13 0541  WBC 5.3 5.3  NEUTROABS 4.3  --   HGB 14.0 12.6*  HCT 39.7 36.7*  MCV 91.1 92.9  PLT 125* 129*   Cardiac Enzymes:  Recent Labs Lab 06/23/13 1230  TROPONINI <0.30   BNP (last 3 results) No results found for this basename: PROBNP,  in the last 8760 hours CBG:  Recent Labs Lab 06/24/13 0055 06/24/13 0420 06/24/13 0454 06/24/13 0735 06/24/13 1202  GLUCAP 87 58* 75 75 96    No results found for this or any previous visit (from the past 240 hour(s)).   Studies: Ct Head Wo Contrast  06/23/2013   CLINICAL DATA:  79 year old male status post fall. Weakness, abrasion. On Coumadin. Initial encounter.  EXAM: CT HEAD WITHOUT CONTRAST  TECHNIQUE: Contiguous axial images were obtained from the base of the skull through the vertex without intravenous contrast.  COMPARISON:  Brain MRI 10/08/2011 and earlier.  FINDINGS: Visualized paranasal sinuses and mastoids are clear. Left forehead broad-based scalp hematoma measuring up to 5 mm in thickness. Left globe and orbits soft tissues appear stable and intact.  There appears to be artificial lens dislocation at the right globe (series 3, image 28), new from prior.  No acute osseous abnormality  identified.  Calcified atherosclerosis at the skull base. Stable cerebral volume. No ventriculomegaly. No midline shift, mass effect, or evidence of intracranial mass lesion. Stable distal ICA dolichoectasia. No suspicious intracranial vascular hyperdensity.  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 Patchy white matter hypodensity elsewhere is stable. No evidence of cortically based acute infarction identified. No acute intracranial hemorrhage identified.  IMPRESSION: 1. Left scalp hematoma without underlying fracture.  2.  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  3. Stable non contrast CT appearance of the brain with chronic ischemic disease.   Electronically Signed   By: Augusto Gamble M.D.   On: 06/23/2013 13:10   Dg Chest Port 1 View  06/23/2013   CLINICAL DATA:  Fall, weakness, confusion  EXAM: PORTABLE CHEST - 1 VIEW  COMPARISON:  10/08/2011  FINDINGS: Cardiomediastinal silhouette is stable. Status post median sternotomy and cardiac valve replacement. Chronic interstitial prominence again noted. No focal infiltrate or pulmonary edema. Stable left upper lobe calcified granuloma.  IMPRESSION: No active disease.  Chronic interstitial prominence again noted.   Electronically Signed   By: Natasha Mead M.D.   On: 06/23/2013 13:16    Scheduled Meds: . furosemide  20 mg Oral Daily  . gabapentin  300 mg Oral BID  . insulin aspart  0-15 Units Subcutaneous Q4H  . Rivaroxaban  15 mg Oral Daily  . simvastatin  20 mg Oral q1800  . sodium chloride  3 mL Intravenous Q12H   Continuous Infusions:   Active Problems:   DYSLIPIDEMIA   HYPERTENSION   CORONARY ARTERY DISEASE   FIBRILLATION, ATRIAL   Encephalopathy acute   Diabetes mellitus, type 2   Hypoglycemia   Supratherapeutic INR    Time spent: 30    Jonn Chaikin  [facility] Pager [phone]. If 7PM-7AM, please contact night-coverage at [email or web address], password [facility] 06/24/2013, 3:16 PM  LOS: 1  day

## 2013-06-24 NOTE — Progress Notes (Signed)
Unit CM UR Completed by [facility] ED CM  W. Khyler Urda RN  

## 2013-06-24 NOTE — Progress Notes (Signed)
Occupational Therapy Evaluation Patient Details Name: Harold Jones MRN: [id number] DOB: 1927/08/13 Today's Date: 06/24/2013 Time: 4098-1191 OT Time Calculation (min): 19 min  OT Assessment / Plan / Recommendation History of present illness   79 y.o. male  With a history of hypertension, hyperlipidemia, diabetes mellitus type 2, atrial fibrillation on anticoagulation that presents emergency department for confusion and combativeness. Patient was found to have a blood sugar in the 20s per EMS. On arrival to the emergency department patient was also noted to have a low blood sugar    Clinical Impression   Pt appears to be close to baseline regarding ADL. Pt has 24/7 S and will be able to D/C home safely with available S. Through interpreter, discussed home safety and nec modifications to reduce risk of falls. Pt verbalized understanding.    OT Assessment  Patient does not need any further OT services    Follow Up Recommendations  No OT follow up    Barriers to Discharge      Equipment Recommendations  None recommended by OT    Recommendations for Other Services    Frequency       Precautions / Restrictions Precautions Precautions: Fall   Pertinent Vitals/Pain no apparent distress     ADL  Transfers/Ambulation Related to ADLs: s ADL Comments: Overall S for all ADL    OT Diagnosis:    OT Problem List:   OT Treatment Interventions:     OT Goals(Current goals can be found in the care plan section)    Visit Information  Last OT Received On: 06/24/13 Assistance Needed: +1       Prior Functioning     Home Living Family/patient expects to be discharged to:: Private residence Living Arrangements: Spouse/significant other;Children Available Help at Discharge: Available 24 hours/day Type of Home: House Home Layout: One level Home Equipment: Shower seat;Cane - single point;Grab bars - tub/shower Prior Function Level of Independence: Independent;Independent with  assistive device(s) ([place]) Communication Communication: Interpreter utilized;Prefers language other than English         Vision/Perception     Cognition  Cognition Arousal/Alertness: Awake/alert Behavior During Therapy: WFL for tasks assessed/performed Overall Cognitive Status: No family/caregiver present to determine baseline cognitive functioning (appears [facility])    Extremity/Trunk Assessment Upper Extremity Assessment Upper Extremity Assessment: Overall WFL for tasks assessed Lower Extremity Assessment Lower Extremity Assessment: Defer to PT evaluation Cervical / Trunk Assessment Cervical / Trunk Assessment: Normal     Mobility Bed Mobility Bed Mobility: Sit to Supine Sit to Supine: 6: Modified independent (Device/Increase time) Transfers Transfers: Sit to Stand;Stand to Sit Sit to Stand: 5: Supervision;From bed Stand to Sit: 5: Supervision;To bed     Exercise     Balance Balance Balance Assessed:  (uses can at home - unsteady at times, furniture wqalks)   End of Session OT - End of Session Activity Tolerance: Patient tolerated treatment well Patient left: in bed;with call bell/phone within reach Nurse Communication: Mobility status  GO     Saabir Blyth,HILLARY 06/24/2013, 12:05 PM [facility], OTR/L  [phone] 06/24/2013

## 2013-06-24 NOTE — Evaluation (Signed)
Physical Therapy Evaluation Patient Details Name: Harold Jones MRN: [id number] DOB: Sep 19, 1927 Today's Date: 06/24/2013 Time: 9562-1308 PT Time Calculation (min): 45 min  PT Assessment / Plan / Recommendation History of Present Illness  79 y.o. [place] male admitted to [facility] on 06/23/13 With a history of hypertension, hyperlipidemia, diabetes mellitus type 2, atrial fibrillation on anticoagulation that presents emergency department for confusion and combativeness. Patient was found to have a blood sugar in the 20s per EMS.   Clinical Impression  Phone interpreter used during session.  The pt seems generally alert and oriented.  Orthostatic vitals taken and were (-).  Pt was asymptomatic as far as no reports of chest pain, SOB, or lightheadedness during our session.  He did run fairly low on HR mid 40s-mid 50s even with activity.  Pt reports he only feels a little weak in the legs from being in the bed so much over the past day or so.  I encouraged him to use his cane at home all the time until his legs felt stronger.   PT to follow acutely for deficits listed below.     PT Assessment  Patient needs continued PT services    Follow Up Recommendations  Supervision for mobility/OOB (for the first 24-48 hours)    Does the patient have the potential to tolerate intense rehabilitation     NA  Barriers to Discharge   None      Equipment Recommendations  None recommended by PT    Recommendations for Other Services   None  Frequency Min 3X/week    Precautions / Restrictions Precautions Precautions: Fall Precaution Comments: h/o recent fall with forhead laceration.  Pt reports he fell walking outside while it was raining.    Pertinent Vitals/Pain See vitals flow sheet.      Mobility  Bed Mobility Bed Mobility: Supine to Sit;Sitting - Scoot to Edge of Bed Supine to Sit: 6: Modified independent (Device/Increase time);With rails;HOB elevated Sitting - Scoot to Edge of Bed: 6: Modified  independent (Device/Increase time);With rail Sit to Supine: 6: Modified independent (Device/Increase time) Details for Bed Mobility Assistance: pt pulling on railing for support, but moved EOB easily Transfers Transfers: Sit to Stand;Stand to Sit Sit to Stand: 5: Supervision;From bed;From toilet Stand to Sit: 5: Supervision;To bed;To toilet Details for Transfer Assistance: supervision for safety because we are not using a cane right now.  Ambulation/Gait Ambulation/Gait Assistance: 5: Supervision Ambulation Distance (Feet): 300 Feet Assistive device: None Ambulation/Gait Assistance Details: Started with min hand held assist, but after a few feet, pt motioned that he did not need my hand.  Mildly staggering gait pattern indicating to me that it would be good for him to continue to use his cane for gait, but no deviations requiring external assist to prevent a fall.   Gait Pattern: Step-through pattern (mildly staggering. ) Stairs: Yes Stairs Assistance: 5: Supervision Stairs Assistance Details (indicate cue type and reason): supervision for safety Stair Management Technique: One rail Right;One rail Left;Alternating pattern;Forwards Number of Stairs: 9        PT Diagnosis: Difficulty walking;Abnormality of gait;Generalized weakness  PT Problem List: Decreased strength;Decreased activity tolerance;Decreased mobility;Decreased balance PT Treatment Interventions: DME instruction;Gait training;Stair training;Functional mobility training;Therapeutic activities;Therapeutic exercise;Neuromuscular re-education;Balance training;Patient/family education     PT Goals(Current goals can be found in the care plan section) Acute Rehab PT Goals Patient Stated Goal: to go home today if he can PT Goal Formulation: With patient Time For Goal Achievement: 07/08/13 Potential to Achieve Goals: Good  Visit Information  Last PT Received On: 06/24/13 Assistance Needed: +1 Reason Eval/Treat Not Completed:  Other (comment) (pt is currently on bedrest.  Will need activity order.  ) History of Present Illness: 79 y.o. [place] male admitted to [facility] on 06/23/13 With a history of hypertension, hyperlipidemia, diabetes mellitus type 2, atrial fibrillation on anticoagulation that presents emergency department for confusion and combativeness. Patient was found to have a blood sugar in the 20s per EMS.        Prior Functioning  Home Living Family/patient expects to be discharged to:: Private residence Living Arrangements: Spouse/significant other;Children Available Help at Discharge: Available 24 hours/day Type of Home: House Home Layout: One level Home Equipment: Shower seat;Cane - single point;Grab bars - tub/shower Prior Function Level of Independence: Independent;Independent with assistive device(s) Comments: pt does not drive Communication Communication: Interpreter utilized;Prefers language other than English;HOH (used pacific interpreter)    Cognition  Cognition Arousal/Alertness: Awake/alert Behavior During Therapy: WFL for tasks assessed/performed Overall Cognitive Status: No family/caregiver present to determine baseline cognitive functioning    Extremity/Trunk Assessment Upper Extremity Assessment Upper Extremity Assessment: Defer to OT evaluation Lower Extremity Assessment Lower Extremity Assessment: Generalized weakness (some mild impairments with functional testing, equal L v R) Cervical / Trunk Assessment Cervical / Trunk Assessment: Normal   Balance Balance Balance Assessed:  (uses can at home - unsteady at times, furniture wqalks)  End of Session PT - End of Session Equipment Utilized During Treatment: Gait belt Activity Tolerance: Patient tolerated treatment well Patient left: in bed;Other (comment) (with OT entering room to evaluate the pt.  )  GP Functional Assessment Tool Used: assist level Functional Limitation: Mobility: Walking and moving around Mobility: Walking and  Moving Around Current Status ([id number]): At least 1 percent but less than 20 percent impaired, limited or restricted Mobility: Walking and Moving Around Goal Status [phone]): 0 percent impaired, limited or restricted   Nicolaas Savo B. Declyn Delsol, PT, DPT [phone]   06/24/2013, 12:19 PM

## 2013-06-25 LAB — GLUCOSE, CAPILLARY
Glucose-Capillary: 125 mg/dL — ABNORMAL HIGH (ref 70–99)
Glucose-Capillary: 110 mg/dL — ABNORMAL HIGH (ref 70–99)
Glucose-Capillary: 111 mg/dL — ABNORMAL HIGH (ref 70–99)
Glucose-Capillary: 222 mg/dL — ABNORMAL HIGH (ref 70–99)

## 2013-06-25 LAB — T3, FREE: T3, Free: 2.7 pg/mL (ref 2.3–4.2)

## 2013-06-25 LAB — T4, FREE: Free T4: 1.19 ng/dL (ref 0.80–1.80)

## 2013-06-25 MED ORDER — FUROSEMIDE 20 MG PO TABS
20.0000 mg | ORAL_TABLET | Freq: Every day | ORAL | Status: DC
  Administered 2013-06-25: 20 mg via ORAL
  Filled 2013-06-25: qty 1

## 2013-06-25 NOTE — Discharge Summary (Signed)
Physician Discharge Summary  Harold Jones ZOX:[id number] DOB: 1928/05/19 DOA: 06/23/2013  PCP: Julieanne Manson, MD  Admit date: 06/23/2013 Discharge date: 06/25/2013  Time spent: 35 minutes  Recommendations for Outpatient Follow-up:  1. Follow up with PCP for further adjustment of insulin and management of diabetes and BP.   Discharge Diagnoses:    Encephalopathy in setting of hypoglycemia.    DYSLIPIDEMIA   HYPERTENSION   CORONARY ARTERY DISEASE   FIBRILLATION, ATRIAL   Encephalopathy acute   Diabetes mellitus, type 2   Hypoglycemia   Supratherapeutic INR   Discharge Condition: Stable.   Diet recommendation: Carb modified diet.   Filed Weights   06/23/13 1813 06/25/13 0605  Weight: 91.3 kg (201 lb 4.5 oz) 92.216 kg (203 lb 4.8 oz)    History of present illness:  is a 79 y.o. male  With a history of hypertension, hyperlipidemia, diabetes mellitus type 2, atrial fibrillation on anticoagulation that presents emergency department for confusion and combativeness. Patient was found to have a blood sugar in the 20s per EMS. On arrival to the emergency department patient was also noted to have a low blood sugar. Patient does not speak [place], he is [place], his daughter was the Nurse, learning disability. It seems that over the last few days, patient has been more agitated and different not himself. He also had one episode of incontinence. Patient has not had any other complaints. He is not experiencing any abdominal pain nausea or vomiting or constipation diarrhea. He has not complained of any chest pain or shortness of breath. Patient did however experience a fall a few days ago and does have a hematoma noted on the left forehead. Patient recently went to see his primary care physician at which point, he was taken off of Coumadin and placed on Xarelto. Patient was recently diagnosed with hypertension, was placed on antihypertensive medications yesterday 06/22/2013. Of note, patient is back to  his baseline at this time.   Hospital Course:  Acute encephalopathy Resolved.  His encephalopathy may have been secondary to hypoglycemia as patient was found to have a blood sugar in the 20s upon EMS arrival. CT of the head showed a left scalp hematoma without underlying fracture, stable noncontrast CT appearance of the brain with chronic ischemic disease. Patient did not have any leukocytosis and is currently afebrile. His urinalysis was negative for infection, his chest x-ray did not show any infiltrates.  Patient feeling well, wants to go home, no complaints. I use translator line.   Hypoglycemia in the setting of diabetes mellitus type 2  BS 110 to 125 range. He has not received significant amount of insulin. Will resume metformin at discharge. Hold lantus. Needs to follow up with PCP to resume lantus dose, would use significant lower dose.   Hypertension, uncontrolled  Patient was recently diagnosed with hypertension on 06/22/2013 and started on antihypertensive medications including metoprolol, Lasix, and enalapril.  Hold metorpolol due to bradycardia. SBP increased. He will be discharge on lasix and lisinopril.   Atrial fibrillation with Supratherapeutic INR  Patient was recently on Coumadin until 06/22/2013 and then transitioned to Xarelto. His INR is noted to be 4.55.  Resume xarelto. Consider repeat renal function and adjust dose of xarelto as needed.    Hyperlipidemia  Will continue statin.   Fall  Home health PT.    Procedures:  none  Consultations:  none  Discharge Exam: Filed Vitals:   06/25/13 0642  BP: 165/67  Pulse:   Temp:   Resp:  General: no distress.  Cardiovascular: S 1, S 2 RRR Respiratory: CTA  Discharge Instructions  Discharge Orders   Future Orders Complete By Expires   Diet - low sodium heart healthy  As directed    Increase activity slowly  As directed        Medication List    STOP taking these medications       aspirin EC  81 MG tablet     insulin glargine 100 UNIT/ML injection  Commonly known as:  LANTUS     metoprolol 100 MG tablet  Commonly known as:  LOPRESSOR      TAKE these medications       donepezil 5 MG tablet  Commonly known as:  ARICEPT  Take 5 mg by mouth at bedtime.     enalapril 20 MG tablet  Commonly known as:  VASOTEC  Take 20 mg by mouth daily.     furosemide 20 MG tablet  Commonly known as:  LASIX  Take 20 mg by mouth daily.     gabapentin 300 MG capsule  Commonly known as:  NEURONTIN  Take 1 capsule (300 mg total) by mouth 2 (two) times daily.     metFORMIN 500 MG tablet  Commonly known as:  GLUCOPHAGE  Take 500 mg by mouth 2 (two) times daily with a meal.     pravastatin 40 MG tablet  Commonly known as:  PRAVACHOL  Take 40 mg by mouth daily.     Rivaroxaban 15 MG Tabs tablet  Commonly known as:  XARELTO  Take 15 mg by mouth daily.       No Known Allergies     Follow-up Information   Follow up with [facility], MD In 3 days.   Specialty:  Internal Medicine   Contact information:   [address] [place] [address] [phone]        The results of significant diagnostics from this hospitalization (including imaging, microbiology, ancillary and laboratory) are listed below for reference.    Significant Diagnostic Studies: Ct Head Wo Contrast  06/23/2013   CLINICAL DATA:  79 year old male status post fall. Weakness, abrasion. On Coumadin. Initial encounter.  EXAM: CT HEAD WITHOUT CONTRAST  TECHNIQUE: Contiguous axial images were obtained from the base of the skull through the vertex without intravenous contrast.  COMPARISON:  Brain MRI 10/08/2011 and earlier.  FINDINGS: Visualized paranasal sinuses and mastoids are clear. Left forehead broad-based scalp hematoma measuring up to 5 mm in thickness. Left globe and orbits soft tissues appear stable and intact.  There appears to be artificial lens dislocation at the right globe (series  3, image 28), new from prior.  No acute osseous abnormality identified.  Calcified atherosclerosis at the skull base. Stable cerebral volume. No ventriculomegaly. No midline shift, mass effect, or evidence of intracranial mass lesion. Stable distal ICA dolichoectasia. No suspicious intracranial vascular hyperdensity.  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 Patchy white matter hypodensity elsewhere is stable. No evidence of cortically based acute infarction identified. No acute intracranial hemorrhage identified.  IMPRESSION: 1. Left scalp hematoma without underlying fracture.  2.  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  3. Stable non contrast CT appearance of the brain with chronic ischemic disease.   Electronically Signed   By: Augusto Gamble M.D.   On: 06/23/2013 13:10   Dg Chest Port 1 View  06/23/2013   CLINICAL DATA:  Fall, weakness, confusion  EXAM: PORTABLE CHEST -  1 VIEW  COMPARISON:  10/08/2011  FINDINGS: Cardiomediastinal silhouette is stable. Status post median sternotomy and cardiac valve replacement. Chronic interstitial prominence again noted. No focal infiltrate or pulmonary edema. Stable left upper lobe calcified granuloma.  IMPRESSION: No active disease.  Chronic interstitial prominence again noted.   Electronically Signed   By: Natasha Mead M.D.   On: 06/23/2013 13:16    Microbiology: No results found for this or any previous visit (from the past 240 hour(s)).   Labs: Basic Metabolic Panel:  Recent Labs Lab 06/23/13 1230 06/24/13 0541  NA 144 141  K 4.4 3.7  CL 106 106  CO2 24 24  GLUCOSE 59* 74  BUN 13 17  CREATININE 0.66 1.06  CALCIUM 9.2 8.6   Liver Function Tests:  Recent Labs Lab 06/23/13 1230  AST 40*  ALT 22  ALKPHOS 41  BILITOT 0.8  PROT 7.7  ALBUMIN 3.7   No results found for this basename: LIPASE, AMYLASE,  in the last 168 hours  Recent Labs Lab 06/23/13 1916  AMMONIA 45   CBC:  Recent Labs Lab  06/23/13 1230 06/24/13 0541  WBC 5.3 5.3  NEUTROABS 4.3  --   HGB 14.0 12.6*  HCT 39.7 36.7*  MCV 91.1 92.9  PLT 125* 129*   Cardiac Enzymes:  Recent Labs Lab 06/23/13 1230  TROPONINI <0.30   BNP: BNP (last 3 results) No results found for this basename: PROBNP,  in the last 8760 hours CBG:  Recent Labs Lab 06/24/13 2119 06/25/13 0111 06/25/13 0408 06/25/13 0727 06/25/13 1159  GLUCAP 130* 121* 125* 110* 111*       Signed:  Treson Laura  [facility] 06/25/2013, 12:43 PM

## 2013-11-12 ENCOUNTER — Observation Stay (HOSPITAL_COMMUNITY)
Admission: EM | Admit: 2013-11-12 | Discharge: 2013-11-14 | Disposition: A | Discharge status: Home / Self Care | Payer: Medicare Other | Attending: Internal Medicine | Admitting: Internal Medicine

## 2013-11-12 ENCOUNTER — Emergency Department (HOSPITAL_COMMUNITY): Payer: Medicare Other

## 2013-11-12 ENCOUNTER — Encounter (HOSPITAL_COMMUNITY): Payer: Self-pay | Admitting: Emergency Medicine

## 2013-11-12 DIAGNOSIS — E785 Hyperlipidemia, unspecified: Secondary | ICD-10-CM

## 2013-11-12 DIAGNOSIS — E162 Hypoglycemia, unspecified: Secondary | ICD-10-CM

## 2013-11-12 DIAGNOSIS — S0003XA Contusion of scalp, initial encounter: Secondary | ICD-10-CM | POA: Insufficient documentation

## 2013-11-12 DIAGNOSIS — M1710 Unilateral primary osteoarthritis, unspecified knee: Secondary | ICD-10-CM | POA: Insufficient documentation

## 2013-11-12 DIAGNOSIS — I639 Cerebral infarction, unspecified: Secondary | ICD-10-CM

## 2013-11-12 DIAGNOSIS — Y92009 Unspecified place in unspecified non-institutional (private) residence as the place of occurrence of the external cause: Secondary | ICD-10-CM | POA: Insufficient documentation

## 2013-11-12 DIAGNOSIS — Z7901 Long term (current) use of anticoagulants: Secondary | ICD-10-CM | POA: Insufficient documentation

## 2013-11-12 DIAGNOSIS — W1809XA Striking against other object with subsequent fall, initial encounter: Secondary | ICD-10-CM | POA: Insufficient documentation

## 2013-11-12 DIAGNOSIS — R791 Abnormal coagulation profile: Secondary | ICD-10-CM

## 2013-11-12 DIAGNOSIS — S0083XA Contusion of other part of head, initial encounter: Secondary | ICD-10-CM | POA: Insufficient documentation

## 2013-11-12 DIAGNOSIS — I10 Essential (primary) hypertension: Secondary | ICD-10-CM

## 2013-11-12 DIAGNOSIS — I712 Thoracic aortic aneurysm, without rupture, unspecified: Secondary | ICD-10-CM

## 2013-11-12 DIAGNOSIS — H409 Unspecified glaucoma: Secondary | ICD-10-CM

## 2013-11-12 DIAGNOSIS — Z954 Presence of other heart-valve replacement: Secondary | ICD-10-CM

## 2013-11-12 DIAGNOSIS — M25569 Pain in unspecified knee: Secondary | ICD-10-CM

## 2013-11-12 DIAGNOSIS — S022XXA Fracture of nasal bones, initial encounter for closed fracture: Secondary | ICD-10-CM

## 2013-11-12 DIAGNOSIS — I4891 Unspecified atrial fibrillation: Secondary | ICD-10-CM

## 2013-11-12 DIAGNOSIS — I498 Other specified cardiac arrhythmias: Secondary | ICD-10-CM

## 2013-11-12 DIAGNOSIS — G9340 Encephalopathy, unspecified: Secondary | ICD-10-CM

## 2013-11-12 DIAGNOSIS — B029 Zoster without complications: Secondary | ICD-10-CM

## 2013-11-12 DIAGNOSIS — S1093XA Contusion of unspecified part of neck, initial encounter: Secondary | ICD-10-CM

## 2013-11-12 DIAGNOSIS — E119 Type 2 diabetes mellitus without complications: Secondary | ICD-10-CM

## 2013-11-12 DIAGNOSIS — I2510 Atherosclerotic heart disease of native coronary artery without angina pectoris: Secondary | ICD-10-CM

## 2013-11-12 DIAGNOSIS — I6350 Cerebral infarction due to unspecified occlusion or stenosis of unspecified cerebral artery: Secondary | ICD-10-CM

## 2013-11-12 DIAGNOSIS — R4182 Altered mental status, unspecified: Secondary | ICD-10-CM

## 2013-11-12 DIAGNOSIS — R55 Syncope and collapse: Principal | ICD-10-CM

## 2013-11-12 DIAGNOSIS — E1169 Type 2 diabetes mellitus with other specified complication: Secondary | ICD-10-CM | POA: Insufficient documentation

## 2013-11-12 DIAGNOSIS — M1990 Unspecified osteoarthritis, unspecified site: Secondary | ICD-10-CM

## 2013-11-12 DIAGNOSIS — R0989 Other specified symptoms and signs involving the circulatory and respiratory systems: Secondary | ICD-10-CM

## 2013-11-12 DIAGNOSIS — Z79899 Other long term (current) drug therapy: Secondary | ICD-10-CM | POA: Insufficient documentation

## 2013-11-12 DIAGNOSIS — K922 Gastrointestinal hemorrhage, unspecified: Secondary | ICD-10-CM

## 2013-11-12 DIAGNOSIS — I359 Nonrheumatic aortic valve disorder, unspecified: Secondary | ICD-10-CM

## 2013-11-12 DIAGNOSIS — F0390 Unspecified dementia without behavioral disturbance: Secondary | ICD-10-CM | POA: Insufficient documentation

## 2013-11-12 DIAGNOSIS — R040 Epistaxis: Secondary | ICD-10-CM | POA: Insufficient documentation

## 2013-11-12 DIAGNOSIS — Y9389 Activity, other specified: Secondary | ICD-10-CM | POA: Insufficient documentation

## 2013-11-12 DIAGNOSIS — IMO0002 Reserved for concepts with insufficient information to code with codable children: Secondary | ICD-10-CM | POA: Insufficient documentation

## 2013-11-12 DIAGNOSIS — Z952 Presence of prosthetic heart valve: Secondary | ICD-10-CM

## 2013-11-12 DIAGNOSIS — I69919 Unspecified symptoms and signs involving cognitive functions following unspecified cerebrovascular disease: Secondary | ICD-10-CM

## 2013-11-12 HISTORY — DX: Cerebral infarction due to unspecified occlusion or stenosis of unspecified cerebral artery: I63.50

## 2013-11-12 HISTORY — DX: Unspecified atrial fibrillation: I48.91

## 2013-11-12 HISTORY — DX: Atherosclerotic heart disease of native coronary artery without angina pectoris: I25.10

## 2013-11-12 HISTORY — DX: Hyperlipidemia, unspecified: E78.5

## 2013-11-12 HISTORY — DX: Unspecified symptoms and signs involving cognitive functions following unspecified cerebrovascular disease: I69.919

## 2013-11-12 LAB — HEMOGLOBIN AND HEMATOCRIT, BLOOD
HCT: 36 % — ABNORMAL LOW (ref 39.0–52.0)
HEMATOCRIT: 34.4 % — AB (ref 39.0–52.0)
Hemoglobin: 12.6 g/dL — ABNORMAL LOW (ref 13.0–17.0)
Hemoglobin: 12 g/dL — ABNORMAL LOW (ref 13.0–17.0)

## 2013-11-12 LAB — I-STAT TROPONIN, ED: TROPONIN I, POC: 0.07 ng/mL (ref 0.00–0.08)

## 2013-11-12 LAB — PROTIME-INR
INR: 1.23 (ref 0.00–1.49)
Prothrombin Time: 15.2 seconds (ref 11.6–15.2)

## 2013-11-12 LAB — COMPREHENSIVE METABOLIC PANEL
ALT: 9 U/L (ref 0–53)
AST: 18 U/L (ref 0–37)
Albumin: 3.9 g/dL (ref 3.5–5.2)
Alkaline Phosphatase: 39 U/L (ref 39–117)
BILIRUBIN TOTAL: 1 mg/dL (ref 0.3–1.2)
BUN: 16 mg/dL (ref 6–23)
CHLORIDE: 101 meq/L (ref 96–112)
CO2: 25 meq/L (ref 19–32)
Calcium: 9.7 mg/dL (ref 8.4–10.5)
Creatinine, Ser: 0.85 mg/dL (ref 0.50–1.35)
GFR, EST NON AFRICAN AMERICAN: 78 mL/min — AB (ref >90)
GLUCOSE: 226 mg/dL — AB (ref 70–99)
Potassium: 4.3 mEq/L (ref 3.7–5.3)
SODIUM: 139 meq/L (ref 137–147)
Total Protein: 7.5 g/dL (ref 6.0–8.3)

## 2013-11-12 LAB — URINALYSIS, ROUTINE W REFLEX MICROSCOPIC
BILIRUBIN URINE: NEGATIVE
Glucose, UA: 250 mg/dL — AB
Ketones, ur: 15 mg/dL — AB
Leukocytes, UA: NEGATIVE
Nitrite: NEGATIVE
PH: 7 (ref 5.0–8.0)
Protein, ur: >300 mg/dL — AB
SPECIFIC GRAVITY, URINE: 1.016 (ref 1.005–1.030)
Urobilinogen, UA: 0.2 mg/dL (ref 0.0–1.0)

## 2013-11-12 LAB — CBC WITH DIFFERENTIAL/PLATELET
Basophils Absolute: 0 10*3/uL (ref 0.0–0.1)
Basophils Relative: 0 % (ref 0–1)
EOS ABS: 0 10*3/uL (ref 0.0–0.7)
Eosinophils Relative: 0 % (ref 0–5)
HCT: 39 % (ref 39.0–52.0)
HEMOGLOBIN: 13.5 g/dL (ref 13.0–17.0)
LYMPHS ABS: 0.5 10*3/uL — AB (ref 0.7–4.0)
LYMPHS PCT: 7 % — AB (ref 12–46)
MCH: 30.7 pg (ref 26.0–34.0)
MCHC: 34.6 g/dL (ref 30.0–36.0)
MCV: 88.6 fL (ref 78.0–100.0)
MONOS PCT: 5 % (ref 3–12)
Monocytes Absolute: 0.4 10*3/uL (ref 0.1–1.0)
NEUTROS ABS: 7 10*3/uL (ref 1.7–7.7)
NEUTROS PCT: 88 % — AB (ref 43–77)
Platelets: 140 10*3/uL — ABNORMAL LOW (ref 150–400)
RBC: 4.4 MIL/uL (ref 4.22–5.81)
RDW: 12.9 % (ref 11.5–15.5)
WBC: 7.9 10*3/uL (ref 4.0–10.5)

## 2013-11-12 LAB — URINE MICROSCOPIC-ADD ON

## 2013-11-12 LAB — TROPONIN I
Troponin I: <0.3 ng/mL (ref <0.30)
Troponin I: <0.3 ng/mL (ref <0.30)

## 2013-11-12 LAB — TYPE AND SCREEN
ABO/RH(D): AB POS
ANTIBODY SCREEN: NEGATIVE

## 2013-11-12 LAB — CBG MONITORING, ED: GLUCOSE-CAPILLARY: 196 mg/dL — AB (ref 70–99)

## 2013-11-12 LAB — GLUCOSE, CAPILLARY: GLUCOSE-CAPILLARY: 196 mg/dL — AB (ref 70–99)

## 2013-11-12 LAB — ABO/RH: ABO/RH(D): AB POS

## 2013-11-12 MED ORDER — ONDANSETRON HCL 4 MG/2ML IJ SOLN
4.0000 mg | Freq: Four times a day (QID) | INTRAMUSCULAR | Status: DC | PRN
  Administered 2013-11-12: 4 mg via INTRAVENOUS
  Filled 2013-11-12 (×2): qty 2

## 2013-11-12 MED ORDER — ONDANSETRON HCL 4 MG/2ML IJ SOLN
4.0000 mg | Freq: Once | INTRAMUSCULAR | Status: AC
  Administered 2013-11-12: 4 mg via INTRAVENOUS

## 2013-11-12 MED ORDER — POLYETHYLENE GLYCOL 3350 17 G PO PACK
17.0000 g | PACK | Freq: Every day | ORAL | Status: DC | PRN
  Filled 2013-11-12: qty 1

## 2013-11-12 MED ORDER — GABAPENTIN 300 MG PO CAPS
300.0000 mg | ORAL_CAPSULE | Freq: Two times a day (BID) | ORAL | Status: DC
  Administered 2013-11-12 – 2013-11-14 (×4): 300 mg via ORAL
  Filled 2013-11-12 (×5): qty 1

## 2013-11-12 MED ORDER — HYDROCODONE-ACETAMINOPHEN 5-325 MG PO TABS
1.0000 | ORAL_TABLET | ORAL | Status: DC | PRN
  Administered 2013-11-14: 1 via ORAL
  Administered 2013-11-14: 2 via ORAL
  Filled 2013-11-12: qty 2
  Filled 2013-11-12: qty 1

## 2013-11-12 MED ORDER — SODIUM CHLORIDE 0.9 % IV SOLN: 250.0000 mL | INTRAVENOUS | Status: AC | PRN

## 2013-11-12 MED ORDER — MORPHINE SULFATE 2 MG/ML IJ SOLN
2.0000 mg | INTRAMUSCULAR | Status: DC | PRN
  Administered 2013-11-12: 2 mg via INTRAVENOUS
  Filled 2013-11-12: qty 1

## 2013-11-12 MED ORDER — METOPROLOL TARTRATE 1 MG/ML IV SOLN: 5.0000 mg | INTRAVENOUS | Status: DC | PRN

## 2013-11-12 MED ORDER — GUAIFENESIN-DM 100-10 MG/5ML PO SYRP
5.0000 mL | ORAL_SOLUTION | ORAL | Status: DC | PRN
  Filled 2013-11-12: qty 5

## 2013-11-12 MED ORDER — RIVAROXABAN 15 MG PO TABS
15.0000 mg | ORAL_TABLET | Freq: Every day | ORAL | Status: DC
  Filled 2013-11-12: qty 1

## 2013-11-12 MED ORDER — INSULIN ASPART 100 UNIT/ML ~~LOC~~ SOLN
0.0000 [IU] | Freq: Three times a day (TID) | SUBCUTANEOUS | Status: DC
  Administered 2013-11-12 – 2013-11-13 (×2): 3 [IU] via SUBCUTANEOUS
  Administered 2013-11-13 – 2013-11-14 (×2): 2 [IU] via SUBCUTANEOUS
  Administered 2013-11-14: 3 [IU] via SUBCUTANEOUS

## 2013-11-12 MED ORDER — DONEPEZIL HCL 5 MG PO TABS
5.0000 mg | ORAL_TABLET | Freq: Every day | ORAL | Status: DC
  Administered 2013-11-12 – 2013-11-13 (×2): 5 mg via ORAL
  Filled 2013-11-12 (×3): qty 1

## 2013-11-12 MED ORDER — SODIUM CHLORIDE 0.9 % IJ SOLN
3.0000 mL | Freq: Two times a day (BID) | INTRAMUSCULAR | Status: DC
  Administered 2013-11-12 – 2013-11-14 (×4): 3 mL via INTRAVENOUS

## 2013-11-12 MED ORDER — SIMVASTATIN 10 MG PO TABS
10.0000 mg | ORAL_TABLET | Freq: Every day | ORAL | Status: DC
  Administered 2013-11-12 – 2013-11-13 (×2): 10 mg via ORAL
  Filled 2013-11-12 (×3): qty 1

## 2013-11-12 MED ORDER — FENTANYL CITRATE 0.05 MG/ML IJ SOLN: 50.0000 ug | Freq: Once | INTRAMUSCULAR | Status: DC

## 2013-11-12 MED ORDER — MORPHINE SULFATE 4 MG/ML IJ SOLN
4.0000 mg | Freq: Once | INTRAMUSCULAR | Status: AC
  Administered 2013-11-12: 4 mg via INTRAVENOUS
  Filled 2013-11-12: qty 1

## 2013-11-12 MED ORDER — AMLODIPINE BESYLATE 10 MG PO TABS
10.0000 mg | ORAL_TABLET | Freq: Every day | ORAL | Status: DC
  Administered 2013-11-12 – 2013-11-14 (×3): 10 mg via ORAL
  Filled 2013-11-12 (×3): qty 1

## 2013-11-12 MED ORDER — ONDANSETRON HCL 4 MG PO TABS: 4.0000 mg | ORAL_TABLET | Freq: Four times a day (QID) | ORAL | Status: DC | PRN

## 2013-11-12 MED ORDER — FUROSEMIDE 20 MG PO TABS
20.0000 mg | ORAL_TABLET | Freq: Every day | ORAL | Status: DC
  Administered 2013-11-12 – 2013-11-13 (×2): 20 mg via ORAL
  Filled 2013-11-12 (×4): qty 1

## 2013-11-12 MED ORDER — PANTOPRAZOLE SODIUM 40 MG IV SOLR
40.0000 mg | Freq: Two times a day (BID) | INTRAVENOUS | Status: DC
  Administered 2013-11-12 – 2013-11-13 (×2): 40 mg via INTRAVENOUS
  Filled 2013-11-12 (×3): qty 40

## 2013-11-12 MED ORDER — SODIUM CHLORIDE 0.9 % IJ SOLN: 3.0000 mL | INTRAMUSCULAR | Status: DC | PRN

## 2013-11-12 MED ORDER — ONDANSETRON HCL 4 MG/2ML IJ SOLN
4.0000 mg | Freq: Once | INTRAMUSCULAR | Status: AC
  Administered 2013-11-12: 4 mg via INTRAVENOUS
  Filled 2013-11-12: qty 2

## 2013-11-12 MED ORDER — INSULIN ASPART 100 UNIT/ML ~~LOC~~ SOLN: 0.0000 [IU] | Freq: Every day | SUBCUTANEOUS | Status: DC

## 2013-11-12 MED ORDER — SODIUM CHLORIDE 0.9 % IV SOLN: 250.0000 mL | INTRAVENOUS | Status: DC | PRN

## 2013-11-12 MED ORDER — TETANUS-DIPHTH-ACELL PERTUSSIS 5-2.5-18.5 LF-MCG/0.5 IM SUSP
0.5000 mL | Freq: Once | INTRAMUSCULAR | Status: AC
  Administered 2013-11-12: 0.5 mL via INTRAMUSCULAR
  Filled 2013-11-12: qty 0.5

## 2013-11-12 MED ORDER — OXYMETAZOLINE HCL 0.05 % NA SOLN
1.0000 | Freq: Once | NASAL | Status: AC
  Administered 2013-11-12: 1 via NASAL
  Filled 2013-11-12: qty 15

## 2013-11-12 MED ORDER — HYDRALAZINE HCL 20 MG/ML IJ SOLN
10.0000 mg | Freq: Four times a day (QID) | INTRAMUSCULAR | Status: DC | PRN
  Administered 2013-11-12: 10 mg via INTRAVENOUS
  Filled 2013-11-12: qty 1

## 2013-11-12 MED ORDER — ENALAPRIL MALEATE 20 MG PO TABS
20.0000 mg | ORAL_TABLET | Freq: Every day | ORAL | Status: DC
  Administered 2013-11-12 – 2013-11-14 (×3): 20 mg via ORAL
  Filled 2013-11-12 (×3): qty 1

## 2013-11-12 NOTE — ED Notes (Signed)
Pt vomited small amt while standing during ortho vitals

## 2013-11-12 NOTE — H&P (Signed)
Patient Demographics  Harold Jones, is a 80 y.o. male  MRN: [id number]   DOB - 10/28/1927  Admit Date - 11/12/2013  Outpatient Primary MD for the patient is Julieanne MansonMULBERRY,ELIZABETH, MD   With History of -  Past Medical History  Diagnosis Date  . Hypertension   . Diabetes mellitus   . FIBRILLATION, ATRIAL 08/31/2002    Qualifier: Diagnosis of  By: Barbaraann Barthelankins MD, [place]    . CVA 02/28/2004    Annotation: embolic Qualifier: Diagnosis of  By: Barbaraann Barthelankins MD, [place]    . COGNITIVE DEFICITS DUE CEREBROVASCULAR DISEASE 09/09/2009    Qualifier: Diagnosis of  By: Delrae AlfredMulberry MD, Lanora ManisElizabeth    . CORONARY ARTERY DISEASE 02/27/2007    Qualifier: Diagnosis of  By: Barbaraann Barthelankins MD, [place]    . DYSLIPIDEMIA 02/27/2007    Qualifier: Diagnosis of  By: Barbaraann Barthelankins MD, [place]        Past Surgical History  Procedure Laterality Date  . Cardiac surgery      in for   Chief Complaint  Patient presents with  . Loss of Consciousness     HPI  Harold Jones  is a 80 y.o. male, atrial fibrillation on anticoagulation, type 2 diabetes mellitus, hypertension, vascular dementia, CAD, CVA who [place] and does not speak any English, all review of systems in history obtained via three-way conference from a interpreter over the phone and by ER PAs interview.   According to the patient he woke up feeling fine this morning around 6 AM, he went out in his garden to water his flowers, he then came in and was preparing some tea in the kitchen when he started to feel that his temperature was high, he felt warm and lightheaded, subsequently fell on the kitchen floor hitting his face, he never lost consciousness, he did not have any bowel or bladder incontinence or other seizure-like activity, no tongue bite, he had profuse bleeding from his nose,  swallowed up much of the blood, EMS was called, in the ambulance he threw up some swallowed blood, in the ER workup was consistent with nasal fracture with massive nosebleed, and I was called to admit the patient for syncopal workup.   He currently denies any headache, no problems vision or hearing, denies any chest pain palpitations cough phlegm shortness of breath, denies any abdominal pain, denies any nausea emesis prior to syncope, denies passing any blood in stool or black colored stool in the last few days, no focal weakness.    Review of Systems    In addition to the HPI above,  No Fever-chills, No Headache, No changes with Vision or hearing, No problems swallowing food or Liquids, No Chest pain, Cough or Shortness of Breath, No Abdominal pain, mild nausea Nausea and a small emesis which contained swallowed blood, Bowel movements are regular, No Blood in stool or Urine, No dysuria, No new skin rashes or bruises, No new joints pains-aches, except pain at the nasal  fracture site No new weakness, tingling, numbness in any extremity,  No recent weight gain or loss, No polyuria, polydypsia or polyphagia, No significant Mental Stressors.  A full 10 point Review of Systems was done, except as stated above, all other Review of Systems were negative.   Social History History  Substance Use Topics  . Smoking status: Never Smoker   . Smokeless tobacco: Never Used  . Alcohol Use: No      Family History No history of colon cancer or GI cancer  Prior to Admission medications   Medication Sig Start Date End Date Taking? Authorizing Provider  donepezil (ARICEPT) 5 MG tablet Take 5 mg by mouth at bedtime.    Historical Provider, MD  enalapril (VASOTEC) 20 MG tablet Take 20 mg by mouth daily.    Historical Provider, MD  furosemide (LASIX) 20 MG tablet Take 20 mg by mouth daily.    Historical Provider, MD  gabapentin (NEURONTIN) 300 MG capsule Take 1 capsule (300 mg total) by mouth 2  (two) times daily. 10/12/11 10/11/12  Tora KindredMarianne L York, PA-C  metFORMIN (GLUCOPHAGE) 500 MG tablet Take 500 mg by mouth 2 (two) times daily with a meal.    Historical Provider, MD  pravastatin (PRAVACHOL) 40 MG tablet Take 40 mg by mouth daily.    Historical Provider, MD  Rivaroxaban (XARELTO) 15 MG TABS tablet Take 15 mg by mouth daily.    Historical Provider, MD    No Known Allergies  Physical Exam  Vitals  Blood pressure 167/87, pulse 66, temperature 97.4 F (36.3 C), temperature source Oral, resp. rate 14, SpO2 96.00%.   1. General elderly white male sitting up in hospital bed, in mild distress due to pain at the nasal fracture site.  2. Normal affect and insight, Not Suicidal or Homicidal, Awake Alert, Oriented X 3.  3. No F.N deficits, ALL C.Nerves Intact, Strength 5/5 all 4 extremities, Sensation intact all 4 extremities, Plantars down going.  4. Has a large nasal packing with hematoma around the nose and under both eyes, Conjunctivae clear, PERRLA. Moist Oral Mucosa.  5. Supple Neck, No JVD, No cervical lymphadenopathy appriciated, No Carotid Bruits appreciated, past history of right carotid bruit.  6. Symmetrical Chest wall movement, Good air movement bilaterally, CTAB.  7. RRR, No Gallops, Rubs or Murmurs, No Parasternal Heave.  8. Positive Bowel Sounds, Abdomen Soft, Non tender, No organomegaly appriciated,No rebound -guarding or rigidity.  9.  No Cyanosis, Normal Skin Turgor, No Skin Rash or Bruise.  10. Good muscle tone,  joints appear normal , no effusions, Normal ROM.  11. No Palpable Lymph Nodes in Neck or Axillae     Data Review  CBC  Recent Labs Lab 11/12/13 1110  WBC 7.9  HGB 13.5  HCT 39.0  PLT 140*  MCV 88.6  MCH 30.7  MCHC 34.6  RDW 12.9  LYMPHSABS 0.5*  MONOABS 0.4  EOSABS 0.0  BASOSABS 0.0   ------------------------------------------------------------------------------------------------------------------  Chemistries   Recent  Labs Lab 11/12/13 1110  NA 139  K 4.3  CL 101  CO2 25  GLUCOSE 226*  BUN 16  CREATININE 0.85  CALCIUM 9.7  AST 18  ALT 9  ALKPHOS 39  BILITOT 1.0   ------------------------------------------------------------------------------------------------------------------ CrCl is unknown because both a height and weight (above a minimum accepted value) are required for this calculation. ------------------------------------------------------------------------------------------------------------------ No results found for this basename: TSH, T4TOTAL, FREET3, T3FREE, THYROIDAB,  in the last 72 hours   Coagulation profile  Recent Labs Lab  11/12/13 1110  INR 1.23   ------------------------------------------------------------------------------------------------------------------- No results found for this basename: DDIMER,  in the last 72 hours -------------------------------------------------------------------------------------------------------------------  Cardiac Enzymes  Recent Labs Lab 11/12/13 1110  TROPONINI <0.30   ------------------------------------------------------------------------------------------------------------------ No components found with this basename: POCBNP,    ---------------------------------------------------------------------------------------------------------------  Urinalysis    Component Value Date/Time   COLORURINE YELLOW 11/12/2013 1206   APPEARANCEUR CLEAR 11/12/2013 1206   LABSPEC 1.016 11/12/2013 1206   PHURINE 7.0 11/12/2013 1206   GLUCOSEU 250* 11/12/2013 1206   HGBUR SMALL* 11/12/2013 1206   BILIRUBINUR NEGATIVE 11/12/2013 1206   KETONESUR 15* 11/12/2013 1206   PROTEINUR >300* 11/12/2013 1206   UROBILINOGEN 0.2 11/12/2013 1206   NITRITE NEGATIVE 11/12/2013 1206   LEUKOCYTESUR NEGATIVE 11/12/2013 1206    ----------------------------------------------------------------------------------------------------------------  Imaging results:   Ct  Head Wo Contrast  11/12/2013   CLINICAL DATA:  Fall, facial injury  EXAM: CT HEAD WITHOUT CONTRAST  CT MAXILLOFACIAL WITHOUT CONTRAST  TECHNIQUE: Multidetector CT imaging of the head and maxillofacial structures were performed using the standard protocol without intravenous contrast. Multiplanar CT image reconstructions of the maxillofacial structures were also generated.  COMPARISON:  CT head dated 06/23/2013  FINDINGS: CT HEAD FINDINGS  No evidence of parenchymal hemorrhage or extra-axial fluid collection. No mass lesion, mass effect, or midline shift.  No CT evidence of acute infarction.  Subcortical white matter and periventricular small vessel ischemic changes, particularly in the subcortical right frontal lobe.  Mild age related atrophy.  No ventriculomegaly.  The visualized paranasal sinuses are essentially clear. The mastoid air cells are unopacified.  No evidence of calvarial fracture.  CT MAXILLOFACIAL FINDINGS  Acute bilateral nasal bone fractures with overlying soft tissue swelling.  Anterior nasal septum is also likely fractured. Hemorrhage within the right anterior nares without definite nasal septal hematoma.  The visualized paranasal sinuses are essentially clear. The mastoid air cells are unopacified.  Bilateral orbits, including the globes and retroconal soft tissues, are within normal limits.  Bilateral mild tibial condyles are well-seated in the TMJ is.  Cervical spine is within normal limits C2-3.  IMPRESSION: No evidence of acute intracranial abnormality. Atrophy with small vessel ischemic changes.  Acute bilateral nasal bone fractures with overlying soft tissue swelling.  Suspected fracture of the anterior nasal septum. No definite nasal septal hematoma.   Electronically Signed   By: Charline Bills M.D.   On: 11/12/2013 12:03   Dg Chest Port 1 View  11/12/2013   CLINICAL DATA:  Syncope yesterday, fell and hit head, history of hypertension and diabetes  EXAM: PORTABLE CHEST - 1 VIEW   COMPARISON:  DG CHEST 1V PORT dated 06/23/2013; DG CHEST 2 VIEW dated 10/08/2011; DG CHEST 1V PORT dated 09/09/2007  FINDINGS: Moderate cardiac enlargement. Mild vascular congestion with mild diffuse interstitial prominence. No consolidation or effusion.  IMPRESSION: Congestive heart failure with mild interstitial pulmonary edema   Electronically Signed   By: Esperanza Heir M.D.   On: 11/12/2013 13:38   Ct Maxillofacial Wo Cm  11/12/2013   CLINICAL DATA:  Fall, facial injury  EXAM: CT HEAD WITHOUT CONTRAST  CT MAXILLOFACIAL WITHOUT CONTRAST  TECHNIQUE: Multidetector CT imaging of the head and maxillofacial structures were performed using the standard protocol without intravenous contrast. Multiplanar CT image reconstructions of the maxillofacial structures were also generated.  COMPARISON:  CT head dated 06/23/2013  FINDINGS: CT HEAD FINDINGS  No evidence of parenchymal hemorrhage or extra-axial fluid collection. No mass lesion, mass effect, or midline shift.  No CT evidence  of acute infarction.  Subcortical white matter and periventricular small vessel ischemic changes, particularly in the subcortical right frontal lobe.  Mild age related atrophy.  No ventriculomegaly.  The visualized paranasal sinuses are essentially clear. The mastoid air cells are unopacified.  No evidence of calvarial fracture.  CT MAXILLOFACIAL FINDINGS  Acute bilateral nasal bone fractures with overlying soft tissue swelling.  Anterior nasal septum is also likely fractured. Hemorrhage within the right anterior nares without definite nasal septal hematoma.  The visualized paranasal sinuses are essentially clear. The mastoid air cells are unopacified.  Bilateral orbits, including the globes and retroconal soft tissues, are within normal limits.  Bilateral mild tibial condyles are well-seated in the TMJ is.  Cervical spine is within normal limits C2-3.  IMPRESSION: No evidence of acute intracranial abnormality. Atrophy with small vessel  ischemic changes.  Acute bilateral nasal bone fractures with overlying soft tissue swelling.  Suspected fracture of the anterior nasal septum. No definite nasal septal hematoma.   Electronically Signed   By: Charline Bills M.D.   On: 11/12/2013 12:03    My personal review of EKG: Rhythm Afib, Rate  70 /min, few PVCs, no Acute ST changes    Assessment & Plan    1.Syncope with fall - Nasal fracture - Epistaxis - his syncopal episode sounds more of a vasovagal even, he does sensation of flushing and warmth, nothing to suggest a seizure, will keep him 24 hours on telemetry, echogram carotid duplex and orthostatics, cycle troponins. Head CT is unremarkable except for nasal fracture.   2. Atrial fibrillation on anticoagulation with xaralto. Has nasal fracture with facial hematoma and large nosebleed, his throwing up some aspirated blood. For now I will hold his anticoagulation for the next several days, type screen, monitor H&H. He is not on any rate controlling agent will order IV Lopressor as needed.   3. Diabetes mellitus type 2. Was on Glucophage, check A1c, place on sliding scale insulin and low carb diet.    4. Hypertension. In poor control due to nausea and pain at the nasal fracture site. Continue home medications, will add Norvasc and as needed IV hydralazine along with IV Lopressor.   5. History of aortic valve placement. Per patient this sounds like bovine valve, he's not on Coumadin. Echo reviewed where there is mention of presumed bovine valve. No acute issues repeat echogram and monitor.   6. History of CVA and mild vascular dementia. At for delirium, for now we'll continue home medications which include Aricept. Avoid benzos minimize narcotics.    7. Dyslipidemia. Home dose statin continued.    8. History of right carotid bruit. I could not appreciate on exam however patient wasn't able to cooperate well as he could not follow my commands due to language barrier,  obtaining carotid ultrasound.     DVT Prophylaxis   SCDs    AM Labs Ordered, also please review Full Orders  Family Communication: Admission, patients condition and plan of care including tests being ordered have been discussed with the patient  who indicates understanding and agree with the plan and Code Status. Left message on phone number [phone].  Code Status Full  Likely DC to Home  Condition Fair  Time spent in minutes : 55    Leroy Sea M.D on 11/12/2013 at 3:26 PM  Between 7am to 7pm - Pager - [phone]  After 7pm go to [email or web address] - password [email or web address]  And look for the night coverage person covering me after hours  [place]  [phone]

## 2013-11-12 NOTE — ED Notes (Signed)
Per family request I left a message @ [phone] with pts room number, family requesting to be contacted at [phone] [facility] or [phone] [facility] with room assignment

## 2013-11-12 NOTE — ED Provider Notes (Signed)
CSN: [id number]     Arrival date & time 11/12/13  1034 History   First MD Initiated Contact with Patient 11/12/13 1044     Chief Complaint  Patient presents with  . Loss of Consciousness     (Consider location/radiation/quality/duration/timing/severity/associated sxs/prior Treatment) Patient is a 80 y.o. male presenting with syncope. The history is provided by the patient, a relative and medical records. The history is limited by a language barrier. A language interpreter was used.  Loss of Consciousness  Hx limited by language barrier, speaks Bosnian only. This is an 80 y.o. F with PMH significant for HTN, DM, AFIB on xarelto, presenting to the ED for witnessed syncope earlier this morning at his home.  Patient states he woke up around 0600 earlier this morning, walked to the kitchen to make himself a cup of tea. States afterwards he went to wash the cup, felt lightheaded and fell to the ground on his stomach, hitting his face on the floor, unknown LOC. Pt states his nose started to bleed which has continued on arrival to ED.  Pt lives with daughter, walks unassisted at baseline.  Per daughter present at bedside, he has some baseline dementia and is often confused at date/time.  She states he has had syncopal episodes in the past, 1 of which she thinks resulted in a stroke.  Currently patient denies any headache, neck pain, dizziness, lightheadedness, numbness/paresthesias of extremities, weakness, or changes in speech but does not some nausea.  Pt had 3 episodes non-bloody, non-bilious emesis en route. No abdominal pain or diarrhea. Denies chest pain or SOB.  Date of last tetanus unknown.  Pt hypertensive on arrival, VS otherwise stable.  Past Medical History  Diagnosis Date  . Hypertension   . Diabetes mellitus    Past Surgical History  Procedure Laterality Date  . Cardiac surgery     No family history on file. History  Substance Use Topics  . Smoking status: Never Smoker   .  Smokeless tobacco: Never Used  . Alcohol Use:     Review of Systems  Cardiovascular: Positive for syncope.      Allergies  Review of patient's allergies indicates no known allergies.  Home Medications   Prior to Admission medications   Medication Sig Start Date End Date Taking? Authorizing Provider  donepezil (ARICEPT) 5 MG tablet Take 5 mg by mouth at bedtime.    Historical Provider, MD  enalapril (VASOTEC) 20 MG tablet Take 20 mg by mouth daily.    Historical Provider, MD  furosemide (LASIX) 20 MG tablet Take 20 mg by mouth daily.    Historical Provider, MD  gabapentin (NEURONTIN) 300 MG capsule Take 1 capsule (300 mg total) by mouth 2 (two) times daily. 10/12/11 10/11/12  Tora Kindred York, PA-C  metFORMIN (GLUCOPHAGE) 500 MG tablet Take 500 mg by mouth 2 (two) times daily with a meal.    Historical Provider, MD  pravastatin (PRAVACHOL) 40 MG tablet Take 40 mg by mouth daily.    Historical Provider, MD  Rivaroxaban (XARELTO) 15 MG TABS tablet Take 15 mg by mouth daily.    Historical Provider, MD   BP 200/74  Temp(Src) 97.3 F (36.3 C) (Oral)  Resp 21  SpO2 97%  Physical Exam  Nursing note and vitals reviewed. Constitutional: He appears well-developed and well-nourished. No distress.  HENT:  Head: Normocephalic and atraumatic.  Right Ear: Tympanic membrane and ear canal normal.  Left Ear: Tympanic membrane and ear canal normal.  Nose: Mucosal edema and  sinus tenderness present. No septal deviation or nasal septal hematoma. Epistaxis is observed.  Mouth/Throat: Oropharynx is clear and moist.  Small abrasion to bridge of nose with associated bruising; bleeding from bilateral nares (L > R); no septal deviation or hematoma  Eyes: Conjunctivae and EOM are normal. Pupils are equal, round, and reactive to light.  Neck: Normal range of motion. Neck supple.  Cardiovascular: Normal rate, normal heart sounds and normal pulses.  An irregularly irregular rhythm present.  AFIB, rate  controlled  Pulmonary/Chest: Effort normal and breath sounds normal. No respiratory distress. He has no wheezes.  Well healed midline sternotomy scar  Abdominal: Soft. Bowel sounds are normal. There is no tenderness. There is no guarding.  Musculoskeletal: Normal range of motion. He exhibits no edema.  Neurological: He is alert. He has normal strength. No cranial nerve deficit or sensory deficit.  Alert and oriented to self and place; equal strength UE and LE bilaterally; CN grossly intact; moves all extremities appropriately without ataxia; sensation to light touch intact to all 4 extremoties; no focal neuro deficits or facial asymmetry appreciated  Skin: Skin is warm and dry. He is not diaphoretic.  Psychiatric: He has a normal mood and affect.    ED Course  Procedures (including critical care time) Labs Review Labs Reviewed  CBC WITH DIFFERENTIAL - Abnormal; Notable for the following:    Platelets 140 (*)    Neutrophils Relative % 88 (*)    Lymphocytes Relative 7 (*)    Lymphs Abs 0.5 (*)    All other components within normal limits  COMPREHENSIVE METABOLIC PANEL - Abnormal; Notable for the following:    Glucose, Bld 226 (*)    GFR calc non Af Amer 78 (*)    All other components within normal limits  URINALYSIS, ROUTINE W REFLEX MICROSCOPIC - Abnormal; Notable for the following:    Glucose, UA 250 (*)    Hgb urine dipstick SMALL (*)    Ketones, ur 15 (*)    Protein, ur >300 (*)    All other components within normal limits  CBG MONITORING, ED - Abnormal; Notable for the following:    Glucose-Capillary 196 (*)    All other components within normal limits  PROTIME-INR  TROPONIN I  URINE MICROSCOPIC-ADD ON  POCT CBG (FASTING - GLUCOSE)-MANUAL ENTRY  I-STAT TROPOININ, ED    Imaging Review Ct Head Wo Contrast  11/12/2013   CLINICAL DATA:  Fall, facial injury  EXAM: CT HEAD WITHOUT CONTRAST  CT MAXILLOFACIAL WITHOUT CONTRAST  TECHNIQUE: Multidetector CT imaging of the head and  maxillofacial structures were performed using the standard protocol without intravenous contrast. Multiplanar CT image reconstructions of the maxillofacial structures were also generated.  COMPARISON:  CT head dated 06/23/2013  FINDINGS: CT HEAD FINDINGS  No evidence of parenchymal hemorrhage or extra-axial fluid collection. No mass lesion, mass effect, or midline shift.  No CT evidence of acute infarction.  Subcortical white matter and periventricular small vessel ischemic changes, particularly in the subcortical right frontal lobe.  Mild age related atrophy.  No ventriculomegaly.  The visualized paranasal sinuses are essentially clear. The mastoid air cells are unopacified.  No evidence of calvarial fracture.  CT MAXILLOFACIAL FINDINGS  Acute bilateral nasal bone fractures with overlying soft tissue swelling.  Anterior nasal septum is also likely fractured. Hemorrhage within the right anterior nares without definite nasal septal hematoma.  The visualized paranasal sinuses are essentially clear. The mastoid air cells are unopacified.  Bilateral orbits, including the globes and  retroconal soft tissues, are within normal limits.  Bilateral mild tibial condyles are well-seated in the TMJ is.  Cervical spine is within normal limits C2-3.  IMPRESSION: No evidence of acute intracranial abnormality. Atrophy with small vessel ischemic changes.  Acute bilateral nasal bone fractures with overlying soft tissue swelling.  Suspected fracture of the anterior nasal septum. No definite nasal septal hematoma.   Electronically Signed   By: Charline Bills M.D.   On: 11/12/2013 12:03   Dg Chest Port 1 View  11/12/2013   CLINICAL DATA:  Syncope yesterday, fell and hit head, history of hypertension and diabetes  EXAM: PORTABLE CHEST - 1 VIEW  COMPARISON:  DG CHEST 1V PORT dated 06/23/2013; DG CHEST 2 VIEW dated 10/08/2011; DG CHEST 1V PORT dated 09/09/2007  FINDINGS: Moderate cardiac enlargement. Mild vascular congestion with mild  diffuse interstitial prominence. No consolidation or effusion.  IMPRESSION: Congestive heart failure with mild interstitial pulmonary edema   Electronically Signed   By: Esperanza Heir M.D.   On: 11/12/2013 13:38   Ct Maxillofacial Wo Cm  11/12/2013   CLINICAL DATA:  Fall, facial injury  EXAM: CT HEAD WITHOUT CONTRAST  CT MAXILLOFACIAL WITHOUT CONTRAST  TECHNIQUE: Multidetector CT imaging of the head and maxillofacial structures were performed using the standard protocol without intravenous contrast. Multiplanar CT image reconstructions of the maxillofacial structures were also generated.  COMPARISON:  CT head dated 06/23/2013  FINDINGS: CT HEAD FINDINGS  No evidence of parenchymal hemorrhage or extra-axial fluid collection. No mass lesion, mass effect, or midline shift.  No CT evidence of acute infarction.  Subcortical white matter and periventricular small vessel ischemic changes, particularly in the subcortical right frontal lobe.  Mild age related atrophy.  No ventriculomegaly.  The visualized paranasal sinuses are essentially clear. The mastoid air cells are unopacified.  No evidence of calvarial fracture.  CT MAXILLOFACIAL FINDINGS  Acute bilateral nasal bone fractures with overlying soft tissue swelling.  Anterior nasal septum is also likely fractured. Hemorrhage within the right anterior nares without definite nasal septal hematoma.  The visualized paranasal sinuses are essentially clear. The mastoid air cells are unopacified.  Bilateral orbits, including the globes and retroconal soft tissues, are within normal limits.  Bilateral mild tibial condyles are well-seated in the TMJ is.  Cervical spine is within normal limits C2-3.  IMPRESSION: No evidence of acute intracranial abnormality. Atrophy with small vessel ischemic changes.  Acute bilateral nasal bone fractures with overlying soft tissue swelling.  Suspected fracture of the anterior nasal septum. No definite nasal septal hematoma.   Electronically  Signed   By: Charline Bills M.D.   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 M with un-witnessed syncopal episode earlier today in his home-- per daughter pt has hx of same.  Pt states he fell straight forward onto his stomach and face.  He did sustain a small abrasion to his anterior nose and has bleeding from both nares, L > R.  Pt is on xarelto for AFIB.  Will obtain CT head, max/face, CXR, labs, EKG.  Nose abrasions cleansed and bacitracin applied, afrin given for nosebleed.  Tetanus updated.  Per daughter, pt is baseline oriented (some confusion regarding date/year).  EKG AFIB, rate controlled.  Labs reassuring.  CXR with some mild pulmonary edema, no other acute findings.  CT head negative for acute findings, CT max/face with bilateral nasal fxs and septal fx.  Bleeding has stopped after admin of afrin.  Given pts cardiac hx and syncopal episode today, feel he would benefit from observation and further work-up.  Discussed with Dr. Thedore MinsSingh who will admit for further management.  Garlon HatchetLisa M Yola Paradiso, PA-C 11/12/13 1501

## 2013-11-12 NOTE — Progress Notes (Signed)
Verified with Dr Thedore MinsSingh that this is not a stroke work up. No need for frequent neuro checks. Levonne Spillerhasidy Ipek Westra, RN

## 2013-11-12 NOTE — ED Provider Notes (Signed)
Medical screening examination/treatment/procedure(s) were conducted as a shared visit with non-physician practitioner(s) and myself.  I personally evaluated the patient during the encounter.   EKG Interpretation   Date/Time:  Thursday Nov 12 2013 10:46:56 EDT Ventricular Rate:  70 PR Interval:    QRS Duration: 142 QT Interval:  465 QTC Calculation: 502 R Axis:   -45 Text Interpretation:  Atrial fibrillation Left bundle branch block No  significant change since last tracing Confirmed by Nathali Vent  MD, Laure Leone  ([id number]) on 11/12/2013 11:52:16 AM      I interviewed and examined the patient. Lungs are CTAB. Cardiac exam wnl. Abdomen soft. Mild deformity to bridge of nose, no septal hematoma seen. Will plan on admission d/t syncope.   Junius ArgyleForrest S Yani Lal, MD 11/12/13 2015

## 2013-11-12 NOTE — ED Notes (Signed)
[facility], per report pt speaks [place]ian, pt had witnessed syncopal episode, unknown duration, hit head, hypertensive with 200 systolic BP, epistaxis in route, pt vomiting in route x 3, pt received Zofran in route 4 mg, A fib in route, unknown cardiac hx, pt hx of x 2 stroke, unknown neuro baseline, alert to person, follows commands, pupils equal & reactive

## 2013-11-12 NOTE — ED Notes (Signed)
PA  Lisa at bedside. 

## 2013-11-13 ENCOUNTER — Observation Stay (HOSPITAL_COMMUNITY): Payer: Medicare Other

## 2013-11-13 DIAGNOSIS — R55 Syncope and collapse: Secondary | ICD-10-CM

## 2013-11-13 DIAGNOSIS — I517 Cardiomegaly: Secondary | ICD-10-CM

## 2013-11-13 DIAGNOSIS — E119 Type 2 diabetes mellitus without complications: Secondary | ICD-10-CM

## 2013-11-13 DIAGNOSIS — I4891 Unspecified atrial fibrillation: Secondary | ICD-10-CM

## 2013-11-13 LAB — BASIC METABOLIC PANEL
BUN: 17 mg/dL (ref 6–23)
CO2: 24 mEq/L (ref 19–32)
CREATININE: 0.94 mg/dL (ref 0.50–1.35)
Calcium: 9 mg/dL (ref 8.4–10.5)
Chloride: 102 mEq/L (ref 96–112)
GFR calc Af Amer: 87 mL/min — ABNORMAL LOW (ref >90)
GFR, EST NON AFRICAN AMERICAN: 75 mL/min — AB (ref >90)
GLUCOSE: 137 mg/dL — AB (ref 70–99)
Potassium: 3.6 mEq/L — ABNORMAL LOW (ref 3.7–5.3)
SODIUM: 138 meq/L (ref 137–147)

## 2013-11-13 LAB — CBC
HCT: 35.5 % — ABNORMAL LOW (ref 39.0–52.0)
Hemoglobin: 12.2 g/dL — ABNORMAL LOW (ref 13.0–17.0)
MCH: 30.7 pg (ref 26.0–34.0)
MCHC: 34.4 g/dL (ref 30.0–36.0)
MCV: 89.2 fL (ref 78.0–100.0)
PLATELETS: 139 10*3/uL — AB (ref 150–400)
RBC: 3.98 MIL/uL — ABNORMAL LOW (ref 4.22–5.81)
RDW: 13 % (ref 11.5–15.5)
WBC: 5.2 10*3/uL (ref 4.0–10.5)

## 2013-11-13 LAB — GLUCOSE, CAPILLARY
GLUCOSE-CAPILLARY: 150 mg/dL — AB (ref 70–99)
Glucose-Capillary: 165 mg/dL — ABNORMAL HIGH (ref 70–99)
Glucose-Capillary: 110 mg/dL — ABNORMAL HIGH (ref 70–99)
Glucose-Capillary: 152 mg/dL — ABNORMAL HIGH (ref 70–99)
Glucose-Capillary: 129 mg/dL — ABNORMAL HIGH (ref 70–99)

## 2013-11-13 LAB — HEMOGLOBIN A1C
Hgb A1c MFr Bld: 8.2 % — ABNORMAL HIGH (ref <5.7)
Mean Plasma Glucose: 189 mg/dL — ABNORMAL HIGH (ref <117)

## 2013-11-13 LAB — TROPONIN I: Troponin I: <0.3 ng/mL (ref <0.30)

## 2013-11-13 MED ORDER — RIVAROXABAN 15 MG PO TABS
15.0000 mg | ORAL_TABLET | Freq: Every day | ORAL | Status: DC
  Administered 2013-11-13: 15 mg via ORAL
  Filled 2013-11-13 (×2): qty 1

## 2013-11-13 MED ORDER — POTASSIUM CHLORIDE CRYS ER 20 MEQ PO TBCR
40.0000 meq | EXTENDED_RELEASE_TABLET | Freq: Two times a day (BID) | ORAL | Status: DC
  Administered 2013-11-13 – 2013-11-14 (×3): 40 meq via ORAL
  Filled 2013-11-13 (×4): qty 2

## 2013-11-13 MED ORDER — FUROSEMIDE 10 MG/ML IJ SOLN
40.0000 mg | Freq: Once | INTRAMUSCULAR | Status: AC
  Administered 2013-11-13: 40 mg via INTRAVENOUS
  Filled 2013-11-13: qty 4

## 2013-11-13 NOTE — Progress Notes (Signed)
OT Cancellation Note  Patient Details Name: Harold Jones MRN: [id number] DOB: 09/06/1927   Cancelled Treatment:    Reason Eval/Treat Not Completed: OT screened, no needs identified, will sign off. General order for splint received, non specific.  No splint needs identified by OT, RN or MD (Dr. York SpanielBuriev). Please contact this therapist if in error.  Dayton BailiffJulie Lynn Kathyleen Radice 11/13/2013, 8:58 AM [phone]

## 2013-11-13 NOTE — Progress Notes (Signed)
UR completed 

## 2013-11-13 NOTE — Progress Notes (Signed)
Echocardiogram 2D Echocardiogram has been performed.  Genene ChurnJames M Danyon Mcginness 11/13/2013, 8:59 AM

## 2013-11-13 NOTE — Consult Note (Signed)
Electrophysiologic consultation note  Reason for Consult: Bradycardia, chronic atrial fibrillation, left bundle branch block, and near syncope  Referring Physician: Dr. York Spaniel  Harold Jones is an 80 y.o. male.   HPI: The patient is an 80 year old man with multiple medical problems including coronary disease, status post bypass surgery, chronic atrial fibrillation, left bundle branch block, hypertension, and dyslipidemia. He was in his usual state of health when he presented after falling and breaking his nose. He was in the kitchen when this occurred. Since he has been in the hospital, he has remained in atrial fibrillation with a slow ventricular response, and notes mild fatigue. He notes that he never had an episode like this in the past. Review of his electrocardiograms demonstrates that he has been in atrial fibrillation for at least 2 years. During this event, he denies frank syncope.  PMH: Past Medical History  Diagnosis Date  . Hypertension   . Diabetes mellitus   . FIBRILLATION, ATRIAL 08/31/2002    Qualifier: Diagnosis of  By: Barbaraann Barthel MD, [place]    . CVA 02/28/2004    Annotation: embolic Qualifier: Diagnosis of  By: Barbaraann Barthel MD, [place]    . COGNITIVE DEFICITS DUE CEREBROVASCULAR DISEASE 09/09/2009    Qualifier: Diagnosis of  By: Delrae Alfred MD, Lanora Manis    . CORONARY ARTERY DISEASE 02/27/2007    Qualifier: Diagnosis of  By: Barbaraann Barthel MD, [place]    . DYSLIPIDEMIA 02/27/2007    Qualifier: Diagnosis of  By: Barbaraann Barthel MD, Ronnald Collum: Past Surgical History  Procedure Laterality Date  . Cardiac surgery      FAMHX: No family history of premature coronary disease or sudden cardiac death.  Social History:  reports that he has never smoked. He has never used smokeless tobacco. He reports that he does not drink alcohol or use illicit drugs. he is from [place] and speaks no English  Allergies: No Known Allergies  Medications: All medications reviewed  Ct Head Wo  Contrast  11/12/2013   CLINICAL DATA:  Fall, facial injury  EXAM: CT HEAD WITHOUT CONTRAST  CT MAXILLOFACIAL WITHOUT CONTRAST  TECHNIQUE: Multidetector CT imaging of the head and maxillofacial structures were performed using the standard protocol without intravenous contrast. Multiplanar CT image reconstructions of the maxillofacial structures were also generated.  COMPARISON:  CT head dated 06/23/2013  FINDINGS: CT HEAD FINDINGS  No evidence of parenchymal hemorrhage or extra-axial fluid collection. No mass lesion, mass effect, or midline shift.  No CT evidence of acute infarction.  Subcortical white matter and periventricular small vessel ischemic changes, particularly in the subcortical right frontal lobe.  Mild age related atrophy.  No ventriculomegaly.  The visualized paranasal sinuses are essentially clear. The mastoid air cells are unopacified.  No evidence of calvarial fracture.  CT MAXILLOFACIAL FINDINGS  Acute bilateral nasal bone fractures with overlying soft tissue swelling.  Anterior nasal septum is also likely fractured. Hemorrhage within the right anterior nares without definite nasal septal hematoma.  The visualized paranasal sinuses are essentially clear. The mastoid air cells are unopacified.  Bilateral orbits, including the globes and retroconal soft tissues, are within normal limits.  Bilateral mild tibial condyles are well-seated in the TMJ is.  Cervical spine is within normal limits C2-3.  IMPRESSION: No evidence of acute intracranial abnormality. Atrophy with small vessel ischemic changes.  Acute bilateral nasal bone fractures with overlying soft tissue swelling.  Suspected fracture of the anterior nasal septum. No definite nasal septal hematoma.   Electronically Signed   By:  Charline BillsSriyesh  Krishnan M.D.   On: 11/12/2013 12:03   Dg Chest Port 1 View  11/12/2013   CLINICAL DATA:  Syncope yesterday, fell and hit head, history of hypertension and diabetes  EXAM: PORTABLE CHEST - 1 VIEW  COMPARISON:   DG CHEST 1V PORT dated 06/23/2013; DG CHEST 2 VIEW dated 10/08/2011; DG CHEST 1V PORT dated 09/09/2007  FINDINGS: Moderate cardiac enlargement. Mild vascular congestion with mild diffuse interstitial prominence. No consolidation or effusion.  IMPRESSION: Congestive heart failure with mild interstitial pulmonary edema   Electronically Signed   By: Esperanza Heiraymond  Rubner M.D.   On: 11/12/2013 13:38   Ct Maxillofacial Wo Cm  11/12/2013   CLINICAL DATA:  Fall, facial injury  EXAM: CT HEAD WITHOUT CONTRAST  CT MAXILLOFACIAL WITHOUT CONTRAST  TECHNIQUE: Multidetector CT imaging of the head and maxillofacial structures were performed using the standard protocol without intravenous contrast. Multiplanar CT image reconstructions of the maxillofacial structures were also generated.  COMPARISON:  CT head dated 06/23/2013  FINDINGS: CT HEAD FINDINGS  No evidence of parenchymal hemorrhage or extra-axial fluid collection. No mass lesion, mass effect, or midline shift.  No CT evidence of acute infarction.  Subcortical white matter and periventricular small vessel ischemic changes, particularly in the subcortical right frontal lobe.  Mild age related atrophy.  No ventriculomegaly.  The visualized paranasal sinuses are essentially clear. The mastoid air cells are unopacified.  No evidence of calvarial fracture.  CT MAXILLOFACIAL FINDINGS  Acute bilateral nasal bone fractures with overlying soft tissue swelling.  Anterior nasal septum is also likely fractured. Hemorrhage within the right anterior nares without definite nasal septal hematoma.  The visualized paranasal sinuses are essentially clear. The mastoid air cells are unopacified.  Bilateral orbits, including the globes and retroconal soft tissues, are within normal limits.  Bilateral mild tibial condyles are well-seated in the TMJ is.  Cervical spine is within normal limits C2-3.  IMPRESSION: No evidence of acute intracranial abnormality. Atrophy with small vessel ischemic changes.   Acute bilateral nasal bone fractures with overlying soft tissue swelling.  Suspected fracture of the anterior nasal septum. No definite nasal septal hematoma.   Electronically Signed   By: Charline BillsSriyesh  Krishnan M.D.   On: 11/12/2013 12:03    ROS  As stated in the HPI and negative for all other systems.  Physical Exam  Vitals:Blood pressure 115/63, pulse 51, temperature 97.6 F (36.4 C), temperature source Oral, resp. rate 17, height 5\' 11"  (1.803 m), weight 194 lb 6.4 oz (88.179 kg), SpO2 93.00%.  Chronically ill appearing elderly man, NAD HEENT: Unremarkable Neck:  7 cm JVD, no thyromegally Back:  No CVA tenderness Lungs:  Clear with no wheezes, rales, or rhonchi. HEART:  IRegular bradycardia rhythm, no murmurs, no rubs, no clicks Abd:  Flat, positive bowel sounds, no organomegally, no rebound, no guarding Ext:  2 plus pulses, no edema, no cyanosis, no clubbing Skin:  No rashes no nodules Neuro:  CN II through XII intact, motor grossly intact  Echo - preserved left ventricular function ECG - atrial fibrillation with a slow ventricular response and left bundle branch block  Assessment/Plan: 1. Near syncope with a fall 2. Atrial fibrillation 3. Left bundle branch block 4. Hypertension 5. Prior stroke 6. Chronic diastolic heart failure 7. Facial trauma, anticoagulation held Recommendation: By way of the interpreter, I have discussed treatment options with the patient. I suspect he has had bradycardia as the cause of his syncope but this has not been shown clearly. I would recommend  watchful waiting. If overnight the patient remained stable, he can be allowed to be discharged home after walking in the halls on a monitor. I would like to see him back in the office. He notes that he has never had an episode like this before. While I cannot prove that the patient actually passed out, my suspicion is such. If he has no additional symptomatic bradycardia, he could be discharged home, off of all  AV nodal blocking drugs. I have considered insertion of a loop recorder, but would at this point recommend watchful waiting. Ultimately, permanent pacemaker insertion will be likely.  Doylene CanningGregg W TaylorMD 11/13/2013, 6:04 PM

## 2013-11-13 NOTE — Progress Notes (Addendum)
[facility] PROGRESS NOTE  Harold Jones WUJ:[id number] DOB: 17-Nov-1927 DOA: 11/12/2013 PCP: Julieanne Manson, MD  Assessment/Plan: 80 y/o male with PMH fo CAD s/p CABG, s/p AVR, HTN, DM, h/o CVA, PAF on AC (Bosnian origin) presented with Leg weakness, and fall, facial bone trauma   1. Fall ? syncope, with leg weakness of unclear etiology; no LOC; neuro exam no focal at this time; CT head: no acute findings, but nasal bone fracture -obtain MRI head; PT eval; bradyarrythmia on tele; ? Sinus node dysfunction; c/s EP  2. PAF on AC/xarelto;  -episode of bradyarrhythmia not on BB' ? Sinus node dysfunction; obtain EP eval for PPM eval  -xarelto was on hold due to severe bleeding, aspirating blood;  -no further bleeding; d/w ENT okay to resume xarelto, will monitor   3. Diabetes mellitus type 2. Was on Glucophage, check A1c 8.4, place on sliding scale insulin and low carb diet.   4. HTN uncontrolled on admit; improving; add Norvasc and as needed IV hydralazine  5. History of aortic valve placement. Per patient this sounds like bovine valve, he's not on Coumadin. Echo reviewed where there is mention of presumed bovine valve. No acute issues repeat echogram and monitor.   6. History of CVA and mild vascular dementia. At risk  for delirium, for now we'll continue home medications which include Aricept. Avoid benzos minimize narcotics.  7. ? Chronic CHF; mild congested; CXR: edema  -on home diuretics; give 40 mg lasix x 1; pend echo; daily I/O, weight;   8. Nasal bone fracture; need ENT follow up';  -d/w Dr. Suszanne Conners ENT, who recommended to f/u outpatient in 2 weeks; can resume xarelto 5/15  Code Status: full Family Communication:  D/w patient, his grandchild in law [place] [phone] (indicate person spoken with, relationship, and if by phone, the number) Disposition Plan: pend PT eval    Consultants:  EP  Procedures:  Echo pend   Antibiotics:  none (indicate start date, and stop  date if known)  HPI/Subjective: alert  Objective: Filed Vitals:   11/13/13 1024  BP: 120/58  Pulse:   Temp:   Resp:     Intake/Output Summary (Last 24 hours) at 11/13/13 1221 Last data filed at 11/13/13 0801  Gross per 24 hour  Intake      3 ml  Output    875 ml  Net   -872 ml   Filed Weights   11/13/13 0600  Weight: 88.179 kg (194 lb 6.4 oz)    Exam:   General:  alert  Cardiovascular: s1,s2 rrr  Respiratory: few LL crackles   Abdomen: soft, nt,nd   Musculoskeletal: no LE edema   Data Reviewed: Basic Metabolic Panel:  Recent Labs Lab 11/12/13 1110 11/13/13 0448  NA 139 138  K 4.3 3.6*  CL 101 102  CO2 25 24  GLUCOSE 226* 137*  BUN 16 17  CREATININE 0.85 0.94  CALCIUM 9.7 9.0   Liver Function Tests:  Recent Labs Lab 11/12/13 1110  AST 18  ALT 9  ALKPHOS 39  BILITOT 1.0  PROT 7.5  ALBUMIN 3.9   No results found for this basename: LIPASE, AMYLASE,  in the last 168 hours No results found for this basename: AMMONIA,  in the last 168 hours CBC:  Recent Labs Lab 11/12/13 1110 11/12/13 1905 11/12/13 2236 11/13/13 0448  WBC 7.9  --   --  5.2  NEUTROABS 7.0  --   --   --   HGB 13.5 12.6* 12.0* 12.2*  HCT 39.0 36.0* 34.4* 35.5*  MCV 88.6  --   --  89.2  PLT 140*  --   --  139*   Cardiac Enzymes:  Recent Labs Lab 11/12/13 1110 11/12/13 1630 11/12/13 2041 11/13/13 0214  TROPONINI <0.30 <0.30 <0.30 <0.30   BNP (last 3 results) No results found for this basename: PROBNP,  in the last 8760 hours CBG:  Recent Labs Lab 11/12/13 1119 11/12/13 1626 11/12/13 2313 11/13/13 0732 11/13/13 1154  GLUCAP 196* 196* 165* 150* 110*    No results found for this or any previous visit (from the past 240 hour(s)).   Studies: Ct Head Wo Contrast  11/12/2013   CLINICAL DATA:  Fall, facial injury  EXAM: CT HEAD WITHOUT CONTRAST  CT MAXILLOFACIAL WITHOUT CONTRAST  TECHNIQUE: Multidetector CT imaging of the head and maxillofacial structures  were performed using the standard protocol without intravenous contrast. Multiplanar CT image reconstructions of the maxillofacial structures were also generated.  COMPARISON:  CT head dated 06/23/2013  FINDINGS: CT HEAD FINDINGS  No evidence of parenchymal hemorrhage or extra-axial fluid collection. No mass lesion, mass effect, or midline shift.  No CT evidence of acute infarction.  Subcortical white matter and periventricular small vessel ischemic changes, particularly in the subcortical right frontal lobe.  Mild age related atrophy.  No ventriculomegaly.  The visualized paranasal sinuses are essentially clear. The mastoid air cells are unopacified.  No evidence of calvarial fracture.  CT MAXILLOFACIAL FINDINGS  Acute bilateral nasal bone fractures with overlying soft tissue swelling.  Anterior nasal septum is also likely fractured. Hemorrhage within the right anterior nares without definite nasal septal hematoma.  The visualized paranasal sinuses are essentially clear. The mastoid air cells are unopacified.  Bilateral orbits, including the globes and retroconal soft tissues, are within normal limits.  Bilateral mild tibial condyles are well-seated in the TMJ is.  Cervical spine is within normal limits C2-3.  IMPRESSION: No evidence of acute intracranial abnormality. Atrophy with small vessel ischemic changes.  Acute bilateral nasal bone fractures with overlying soft tissue swelling.  Suspected fracture of the anterior nasal septum. No definite nasal septal hematoma.   Electronically Signed   By: Charline BillsSriyesh  Krishnan M.D.   On: 11/12/2013 12:03   Dg Chest Port 1 View  11/12/2013   CLINICAL DATA:  Syncope yesterday, fell and hit head, history of hypertension and diabetes  EXAM: PORTABLE CHEST - 1 VIEW  COMPARISON:  DG CHEST 1V PORT dated 06/23/2013; DG CHEST 2 VIEW dated 10/08/2011; DG CHEST 1V PORT dated 09/09/2007  FINDINGS: Moderate cardiac enlargement. Mild vascular congestion with mild diffuse interstitial  prominence. No consolidation or effusion.  IMPRESSION: Congestive heart failure with mild interstitial pulmonary edema   Electronically Signed   By: Esperanza Heiraymond  Rubner M.D.   On: 11/12/2013 13:38   Ct Maxillofacial Wo Cm  11/12/2013   CLINICAL DATA:  Fall, facial injury  EXAM: CT HEAD WITHOUT CONTRAST  CT MAXILLOFACIAL WITHOUT CONTRAST  TECHNIQUE: Multidetector CT imaging of the head and maxillofacial structures were performed using the standard protocol without intravenous contrast. Multiplanar CT image reconstructions of the maxillofacial structures were also generated.  COMPARISON:  CT head dated 06/23/2013  FINDINGS: CT HEAD FINDINGS  No evidence of parenchymal hemorrhage or extra-axial fluid collection. No mass lesion, mass effect, or midline shift.  No CT evidence of acute infarction.  Subcortical white matter and periventricular small vessel ischemic changes, particularly in the subcortical right frontal lobe.  Mild age related atrophy.  No ventriculomegaly.  The visualized paranasal sinuses are essentially clear. The mastoid air cells are unopacified.  No evidence of calvarial fracture.  CT MAXILLOFACIAL FINDINGS  Acute bilateral nasal bone fractures with overlying soft tissue swelling.  Anterior nasal septum is also likely fractured. Hemorrhage within the right anterior nares without definite nasal septal hematoma.  The visualized paranasal sinuses are essentially clear. The mastoid air cells are unopacified.  Bilateral orbits, including the globes and retroconal soft tissues, are within normal limits.  Bilateral mild tibial condyles are well-seated in the TMJ is.  Cervical spine is within normal limits C2-3.  IMPRESSION: No evidence of acute intracranial abnormality. Atrophy with small vessel ischemic changes.  Acute bilateral nasal bone fractures with overlying soft tissue swelling.  Suspected fracture of the anterior nasal septum. No definite nasal septal hematoma.   Electronically Signed   By: Charline BillsSriyesh   Krishnan M.D.   On: 11/12/2013 12:03    Scheduled Meds: . amLODipine  10 mg Oral Daily  . donepezil  5 mg Oral QHS  . enalapril  20 mg Oral Daily  . furosemide  40 mg Intravenous Once  . gabapentin  300 mg Oral BID  . insulin aspart  0-15 Units Subcutaneous TID WC  . insulin aspart  0-5 Units Subcutaneous QHS  . pantoprazole (PROTONIX) IV  40 mg Intravenous Q12H  . simvastatin  10 mg Oral q1800  . sodium chloride  3 mL Intravenous Q12H   Continuous Infusions:   Principal Problem:   Syncope and collapse Active Problems:   DYSLIPIDEMIA   HYPERTENSION   CORONARY ARTERY DISEASE   FIBRILLATION, ATRIAL   CVA   COGNITIVE DEFICITS DUE CEREBROVASCULAR DISEASE   ASCENDING AORTIC ANEURYSM   CAROTID BRUIT, RIGHT   AORTIC VALVE REPLACEMENT, HX OF   Diabetes mellitus, type 2   Nasal fracture    Time spent: >35 minutes     Esperanza SheetsUlugbek N Gwendolyn Mclees  [facility] Pager [phone]. If 7PM-7AM, please contact night-coverage at [email or web address], password [email or web address] 11/13/2013, 12:21 PM  LOS: 1 day

## 2013-11-13 NOTE — Progress Notes (Signed)
VASCULAR LAB PRELIMINARY  PRELIMINARY  PRELIMINARY  PRELIMINARY  Carotid duplex  completed.    Preliminary report:  Bilateral:  1-39% ICA stenosis.  Vertebral artery flow is antegrade.      Gara KronerHelene F Cecelia Graciano, RVT 11/13/2013, 2:02 PM

## 2013-11-14 LAB — BASIC METABOLIC PANEL
BUN: 23 mg/dL (ref 6–23)
CO2: 25 mEq/L (ref 19–32)
Calcium: 9.2 mg/dL (ref 8.4–10.5)
Chloride: 105 mEq/L (ref 96–112)
Creatinine, Ser: 1.37 mg/dL — ABNORMAL HIGH (ref 0.50–1.35)
GFR calc non Af Amer: 46 mL/min — ABNORMAL LOW (ref >90)
GFR, EST AFRICAN AMERICAN: 53 mL/min — AB (ref >90)
GLUCOSE: 153 mg/dL — AB (ref 70–99)
POTASSIUM: 4.5 meq/L (ref 3.7–5.3)
Sodium: 141 mEq/L (ref 137–147)

## 2013-11-14 LAB — GLUCOSE, CAPILLARY
GLUCOSE-CAPILLARY: 142 mg/dL — AB (ref 70–99)
Glucose-Capillary: 167 mg/dL — ABNORMAL HIGH (ref 70–99)

## 2013-11-14 MED ORDER — GLIPIZIDE 5 MG PO TABS: 2.5000 mg | ORAL_TABLET | Freq: Every day | ORAL | Status: DC

## 2013-11-14 NOTE — Care Management Note (Signed)
    Page 1 of 1   11/14/2013     4:16:00 PM CARE MANAGEMENT NOTE 11/14/2013  Patient:  Harold Jones,Harold Jones   Account Number:  [id number]  Date Initiated:  11/14/2013  Documentation initiated by:  [facility] CenterJEFFRIES,Nihaal Friesen  Subjective/Objective Assessment:   adm: Loss of Consciousness     Action/Plan:   discharge planning   Anticipated DC Date:  11/14/2013   Anticipated DC Plan:  HOME W HOME HEALTH SERVICES      DC Planning Services  CM consult      [facility]   Choice offered to / List presented to:  C-4 Adult Children        HH arranged  [facility] PT      [facility]   Status of service:  Completed, signed off Medicare Important Message given?  YES (If response is "NO", the following Medicare IM given date fields will be blank) Date Medicare IM given:  11/12/2013 Date Additional Medicare IM given:    Discharge Disposition:  HOME W HOME HEALTH SERVICES  Per UR Regulation:    If discussed at Long Length of Stay Meetings, dates discussed:    Comments:  11/14/13 14:00 CM spokw with pt and pt's daughter for choice of home health agency.  Pt's daughter chooses [facility] to render her father's HHPT service.  No DME requested. Address and contact information verified with daughter of pt.  [facility] is very [facility] (and [facility]  [facility] Corrie DandyMary.  No other CM needs were communicated.  Freddy Jakschsarah Shuntay Everetts, BSN, CM [phone].

## 2013-11-14 NOTE — Discharge Instructions (Signed)
Please follow up with Dr. Lewayne Buntingaylor Gregg in 1 week  please follow up with Dr. Suszanne Connerseoh in 2 weeks

## 2013-11-14 NOTE — Discharge Summary (Addendum)
Physician Discharge Summary  Harold Jones ZOX:[id number] DOB: 08/31/1927 DOA: 11/12/2013  PCP: Harold MansonMULBERRY,ELIZABETH, MD  Admit date: 11/12/2013 Discharge date: 11/14/2013  Time spent: >35 minutes  Recommendations for Outpatient Follow-up:  HHC F/u with cardiology 1 week F/u with ENT in 2 weeks F/u with PCP in 1-2 weeks as needed  Discharge Diagnoses:  Principal Problem:   Syncope and collapse Active Problems:   DYSLIPIDEMIA   HYPERTENSION   CORONARY ARTERY DISEASE   FIBRILLATION, ATRIAL   CVA   COGNITIVE DEFICITS DUE CEREBROVASCULAR DISEASE   ASCENDING AORTIC ANEURYSM   CAROTID BRUIT, RIGHT   AORTIC VALVE REPLACEMENT, HX OF   Diabetes mellitus, type 2   Nasal fracture   Discharge Condition: stable   Diet recommendation: DM  Filed Weights   11/13/13 0600 11/14/13 0549  Weight: 88.179 kg (194 lb 6.4 oz) 89.721 kg (197 lb 12.8 oz)    History of present illness:  80 y/o male with PMH fo CAD s/p CABG, s/p AVR, HTN, DM, h/o CVA, PAF on AC (Bosnian origin) presented with Leg weakness, and fall, facial bone trauma   Hospital Course:  1. Fall ? syncope, with leg weakness of unclear etiology; no LOC; neuro exam no focal at this time; CT head: no acute findings, but nasal bone fracture  -MRI head: no new CVA, but old infarcts; PT eval: HHC PT -no new episodes in the hospital  2. PAF on AC/xarelto;  -episode of bradyarrhythmia not on BB' ? Sinus node dysfunction; hold aricept with bradycardia  -patient is evaluated by EP, who recommended to f/u in his office, possible may need PPM  -xarelto was on hold due to severe bleeding, aspirating blood;  -no further bleeding; d/w ENT okay to resume xarelto 3. Diabetes mellitus type 2. Was on Glucophage, check A1c 8.4,  -hold metformin due to worse renal function; started low dose glipizide; recommended to f/u with PCP for outpatient titration as needed   4. HTN uncontrolled; improved; resume home regimen  5. History of aortic valve  placement. Per patient this sounds like bovine valve, he's not on Coumadin. Echo reviewed where there is mention of presumed bovine valve. No acute issues repeat echogram and monitor.  6. History of CVA and mild vascular dementia.  -no new CVA on MRI; hold aricept with bradycardia  7. Chronic CHF; no acute symptoms;  -resume home diuretics;  8. Nasal bone fracture; need ENT follow up';  -d/w Dr. Suszanne Connerseoh ENT, who recommended to f/u outpatient in 2 weeks; can resume xarelto 5/15    D/w patient, her daughter; they agreed with the above plan of care    Procedures: Echo: Study Conclusions  - Left ventricle: The cavity size was mildly dilated. Systolic function was vigorous. The estimated ejection fraction was in the range of 65% to 70%. Wall motion was normal; there were no regional wall motion abnormalities. - Aortic valve: Valve area: 1.09cm^2 (Vmax). - Mitral valve: Calcified annulus. Mildly thickened leaflets . - Left atrium: The atrium was moderately dilated. - Right ventricle: The cavity size was mildly dilated. Wall thickness was normal. Systolic function was normal. - Pulmonary arteries: PA peak pressure: 40mm Hg (S). - Pericardium, extracardiac: There was no pericardial effusion. - Impressions: Stable mild paravalvular leak. Mildly elevated transaortic gradients, but stable when compared to the prior study from 03/2012. Impressions:  - Stable mild paravalvular leak. Mildly elevated transaortic gradients, but stable when compared to the prior study from 03/2012.   (i.e. Studies not automatically included, echos, thoracentesis, etc;  not x-rays)  Consultations:  EP  Discharge Exam: Filed Vitals:   11/14/13 1012  BP: 135/53  Pulse:   Temp:   Resp:     General: alert Cardiovascular: s1,s2 rrr Respiratory: CTA BL  Discharge Instructions  Discharge Instructions   Diet - low sodium heart healthy    Complete by:  As directed      Discharge instructions    Complete  by:  As directed   Please follow up with Dr. Jerilynn Jones cardiology in 1 week  Please follow up with ENT in 2 weeks     Increase activity slowly    Complete by:  As directed             Medication List    STOP taking these medications       donepezil 5 MG tablet  Commonly known as:  ARICEPT     metFORMIN 500 MG tablet  Commonly known as:  GLUCOPHAGE      TAKE these medications       enalapril 20 MG tablet  Commonly known as:  VASOTEC  Take 20 mg by mouth at bedtime.     furosemide 20 MG tablet  Commonly known as:  LASIX  Take 20 mg by mouth daily.     gabapentin 300 MG capsule  Commonly known as:  NEURONTIN  Take 300 mg by mouth 2 (two) times daily.     glipiZIDE 5 MG tablet  Commonly known as:  GLUCOTROL  Take 0.5 tablets (2.5 mg total) by mouth daily before breakfast.     MYRBETRIQ 25 MG Tb24 tablet  Generic drug:  mirabegron ER  Take 25 mg by mouth daily.     pravastatin 40 MG tablet  Commonly known as:  PRAVACHOL  Take 40 mg by mouth daily.     Rivaroxaban 15 MG Tabs tablet  Commonly known as:  XARELTO  Take 15 mg by mouth daily.       No Known Allergies     Follow-up Information   Follow up with Harold Moll, MD. Schedule an appointment as soon as possible for a visit in 2 weeks.   Specialty:  Otolaryngology   Contact information:   [address] [place] [address] [phone]       Follow up with Harold Bunting, MD. Schedule an appointment as soon as possible for a visit in 1 week.   Specialty:  Cardiology   Contact information:   [address] [place] [address] [phone]        The results of significant diagnostics from this hospitalization (including imaging, microbiology, ancillary and laboratory) are listed below for reference.    Significant Diagnostic Studies: Ct Head Wo Contrast  11/12/2013   CLINICAL DATA:  Fall, facial injury  EXAM: CT HEAD WITHOUT CONTRAST  CT MAXILLOFACIAL WITHOUT  CONTRAST  TECHNIQUE: Multidetector CT imaging of the head and maxillofacial structures were performed using the standard protocol without intravenous contrast. Multiplanar CT image reconstructions of the maxillofacial structures were also generated.  COMPARISON:  CT head dated 06/23/2013  FINDINGS: CT HEAD FINDINGS  No evidence of parenchymal hemorrhage or extra-axial fluid collection. No mass lesion, mass effect, or midline shift.  No CT evidence of acute infarction.  Subcortical white matter and periventricular small vessel ischemic changes, particularly in the subcortical right frontal lobe.  Mild age related atrophy.  No ventriculomegaly.  The visualized paranasal sinuses are essentially clear. The mastoid air cells are unopacified.  No evidence of calvarial fracture.  CT MAXILLOFACIAL FINDINGS  Acute bilateral nasal bone fractures with overlying soft tissue swelling.  Anterior nasal septum is also likely fractured. Hemorrhage within the right anterior nares without definite nasal septal hematoma.  The visualized paranasal sinuses are essentially clear. The mastoid air cells are unopacified.  Bilateral orbits, including the globes and retroconal soft tissues, are within normal limits.  Bilateral mild tibial condyles are well-seated in the TMJ is.  Cervical spine is within normal limits C2-3.  IMPRESSION: No evidence of acute intracranial abnormality. Atrophy with small vessel ischemic changes.  Acute bilateral nasal bone fractures with overlying soft tissue swelling.  Suspected fracture of the anterior nasal septum. No definite nasal septal hematoma.   Electronically Signed   By: Charline BillsSriyesh  Krishnan M.D.   On: 11/12/2013 12:03   Mr Brain Wo Contrast  11/13/2013   CLINICAL DATA:  Syncope, multiple medical problems, atrial fibrillation. Leg weakness.  EXAM: MRI HEAD WITHOUT CONTRAST  TECHNIQUE: Multiplanar, multiecho pulse sequences of the brain and surrounding structures were obtained without intravenous  contrast.  COMPARISON:  CT HEAD W/O CM dated 11/12/2013  FINDINGS: No reduced diffusion to suggest acute ischemia. Punctate focus of susceptibility artifact right frontal lobe, and at the convexities, likely intraparenchymal. Punctate focus of susceptibility right inferior cerebellum.  Multiple bilateral remote cerebellar infarcts, largest in the left superior cerebellum. Innumerable tiny T2 hyperintensities within the basal ganglia, in addition to 2 subcentimeter T2 hyperintensities in left thalamus most consistent remote lacunar infarcts. Patchy to confluent supratentorial white matter T2 hyperintensities suggest chronic small vessel ischemic disease, with cortical involvement in the right frontal lobe. Tiny remote pontine infarcts. No midline shift or mass effect. Mild ventriculomegaly, likely on the basis of global parenchymal brain volume loss as there is overall commensurate enlargement of cerebral sulci and cerebellar folia.  No abnormal extra-axial fluid collections. Dolicoectatic appearance of the intracranial vessels. Trace ethmoid mucosal thickening without paranasal sinus air-fluid levels. Mastoid air cells are well aerated. Ocular globes and orbital contents are nonsuspicious, status post bilateral ocular lens implants. Moderate temporomandibular osteoarthrosis.  IMPRESSION: No acute intracranial process, specifically no evidence of acute ischemia.  Involutional changes. Multiple bilateral cerebellar remote infarcts, in addition to moderate to severe white matter changes suggesting chronic small vessel ischemic disease with remote pontine and basal ganglia/left thalamic lacunar infarcts.  Punctate foci of susceptibility artifact in addition to dolicoectatic intracranial vessels suggests sequelae of chronic hypertension.   Electronically Signed   By: Awilda Metroourtnay  Bloomer   On: 11/13/2013 22:54   Dg Chest Port 1 View  11/12/2013   CLINICAL DATA:  Syncope yesterday, fell and hit head, history of  hypertension and diabetes  EXAM: PORTABLE CHEST - 1 VIEW  COMPARISON:  DG CHEST 1V PORT dated 06/23/2013; DG CHEST 2 VIEW dated 10/08/2011; DG CHEST 1V PORT dated 09/09/2007  FINDINGS: Moderate cardiac enlargement. Mild vascular congestion with mild diffuse interstitial prominence. No consolidation or effusion.  IMPRESSION: Congestive heart failure with mild interstitial pulmonary edema   Electronically Signed   By: Esperanza Heiraymond  Rubner M.D.   On: 11/12/2013 13:38   Ct Maxillofacial Wo Cm  11/12/2013   CLINICAL DATA:  Fall, facial injury  EXAM: CT HEAD WITHOUT CONTRAST  CT MAXILLOFACIAL WITHOUT CONTRAST  TECHNIQUE: Multidetector CT imaging of the head and maxillofacial structures were performed using the standard protocol without intravenous contrast. Multiplanar CT image reconstructions of the maxillofacial structures were also generated.  COMPARISON:  CT head dated 06/23/2013  FINDINGS: CT HEAD  FINDINGS  No evidence of parenchymal hemorrhage or extra-axial fluid collection. No mass lesion, mass effect, or midline shift.  No CT evidence of acute infarction.  Subcortical white matter and periventricular small vessel ischemic changes, particularly in the subcortical right frontal lobe.  Mild age related atrophy.  No ventriculomegaly.  The visualized paranasal sinuses are essentially clear. The mastoid air cells are unopacified.  No evidence of calvarial fracture.  CT MAXILLOFACIAL FINDINGS  Acute bilateral nasal bone fractures with overlying soft tissue swelling.  Anterior nasal septum is also likely fractured. Hemorrhage within the right anterior nares without definite nasal septal hematoma.  The visualized paranasal sinuses are essentially clear. The mastoid air cells are unopacified.  Bilateral orbits, including the globes and retroconal soft tissues, are within normal limits.  Bilateral mild tibial condyles are well-seated in the TMJ is.  Cervical spine is within normal limits C2-3.  IMPRESSION: No evidence of acute  intracranial abnormality. Atrophy with small vessel ischemic changes.  Acute bilateral nasal bone fractures with overlying soft tissue swelling.  Suspected fracture of the anterior nasal septum. No definite nasal septal hematoma.   Electronically Signed   By: Charline Bills M.D.   On: 11/12/2013 12:03    Microbiology: No results found for this or any previous visit (from the past 240 hour(s)).   Labs: Basic Metabolic Panel:  Recent Labs Lab 11/12/13 1110 11/13/13 0448 11/14/13 0400  NA 139 138 141  K 4.3 3.6* 4.5  CL 101 102 105  CO2 25 24 25   GLUCOSE 226* 137* 153*  BUN 16 17 23   CREATININE 0.85 0.94 1.37*  CALCIUM 9.7 9.0 9.2   Liver Function Tests:  Recent Labs Lab 11/12/13 1110  AST 18  ALT 9  ALKPHOS 39  BILITOT 1.0  PROT 7.5  ALBUMIN 3.9   No results found for this basename: LIPASE, AMYLASE,  in the last 168 hours No results found for this basename: AMMONIA,  in the last 168 hours CBC:  Recent Labs Lab 11/12/13 1110 11/12/13 1905 11/12/13 2236 11/13/13 0448  WBC 7.9  --   --  5.2  NEUTROABS 7.0  --   --   --   HGB 13.5 12.6* 12.0* 12.2*  HCT 39.0 36.0* 34.4* 35.5*  MCV 88.6  --   --  89.2  PLT 140*  --   --  139*   Cardiac Enzymes:  Recent Labs Lab 11/12/13 1110 11/12/13 1630 11/12/13 2041 11/13/13 0214  TROPONINI <0.30 <0.30 <0.30 <0.30   BNP: BNP (last 3 results) No results found for this basename: PROBNP,  in the last 8760 hours CBG:  Recent Labs Lab 11/13/13 1154 11/13/13 1643 11/13/13 2156 11/14/13 0744 11/14/13 1146  GLUCAP 110* 152* 129* 142* 167*       Signed:  Isaiah Serge Delaney Perona  [facility] 11/14/2013, 12:12 PM

## 2013-11-14 NOTE — Progress Notes (Signed)
Utilization review complete 

## 2013-11-14 NOTE — Evaluation (Signed)
Physical Therapy Evaluation Patient Details Name: Harold Jones: [id number] DOB: 05/14/1928 Today's Date: 11/14/2013   History of Present Illness  pt presents after syncopal episode with Bradycardia.    Clinical Impression  Pt moving well despite c/o of L knee and hip pain.  Pt will have S from his wife at D/C and his children can check in on him daily.  Feel pt could return to home with HHPT.  Will continue to follow.      Follow Up Recommendations Home health PT;Supervision - Intermittent    Equipment Recommendations  None recommended by PT    Recommendations for Other Services       Precautions / Restrictions Precautions Precautions: Fall Restrictions Weight Bearing Restrictions: No      Mobility  Bed Mobility Overal bed mobility: Modified Independent             General bed mobility comments: Needs increased time.    Transfers Overall transfer level: Needs assistance Equipment used: None Transfers: Sit to/from Stand Sit to Stand: Supervision         General transfer comment: pt needs use of UEs to perform without A.    Ambulation/Gait Ambulation/Gait assistance: Min guard Ambulation Distance (Feet): 150 Feet (250) Assistive device: None Gait Pattern/deviations: Step-through pattern;Decreased stride length     General Gait Details: pt moves slowly, but without LOB.  pt indicates L hip is sore during mobility, but can not recall any injury to hip.    Stairs            Wheelchair Mobility    Modified Rankin (Stroke Patients Only)       Balance Overall balance assessment: Needs assistance         Standing balance support: No upper extremity supported Standing balance-Leahy Scale: Fair Standing balance comment: pt able to maintain balance, however needs single UE support when presented with balance challenges.                               Pertinent Vitals/Pain L knee and hip, did not rate.  RN made aware.       Home Living Family/patient expects to be discharged to:: Private residence Living Arrangements: Spouse/significant other Available Help at Discharge: Available 24 hours/day (Wife available for S 24/7, Children check on pt daily) Type of Home: Apartment Home Access: Level entry     Home Layout: One level Home Equipment: Shower seat;Cane - single point;Grab bars - tub/shower      Prior Function Level of Independence: Independent;Independent with assistive device(s)         Comments: pt indicates cane that he uses sometimes.       Hand Dominance        Extremity/Trunk Assessment   Upper Extremity Assessment: Overall WFL for tasks assessed           Lower Extremity Assessment: LLE deficits/detail   LLE Deficits / Details: pt indicates L knee and hip pain during mobility.  pt states knee pain is chronic, however hip pain is new.       Communication   Communication: Interpreter utilized;Prefers language other than English;HOH (pt speaks [place])  Cognition Arousal/Alertness: Awake/alert Behavior During Therapy: WFL for tasks assessed/performed Overall Cognitive Status: Within Functional Limits for tasks assessed                      General Comments      Exercises  Assessment/Plan    PT Assessment Patient needs continued PT services  PT Diagnosis Difficulty walking;Acute pain   PT Problem List Decreased activity tolerance;Decreased balance;Decreased mobility;Decreased knowledge of use of DME;Pain  PT Treatment Interventions DME instruction;Gait training;Functional mobility training;Therapeutic activities;Therapeutic exercise;Balance training;Patient/family education   PT Goals (Current goals can be found in the Care Plan section) Acute Rehab PT Goals Patient Stated Goal: None stated.   PT Goal Formulation: With patient Time For Goal Achievement: 11/21/13 Potential to Achieve Goals: Good    Frequency Min 3X/week   Barriers to discharge         Co-evaluation               End of Session Equipment Utilized During Treatment: Gait belt Activity Tolerance: Patient tolerated treatment well Patient left: in bed;with call bell/phone within reach;with bed alarm set Nurse Communication: Mobility status    Functional Assessment Tool Used: Clinical Judgement Functional Limitation: Mobility: Walking and moving around Mobility: Walking and Moving Around Current Status ([id number]([id number]): At least 1 percent but less than 20 percent impaired, limited or restricted Mobility: Walking and Moving Around Goal Status [phone]([id number]): 0 percent impaired, limited or restricted    Time: 0936-1010 PT Time Calculation (min): 34 min   Charges:   PT Evaluation $Initial PT Evaluation Tier I: 1 Procedure PT Treatments $Gait Training: 23-37 mins   PT G Codes:   Functional Assessment Tool Used: Clinical Judgement Functional Limitation: Mobility: Walking and moving around    W. R. BerkleyMegan F Eliel Dudding, South CarolinaPT [phone] 11/14/2013, 10:25 AM

## 2013-11-18 ENCOUNTER — Emergency Department (HOSPITAL_COMMUNITY)
Admission: EM | Admit: 2013-11-18 | Discharge: 2013-11-18 | Disposition: A | Discharge status: Home / Self Care | Payer: Medicare Other | Attending: Emergency Medicine | Admitting: Emergency Medicine

## 2013-11-18 ENCOUNTER — Encounter (HOSPITAL_COMMUNITY): Payer: Self-pay | Admitting: Emergency Medicine

## 2013-11-18 DIAGNOSIS — I2510 Atherosclerotic heart disease of native coronary artery without angina pectoris: Secondary | ICD-10-CM | POA: Insufficient documentation

## 2013-11-18 DIAGNOSIS — Z8673 Personal history of transient ischemic attack (TIA), and cerebral infarction without residual deficits: Secondary | ICD-10-CM | POA: Insufficient documentation

## 2013-11-18 DIAGNOSIS — M25559 Pain in unspecified hip: Secondary | ICD-10-CM | POA: Insufficient documentation

## 2013-11-18 DIAGNOSIS — I10 Essential (primary) hypertension: Secondary | ICD-10-CM | POA: Insufficient documentation

## 2013-11-18 DIAGNOSIS — K5900 Constipation, unspecified: Secondary | ICD-10-CM | POA: Insufficient documentation

## 2013-11-18 DIAGNOSIS — I4891 Unspecified atrial fibrillation: Secondary | ICD-10-CM | POA: Insufficient documentation

## 2013-11-18 DIAGNOSIS — Z79899 Other long term (current) drug therapy: Secondary | ICD-10-CM | POA: Insufficient documentation

## 2013-11-18 DIAGNOSIS — Z9889 Other specified postprocedural states: Secondary | ICD-10-CM | POA: Insufficient documentation

## 2013-11-18 DIAGNOSIS — E119 Type 2 diabetes mellitus without complications: Secondary | ICD-10-CM | POA: Insufficient documentation

## 2013-11-18 DIAGNOSIS — Z7901 Long term (current) use of anticoagulants: Secondary | ICD-10-CM | POA: Insufficient documentation

## 2013-11-18 DIAGNOSIS — R339 Retention of urine, unspecified: Secondary | ICD-10-CM | POA: Insufficient documentation

## 2013-11-18 DIAGNOSIS — E785 Hyperlipidemia, unspecified: Secondary | ICD-10-CM | POA: Insufficient documentation

## 2013-11-18 LAB — URINALYSIS, ROUTINE W REFLEX MICROSCOPIC
BILIRUBIN URINE: NEGATIVE
Glucose, UA: NEGATIVE mg/dL
KETONES UR: NEGATIVE mg/dL
Leukocytes, UA: NEGATIVE
Nitrite: NEGATIVE
PROTEIN: NEGATIVE mg/dL
Specific Gravity, Urine: 1.01 (ref 1.005–1.030)
UROBILINOGEN UA: 0.2 mg/dL (ref 0.0–1.0)
pH: 5.5 (ref 5.0–8.0)

## 2013-11-18 LAB — URINE MICROSCOPIC-ADD ON

## 2013-11-18 NOTE — ED Notes (Signed)
Bed: WA06 Expected date:  Expected time:  Means of arrival:  Comments: EMS-fall-constipated

## 2013-11-18 NOTE — ED Provider Notes (Signed)
CSN: [id number]     Arrival date & time 11/18/13  1425 History   First MD Initiated Contact with Patient 11/18/13 1428     Chief Complaint  Patient presents with  . Abdominal Pain    HPI  Interview conducted in [place] with interpreter phone.  80 y.o. male with h/o atrial fibrillation here with lower abdominal pain since yesterday. He reports he has not had a stool in 3 days or voided since yesterday. His lower abdomen is 10/10 sharp pain. He has not had smyptoms like this in the past. He denies fevers, chills, nausea, vomiting, chest pain, or dyspnea. He is able to eat and drink. He was recently in the hospital after syncope with unclear etiology, head CT with no intracranial findings but showing nasal bone fracture, nonacute MRI; otherwise, denies new medication, leg pain/swelling, or other complaints.    Past Medical History  Diagnosis Date  . Hypertension   . Diabetes mellitus   . FIBRILLATION, ATRIAL 08/31/2002    Qualifier: Diagnosis of  By: Barbaraann Barthelankins MD, [place]    . CVA 02/28/2004    Annotation: embolic Qualifier: Diagnosis of  By: Barbaraann Barthelankins MD, [place]    . COGNITIVE DEFICITS DUE CEREBROVASCULAR DISEASE 09/09/2009    Qualifier: Diagnosis of  By: Delrae AlfredMulberry MD, Lanora ManisElizabeth    . CORONARY ARTERY DISEASE 02/27/2007    Qualifier: Diagnosis of  By: Barbaraann Barthelankins MD, [place]    . DYSLIPIDEMIA 02/27/2007    Qualifier: Diagnosis of  By: Barbaraann Barthelankins MD, [place]     Past Surgical History  Procedure Laterality Date  . Cardiac surgery     No family history on file. History  Substance Use Topics  . Smoking status: Never Smoker   . Smokeless tobacco: Never Used  . Alcohol Use: No    Review of Systems  All other systems reviewed and are negative.   Allergies  Review of patient's allergies indicates no known allergies.  Home Medications   Prior to Admission medications   Medication Sig Start Date End Date Taking? Authorizing Provider  enalapril (VASOTEC) 20 MG tablet Take 20 mg by mouth at  bedtime.     Historical Provider, MD  furosemide (LASIX) 20 MG tablet Take 20 mg by mouth daily.    Historical Provider, MD  gabapentin (NEURONTIN) 300 MG capsule Take 300 mg by mouth 2 (two) times daily.    Historical Provider, MD  glipiZIDE (GLUCOTROL) 5 MG tablet Take 0.5 tablets (2.5 mg total) by mouth daily before breakfast. 11/14/13   Esperanza SheetsUlugbek N Buriev, MD  mirabegron ER (MYRBETRIQ) 25 MG TB24 tablet Take 25 mg by mouth daily.    Historical Provider, MD  pravastatin (PRAVACHOL) 40 MG tablet Take 40 mg by mouth daily.    Historical Provider, MD  Rivaroxaban (XARELTO) 15 MG TABS tablet Take 15 mg by mouth daily.    Historical Provider, MD   BP 180/80  Pulse 80  Temp(Src) 98.1 F (36.7 C) (Oral)  Resp 16  SpO2 99% Physical Exam GEN: NAD, pleasant, appears uncomfortable HEENT: Atraumatic, normocephalic, neck supple, EOMI, sclera clear, healing scab nasal bridge, resolving purplish ecchymoses under each eye CV: irregularly irregular, normal rate, no murmurs, rubs, or gallops PULM: CTAB, normal effort ABD: Soft, tender suprapubically with palpable firm bladder, nondistended, NABS, no organomegaly SKIN: No rash or cyanosis; warm and well-perfused EXTR: No lower extremity edema or calf tenderness; left hip mildly tender and left inner thigh mildly tender with no deformity or other abnormality on inspection PSYCH: Mood and affect  euthymic, normal rate and volume of speech NEURO: Awake, alert, no focal deficits grossly, normal speech Bladder scan revealing 8-900 cc  ED Course  Procedures (including critical care time) Labs Review Labs Reviewed  URINALYSIS, ROUTINE W REFLEX MICROSCOPIC - Abnormal; Notable for the following:    Hgb urine dipstick TRACE (*)    All other components within normal limits  URINE MICROSCOPIC-ADD ON    Imaging Review No results found.   EKG Interpretation None      MDM   Final diagnoses:  Urinary retention  Constipation   80 y.o. male with urinary  retention, catheterized in ED with immediate nearly complete relief. No fevers, chills, or other signs of infection. Discharged with foley leg bag in place and instructions provided via interpreter phone on proper foley bag care. UA negative. - Foley in place for bladder rest. - F/u with urology in 1 week for further evaluation of resolution and cause. - Pt and daughter voiced understanding. - Return precautions reviewed.  Constipation  Possibly related to bladder distension. Abdominal pain reportedly resolved with bladder decompression. Reassured patient he will likely have an easier time stooling without bladder distension.  - Instructed patient to stay hydrated and use miralax daily until stooling normally. - If he develops severe abdominal pain or fevers, he should seek immediate care.  HTN Chronic problem, likely higher due to discomfort. No chest pain or dyspnea today. - F/u with PCP.   Leona SingletonMaria T Damon Baisch, MD PGY-2, [facility]   Leona SingletonMaria T Epifania Littrell, MD 11/18/13 [phone]

## 2013-11-18 NOTE — ED Notes (Signed)
Vitals were delayed bc pt was having to run back and forth to the restroom. Pt c/o of lower abdominal pin

## 2013-11-18 NOTE — Discharge Instructions (Signed)
You had urinary retention. You are leaving the hospital with a foley catheter and bag. You should care for it as the nurse teaches you. Follow up with urology next week to remove foley and re-evaluate your urinary retention. Stay hydrated. Take miralax daily which should help with constipation. Stop taking if you develop diarrhea. Seek immediate care if you develop severe abdominal pain, fevers, or other concerns.

## 2013-11-18 NOTE — ED Notes (Addendum)
Pt speaks [place], initial hx comes translated to EMS via daughter, will update note with pt's own words.. Per EMS, pt has not had a bowel movement or urinated for the past 2 days. Pt now complains of 10/10 abdominal pain. [facility], still has bruises on face.  Pt states he has been constipated for 3 days and has not urinated today. Pt states his last normal urination was last night.

## 2013-11-20 NOTE — ED Notes (Signed)
I saw and evaluated the patient, reviewed the resident's note and I agree with the findings and plan.   EKG Interpretation None      Resolution of symptoms after Foley catheter placed.  Urinary retention.  Outpatient neurology followup.  Home with a leg bag.  Lyanne Co, MD 11/20/13 2325

## 2013-11-24 ENCOUNTER — Encounter: Payer: Self-pay | Admitting: Internal Medicine

## 2013-11-24 ENCOUNTER — Ambulatory Visit (INDEPENDENT_AMBULATORY_CARE_PROVIDER_SITE_OTHER): Payer: Medicare Other | Admitting: Internal Medicine

## 2013-11-24 VITALS — BP 151/76 | HR 54 | Ht 71.0 in | Wt 192.1 lb

## 2013-11-24 DIAGNOSIS — I2510 Atherosclerotic heart disease of native coronary artery without angina pectoris: Secondary | ICD-10-CM

## 2013-11-24 DIAGNOSIS — I10 Essential (primary) hypertension: Secondary | ICD-10-CM

## 2013-11-24 DIAGNOSIS — I4891 Unspecified atrial fibrillation: Secondary | ICD-10-CM

## 2013-11-24 NOTE — Assessment & Plan Note (Signed)
His rate is well controlled but does not appear to slow. No syncope. He will continue his anti-coagulation. No indication for PPM at this point.

## 2013-11-24 NOTE — Assessment & Plan Note (Signed)
His blood pressure is elevated. He will try and reduce his sodium intake

## 2013-11-24 NOTE — Progress Notes (Signed)
HPI Mr. Harold Jones returns today for additional evaluation. He presented to the hospital several weeks ago with a fall. He is not sure he passed out but he did break his nose. While in the hospital he was bradycardic but eventually improved after his AV nodal blocking drugs were held. He was discharged home. He has been stable in the interim. The history today is provided by the patient who speaks to his grandson [name] who is fluent in [place] and Science writer. He has felt well and denies chest pain, sob, or syncope. No Known Allergies   Current Outpatient Prescriptions  Medication Sig Dispense Refill  . enalapril (VASOTEC) 20 MG tablet Take 20 mg by mouth at bedtime.       . furosemide (LASIX) 20 MG tablet Take 20 mg by mouth daily.      Marland Kitchen gabapentin (NEURONTIN) 300 MG capsule Take 300 mg by mouth 2 (two) times daily.      Marland Kitchen glipiZIDE (GLUCOTROL) 5 MG tablet Take 2.5 mg by mouth daily before breakfast.      . mirabegron ER (MYRBETRIQ) 25 MG TB24 tablet Take 25 mg by mouth daily.      . pravastatin (PRAVACHOL) 40 MG tablet Take 40 mg by mouth daily.      . Rivaroxaban (XARELTO) 15 MG TABS tablet Take 15 mg by mouth daily.       No current facility-administered medications for this visit.     Past Medical History  Diagnosis Date  . Hypertension   . Diabetes mellitus   . FIBRILLATION, ATRIAL 08/31/2002    Qualifier: Diagnosis of  By: Barbaraann Barthel MD, [place]    . CVA 02/28/2004    Annotation: embolic Qualifier: Diagnosis of  By: Barbaraann Barthel MD, [place]    . COGNITIVE DEFICITS DUE CEREBROVASCULAR DISEASE 09/09/2009    Qualifier: Diagnosis of  By: Delrae Alfred MD, Lanora Manis    . CORONARY ARTERY DISEASE 02/27/2007    Qualifier: Diagnosis of  By: Barbaraann Barthel MD, [place]    . DYSLIPIDEMIA 02/27/2007    Qualifier: Diagnosis of  By: Barbaraann Barthel MD, [place]    . Bradycardia   . Aortic valve disorder     ROS:   All systems reviewed and negative except as noted in the HPI.   Past Surgical  History  Procedure Laterality Date  . Cardiac surgery       No family history on file.   History   Social History  . Marital Status: Married    Spouse Name: N/A    Number of Children: N/A  . Years of Education: N/A   Occupational History  . Not on file.   Social History Main Topics  . Smoking status: Never Smoker   . Smokeless tobacco: Never Used  . Alcohol Use: No  . Drug Use: No  . Sexual Activity: Not on file   Other Topics Concern  . Not on file   Social History Narrative  . No narrative on file     BP 151/76  Pulse 54  Ht 5\' 11"  (1.803 m)  Wt 192 lb 1.6 oz (87.136 kg)  BMI 26.80 kg/m2  Physical Exam:  stable appearing elderly man, NAD HEENT: Unremarkable Neck:  No JVD, no thyromegally Back:  No CVA tenderness Lungs:  Clear with no wheezes HEART:  IRegular brady rhythm, no murmurs, no rubs, no clicks Abd:  soft, positive bowel sounds, no organomegally, no rebound, no guarding Ext:  2 plus pulses, no edema, no cyanosis, no clubbing Skin:  No rashes no nodules Neuro:  CN II through XII intact, motor grossly intact  EKG - atrial fib with a slow VR and rare PVC's.  Assess/Plan:

## 2013-11-24 NOTE — Patient Instructions (Signed)
Your physician wants you to follow-up in: 6 months with Dr Taylor You will receive a reminder letter in the mail two months in advance. If you don't receive a letter, please call our office to schedule the follow-up appointment.  

## 2013-12-03 ENCOUNTER — Institutional Professional Consult (permissible substitution): Payer: Medicare Other | Admitting: Internal Medicine

## 2014-01-07 ENCOUNTER — Ambulatory Visit (INDEPENDENT_AMBULATORY_CARE_PROVIDER_SITE_OTHER): Payer: Medicare Other | Admitting: Otolaryngology

## 2014-01-07 DIAGNOSIS — J342 Deviated nasal septum: Secondary | ICD-10-CM

## 2014-01-07 DIAGNOSIS — J343 Hypertrophy of nasal turbinates: Secondary | ICD-10-CM

## 2014-01-07 DIAGNOSIS — S022XXA Fracture of nasal bones, initial encounter for closed fracture: Secondary | ICD-10-CM

## 2015-03-01 ENCOUNTER — Inpatient Hospital Stay (HOSPITAL_COMMUNITY)
Admission: EM | Admit: 2015-03-01 | Discharge: 2015-03-07 | DRG: 871 | Disposition: A | Discharge status: Skilled Nursing Facility | Payer: Medicare Other | Attending: Internal Medicine | Admitting: Internal Medicine

## 2015-03-01 ENCOUNTER — Inpatient Hospital Stay (HOSPITAL_COMMUNITY): Payer: Medicare Other

## 2015-03-01 ENCOUNTER — Encounter (HOSPITAL_COMMUNITY): Payer: Self-pay | Admitting: *Deleted

## 2015-03-01 ENCOUNTER — Emergency Department (HOSPITAL_COMMUNITY): Payer: Medicare Other

## 2015-03-01 DIAGNOSIS — W19XXXA Unspecified fall, initial encounter: Secondary | ICD-10-CM

## 2015-03-01 DIAGNOSIS — E785 Hyperlipidemia, unspecified: Secondary | ICD-10-CM | POA: Diagnosis present

## 2015-03-01 DIAGNOSIS — J189 Pneumonia, unspecified organism: Secondary | ICD-10-CM | POA: Diagnosis not present

## 2015-03-01 DIAGNOSIS — I472 Ventricular tachycardia: Secondary | ICD-10-CM | POA: Diagnosis not present

## 2015-03-01 DIAGNOSIS — R410 Disorientation, unspecified: Secondary | ICD-10-CM

## 2015-03-01 DIAGNOSIS — E119 Type 2 diabetes mellitus without complications: Secondary | ICD-10-CM | POA: Diagnosis present

## 2015-03-01 DIAGNOSIS — G9340 Encephalopathy, unspecified: Secondary | ICD-10-CM | POA: Diagnosis present

## 2015-03-01 DIAGNOSIS — I639 Cerebral infarction, unspecified: Secondary | ICD-10-CM | POA: Diagnosis not present

## 2015-03-01 DIAGNOSIS — I482 Chronic atrial fibrillation: Secondary | ICD-10-CM | POA: Diagnosis not present

## 2015-03-01 DIAGNOSIS — I10 Essential (primary) hypertension: Secondary | ICD-10-CM | POA: Diagnosis present

## 2015-03-01 DIAGNOSIS — R001 Bradycardia, unspecified: Secondary | ICD-10-CM | POA: Diagnosis not present

## 2015-03-01 DIAGNOSIS — Z7901 Long term (current) use of anticoagulants: Secondary | ICD-10-CM

## 2015-03-01 DIAGNOSIS — R4182 Altered mental status, unspecified: Secondary | ICD-10-CM | POA: Diagnosis present

## 2015-03-01 DIAGNOSIS — J690 Pneumonitis due to inhalation of food and vomit: Secondary | ICD-10-CM | POA: Diagnosis present

## 2015-03-01 DIAGNOSIS — J9601 Acute respiratory failure with hypoxia: Secondary | ICD-10-CM | POA: Diagnosis present

## 2015-03-01 DIAGNOSIS — R1312 Dysphagia, oropharyngeal phase: Secondary | ICD-10-CM | POA: Diagnosis present

## 2015-03-01 DIAGNOSIS — A411 Sepsis due to other specified staphylococcus: Principal | ICD-10-CM | POA: Diagnosis present

## 2015-03-01 DIAGNOSIS — I4891 Unspecified atrial fibrillation: Secondary | ICD-10-CM | POA: Diagnosis present

## 2015-03-01 DIAGNOSIS — W19XXXS Unspecified fall, sequela: Secondary | ICD-10-CM

## 2015-03-01 DIAGNOSIS — Z8673 Personal history of transient ischemic attack (TIA), and cerebral infarction without residual deficits: Secondary | ICD-10-CM

## 2015-03-01 DIAGNOSIS — I6350 Cerebral infarction due to unspecified occlusion or stenosis of unspecified cerebral artery: Secondary | ICD-10-CM | POA: Diagnosis present

## 2015-03-01 DIAGNOSIS — Z952 Presence of prosthetic heart valve: Secondary | ICD-10-CM

## 2015-03-01 DIAGNOSIS — M549 Dorsalgia, unspecified: Secondary | ICD-10-CM

## 2015-03-01 DIAGNOSIS — I2510 Atherosclerotic heart disease of native coronary artery without angina pectoris: Secondary | ICD-10-CM | POA: Diagnosis present

## 2015-03-01 DIAGNOSIS — E118 Type 2 diabetes mellitus with unspecified complications: Secondary | ICD-10-CM | POA: Diagnosis not present

## 2015-03-01 DIAGNOSIS — M25552 Pain in left hip: Secondary | ICD-10-CM

## 2015-03-01 LAB — COMPREHENSIVE METABOLIC PANEL
ALT: 9 U/L — AB (ref 17–63)
AST: 15 U/L (ref 15–41)
Albumin: 3.2 g/dL — ABNORMAL LOW (ref 3.5–5.0)
Alkaline Phosphatase: 25 U/L — ABNORMAL LOW (ref 38–126)
Anion gap: 7 (ref 5–15)
BUN: 17 mg/dL (ref 6–20)
CO2: 23 mmol/L (ref 22–32)
CREATININE: 1.36 mg/dL — AB (ref 0.61–1.24)
Calcium: 8.3 mg/dL — ABNORMAL LOW (ref 8.9–10.3)
Chloride: 104 mmol/L (ref 101–111)
GFR calc Af Amer: 53 mL/min — ABNORMAL LOW (ref >60)
GFR calc non Af Amer: 46 mL/min — ABNORMAL LOW (ref >60)
Glucose, Bld: 164 mg/dL — ABNORMAL HIGH (ref 65–99)
Potassium: 3.8 mmol/L (ref 3.5–5.1)
Sodium: 134 mmol/L — ABNORMAL LOW (ref 135–145)
Total Bilirubin: 1.5 mg/dL — ABNORMAL HIGH (ref 0.3–1.2)
Total Protein: 6.2 g/dL — ABNORMAL LOW (ref 6.5–8.1)

## 2015-03-01 LAB — CBG MONITORING, ED: Glucose-Capillary: 168 mg/dL — ABNORMAL HIGH (ref 65–99)

## 2015-03-01 LAB — I-STAT ARTERIAL BLOOD GAS, ED
Acid-base deficit: 4 mmol/L — ABNORMAL HIGH (ref 0.0–2.0)
BICARBONATE: 20.9 meq/L (ref 20.0–24.0)
O2 Saturation: 91 %
PCO2 ART: 35.1 mmHg (ref 35.0–45.0)
Patient temperature: 98.3
TCO2: 22 mmol/L (ref 0–100)
pH, Arterial: 7.382 (ref 7.350–7.450)
pO2, Arterial: 61 mmHg — ABNORMAL LOW (ref 80.0–100.0)

## 2015-03-01 LAB — PROCALCITONIN: PROCALCITONIN: 0.59 ng/mL

## 2015-03-01 LAB — URINALYSIS, ROUTINE W REFLEX MICROSCOPIC
BILIRUBIN URINE: NEGATIVE
Glucose, UA: NEGATIVE mg/dL
Ketones, ur: NEGATIVE mg/dL
LEUKOCYTES UA: NEGATIVE
NITRITE: NEGATIVE
PH: 5.5 (ref 5.0–8.0)
Protein, ur: 100 mg/dL — AB
Specific Gravity, Urine: 1.017 (ref 1.005–1.030)
Urobilinogen, UA: 0.2 mg/dL (ref 0.0–1.0)

## 2015-03-01 LAB — URINE MICROSCOPIC-ADD ON

## 2015-03-01 LAB — I-STAT TROPONIN, ED: TROPONIN I, POC: 0.03 ng/mL (ref 0.00–0.08)

## 2015-03-01 LAB — CBC WITH DIFFERENTIAL/PLATELET
BASOS ABS: 0 10*3/uL (ref 0.0–0.1)
Basophils Relative: 0 % (ref 0–1)
EOS PCT: 0 % (ref 0–5)
Eosinophils Absolute: 0 10*3/uL (ref 0.0–0.7)
HEMATOCRIT: 35.1 % — AB (ref 39.0–52.0)
Hemoglobin: 12.3 g/dL — ABNORMAL LOW (ref 13.0–17.0)
Lymphocytes Relative: 10 % — ABNORMAL LOW (ref 12–46)
Lymphs Abs: 0.8 10*3/uL (ref 0.7–4.0)
MCH: 31.1 pg (ref 26.0–34.0)
MCHC: 35 g/dL (ref 30.0–36.0)
MCV: 88.9 fL (ref 78.0–100.0)
Monocytes Absolute: 0.9 10*3/uL (ref 0.1–1.0)
Monocytes Relative: 12 % (ref 3–12)
NEUTROS PCT: 78 % — AB (ref 43–77)
Neutro Abs: 6 10*3/uL (ref 1.7–7.7)
PLATELETS: 121 10*3/uL — AB (ref 150–400)
RBC: 3.95 MIL/uL — AB (ref 4.22–5.81)
RDW: 12.9 % (ref 11.5–15.5)
WBC: 7.7 10*3/uL (ref 4.0–10.5)

## 2015-03-01 LAB — PROTIME-INR
INR: 3.36 — AB (ref 0.00–1.49)
Prothrombin Time: 33.3 seconds — ABNORMAL HIGH (ref 11.6–15.2)

## 2015-03-01 LAB — LACTIC ACID, PLASMA: Lactic Acid, Venous: 1.1 mmol/L (ref 0.5–2.0)

## 2015-03-01 MED ORDER — INSULIN ASPART 100 UNIT/ML ~~LOC~~ SOLN
0.0000 [IU] | Freq: Four times a day (QID) | SUBCUTANEOUS | Status: DC
  Administered 2015-03-02: 2 [IU] via SUBCUTANEOUS
  Administered 2015-03-02: 1 [IU] via SUBCUTANEOUS
  Administered 2015-03-03 (×2): 3 [IU] via SUBCUTANEOUS
  Administered 2015-03-03 – 2015-03-04 (×3): 5 [IU] via SUBCUTANEOUS
  Administered 2015-03-04 (×2): 3 [IU] via SUBCUTANEOUS
  Administered 2015-03-04: 7 [IU] via SUBCUTANEOUS
  Administered 2015-03-05 (×2): 5 [IU] via SUBCUTANEOUS
  Administered 2015-03-05 (×2): 3 [IU] via SUBCUTANEOUS
  Administered 2015-03-06: 5 [IU] via SUBCUTANEOUS
  Administered 2015-03-06 (×3): 3 [IU] via SUBCUTANEOUS
  Administered 2015-03-07: 7 [IU] via SUBCUTANEOUS
  Administered 2015-03-07: 5 [IU] via SUBCUTANEOUS
  Administered 2015-03-07: 7 [IU] via SUBCUTANEOUS
  Administered 2015-03-07: 2 [IU] via SUBCUTANEOUS
  Filled 2015-03-01: qty 1

## 2015-03-01 MED ORDER — DEXTROSE 5 % IV SOLN
1.0000 g | INTRAVENOUS | Status: DC
  Administered 2015-03-02 (×2): 1 g via INTRAVENOUS
  Filled 2015-03-01 (×2): qty 10

## 2015-03-01 MED ORDER — TAMSULOSIN HCL 0.4 MG PO CAPS
0.4000 mg | ORAL_CAPSULE | Freq: Every day | ORAL | Status: DC
  Administered 2015-03-02 – 2015-03-07 (×6): 0.4 mg via ORAL
  Filled 2015-03-01 (×6): qty 1

## 2015-03-01 MED ORDER — SODIUM CHLORIDE 0.9 % IV BOLUS (SEPSIS)
2000.0000 mL | Freq: Once | INTRAVENOUS | Status: AC
  Administered 2015-03-01: 2000 mL via INTRAVENOUS

## 2015-03-01 MED ORDER — ACETAMINOPHEN 500 MG PO TABS
1000.0000 mg | ORAL_TABLET | Freq: Once | ORAL | Status: AC
  Administered 2015-03-01: 1000 mg via ORAL
  Filled 2015-03-01: qty 2

## 2015-03-01 MED ORDER — GABAPENTIN 300 MG PO CAPS
300.0000 mg | ORAL_CAPSULE | Freq: Two times a day (BID) | ORAL | Status: DC
  Administered 2015-03-02 – 2015-03-07 (×10): 300 mg via ORAL
  Filled 2015-03-01 (×4): qty 1
  Filled 2015-03-01: qty 3
  Filled 2015-03-01 (×7): qty 1

## 2015-03-01 MED ORDER — DEXTROSE 5 % IV SOLN
500.0000 mg | INTRAVENOUS | Status: DC
  Administered 2015-03-02: 500 mg via INTRAVENOUS
  Filled 2015-03-01 (×2): qty 500

## 2015-03-01 MED ORDER — PIPERACILLIN-TAZOBACTAM 3.375 G IVPB 30 MIN
3.3750 g | Freq: Once | INTRAVENOUS | Status: AC
  Administered 2015-03-01: 3.375 g via INTRAVENOUS
  Filled 2015-03-01: qty 50

## 2015-03-01 MED ORDER — PRAVASTATIN SODIUM 40 MG PO TABS
40.0000 mg | ORAL_TABLET | Freq: Every day | ORAL | Status: DC
  Administered 2015-03-02 – 2015-03-07 (×6): 40 mg via ORAL
  Filled 2015-03-01 (×6): qty 1

## 2015-03-01 MED ORDER — VANCOMYCIN HCL IN DEXTROSE 1-5 GM/200ML-% IV SOLN
1000.0000 mg | Freq: Once | INTRAVENOUS | Status: AC
  Administered 2015-03-01: 1000 mg via INTRAVENOUS
  Filled 2015-03-01: qty 200

## 2015-03-01 NOTE — ED Provider Notes (Signed)
CSN: [id number]     Arrival date & time 03/01/15  1817 History   First MD Initiated Contact with Patient 03/01/15 1825     Chief Complaint  Patient presents with  . Altered Mental Status     (Consider location/radiation/quality/duration/timing/severity/associated sxs/prior Treatment) The history is provided by the patient, the spouse and the EMS personnel. The history is limited by a language barrier and the condition of the patient. A language interpreter was used Conservation officer, historic buildings).  Mihailo Rarick is a 81 y.o. male hx of CVA, afib on coumadin, here with AMS. As per the granddaughter, patient has been confused since yesterday. He lives at home with his wife. He was unable to sleep last night. This morning, the granddaughter notes that he is more confused. Around 4:30 PM, the granddaughter visited her again and at that time, patient is more confused and has intelligible speech. Patient also is complaining of chest pain at that time. Patient states that his bilateral leg surgery right now but denies any injury or falls.   Level V caveat- AMS, CVA, condition of patient    Past Medical History  Diagnosis Date  . Hypertension   . Diabetes mellitus   . FIBRILLATION, ATRIAL 08/31/2002    Qualifier: Diagnosis of  By: Barbaraann Barthel MD, [place]    . CVA 02/28/2004    Annotation: embolic Qualifier: Diagnosis of  By: Barbaraann Barthel MD, [place]    . COGNITIVE DEFICITS DUE CEREBROVASCULAR DISEASE 09/09/2009    Qualifier: Diagnosis of  By: Delrae Alfred MD, Lanora Manis    . CORONARY ARTERY DISEASE 02/27/2007    Qualifier: Diagnosis of  By: Barbaraann Barthel MD, [place]    . DYSLIPIDEMIA 02/27/2007    Qualifier: Diagnosis of  By: Barbaraann Barthel MD, [place]    . Bradycardia   . Aortic valve disorder    Past Surgical History  Procedure Laterality Date  . Cardiac surgery     History reviewed. No pertinent family history. Social History  Substance Use Topics  . Smoking status: Never Smoker   . Smokeless tobacco: Never Used   . Alcohol Use: No    Review of Systems  Unable to perform ROS: Mental status change      Allergies  Review of patient's allergies indicates no known allergies.  Home Medications   Prior to Admission medications   Medication Sig Start Date End Date Taking? Authorizing Provider  enalapril (VASOTEC) 20 MG tablet Take 20 mg by mouth at bedtime.    Yes Historical Provider, MD  furosemide (LASIX) 20 MG tablet Take 20 mg by mouth daily.   Yes Historical Provider, MD  gabapentin (NEURONTIN) 300 MG capsule Take 300 mg by mouth 2 (two) times daily.   Yes Historical Provider, MD  glipiZIDE (GLUCOTROL) 5 MG tablet Take 2.5 mg by mouth daily before breakfast. 11/14/13  Yes Esperanza Sheets, MD  pravastatin (PRAVACHOL) 40 MG tablet Take 40 mg by mouth daily.   Yes Historical Provider, MD  Rivaroxaban (XARELTO) 15 MG TABS tablet Take 15 mg by mouth daily.   Yes Historical Provider, MD  tamsulosin (FLOMAX) 0.4 MG CAPS capsule Take 0.4 mg by mouth daily after supper. 30 minutes after the same meal every day. 02/12/15  Yes Historical Provider, MD   BP 121/51 mmHg  Pulse 52  Temp(Src) 98.3 F (36.8 C) (Oral)  Resp 17  SpO2 98% Physical Exam  Constitutional:  Confused   HENT:  Head: Normocephalic.  MM slightly dry   Eyes: Conjunctivae are normal. Pupils are equal, round, and  reactive to light.  Neck: Normal range of motion. Neck supple.  Cardiovascular: Normal rate, regular rhythm and normal heart sounds.   Pulmonary/Chest: Effort normal.  Diminished bilateral bases   Abdominal: Soft. Bowel sounds are normal. He exhibits no distension. There is no tenderness. There is no rebound.  Musculoskeletal: Normal range of motion. He exhibits no edema or tenderness.  Neurological: He is alert.  Confused. No obvious facial droop. Moving all extremities, doesn't follow commands well.   Skin: Skin is warm and dry.  Psychiatric:  Unable   Nursing note and vitals reviewed.   ED Course  Procedures  (including critical care time) Labs Review Labs Reviewed  CBC WITH DIFFERENTIAL/PLATELET - Abnormal; Notable for the following:    RBC 3.95 (*)    Hemoglobin 12.3 (*)    HCT 35.1 (*)    Platelets 121 (*)    Neutrophils Relative % 78 (*)    Lymphocytes Relative 10 (*)    All other components within normal limits  COMPREHENSIVE METABOLIC PANEL - Abnormal; Notable for the following:    Sodium 134 (*)    Glucose, Bld 164 (*)    Creatinine, Ser 1.36 (*)    Calcium 8.3 (*)    Total Protein 6.2 (*)    Albumin 3.2 (*)    ALT 9 (*)    Alkaline Phosphatase 25 (*)    Total Bilirubin 1.5 (*)    GFR calc non Af Amer 46 (*)    GFR calc Af Amer 53 (*)    All other components within normal limits  URINALYSIS, ROUTINE W REFLEX MICROSCOPIC (NOT AT [facility]) - Abnormal; Notable for the following:    APPearance CLOUDY (*)    Hgb urine dipstick TRACE (*)    Protein, ur 100 (*)    All other components within normal limits  PROTIME-INR - Abnormal; Notable for the following:    Prothrombin Time 33.3 (*)    INR 3.36 (*)    All other components within normal limits  URINE MICROSCOPIC-ADD ON - Abnormal; Notable for the following:    Casts HYALINE CASTS (*)    All other components within normal limits  URINE CULTURE  CULTURE, BLOOD (ROUTINE X 2)  CULTURE, BLOOD (ROUTINE X 2)  I-STAT TROPOININ, ED    Imaging Review Dg Chest 2 View  03/01/2015   CLINICAL DATA:  Patient with chest pain for multiple days.  EXAM: CHEST  2 VIEW  COMPARISON:  Chest radiograph 11/12/2013  FINDINGS: Monitoring leads overlie the patient. Stable enlarged cardiac and mediastinal contours status post median sternotomy. Pulmonary vascular redistribution. Interval development of focal consolidation within the right mid lung. Bibasilar atelectasis and or scarring. Regional skeleton is unremarkable.  IMPRESSION: New consolidative opacity within the right mid lung concerning for pneumonia in the appropriate clinical setting. Followup PA  and lateral chest X-ray is recommended in 3-4 weeks following trial of antibiotic therapy to ensure resolution and exclude underlying malignancy.   Electronically Signed   By: Annia Belt M.D.   On: 03/01/2015 19:14   Ct Head Wo Contrast  03/01/2015   CLINICAL DATA:  Altered mental status for 2 days  EXAM: CT HEAD WITHOUT CONTRAST  TECHNIQUE: Contiguous axial images were obtained from the base of the skull through the vertex without intravenous contrast.  COMPARISON:  Brain MRI 11/13/2013, head CT 11/12/2013  FINDINGS: Left cerebellar encephalomalacia reidentified. Mild cortical volume loss noted with proportional ventricular prominence. Areas of periventricular white matter hypodensity are most compatible with small  vessel ischemic change. No acute hemorrhage, infarct, or mass lesion is identified. The patient is angled on the gantry. No midline shift. Stable bone deformity is noted, with interval healing since seen on prior exam 11/12/2013. No skull fracture.  IMPRESSION: No acute intracranial finding.   Electronically Signed   By: Christiana Pellant M.D.   On: 03/01/2015 19:16   I have personally reviewed and evaluated these images and lab results as part of my medical decision-making.   EKG Interpretation   Date/Time:  Tuesday March 01 2015 19:21:39 EDT Ventricular Rate:  69 PR Interval:    QRS Duration: 132 QT Interval:  452 QTC Calculation: 484 R Axis:   -48 Text Interpretation:  Atrial fibrillation Multiple ventricular premature  complexes Left bundle branch block No significant change since last  tracing Confirmed by Beaux Wedemeyer  MD, Gaylia Kassel ([id number]) on 03/01/2015 9:05:08 PM      MDM   Final diagnoses:  None    Melton Sokolov is a 81 y.o. male here with confusion, fever. Concerned for sepsis. He is on coumadin and symptoms since yesterday so outside window for TPA. Will do sepsis workup and CT head.   9:05 PM CT head showed no acute stroke. INR therapeutic. CXR showed pneumonia. Code  sepsis activated. Given abx for pneumonia. Will admit.     Richardean Canal, MD 03/01/15 2106

## 2015-03-01 NOTE — ED Notes (Signed)
ABG completed by respiratory.

## 2015-03-01 NOTE — ED Notes (Signed)
Admitting physician Allena Katz MD at bedside

## 2015-03-01 NOTE — ED Notes (Signed)
Pt to go into E43

## 2015-03-01 NOTE — Progress Notes (Addendum)
ANTIBIOTIC CONSULT NOTE - INITIAL  Pharmacy Consult for vancomycin and zosyn Indication: rule out sepsis  No Known Allergies  Patient Measurements:    Weight: 87 kg from 11/14/13  Vital Signs: Temp: 99.4 F (37.4 C) (08/30 1937) Temp Source: Oral (08/30 1937) BP: 125/58 mmHg (08/30 2015) Pulse Rate: 25 (08/30 2015) Intake/Output from previous day:   Intake/Output from this shift:    Labs: No results for input(s): WBC, HGB, PLT, LABCREA, CREATININE in the last 72 hours. CrCl cannot be calculated (Unknown ideal weight.). No results for input(s): VANCOTROUGH, VANCOPEAK, VANCORANDOM, GENTTROUGH, GENTPEAK, GENTRANDOM, TOBRATROUGH, TOBRAPEAK, TOBRARND, AMIKACINPEAK, AMIKACINTROU, AMIKACIN in the last 72 hours.   Microbiology: No results found for this or any previous visit (from the past 720 hour(s)).  Medical History: Past Medical History  Diagnosis Date  . Hypertension   . Diabetes mellitus   . FIBRILLATION, ATRIAL 08/31/2002    Qualifier: Diagnosis of  By: Barbaraann Barthel MD, [place]    . CVA 02/28/2004    Annotation: embolic Qualifier: Diagnosis of  By: Barbaraann Barthel MD, [place]    . COGNITIVE DEFICITS DUE CEREBROVASCULAR DISEASE 09/09/2009    Qualifier: Diagnosis of  By: Delrae Alfred MD, Lanora Manis    . CORONARY ARTERY DISEASE 02/27/2007    Qualifier: Diagnosis of  By: Barbaraann Barthel MD, [place]    . DYSLIPIDEMIA 02/27/2007    Qualifier: Diagnosis of  By: Barbaraann Barthel MD, [place]    . Bradycardia   . Aortic valve disorder    Assessment: 81 yo M, non-English speaking (from [place]).  In ED with confusion, CP, and altered mental status.   PMH: stroke, afib.  On xarelto 15 mg qday per med hx.  Wt from 11/14/13 = 87 kg. Spoke with pt's granddaughter - she does not know his wt.  Normalized creat cl = 40 ml/min.  Creat 1.37;    Goal of Therapy:  Vancomycin trough level 15-20 mcg/ml  Plan:  -zosyn 3.375 gm IV x 1 dose over 30 minutes, then zosyn 3.375 gm IV q8h, infuse each dose over 4  hours -vancomycin 1 gm x 1 then vancomycin 750 mg IV q12h -f/u for current weight once available - f/u renal fxn, wbc, temp, culture data - vancomycin levels as needed -f/u to see if home xarelto is ordered  Herby Abraham, Pharm.D. [phone] 03/01/2015 8:36 PM

## 2015-03-01 NOTE — ED Notes (Signed)
Attempted to call lab to get dig level. Lab did not answer. EDP notified

## 2015-03-01 NOTE — ED Notes (Signed)
Per granddaughter, pt started feeling poorly last night but became confused, complained of chest pain, had some unintelligible speech, and was unable to stand before EMS arrival. Dr. Mayford Knife is his physician.  Pt has diabetes, sees a cardiologist, and is on Coumadin. Pt is normally ambulatory and alert and oriented x4, able to perform ADLs. Phone given to Dr. Silverio Lay for further questions.

## 2015-03-01 NOTE — ED Notes (Signed)
Phlebotomy at bedside.

## 2015-03-01 NOTE — ED Notes (Signed)
Pt arrives from home via [facility] pt family states the pt has been more confused x 2days and has been warm to the touch. Pt is from [place] and speaks no [place], EMS reports it was very difficult to get a true read on what the situation was. Pt is a diabetic, and the daughter thinks he possibly has had a valve replacement and maybe a stroke in the past. EMS reports the pt has equal grip strength and no signs of a recent stroke.

## 2015-03-01 NOTE — Progress Notes (Signed)
Results reported to RN. RT advised RN to increase O2 to 4L.

## 2015-03-01 NOTE — ED Notes (Signed)
Attempted report X1

## 2015-03-02 DIAGNOSIS — J189 Pneumonia, unspecified organism: Secondary | ICD-10-CM

## 2015-03-02 DIAGNOSIS — Z954 Presence of other heart-valve replacement: Secondary | ICD-10-CM

## 2015-03-02 DIAGNOSIS — I482 Chronic atrial fibrillation: Secondary | ICD-10-CM

## 2015-03-02 DIAGNOSIS — E118 Type 2 diabetes mellitus with unspecified complications: Secondary | ICD-10-CM

## 2015-03-02 DIAGNOSIS — G9340 Encephalopathy, unspecified: Secondary | ICD-10-CM

## 2015-03-02 DIAGNOSIS — I639 Cerebral infarction, unspecified: Secondary | ICD-10-CM

## 2015-03-02 DIAGNOSIS — I10 Essential (primary) hypertension: Secondary | ICD-10-CM

## 2015-03-02 LAB — CBC WITH DIFFERENTIAL/PLATELET
Basophils Absolute: 0 10*3/uL (ref 0.0–0.1)
Basophils Relative: 0 % (ref 0–1)
EOS PCT: 1 % (ref 0–5)
Eosinophils Absolute: 0.1 10*3/uL (ref 0.0–0.7)
HEMATOCRIT: 40.6 % (ref 39.0–52.0)
Hemoglobin: 14.1 g/dL (ref 13.0–17.0)
LYMPHS ABS: 1 10*3/uL (ref 0.7–4.0)
LYMPHS PCT: 12 % (ref 12–46)
MCH: 30.9 pg (ref 26.0–34.0)
MCHC: 34.7 g/dL (ref 30.0–36.0)
MCV: 88.8 fL (ref 78.0–100.0)
MONO ABS: 0.8 10*3/uL (ref 0.1–1.0)
MONOS PCT: 9 % (ref 3–12)
NEUTROS ABS: 6.3 10*3/uL (ref 1.7–7.7)
Neutrophils Relative %: 78 % — ABNORMAL HIGH (ref 43–77)
PLATELETS: 108 10*3/uL — AB (ref 150–400)
RBC: 4.57 MIL/uL (ref 4.22–5.81)
RDW: 13.2 % (ref 11.5–15.5)
WBC: 8.1 10*3/uL (ref 4.0–10.5)

## 2015-03-02 LAB — URINE CULTURE: CULTURE: NO GROWTH

## 2015-03-02 LAB — COMPREHENSIVE METABOLIC PANEL
ALT: 12 U/L — ABNORMAL LOW (ref 17–63)
AST: 22 U/L (ref 15–41)
Albumin: 3.5 g/dL (ref 3.5–5.0)
Alkaline Phosphatase: 29 U/L — ABNORMAL LOW (ref 38–126)
Anion gap: 13 (ref 5–15)
BILIRUBIN TOTAL: 1.6 mg/dL — AB (ref 0.3–1.2)
BUN: 18 mg/dL (ref 6–20)
CHLORIDE: 101 mmol/L (ref 101–111)
CO2: 22 mmol/L (ref 22–32)
CREATININE: 1.26 mg/dL — AB (ref 0.61–1.24)
Calcium: 8.4 mg/dL — ABNORMAL LOW (ref 8.9–10.3)
GFR, EST AFRICAN AMERICAN: 58 mL/min — AB (ref >60)
GFR, EST NON AFRICAN AMERICAN: 50 mL/min — AB (ref >60)
Glucose, Bld: 162 mg/dL — ABNORMAL HIGH (ref 65–99)
POTASSIUM: 4.1 mmol/L (ref 3.5–5.1)
Sodium: 136 mmol/L (ref 135–145)
TOTAL PROTEIN: 7.1 g/dL (ref 6.5–8.1)

## 2015-03-02 LAB — LACTIC ACID, PLASMA
LACTIC ACID, VENOUS: 3.2 mmol/L — AB (ref 0.5–2.0)
Lactic Acid, Venous: 1.7 mmol/L (ref 0.5–2.0)

## 2015-03-02 LAB — EXPECTORATED SPUTUM ASSESSMENT W GRAM STAIN, RFLX TO RESP C

## 2015-03-02 LAB — GRAM STAIN

## 2015-03-02 LAB — GLUCOSE, CAPILLARY: Glucose-Capillary: 136 mg/dL — ABNORMAL HIGH (ref 65–99)

## 2015-03-02 LAB — PROTIME-INR
INR: 2.2 — ABNORMAL HIGH (ref 0.00–1.49)
PROTHROMBIN TIME: 24.2 s — AB (ref 11.6–15.2)

## 2015-03-02 LAB — EXPECTORATED SPUTUM ASSESSMENT W REFEX TO RESP CULTURE

## 2015-03-02 LAB — STREP PNEUMONIAE URINARY ANTIGEN: Strep Pneumo Urinary Antigen: NEGATIVE

## 2015-03-02 LAB — MAGNESIUM: MAGNESIUM: 1.4 mg/dL — AB (ref 1.7–2.4)

## 2015-03-02 LAB — MRSA PCR SCREENING: MRSA by PCR: NEGATIVE

## 2015-03-02 MED ORDER — ACETAMINOPHEN 325 MG PO TABS
650.0000 mg | ORAL_TABLET | Freq: Four times a day (QID) | ORAL | Status: DC | PRN
  Administered 2015-03-02: 650 mg via ORAL
  Filled 2015-03-02: qty 2

## 2015-03-02 MED ORDER — CETYLPYRIDINIUM CHLORIDE 0.05 % MT LIQD
7.0000 mL | Freq: Two times a day (BID) | OROMUCOSAL | Status: DC
  Administered 2015-03-02 – 2015-03-07 (×11): 7 mL via OROMUCOSAL

## 2015-03-02 MED ORDER — HYDRALAZINE HCL 20 MG/ML IJ SOLN
10.0000 mg | Freq: Four times a day (QID) | INTRAMUSCULAR | Status: DC | PRN
  Administered 2015-03-02 – 2015-03-06 (×7): 10 mg via INTRAVENOUS
  Filled 2015-03-02 (×7): qty 1

## 2015-03-02 MED ORDER — ACETAMINOPHEN 650 MG RE SUPP
650.0000 mg | Freq: Four times a day (QID) | RECTAL | Status: DC | PRN
Start: 2015-03-02 — End: 2015-03-07

## 2015-03-02 MED ORDER — PIPERACILLIN-TAZOBACTAM 3.375 G IVPB: 3.3750 g | Freq: Three times a day (TID) | INTRAVENOUS | Status: DC

## 2015-03-02 MED ORDER — RIVAROXABAN 15 MG PO TABS
15.0000 mg | ORAL_TABLET | Freq: Every day | ORAL | Status: DC
  Administered 2015-03-02 – 2015-03-07 (×6): 15 mg via ORAL
  Filled 2015-03-02 (×7): qty 1

## 2015-03-02 MED ORDER — VANCOMYCIN HCL IN DEXTROSE 750-5 MG/150ML-% IV SOLN: 750.0000 mg | Freq: Two times a day (BID) | INTRAVENOUS | Status: DC

## 2015-03-02 MED ORDER — PIPERACILLIN-TAZOBACTAM 3.375 G IVPB
3.3750 g | Freq: Three times a day (TID) | INTRAVENOUS | Status: DC
  Filled 2015-03-02 (×2): qty 50

## 2015-03-02 MED ORDER — VANCOMYCIN HCL IN DEXTROSE 750-5 MG/150ML-% IV SOLN
750.0000 mg | Freq: Two times a day (BID) | INTRAVENOUS | Status: DC
  Filled 2015-03-02 (×2): qty 150

## 2015-03-02 NOTE — Progress Notes (Signed)
ANTICOAGULATION CONSULT NOTE - Initial Consult  Pharmacy Consult for Continuation of Xarelto Indication: atrial fibrillation  No Known Allergies  Patient Measurements: Weight: 199 lb 1.2 oz (90.3 kg)  Vital Signs: Temp: 98.3 F (36.8 C) (08/31 0126) Temp Source: Oral (08/31 0126) BP: 135/53 mmHg (08/30 2330) Pulse Rate: 59 (08/30 2230)  Labs:  Recent Labs  03/01/15 1937  HGB 12.3*  HCT 35.1*  PLT 121*  LABPROT 33.3*  INR 3.36*  CREATININE 1.36*    CrCl cannot be calculated (Unknown ideal weight.).   Medical History: Past Medical History  Diagnosis Date  . Hypertension   . Diabetes mellitus   . FIBRILLATION, ATRIAL 08/31/2002    Qualifier: Diagnosis of  By: Barbaraann Barthel MD, [place]    . CVA 02/28/2004    Annotation: embolic Qualifier: Diagnosis of  By: Barbaraann Barthel MD, [place]    . COGNITIVE DEFICITS DUE CEREBROVASCULAR DISEASE 09/09/2009    Qualifier: Diagnosis of  By: Delrae Alfred MD, Lanora Manis    . CORONARY ARTERY DISEASE 02/27/2007    Qualifier: Diagnosis of  By: Barbaraann Barthel MD, [place]    . DYSLIPIDEMIA 02/27/2007    Qualifier: Diagnosis of  By: Barbaraann Barthel MD, [place]    . Bradycardia   . Aortic valve disorder      Assessment: 81 y/o M here with altered mental status, on Xarelto PTA for afib, Hgb 12.3, noted renal dysfunction.   Goal of Therapy:  Monitor platelets by anticoagulation protocol: Yes   Plan:  -Continue Xarelto 15 mg daily with supper -Minimum q72h CBC while inpatient -Monitor for bleeding  Abran Duke 03/02/2015,1:30 AM

## 2015-03-02 NOTE — Progress Notes (Signed)
CRITICAL VALUE ALERT  Critical value received:  Lactic acid 3.2  Date of notification:  03/02/2015  Time of notification:  0305  Critical value read back: yes  Nurse who received alert:  Scherrie Bateman RN  MD notified (1st page):  Craige Cotta  Time of first page:    MD notified (2nd page):  Time of second page:  Responding MD:   Time MD responded:

## 2015-03-02 NOTE — Evaluation (Signed)
Physical Therapy Evaluation Patient Details Name: Harold Jones MRN: [id number] DOB: [birth year] Today's Date: 03/02/2015   History of Present Illness  Harold Jones is a 81 y.o. male with Past meical history of hypertension, diabetes mellitus, atrial fibrillation, [place] VASC 4, coronary artery disease, CVA aortic replacement.Presented to ED with confusion.  Positive for CAP.    Clinical Impression  Pt admitted with above diagnosis. Pt currently with functional limitations due to the deficits listed below (see PT Problem List). Pt was able to ambulate around the bed with RW with slight unsteady gait.  Will need 24 hour supervision/A initially and per chart wife can assist but needs to be clarified with family.  Will follow acutely.   Pt will benefit from skilled PT to increase their independence and safety with mobility to allow discharge to the venue listed below.      Follow Up Recommendations Home health PT;Supervision/Assistance - 24 hour    Equipment Recommendations  Rolling walker with 5" wheels    Recommendations for Other Services       Precautions / Restrictions Precautions Precautions: Fall Restrictions Weight Bearing Restrictions: No      Mobility  Bed Mobility Overal bed mobility: Needs Assistance;+2 for physical assistance Bed Mobility: Supine to Sit     Supine to sit: Min guard     General bed mobility comments: Needed incr time to come to EOB.   Transfers Overall transfer level: Needs assistance Equipment used: Rolling walker (2 wheeled) Transfers: Sit to/from Stand Sit to Stand: Min assist         General transfer comment: Needs steadying assist for stability initially.   Ambulation/Gait Ambulation/Gait assistance: Min guard Ambulation Distance (Feet): 25 Feet Assistive device: Rolling walker (2 wheeled) Gait Pattern/deviations: Step-through pattern;Decreased stride length;Drifts right/left   Gait velocity interpretation: Below normal speed  for age/gender General Gait Details: Noted that pt wet upon standing.  Walked pt around bed and obtained washclothes and linens and cleaned pt with total assist and changed his gown.  Interpreter used and states that pt seemed to understand why PT was assisting him with changing linens.  Pt fairly steady with  ambulation with RW short distance around bed.    Stairs            Wheelchair Mobility    Modified Rankin (Stroke Patients Only)       Balance Overall balance assessment: Needs assistance;History of Falls Sitting-balance support: No upper extremity supported;Feet supported Sitting balance-Leahy Scale: Good Sitting balance - Comments: Pt can withstand challenges to balance in sitting.     Standing balance support: No upper extremity supported;During functional activity   Standing balance comment: Pt stood to be cleaned without UE support up to 2 minutes to be cleaned with cloth and change gown. Cannot accept challenges but could stand without physical assist.                               Pertinent Vitals/Pain Pain Assessment: No/denies pain  69-84bpm, 96% 2L, 187/76 with nursing made aware.     Home Living Family/patient expects to be discharged to:: Private residence Living Arrangements: Spouse/significant other;Children Available Help at Discharge: Available 24 hours/day Type of Home: Apartment Home Access: Level entry     Home Layout: One level Home Equipment: Shower seat;Cane - single point;Grab bars - tub/shower Additional Comments: Interpreter utilized [place] [phone].  Pt did not give above information as he states he didn't understand why  interpreter was aslking him these questions.  All info above from previous admission.      Prior Function Level of Independence: Independent;Independent with assistive device(s)         Comments: used cane at times     Hand Dominance        Extremity/Trunk Assessment   Upper Extremity Assessment:  Defer to OT evaluation           Lower Extremity Assessment: Generalized weakness         Communication   Communication: Interpreter utilized;Prefers language other than English (pt spe[place])  Cognition Arousal/Alertness: Awake/alert Behavior During Therapy: WFL for tasks assessed/performed Overall Cognitive Status: Impaired/Different from baseline Area of Impairment: Following commands;Safety/judgement;Orientation Orientation Level: Disoriented to;Time;SituationPoor awareness of safety at times.      Following Commands: Follows one step commands inconsistently Safety/Judgement: Decreased awareness of safety     General Comments: Pt appears confused at times with interpreter even commenting that she felt pt confused.      General Comments      Exercises        Assessment/Plan    PT Assessment Patient needs continued PT services  PT Diagnosis Generalized weakness   PT Problem List Decreased activity tolerance;Decreased balance;Decreased mobility;Decreased knowledge of use of DME;Decreased safety awareness;Decreased knowledge of precautions  PT Treatment Interventions DME instruction;Gait training;Functional mobility training;Therapeutic activities;Therapeutic exercise;Balance training;Patient/family education   PT Goals (Current goals can be found in the Care Plan section) Acute Rehab PT Goals Patient Stated Goal: to get better PT Goal Formulation: With patient Time For Goal Achievement: 03/16/15 Potential to Achieve Goals: Good    Frequency Min 3X/week   Barriers to discharge        Co-evaluation               End of Session Equipment Utilized During Treatment: Gait belt;Oxygen Activity Tolerance: Patient limited by fatigue Patient left: in chair;with call bell/phone within reach;with chair alarm set Nurse Communication: Mobility status;Need for lift equipment         Time: 0920-0953 PT Time Calculation (min) (ACUTE ONLY): 33  min   Charges:   PT Evaluation $Initial PT Evaluation Tier I: 1 Procedure PT Treatments $Gait Training: 8-22 mins   PT G CodesBerline Lopes Mar 15, 2015, 1:04 PM Oluwadamilola Deliz,PT Acute Rehabilitation [phone] (pager)

## 2015-03-02 NOTE — Progress Notes (Signed)
[facility] PROGRESS NOTE   Harold Jones WGN:[id number] DOB: [birth year] DOA: 03/01/2015 PCP: Julieanne Manson, MD  HPI/Subjective: Spoke with patient with interpreter over the phone. Patient is [place] does not speak any [place]. Even with interpreter patient was not able to provide specific answers,? Confusion  Assessment/Plan: Principal Problem:   CAP (community acquired pneumonia) Active Problems:   Essential hypertension   FIBRILLATION, ATRIAL   Cerebral artery occlusion with cerebral infarction   H/O aortic valve replacement   Encephalopathy acute   Diabetes mellitus, type 2   Acute respiratory failure Presented with shortness of breath and hypoxia. Patient required 4 L in the emergency department to keep his saturation above 90%. This is likely secondary to the community-acquired pneumonia, wean oxygen as tolerated.  Community acquired versus aspiration pneumonia Presented with right-sided chest pain, SOB and productive cough of yellow sputum Questionable aspiration, patient reported choking episodes at home. Started on Zosyn and vancomycin. SLP to evaluate.  Sepsis Patient presented to the hospital with temperature 102.5, RR of 22 and presence of pneumonia. Patient treated with broad-spectrum antibiotics Zosyn and vancomycin. Treated with hydration with IV fluids.  Atrial fibrillation, chronic anticoagulation. Since the patient is therapeutic anticoagulation possibility of PE is less likely. Continuing home medication patient is becoming bradycardic occasionally. Monitor on stepdown unit.  Diabetes mellitus type 2. Holding oral hypoglycemic agents and placing the patient on sliding scale insulin.  Essential hypertension. Continue home medication.  Confusion Likely secondary to the sepsis, hypoxia and acute illness. at the present evaluation the patient appears well oriented. Patient did have a fall earlier but a CT scan of the head is  unremarkable for any active bleeding. Also he complained of some left hip pain although the x-rays also unremarkable.  Code Status: Full Code Family Communication: Plan discussed with the patient. Disposition Plan: Remains inpatient Diet: Diet NPO time specified  Consultants:  None  Procedures:  None  Antibiotics:  Zosyn and vancomycin.   Objective: Filed Vitals:   03/02/15 0838  BP: 156/77  Pulse: 63  Temp: 100.9 F (38.3 C)  Resp: 30    Intake/Output Summary (Last 24 hours) at 03/02/15 1111 Last data filed at 03/02/15 0102  Gross per 24 hour  Intake    250 ml  Output      0 ml  Net    250 ml   Filed Weights   03/02/15 0106  Weight: 90.3 kg (199 lb 1.2 oz)    Exam: General: Alert and awake, oriented x3, not in any acute distress. HEENT: anicteric sclera, pupils reactive to light and accommodation, EOMI CVS: S1-S2 clear, no murmur rubs or gallops Chest: clear to auscultation bilaterally, no wheezing, rales or rhonchi Abdomen: soft nontender, nondistended, normal bowel sounds, no organomegaly Extremities: no cyanosis, clubbing or edema noted bilaterally Neuro: Cranial nerves II-XII intact, no focal neurological deficits  Data Reviewed: Basic Metabolic Panel:  Recent Labs Lab 03/01/15 1937 03/02/15 0730  NA 134* 136  K 3.8 4.1  CL 104 101  CO2 23 22  GLUCOSE 164* 162*  BUN 17 18  CREATININE 1.36* 1.26*  CALCIUM 8.3* 8.4*  MG  --  1.4*   Liver Function Tests:  Recent Labs Lab 03/01/15 1937 03/02/15 0730  AST 15 22  ALT 9* 12*  ALKPHOS 25* 29*  BILITOT 1.5* 1.6*  PROT 6.2* 7.1  ALBUMIN 3.2* 3.5   No results for input(s): LIPASE, AMYLASE in the last 168 hours. No results for input(s): AMMONIA in the last 168 hours. CBC:  Recent Labs Lab 03/01/15 1937 03/02/15 0730  WBC 7.7 8.1  NEUTROABS 6.0 6.3  HGB 12.3* 14.1  HCT 35.1* 40.6  MCV 88.9 88.8  PLT 121* 108*   Cardiac Enzymes: No results for input(s): CKTOTAL, CKMB, CKMBINDEX,  TROPONINI in the last 168 hours. BNP (last 3 results) No results for input(s): BNP in the last 8760 hours.  ProBNP (last 3 results) No results for input(s): PROBNP in the last 8760 hours.  CBG:  Recent Labs Lab 03/01/15 2358 03/02/15 0611  GLUCAP 168* 136*    Micro Recent Results (from the past 240 hour(s))  Blood culture (routine x 2)     Status: None (Preliminary result)   Collection Time: 03/01/15  7:37 PM  Result Value Ref Range Status   Specimen Description BLOOD RIGHT ANTECUBITAL  Final   Special Requests BOTTLES DRAWN AEROBIC AND ANAEROBIC 10CC  Final   Culture NO GROWTH < 12 HOURS  Final   Report Status PENDING  Incomplete  Blood culture (routine x 2)     Status: None (Preliminary result)   Collection Time: 03/01/15  7:43 PM  Result Value Ref Range Status   Specimen Description BLOOD RIGHT HAND  Final   Special Requests BOTTLES DRAWN AEROBIC AND ANAEROBIC 10CC  Final   Culture NO GROWTH < 12 HOURS  Final   Report Status PENDING  Incomplete  MRSA PCR Screening     Status: None   Collection Time: 03/02/15  1:53 AM  Result Value Ref Range Status   MRSA by PCR NEGATIVE NEGATIVE Final    Comment:        The GeneXpert MRSA Assay (FDA approved for NASAL specimens only), is one component of a comprehensive MRSA colonization surveillance program. It is not intended to diagnose MRSA infection nor to guide or monitor treatment for MRSA infections.   Culture, sputum-assessment     Status: None   Collection Time: 03/02/15  7:14 AM  Result Value Ref Range Status   Specimen Description EXPECTORATED SPUTUM  Final   Special Requests NONE  Final   Sputum evaluation   Final    THIS SPECIMEN IS ACCEPTABLE. RESPIRATORY CULTURE REPORT TO FOLLOW.   Report Status 03/02/2015 FINAL  Final  Gram stain     Status: None   Collection Time: 03/02/15  7:14 AM  Result Value Ref Range Status   Specimen Description SPUTUM  Final   Special Requests NONE  Final   Gram Stain   Final     ABUNDANT WBC PRESENT, PREDOMINANTLY PMN FEW GRAM POSITIVE COCCI IN PAIRS RARE GRAM POSITIVE RODS RARE GRAM NEGATIVE RODS    Report Status 03/02/2015 FINAL  Final     Studies: Dg Chest 2 View  03/01/2015   CLINICAL DATA:  Patient with chest pain for multiple days.  EXAM: CHEST  2 VIEW  COMPARISON:  Chest radiograph 11/12/2013  FINDINGS: Monitoring leads overlie the patient. Stable enlarged cardiac and mediastinal contours status post median sternotomy. Pulmonary vascular redistribution. Interval development of focal consolidation within the right mid lung. Bibasilar atelectasis and or scarring. Regional skeleton is unremarkable.  IMPRESSION: New consolidative opacity within the right mid lung concerning for pneumonia in the appropriate clinical setting. Followup PA and lateral chest X-ray is recommended in 3-4 weeks following trial of antibiotic therapy to ensure resolution and exclude underlying malignancy.   Electronically Signed   By: Annia Belt M.D.   On: 03/01/2015 19:14   Ct Head Wo Contrast  03/01/2015   CLINICAL DATA:  Altered mental status for 2 days  EXAM: CT HEAD WITHOUT CONTRAST  TECHNIQUE: Contiguous axial images were obtained from the base of the skull through the vertex without intravenous contrast.  COMPARISON:  Brain MRI 11/13/2013, head CT 11/12/2013  FINDINGS: Left cerebellar encephalomalacia reidentified. Mild cortical volume loss noted with proportional ventricular prominence. Areas of periventricular white matter hypodensity are most compatible with small vessel ischemic change. No acute hemorrhage, infarct, or mass lesion is identified. The patient is angled on the gantry. No midline shift. Stable bone deformity is noted, with interval healing since seen on prior exam 11/12/2013. No skull fracture.  IMPRESSION: No acute intracranial finding.   Electronically Signed   By: Christiana Pellant M.D.   On: 03/01/2015 19:16   Dg Hips Bilat With Pelvis 3-4 Views  03/01/2015   CLINICAL  DATA:  Question of fall. Bilateral leg pain. Confusion. Initial encounter.  EXAM: DG HIP (WITH OR WITHOUT PELVIS) 3-4V BILAT  COMPARISON:  None.  FINDINGS: There is no evidence of fracture or dislocation. Both femoral heads are seated normally within their respective acetabula. The proximal femurs appear intact. Mild degenerative change is noted at the lower lumbar spine. The sacroiliac joints are grossly unremarkable  The visualized bowel gas pattern is grossly unremarkable in appearance. Clips are seen overlying the left inguinal region.  IMPRESSION: No evidence of fracture or dislocation.   Electronically Signed   By: Roanna Raider M.D.   On: 03/01/2015 23:56    Scheduled Meds: . antiseptic oral rinse  7 mL Mouth Rinse BID  . azithromycin  500 mg Intravenous Q24H  . cefTRIAXone (ROCEPHIN)  IV  1 g Intravenous Q24H  . gabapentin  300 mg Oral BID  . insulin aspart  0-9 Units Subcutaneous Q6H  . pravastatin  40 mg Oral q1800  . rivaroxaban  15 mg Oral Q supper  . tamsulosin  0.4 mg Oral QPC supper   Continuous Infusions:      Time spent: 35 minutes    [facility]  [facility] Pager [phone] If 7PM-7AM, please contact night-coverage at [email or web address], password [email or web address] 03/02/2015, 11:11 AM  LOS: 1 day

## 2015-03-02 NOTE — Progress Notes (Signed)
OT Cancellation Note  Patient Details Name: Harold Jones MRN: [id number] DOB: [birth year]   Cancelled Treatment:    Reason Eval/Treat Not Completed: Other (comment) Speech therapy in room with pt.   Earlie Raveling OTR/L [phone] 03/02/2015, 11:11 AM

## 2015-03-02 NOTE — Evaluation (Signed)
Clinical/Bedside Swallow Evaluation Patient Details  Name: Harold Jones MRN: [id number] Date of Birth: 1927-09-10  Today's Date: 03/02/2015 Time: SLP Start Time (ACUTE ONLY): 1105 SLP Stop Time (ACUTE ONLY): 1120 SLP Time Calculation (min) (ACUTE ONLY): 15 min  Past Medical History:  Past Medical History  Diagnosis Date  . Hypertension   . Diabetes mellitus   . FIBRILLATION, ATRIAL 08/31/2002    Qualifier: Diagnosis of  By: Barbaraann Barthel MD, [place]    . CVA 02/28/2004    Annotation: embolic Qualifier: Diagnosis of  By: Barbaraann Barthel MD, [place]    . COGNITIVE DEFICITS DUE CEREBROVASCULAR DISEASE 09/09/2009    Qualifier: Diagnosis of  By: Delrae Alfred MD, Lanora Manis    . CORONARY ARTERY DISEASE 02/27/2007    Qualifier: Diagnosis of  By: Barbaraann Barthel MD, [place]    . DYSLIPIDEMIA 02/27/2007    Qualifier: Diagnosis of  By: Barbaraann Barthel MD, [place]    . Bradycardia   . Aortic valve disorder    Past Surgical History:  Past Surgical History  Procedure Laterality Date  . Cardiac surgery     HPI:      Assessment / Plan / Recommendation Clinical Impression  Swallow assessment brief and very limited due to pt's decreased cooperation, confusion with use of [name] interpreter (pt speaks [place]). Interpreter reported pt's responses were mostly unintelligible/confused/vs pt not wanting to participate. Pt sounds congested and coughing prior to po's. Agreeable for 2 sips water ending in delayed cough x 1 difficult to assess. Recommend continue NPO except neccessary meds crushed in applesauce and MBS (tomorrow), continue oral care.       Aspiration Risk   (mod-severe)    Diet Recommendation NPO except meds   Medication Administration: Crushed with puree    Other  Recommendations Oral Care Recommendations: Oral care QID   Follow Up Recommendations       Frequency and Duration min 2x/week  2 weeks   Pertinent Vitals/Pain none      Swallow Study Prior Functional Status  Type of Home:  Apartment Available Help at Discharge: Available 24 hours/day       Oral/Motor/Sensory Function Overall Oral Motor/Sensory Function:  (decr cooperation)   Ice Chips Ice chips: Not tested   Thin Liquid Thin Liquid: Impaired Presentation: Cup;Straw Oral Phase Impairments:  (none) Oral Phase Functional Implications:  (none) Pharyngeal  Phase Impairments: Cough - Delayed    Nectar Thick Nectar Thick Liquid: Not tested   Honey Thick Honey Thick Liquid: Not tested   Puree Puree:  (pt refused)   Solid      Solid:  (pt refused)       Royce Macadamia 03/02/2015,1:43 PM  Breck Coons Jordan.Ed [employer] [phone]

## 2015-03-02 NOTE — Progress Notes (Signed)
Notified family and advised to come sit with patient with patient due to confusion. Up to chair by PT with chair alarm and was trying to get out of the chair. Attempted to assist patient to bed side commode but refused. The granddaughter was called and states not able to come sit with patient because she was at work. Family and Language both admit to patient's confusion. Nurse Tech at bed for Recruitment consultant.

## 2015-03-02 NOTE — Progress Notes (Signed)
Pt bradycardic 36. Harold Jones was informed.

## 2015-03-02 NOTE — H&P (Addendum)
[facility] History and Physical  Patient: Harold Jones  MRN: [id number]  DOB: Nov 18, 1927  DOS: the patient was seen and examined on 03/01/2015 PCP: Julieanne Manson, MD  Referring physician: Dr. Silverio Lay Chief Complaint: Confusion  HPI: Harold Jones is a 81 y.o. male with Past medical history of hypertension, diabetes mellitus, atrial fibrillation, [place] VASC 8, coronary artery disease, CVA aortic replacement. The patient is presenting with complaints of confusion. History was obtained from the patient with the help of patient's son-in-law who was at bedside helping interpretation. As per the patient he has been having more cough for last 2 weeks with sputum yellow without any blood. He also had some pain on the right side of the chest. He started having feeling weak and lethargic. He was also feeling confused today and therefore he decided to come to the hospital. He had a fall 2 months ago and had some pain on his left hip but did not have any limitation to his mobility. He walks without any support. Recently denies any changes in his medication, no sick contacts, no travel anywhere. He mentions he is compliant with all his medications. Initially in the ED ER physician told me that the patient uses 4 L of oxygen at home although patient as well as son-in-law denied any home oxygen use. He also mentions since last one week he has been having choking episode while eating.  The patient is coming from home.  At his baseline ambulates without any support And is independent for most of his ADL manages his medication on his own.  Review of Systems: as mentioned in the history of present illness.  A comprehensive review of the other systems is negative.  Past Medical History  Diagnosis Date  . Hypertension   . Diabetes mellitus   . FIBRILLATION, ATRIAL 08/31/2002    Qualifier: Diagnosis of  By: Barbaraann Barthel MD, [place]    . CVA 02/28/2004    Annotation: embolic Qualifier: Diagnosis  of  By: Barbaraann Barthel MD, [place]    . COGNITIVE DEFICITS DUE CEREBROVASCULAR DISEASE 09/09/2009    Qualifier: Diagnosis of  By: Delrae Alfred MD, Lanora Manis    . CORONARY ARTERY DISEASE 02/27/2007    Qualifier: Diagnosis of  By: Barbaraann Barthel MD, [place]    . DYSLIPIDEMIA 02/27/2007    Qualifier: Diagnosis of  By: Barbaraann Barthel MD, [place]    . Bradycardia   . Aortic valve disorder    Past Surgical History  Procedure Laterality Date  . Cardiac surgery     Social History:  reports that he has never smoked. He has never used smokeless tobacco. He reports that he does not drink alcohol or use illicit drugs.  No Known Allergies  History reviewed. No pertinent family history.  Prior to Admission medications   Medication Sig Start Date End Date Taking? Authorizing Provider  enalapril (VASOTEC) 20 MG tablet Take 20 mg by mouth at bedtime.    Yes Historical Provider, MD  furosemide (LASIX) 20 MG tablet Take 20 mg by mouth daily.   Yes Historical Provider, MD  gabapentin (NEURONTIN) 300 MG capsule Take 300 mg by mouth 2 (two) times daily.   Yes Historical Provider, MD  glipiZIDE (GLUCOTROL) 5 MG tablet Take 2.5 mg by mouth daily before breakfast. 11/14/13  Yes Esperanza Sheets, MD  pravastatin (PRAVACHOL) 40 MG tablet Take 40 mg by mouth daily.   Yes Historical Provider, MD  Rivaroxaban (XARELTO) 15 MG TABS tablet Take 15 mg by mouth daily.   Yes Historical Provider, MD  tamsulosin (FLOMAX) 0.4 MG CAPS capsule Take 0.4 mg by mouth daily after supper. 30 minutes after the same meal every day. 02/12/15  Yes Historical Provider, MD    Physical Exam: Filed Vitals:   03/02/15 0126 03/02/15 0200 03/02/15 0320 03/02/15 0330  BP:  142/61 155/71   Pulse:  59 70 54  Temp: 98.3 F (36.8 C)  97.9 F (36.6 C)   TempSrc: Oral  Oral   Resp:  23 27 25   Height:      Weight:      SpO2:  97% 96% 98%    General: Alert, Awake and Oriented to Time, Place and Person. Appear in mild distress Eyes: PERRL ENT: Oral Mucosa clear  moist. Neck: no JVD Cardiovascular: S1 and S2 Present, no Murmur, Peripheral Pulses Present Respiratory: Bilateral Air entry equal and Decreased,  Right-sided Crackles, no wheezes Abdomen: Bowel Sound present, Soft and no tenderness Skin: no Rash Extremities: no Pedal edema, no calf tenderness Neurologic: Mental status AAOx3, speech normal, attention normal,  Cranial Nerves PERRL, EOM normal and present, Motor strength bilateral equal strength 5/5,  Sensation present to light touch,  Reflexes present knee and biceps, babinski negative,  Cerebellar test normal finger nose finger.  Labs on Admission:  CBC:  Recent Labs Lab 03/01/15 1937  WBC 7.7  NEUTROABS 6.0  HGB 12.3*  HCT 35.1*  MCV 88.9  PLT 121*    CMP     Component Value Date/Time   NA 134* 03/01/2015 1937   K 3.8 03/01/2015 1937   CL 104 03/01/2015 1937   CO2 23 03/01/2015 1937   GLUCOSE 164* 03/01/2015 1937   GLUCOSE 133* 05/14/2006 0728   BUN 17 03/01/2015 1937   CREATININE 1.36* 03/01/2015 1937   CALCIUM 8.3* 03/01/2015 1937   PROT 6.2* 03/01/2015 1937   ALBUMIN 3.2* 03/01/2015 1937   AST 15 03/01/2015 1937   ALT 9* 03/01/2015 1937   ALKPHOS 25* 03/01/2015 1937   BILITOT 1.5* 03/01/2015 1937   GFRNONAA 46* 03/01/2015 1937   GFRAA 53* 03/01/2015 1937    No results for input(s): LIPASE, AMYLASE in the last 168 hours.  No results for input(s): CKTOTAL, CKMB, CKMBINDEX, TROPONINI in the last 168 hours. BNP (last 3 results) No results for input(s): BNP in the last 8760 hours.  ProBNP (last 3 results) No results for input(s): PROBNP in the last 8760 hours.   Radiological Exams on Admission: Dg Chest 2 View  03/01/2015   CLINICAL DATA:  Patient with chest pain for multiple days.  EXAM: CHEST  2 VIEW  COMPARISON:  Chest radiograph 11/12/2013  FINDINGS: Monitoring leads overlie the patient. Stable enlarged cardiac and mediastinal contours status post median sternotomy. Pulmonary vascular redistribution.  Interval development of focal consolidation within the right mid lung. Bibasilar atelectasis and or scarring. Regional skeleton is unremarkable.  IMPRESSION: New consolidative opacity within the right mid lung concerning for pneumonia in the appropriate clinical setting. Followup PA and lateral chest X-ray is recommended in 3-4 weeks following trial of antibiotic therapy to ensure resolution and exclude underlying malignancy.   Electronically Signed   By: Annia Belt M.D.   On: 03/01/2015 19:14   Ct Head Wo Contrast  03/01/2015   CLINICAL DATA:  Altered mental status for 2 days  EXAM: CT HEAD WITHOUT CONTRAST  TECHNIQUE: Contiguous axial images were obtained from the base of the skull through the vertex without intravenous contrast.  COMPARISON:  Brain MRI 11/13/2013, head CT 11/12/2013  FINDINGS: Left cerebellar  encephalomalacia reidentified. Mild cortical volume loss noted with proportional ventricular prominence. Areas of periventricular white matter hypodensity are most compatible with small vessel ischemic change. No acute hemorrhage, infarct, or mass lesion is identified. The patient is angled on the gantry. No midline shift. Stable bone deformity is noted, with interval healing since seen on prior exam 11/12/2013. No skull fracture.  IMPRESSION: No acute intracranial finding.   Electronically Signed   By: Christiana Pellant M.D.   On: 03/01/2015 19:16   Dg Hips Bilat With Pelvis 3-4 Views  03/01/2015   CLINICAL DATA:  Question of fall. Bilateral leg pain. Confusion. Initial encounter.  EXAM: DG HIP (WITH OR WITHOUT PELVIS) 3-4V BILAT  COMPARISON:  None.  FINDINGS: There is no evidence of fracture or dislocation. Both femoral heads are seated normally within their respective acetabula. The proximal femurs appear intact. Mild degenerative change is noted at the lower lumbar spine. The sacroiliac joints are grossly unremarkable  The visualized bowel gas pattern is grossly unremarkable in appearance. Clips are  seen overlying the left inguinal region.  IMPRESSION: No evidence of fracture or dislocation.   Electronically Signed   By: Roanna Raider M.D.   On: 03/01/2015 23:56   EKG: Independently reviewed. atrial fibrillation, rate controlled.  Assessment/Plan Principal Problem:   CAP (community acquired pneumonia) Active Problems:   Essential hypertension   FIBRILLATION, ATRIAL   Cerebral artery occlusion with cerebral infarction   H/O aortic valve replacement   Encephalopathy acute   Diabetes mellitus, type 2   1. CAP (community acquired pneumonia)  Possible aspiration Acute respiratory failure  The patient is presenting with numbness of cough with yellowish expectoration as well as right-sided chest pain. He also complains that he has been having choking episodes at home. Currently he is hemodynamically stable other than bradycardia. With this the patient will be admitted in the step down unit. He does not use any oxygen at home and currently requiring 4 L of oxygen to maintain adequate saturation. He will be treated with ceftriaxone and azithromycin. Follow cultures. He remains nothing by mouth until speech evaluation for possibility of aspiration pneumonia.  2. Atrial fibrillation, chronic anticoagulation.[place] VASC 8 Since the patient is therapeutic anticoagulation possibility of PE is less likely. Continuing home medication patient is becoming bradycardic occasionally. Monitor on stepdown unit.  3. Diabetes mellitus type 2. Holding oral hypoglycemic agents and placing the patient on sliding scale insulin.  4. Essential hypertension. Continue home medication.  5. Confusion.  most secondary to hypoxia. We will continue to closely monitor at the present evaluation the patient appears well oriented. Patient did have a fall earlier but a CT scan of the head is unremarkable for any active bleeding. Also he complained of some left hip pain although the x-rays also  unremarkable.  Advance goals of care discussion: Full code presumed.   DVT Prophylaxis: on therapeutic anticoagulation. Nutrition: Nothing by mouth  Family Communication: family was present at bedside, opportunity was given to ask question and all questions were answered satisfactorily at the time of interview. Attempted to reach patient's granddaughter as well as daughter on the phone were not successful and a voicemail was left. Disposition: Admitted as inpatient, step-down unit.  Author: Lynden Oxford, MD [facility] Pager: [phone] 03/01/2015  If 7PM-7AM, please contact night-coverage [email or web address] Password [email or web address]

## 2015-03-03 ENCOUNTER — Inpatient Hospital Stay (HOSPITAL_COMMUNITY): Payer: Medicare Other

## 2015-03-03 LAB — LEGIONELLA ANTIGEN, URINE

## 2015-03-03 LAB — GLUCOSE, CAPILLARY
GLUCOSE-CAPILLARY: 227 mg/dL — AB (ref 65–99)
Glucose-Capillary: 209 mg/dL — ABNORMAL HIGH (ref 65–99)
Glucose-Capillary: 224 mg/dL — ABNORMAL HIGH (ref 65–99)
Glucose-Capillary: 224 mg/dL — ABNORMAL HIGH (ref 65–99)
Glucose-Capillary: 290 mg/dL — ABNORMAL HIGH (ref 65–99)

## 2015-03-03 MED ORDER — PIPERACILLIN-TAZOBACTAM 3.375 G IVPB 30 MIN
3.3750 g | Freq: Once | INTRAVENOUS | Status: AC
  Administered 2015-03-03: 3.375 g via INTRAVENOUS
  Filled 2015-03-03: qty 50

## 2015-03-03 MED ORDER — VANCOMYCIN HCL IN DEXTROSE 750-5 MG/150ML-% IV SOLN
750.0000 mg | Freq: Once | INTRAVENOUS | Status: AC
  Administered 2015-03-03: 750 mg via INTRAVENOUS
  Filled 2015-03-03: qty 150

## 2015-03-03 MED ORDER — ENALAPRIL MALEATE 20 MG PO TABS
20.0000 mg | ORAL_TABLET | Freq: Every day | ORAL | Status: DC
  Administered 2015-03-03 – 2015-03-07 (×5): 20 mg via ORAL
  Filled 2015-03-03 (×7): qty 1

## 2015-03-03 MED ORDER — VANCOMYCIN HCL IN DEXTROSE 750-5 MG/150ML-% IV SOLN
750.0000 mg | Freq: Two times a day (BID) | INTRAVENOUS | Status: DC
  Administered 2015-03-03 – 2015-03-04 (×3): 750 mg via INTRAVENOUS
  Filled 2015-03-03 (×3): qty 150

## 2015-03-03 MED ORDER — PIPERACILLIN-TAZOBACTAM 3.375 G IVPB
3.3750 g | Freq: Three times a day (TID) | INTRAVENOUS | Status: DC
  Administered 2015-03-03 – 2015-03-06 (×10): 3.375 g via INTRAVENOUS
  Filled 2015-03-03 (×12): qty 50

## 2015-03-03 MED ORDER — RESOURCE THICKENUP CLEAR PO POWD
ORAL | Status: DC | PRN
  Filled 2015-03-03: qty 125

## 2015-03-03 NOTE — Progress Notes (Signed)
Report called to pt on 6E, made pts granddaughter aware of pts transfer though phone call. Pt is stable, will go up on telemetry.

## 2015-03-03 NOTE — Progress Notes (Signed)
Family translated for patient: explained medication that would be given this after, admission history, and clarified where he was and what we are trying to do.

## 2015-03-03 NOTE — Progress Notes (Signed)
Occupational Therapy Evaluation Patient Details Name: Harold Jones MRN: [id number] DOB: [birth year] Today's Date: 03/03/2015    History of Present Illness Harold Jones is a 81 y.o. male with Past meical history of hypertension, diabetes mellitus, atrial fibrillation, [place] VASC 4, coronary artery disease, CVA aortic replacement.Presented to ED with confusion.  Positive for CAP.     Clinical Impression   PTA, pt independent with ADL and mod I with mobility @ cane level. Pt very agreeable to participate with OT. Phone interpreter  [phone] used during session. Pt with decline in functional level of independence and will benefit from skilled OT services to facilitate safe D/C home with initial 24/7 S. Pt lives with wife, however, pt states his son- in law helps him as needed (unsure if son can be there 24/7). Recommend follow up with HHOT. Will follow acutely.     Follow Up Recommendations  Home health OT;Supervision/Assistance - 24 hour    Equipment Recommendations  3 in 1 bedside comode    Recommendations for Other Services       Precautions / Restrictions Precautions Precautions: Fall      Mobility Bed Mobility Overal bed mobility: Needs Assistance Bed Mobility: Supine to Sit;Sit to Supine     Supine to sit: Min assist Sit to supine: Min assist      Transfers Overall transfer level: Needs assistance Equipment used: 1 person hand held assist Transfers: Sit to/from Stand Sit to Stand: Min assist              Balance Overall balance assessment: Needs assistance   Sitting balance-Leahy Scale: Good       Standing balance-Leahy Scale: Fair Standing balance comment: able to stand with minimal support for peri care                            ADL Overall ADL's : Needs assistance/impaired     Grooming: Set up;Sitting   Upper Body Bathing: Set up;Supervision/ safety;Sitting   Lower Body Bathing: Minimal assistance;Sit to/from stand   Upper  Body Dressing : Set up;Sitting   Lower Body Dressing: Minimal assistance;Sit to/from stand   Toilet Transfer: Minimal assistance;RW;Grab bars;Comfort height toilet;Ambulation Toilet Transfer Details (indicate cue type and reason): Assist to get up from toilet Toileting- Clothing Manipulation and Hygiene: Supervision/safety;Sit to/from stand       Functional mobility during ADLs: Minimal assistance;Cueing for sequencing;Rolling walker;Cueing for safety General ADL Comments: Pt fatigues with functional tasks. Decreased safety at times. Pt would state "this is how old people do it"  Asked about available help at discharge and if someone could be with him 24/7 other than his wife. Pt continued to state to the interpreter that he was not returning to [place].                      Pertinent Vitals/Pain Pain Assessment: No/denies pain; vitals stable.     Hand Dominance     Extremity/Trunk Assessment Upper Extremity Assessment Upper Extremity Assessment: Generalized weakness   Lower Extremity Assessment Lower Extremity Assessment: Generalized weakness   Cervical / Trunk Assessment Cervical / Trunk Assessment: Kyphotic   Communication Communication Communication: Interpreter utilized;Prefers language other than English (Bosnian)   Cognition Arousal/Alertness: Awake/alert Behavior During Therapy: WFL for tasks assessed/performed Overall Cognitive Status: No family/caregiver present to determine baseline cognitive functioning         Following Commands: Follows one step commands consistently Safety/Judgement: Decreased awareness of safety  General Comments: Difficult to assess cognition. Using interpreter however pt HOH and difficult to assess how much pt could hear. Interpreter repeated herself several times and continued to get inaccurate information at times.   General Comments       Exercises       Shoulder Instructions      Home Living Family/patient expects  to be discharged to:: Private residence Living Arrangements: Spouse/significant other;Children Available Help at Discharge: Available 24 hours/day Type of Home: Apartment Home Access: Level entry     Home Layout: One level     Bathroom Shower/Tub: Tub/shower unit Shower/tub characteristics: Engineer, building services: Handicapped height Bathroom Accessibility: Yes How Accessible: Accessible via walker Home Equipment: Shower seat;Cane - single point;Grab bars - tub/shower   Additional Comments: unsure if pt has 3 in 1      Prior Functioning/Environment Level of Independence: Independent;Independent with assistive device(s)        Comments: used cane at times    OT Diagnosis: Generalized weakness   OT Problem List: Decreased strength;Decreased range of motion;Decreased activity tolerance;Impaired balance (sitting and/or standing);Decreased safety awareness;Decreased knowledge of use of DME or AE;Cardiopulmonary status limiting activity   OT Treatment/Interventions: Therapeutic exercise;Self-care/ADL training;Energy conservation;DME and/or AE instruction;Therapeutic activities;Patient/family education;Balance training    OT Goals(Current goals can be found in the care plan section) Acute Rehab OT Goals Patient Stated Goal: to be able to take care of myself again OT Goal Formulation: With patient Time For Goal Achievement: 03/17/15 Potential to Achieve Goals: Good  OT Frequency: Min 2X/week   Barriers to D/C:            Co-evaluation              End of Session Equipment Utilized During Treatment: Gait belt;Rolling walker Nurse Communication: Mobility status  Activity Tolerance: Patient tolerated treatment well Patient left: in bed;with call bell/phone within reach;with bed alarm set   Time: 1610-9604 OT Time Calculation (min): 39 min Charges:  OT General Charges $OT Visit: 1 Procedure OT Evaluation $Initial OT Evaluation Tier I: 1 Procedure OT  Treatments $Self Care/Home Management : 23-37 mins G-Codes:    Layn Kye,HILLARY 2015-03-18, 5:32 PM   [facility], OTR/L  [phone] 18-Mar-2015

## 2015-03-03 NOTE — Progress Notes (Signed)
ANTIBIOTIC CONSULT NOTE - INITIAL  Pharmacy Consult for Vancocin and Zosyn Indication: bacteremia  No Known Allergies  Patient Measurements: Height: 6' (182.9 cm) Weight: 199 lb 1.2 oz (90.3 kg) IBW/kg (Calculated) : 77.6  Vital Signs: Temp: 98.8 F (37.1 C) (08/31 2050) Temp Source: Oral (08/31 2050) BP: 186/92 mmHg (09/01 0000) Pulse Rate: 92 (09/01 0000) Intake/Output from previous day: 08/31 0701 - 09/01 0700 In: 100 [I.V.:50; IV Piggyback:50] Out: 100 [Urine:100] Intake/Output from this shift: Total I/O In: 90 [I.V.:40; IV Piggyback:50] Out: 100 [Urine:100]  Labs:  Recent Labs  03/01/15 1937 03/02/15 0730  WBC 7.7 8.1  HGB 12.3* 14.1  PLT 121* 108*  CREATININE 1.36* 1.26*   Estimated Creatinine Clearance: 47 mL/min (by C-G formula based on Cr of 1.26).   Microbiology: Recent Results (from the past 720 hour(s))  Blood culture (routine x 2)     Status: None (Preliminary result)   Collection Time: 03/01/15  7:37 PM  Result Value Ref Range Status   Specimen Description BLOOD RIGHT ANTECUBITAL  Final   Special Requests BOTTLES DRAWN AEROBIC AND ANAEROBIC 10CC  Final   Culture NO GROWTH < 12 HOURS  Final   Report Status PENDING  Incomplete  Urine culture     Status: None   Collection Time: 03/01/15  7:39 PM  Result Value Ref Range Status   Specimen Description URINE, CATHETERIZED  Final   Special Requests NONE  Final   Culture NO GROWTH 1 DAY  Final   Report Status 03/02/2015 FINAL  Final  Blood culture (routine x 2)     Status: None (Preliminary result)   Collection Time: 03/01/15  7:43 PM  Result Value Ref Range Status   Specimen Description BLOOD RIGHT HAND  Final   Special Requests BOTTLES DRAWN AEROBIC AND ANAEROBIC 10CC  Final   Culture  Setup Time   Final    GRAM POSITIVE COCCI IN CLUSTERS AEROBIC BOTTLE ONLY CRITICAL RESULT CALLED TO, READ BACK BY AND VERIFIED WITH: Bridget Hartshorn RN 2026 03/02/15 A BROWNING    Culture NO GROWTH < 12 HOURS  Final   Report Status PENDING  Incomplete  MRSA PCR Screening     Status: None   Collection Time: 03/02/15  1:53 AM  Result Value Ref Range Status   MRSA by PCR NEGATIVE NEGATIVE Final    Comment:        The GeneXpert MRSA Assay (FDA approved for NASAL specimens only), is one component of a comprehensive MRSA colonization surveillance program. It is not intended to diagnose MRSA infection nor to guide or monitor treatment for MRSA infections.   Culture, sputum-assessment     Status: None   Collection Time: 03/02/15  7:14 AM  Result Value Ref Range Status   Specimen Description EXPECTORATED SPUTUM  Final   Special Requests NONE  Final   Sputum evaluation   Final    THIS SPECIMEN IS ACCEPTABLE. RESPIRATORY CULTURE REPORT TO FOLLOW.   Report Status 03/02/2015 FINAL  Final  Gram stain     Status: None   Collection Time: 03/02/15  7:14 AM  Result Value Ref Range Status   Specimen Description SPUTUM  Final   Special Requests NONE  Final   Gram Stain   Final    ABUNDANT WBC PRESENT, PREDOMINANTLY PMN FEW GRAM POSITIVE COCCI IN PAIRS RARE GRAM POSITIVE RODS RARE GRAM NEGATIVE RODS    Report Status 03/02/2015 FINAL  Final    Medical History: Past Medical History  Diagnosis Date  .  Hypertension   . Diabetes mellitus   . FIBRILLATION, ATRIAL 08/31/2002    Qualifier: Diagnosis of  By: Barbaraann Barthel MD, [place]    . CVA 02/28/2004    Annotation: embolic Qualifier: Diagnosis of  By: Barbaraann Barthel MD, [place]    . COGNITIVE DEFICITS DUE CEREBROVASCULAR DISEASE 09/09/2009    Qualifier: Diagnosis of  By: Delrae Alfred MD, Lanora Manis    . CORONARY ARTERY DISEASE 02/27/2007    Qualifier: Diagnosis of  By: Barbaraann Barthel MD, [place]    . DYSLIPIDEMIA 02/27/2007    Qualifier: Diagnosis of  By: Barbaraann Barthel MD, [place]    . Bradycardia   . Aortic valve disorder     Medications:  Prescriptions prior to admission  Medication Sig Dispense Refill Last Dose  . enalapril (VASOTEC) 20 MG tablet Take 20 mg by mouth at  bedtime.    02/28/2015 at Unknown time  . furosemide (LASIX) 20 MG tablet Take 20 mg by mouth daily.   03/01/2015 at Unknown time  . gabapentin (NEURONTIN) 300 MG capsule Take 300 mg by mouth 2 (two) times daily.   03/01/2015 at Unknown time  . glipiZIDE (GLUCOTROL) 5 MG tablet Take 2.5 mg by mouth daily before breakfast.   03/01/2015 at Unknown time  . pravastatin (PRAVACHOL) 40 MG tablet Take 40 mg by mouth daily.   03/01/2015 at Unknown time  . Rivaroxaban (XARELTO) 15 MG TABS tablet Take 15 mg by mouth daily.   03/01/2015 at Unknown time  . tamsulosin (FLOMAX) 0.4 MG CAPS capsule Take 0.4 mg by mouth daily after supper. 30 minutes after the same meal every day.  3 02/28/2015 at Unknown time   Scheduled:  . antiseptic oral rinse  7 mL Mouth Rinse BID  . gabapentin  300 mg Oral BID  . insulin aspart  0-9 Units Subcutaneous Q6H  . pravastatin  40 mg Oral q1800  . rivaroxaban  15 mg Oral Q supper  . tamsulosin  0.4 mg Oral QPC supper    Assessment: 81yo male admitted 8/30 w/ confusion and CAP, now w/ blood cx showing GPC, GPR, and GNR, to broaden IV ABX.  Goal of Therapy:  Vancomycin trough level 15-20 mcg/ml  Plan:  Will begin vancomycin 750mg  IV Q12H and Zosyn 3.375g IV Q8H and monitor CBC, Cx, levels prn.  Vernard Gambles, PharmD, BCPS  03/03/2015,12:30 AM

## 2015-03-03 NOTE — Progress Notes (Signed)
Pt admitted to the unit. Pt is stable, alert and oriented per baseline. Oriented to room, staff, and call bell to best of knowledge. Educated to call for any assistance. Bed in lowest position, call bell within reach- will continue to monitor.

## 2015-03-03 NOTE — Progress Notes (Addendum)
Physical Therapy Treatment Patient Details Name: Harold Jones MRN: [id number] DOB: 1928-01-20 Today's Date: 03/03/2015    History of Present Illness Harold Jones is a 81 y.o. male with Past meical history of hypertension, diabetes mellitus, atrial fibrillation, [place] VASC 4, coronary artery disease, CVA aortic replacement.Presented to ED with confusion.  Positive for CAP.      PT Comments    Pt admitted with above diagnosis. Pt currently with functional limitations due to balance and endurance deficits. Pt able to ambulate with 2 person assist.  Pt seemed more oriented but still needs cues for stability.  No family present.  If the family cannot be with pt at home, he will need higher level of care such as NH.   Pt will benefit from skilled PT to increase their independence and safety with mobility to allow discharge to the venue listed below.    Follow Up Recommendations  Home health PT;Supervision/Assistance - 24 hour     Equipment Recommendations  Rolling walker with 5" wheels    Recommendations for Other Services       Precautions / Restrictions Precautions Precautions: Fall Restrictions Weight Bearing Restrictions: No    Mobility  Bed Mobility Overal bed mobility: Needs Assistance;+2 for physical assistance Bed Mobility: Supine to Sit     Supine to sit: Min guard     General bed mobility comments: On arrival, nursing had just helped pt up off bed as he had climbed to EOB on his own and was getting up on their arrival.  Lines were tangled and NT was trying to get lines untangled as it appeared pt rushing toward  bathroom.  Pt only sat there 1 minute and was getting up off toilet therefore PT decided to go ahead and ambulate with pt.  Attempted to call interpreter line as pt began walk to find out why pt was getting up.    Transfers Overall transfer level: Needs assistance Equipment used: 2 person hand held assist Transfers: Sit to/from Stand Sit to Stand: Min  assist         General transfer comment: Needs steadying assist for stability initially.   Ambulation/Gait Ambulation/Gait assistance: Min assist;+2 physical assistance Ambulation Distance (Feet): 125 Feet Assistive device: 2 person hand held assist Gait Pattern/deviations: Step-through pattern;Decreased stride length;Drifts right/left;Trunk flexed   Gait velocity interpretation: Below normal speed for age/gender General Gait Details: Pt required 2 person assist most of time.  Pt kept bending over and needed cues to stand tall.  Interpreter line with a long wait and then disconnected while in hallway.  Pt kept walking as it appeared he wanted to keep walking.  Once back in room and seated, called the interpreter back and interpreter stated that pt stated "I got up because my knees were hurting."  Pt instructed to use call button to call nurse when he wanted to go back to bed.  Pt appeared to understand and said he will call prior to getting up.  Pt stated," I am an old man.  I can't do what I used to.  You all are so nice here helping me."   Stairs            Wheelchair Mobility    Modified Rankin (Stroke Patients Only)       Balance Overall balance assessment: Needs assistance Sitting-balance support: No upper extremity supported;Feet supported Sitting balance-Leahy Scale: Good Sitting balance - Comments: Pt can withstand challenges to balance in sitting.     Standing balance support: Bilateral  upper extremity supported;During functional activity Standing balance-Leahy Scale: Poor Standing balance comment: Requiring UE support for balance.  Needed guidance for standing.                      Cognition Arousal/Alertness: Awake/alert Behavior During Therapy: WFL for tasks assessed/performed Overall Cognitive Status: Impaired/Different from baseline Area of Impairment: Following commands;Safety/judgement;Orientation Orientation Level: Disoriented to;Time;Situation      Following Commands: Follows one step commands inconsistently Safety/Judgement: Decreased awareness of safety     General Comments: Pt appears slightly more oriented today. Knew he was in hospital but did not appear to know city and state.    Exercises      General Comments General comments (skin integrity, edema, etc.): ST came in at end of session and was wanting to talk with pt and interpreter therefore passed the phone to ST.  Used interpreter [place] with [phone].      Pertinent Vitals/Pain Pain Assessment: No/denies pain  VSS    Home Living                      Prior Function            PT Goals (current goals can now be found in the care plan section) Acute Rehab PT Goals Patient Stated Goal: to get better Progress towards PT goals: Progressing toward goals    Frequency  Min 3X/week    PT Plan Current plan remains appropriate    Co-evaluation             End of Session Equipment Utilized During Treatment: Gait belt Activity Tolerance: Patient tolerated treatment well Patient left: in chair;with call bell/phone within reach;with chair alarm set (with ST present)     Time: 0454-0981 PT Time Calculation (min) (ACUTE ONLY): 27 min  Charges:  $Gait Training: 23-37 mins                    G CodesBerline Lopes 03/28/15, 10:40 AM [employer] Acute Rehabilitation [phone] (pager)

## 2015-03-03 NOTE — Progress Notes (Signed)
Speech Pathology Note MBSS complete. Full report located under chart review in imaging section. Kewan Mcnease, MA CCC-SLP [phone]  

## 2015-03-03 NOTE — Progress Notes (Signed)
[facility] PROGRESS NOTE   Harold Jones ZOX:[id number] DOB: [birth year] DOA: 03/01/2015 PCP: Harold Manson, MD  HPI/Subjective: Discussed with the granddaughter Harold Jones. Patient feels better today, still has some cough and shortness of breath.  Assessment/Plan: Principal Problem:   CAP (community acquired pneumonia) Active Problems:   Essential hypertension   FIBRILLATION, ATRIAL   Cerebral artery occlusion with cerebral infarction   H/O aortic valve replacement   Encephalopathy acute   Diabetes mellitus, type 2   Acute respiratory failure Presented with shortness of breath and hypoxia. Patient required 4 L in the emergency department to keep his saturation above 90%. This is likely secondary to the community-acquired pneumonia. Improving, wean off of oxygen as tolerated.  Community acquired versus aspiration pneumonia Presented with right-sided chest pain, SOB and productive cough of yellow sputum Questionable aspiration, patient reported choking episodes at home. Started on Zosyn and vancomycin.  Dysphagia SLP saw the patient and recommended nothing by mouth for now as he was not cooperating. Per SOB this is partly because of language barrier and confusion. Hopefully he will do better today.  Sepsis Patient presented to the hospital with temperature 102.5, RR of 22 and presence of pneumonia. Patient treated with broad-spectrum antibiotics Zosyn and vancomycin. Treated with hydration with IV fluids.  Atrial fibrillation, chronic anticoagulation. Since the patient is therapeutic anticoagulation possibility of PE is less likely. Continuing home medication patient is becoming bradycardic occasionally. This patients CHA2DS2-VASc Score and unadjusted Ischemic Stroke Rate (% per year) is equal to 7.2 % stroke rate/year from a score of 5 for HTN, DM, CAD and 2 points for previous CVA. Above score calculated as 1 point each if present [CHF, HTN, DM,  Vascular=MI/PAD/Aortic Plaque, Age if 65-74, or Male] Above score calculated as 2 points each if present [Age > 75, or Stroke/TIA/TE]  Diabetes mellitus type 2. Holding oral hypoglycemic agents and placing the patient on sliding scale insulin.  Essential hypertension. Continue home medication.  Confusion Likely secondary to the sepsis, hypoxia and acute illness. at the present evaluation the patient appears well oriented. Patient did have a fall earlier but a CT scan of the head is unremarkable for any active bleeding. Appears to be improving, he he is not lethargic today.  Positive blood cultures One out of 2 blood cultures showed gram-positive cocci in clusters. Patient is already on vancomycin, will follow culture results.  Code Status: Full Code Family Communication: Discussed with granddaughter Jones over the phone Disposition Plan: Remains inpatient Diet: Diet NPO time specified  Consultants:  None  Procedures:  None  Antibiotics:  Zosyn and vancomycin.   Objective: Filed Vitals:   03/03/15 0800  BP: 160/73  Pulse: 86  Temp: 97.6 F (36.4 C)  Resp: 17    Intake/Output Summary (Last 24 hours) at 03/03/15 1112 Last data filed at 03/03/15 0622  Gross per 24 hour  Intake    500 ml  Output    400 ml  Net    100 ml   Filed Weights   03/02/15 0106 03/03/15 0500  Weight: 90.3 kg (199 lb 1.2 oz) 88.4 kg (194 lb 14.2 oz)    Exam: General: Alert and awake, oriented x3, not in any acute distress. HEENT: anicteric sclera, pupils reactive to light and accommodation, EOMI CVS: S1-S2 clear, no murmur rubs or gallops Chest: clear to auscultation bilaterally, no wheezing, rales or rhonchi Abdomen: soft nontender, nondistended, normal bowel sounds, no organomegaly Extremities: no cyanosis, clubbing or edema noted bilaterally Neuro: Cranial nerves II-XII intact,  no focal neurological deficits  Data Reviewed: Basic Metabolic Panel:  Recent Labs Lab  03/01/15 1937 03/02/15 0730  NA 134* 136  K 3.8 4.1  CL 104 101  CO2 23 22  GLUCOSE 164* 162*  BUN 17 18  CREATININE 1.36* 1.26*  CALCIUM 8.3* 8.4*  MG  --  1.4*   Liver Function Tests:  Recent Labs Lab 03/01/15 1937 03/02/15 0730  AST 15 22  ALT 9* 12*  ALKPHOS 25* 29*  BILITOT 1.5* 1.6*  PROT 6.2* 7.1  ALBUMIN 3.2* 3.5   No results for input(s): LIPASE, AMYLASE in the last 168 hours. No results for input(s): AMMONIA in the last 168 hours. CBC:  Recent Labs Lab 03/01/15 1937 03/02/15 0730  WBC 7.7 8.1  NEUTROABS 6.0 6.3  HGB 12.3* 14.1  HCT 35.1* 40.6  MCV 88.9 88.8  PLT 121* 108*   Cardiac Enzymes: No results for input(s): CKTOTAL, CKMB, CKMBINDEX, TROPONINI in the last 168 hours. BNP (last 3 results) No results for input(s): BNP in the last 8760 hours.  ProBNP (last 3 results) No results for input(s): PROBNP in the last 8760 hours.  CBG:  Recent Labs Lab 03/01/15 2358 03/02/15 0611 03/03/15 0043 03/03/15 0614 03/03/15 0822  GLUCAP 168* 136* 224* 224* 209*    Micro Recent Results (from the past 240 hour(s))  Blood culture (routine x 2)     Status: None (Preliminary result)   Collection Time: 03/01/15  7:37 PM  Result Value Ref Range Status   Specimen Description BLOOD RIGHT ANTECUBITAL  Final   Special Requests BOTTLES DRAWN AEROBIC AND ANAEROBIC 10CC  Final   Culture NO GROWTH < 12 HOURS  Final   Report Status PENDING  Incomplete  Urine culture     Status: None   Collection Time: 03/01/15  7:39 PM  Result Value Ref Range Status   Specimen Description URINE, CATHETERIZED  Final   Special Requests NONE  Final   Culture NO GROWTH 1 DAY  Final   Report Status 03/02/2015 FINAL  Final  Blood culture (routine x 2)     Status: None (Preliminary result)   Collection Time: 03/01/15  7:43 PM  Result Value Ref Range Status   Specimen Description BLOOD RIGHT HAND  Final   Special Requests BOTTLES DRAWN AEROBIC AND ANAEROBIC 10CC  Final   Culture   Setup Time   Final    GRAM POSITIVE COCCI IN CLUSTERS AEROBIC BOTTLE ONLY CRITICAL RESULT CALLED TO, READ BACK BY AND VERIFIED WITH: Harold Hartshorn RN 2026 03/02/15 A BROWNING    Culture NO GROWTH < 12 HOURS  Final   Report Status PENDING  Incomplete  MRSA PCR Screening     Status: None   Collection Time: 03/02/15  1:53 AM  Result Value Ref Range Status   MRSA by PCR NEGATIVE NEGATIVE Final    Comment:        The GeneXpert MRSA Assay (FDA approved for NASAL specimens only), is one component of a comprehensive MRSA colonization surveillance program. It is not intended to diagnose MRSA infection nor to guide or monitor treatment for MRSA infections.   Culture, sputum-assessment     Status: None   Collection Time: 03/02/15  7:14 AM  Result Value Ref Range Status   Specimen Description EXPECTORATED SPUTUM  Final   Special Requests NONE  Final   Sputum evaluation   Final    THIS SPECIMEN IS ACCEPTABLE. RESPIRATORY CULTURE REPORT TO FOLLOW.   Report Status 03/02/2015 FINAL  Final  Gram stain     Status: None   Collection Time: 03/02/15  7:14 AM  Result Value Ref Range Status   Specimen Description SPUTUM  Final   Special Requests NONE  Final   Gram Stain   Final    ABUNDANT WBC PRESENT, PREDOMINANTLY PMN FEW GRAM POSITIVE COCCI IN PAIRS RARE GRAM POSITIVE RODS RARE GRAM NEGATIVE RODS    Report Status 03/02/2015 FINAL  Final     Studies: Dg Chest 2 View  03/01/2015   CLINICAL DATA:  Patient with chest pain for multiple days.  EXAM: CHEST  2 VIEW  COMPARISON:  Chest radiograph 11/12/2013  FINDINGS: Monitoring leads overlie the patient. Stable enlarged cardiac and mediastinal contours status post median sternotomy. Pulmonary vascular redistribution. Interval development of focal consolidation within the right mid lung. Bibasilar atelectasis and or scarring. Regional skeleton is unremarkable.  IMPRESSION: New consolidative opacity within the right mid lung concerning for pneumonia in the  appropriate clinical setting. Followup PA and lateral chest X-ray is recommended in 3-4 weeks following trial of antibiotic therapy to ensure resolution and exclude underlying malignancy.   Electronically Signed   By: Annia Belt M.D.   On: 03/01/2015 19:14   Ct Head Wo Contrast  03/01/2015   CLINICAL DATA:  Altered mental status for 2 days  EXAM: CT HEAD WITHOUT CONTRAST  TECHNIQUE: Contiguous axial images were obtained from the base of the skull through the vertex without intravenous contrast.  COMPARISON:  Brain MRI 11/13/2013, head CT 11/12/2013  FINDINGS: Left cerebellar encephalomalacia reidentified. Mild cortical volume loss noted with proportional ventricular prominence. Areas of periventricular white matter hypodensity are most compatible with small vessel ischemic change. No acute hemorrhage, infarct, or mass lesion is identified. The patient is angled on the gantry. No midline shift. Stable bone deformity is noted, with interval healing since seen on prior exam 11/12/2013. No skull fracture.  IMPRESSION: No acute intracranial finding.   Electronically Signed   By: Christiana Pellant M.D.   On: 03/01/2015 19:16   Dg Hips Bilat With Pelvis 3-4 Views  03/01/2015   CLINICAL DATA:  Question of fall. Bilateral leg pain. Confusion. Initial encounter.  EXAM: DG HIP (WITH OR WITHOUT PELVIS) 3-4V BILAT  COMPARISON:  None.  FINDINGS: There is no evidence of fracture or dislocation. Both femoral heads are seated normally within their respective acetabula. The proximal femurs appear intact. Mild degenerative change is noted at the lower lumbar spine. The sacroiliac joints are grossly unremarkable  The visualized bowel gas pattern is grossly unremarkable in appearance. Clips are seen overlying the left inguinal region.  IMPRESSION: No evidence of fracture or dislocation.   Electronically Signed   By: Roanna Raider M.D.   On: 03/01/2015 23:56    Scheduled Meds: . antiseptic oral rinse  7 mL Mouth Rinse BID  .  gabapentin  300 mg Oral BID  . insulin aspart  0-9 Units Subcutaneous Q6H  . piperacillin-tazobactam (ZOSYN)  IV  3.375 g Intravenous Q8H  . pravastatin  40 mg Oral q1800  . rivaroxaban  15 mg Oral Q supper  . tamsulosin  0.4 mg Oral QPC supper  . vancomycin  750 mg Intravenous Q12H   Continuous Infusions:      Time spent: 35 minutes    [facility] A  [facility] Pager [phone] If 7PM-7AM, please contact night-coverage at [email or web address], password [facility] 03/03/2015, 11:12 AM  LOS: 2 days

## 2015-03-04 LAB — GLUCOSE, CAPILLARY
GLUCOSE-CAPILLARY: 268 mg/dL — AB (ref 65–99)
GLUCOSE-CAPILLARY: 203 mg/dL — AB (ref 65–99)
Glucose-Capillary: 173 mg/dL — ABNORMAL HIGH (ref 65–99)
Glucose-Capillary: 215 mg/dL — ABNORMAL HIGH (ref 65–99)
Glucose-Capillary: 219 mg/dL — ABNORMAL HIGH (ref 65–99)
Glucose-Capillary: 306 mg/dL — ABNORMAL HIGH (ref 65–99)

## 2015-03-04 LAB — CULTURE, BLOOD (ROUTINE X 2)

## 2015-03-04 LAB — CBC
HEMATOCRIT: 38.1 % — AB (ref 39.0–52.0)
HEMOGLOBIN: 13.3 g/dL (ref 13.0–17.0)
MCH: 30.7 pg (ref 26.0–34.0)
MCHC: 34.9 g/dL (ref 30.0–36.0)
MCV: 88 fL (ref 78.0–100.0)
Platelets: 138 10*3/uL — ABNORMAL LOW (ref 150–400)
RBC: 4.33 MIL/uL (ref 4.22–5.81)
RDW: 13 % (ref 11.5–15.5)
WBC: 6.2 10*3/uL (ref 4.0–10.5)

## 2015-03-04 LAB — BASIC METABOLIC PANEL
ANION GAP: 8 (ref 5–15)
BUN: 25 mg/dL — ABNORMAL HIGH (ref 6–20)
CALCIUM: 8.6 mg/dL — AB (ref 8.9–10.3)
CO2: 22 mmol/L (ref 22–32)
Chloride: 108 mmol/L (ref 101–111)
Creatinine, Ser: 1.28 mg/dL — ABNORMAL HIGH (ref 0.61–1.24)
GFR, EST AFRICAN AMERICAN: 57 mL/min — AB (ref >60)
GFR, EST NON AFRICAN AMERICAN: 49 mL/min — AB (ref >60)
GLUCOSE: 256 mg/dL — AB (ref 65–99)
POTASSIUM: 3.6 mmol/L (ref 3.5–5.1)
SODIUM: 138 mmol/L (ref 135–145)

## 2015-03-04 NOTE — Progress Notes (Signed)
Occupational Therapy Treatment Patient Details Name: Harold Jones MRN: [id number] DOB: 08/05/1927 Today's Date: 03/04/2015    History of present illness Harold Jones is a 81 y.o. male with Past meical history of hypertension, diabetes mellitus, atrial fibrillation, [place] VASC 4, coronary artery disease, CVA aortic replacement.Presented to ED with confusion.  Positive for CAP.     OT comments  Pt's grandson in law present for visit today and interpreting.  Pt at times tearful, does not think he will get back to normal after this illness. Observed pt's level of mobility and stated that pt's wife is not able to physically assist pt, only supervise him.  Stated pt has been having increasing difficulty with his short term memory for several months with gradual decline in functioning.  Pt currently requiring min guard to min assist. Grandson in law to talk with family about discharge disposition. Will continue to follow.   Follow Up Recommendations  SNF;Supervision/Assistance - 24 hour (depending on level of assist family can provide)    Equipment Recommendations  3 in 1 bedside comode    Recommendations for Other Services      Precautions / Restrictions Precautions Precautions: Fall       Mobility Bed Mobility Overal bed mobility: Needs Assistance Bed Mobility: Supine to Sit     Supine to sit: Supervision     General bed mobility comments: Required extra time, no physical assist.  Transfers Overall transfer level: Needs assistance Equipment used: Rolling walker (2 wheeled) Transfers: Sit to/from Stand Sit to Stand: Min guard         General transfer comment: Needs steadying assist for stability initially, verbal and physical cues for hand placement    Balance Overall balance assessment: Needs assistance   Sitting balance-Leahy Scale: Fair       Standing balance-Leahy Scale: Fair                     ADL Overall ADL's : Needs  assistance/impaired Eating/Feeding: Supervision/ safety;Sitting;Set up Eating/Feeding Details (indicate cue type and reason): pt attempting to use knife to spear carrots Grooming: Wash/dry face;Wash/dry hands;Set up;Sitting           Upper Body Dressing : Set up;Sitting Upper Body Dressing Details (indicate cue type and reason): front opening gown   Lower Body Dressing Details (indicate cue type and reason): reached down while seated on 3 in 1 to pull up socks with supervision Toilet Transfer: Minimal assistance;RW;BSC;Ambulation   Toileting- Clothing Manipulation and Hygiene: Minimal assistance;Sit to/from stand Toileting - Clothing Manipulation Details (indicate cue type and reason): assist for gown     Functional mobility during ADLs: Minimal assistance;Rolling walker (attempting to release walker/pick it up)        Vision                     Perception     Praxis      Cognition   Behavior During Therapy: [name] for tasks assessed/performed Overall Cognitive Status: History of cognitive impairments - at baseline (per granddaughter's husband) Area of Impairment: Orientation;Memory;Following commands;Safety/judgement Orientation Level: Disoriented to;Place;Time;Situation   Memory: Decreased short-term memory (per family member)  Following Commands: Follows one step commands consistently (with multimodal cues) Safety/Judgement: Decreased awareness of safety     General Comments: Pt tearful at time with family member, stating he feels worse than yesterday.    Extremity/Trunk Assessment               Exercises  Shoulder Instructions       General Comments      Pertinent Vitals/ Pain       Pain Assessment: Faces Faces Pain Scale: No hurt  Home Living                                          Prior Functioning/Environment              Frequency Min 2X/week     Progress Toward Goals  OT Goals(current goals can now be  found in the care plan section)  Progress towards OT goals: Progressing toward goals  Acute Rehab OT Goals Time For Goal Achievement: 03/17/15 Potential to Achieve Goals: Good  Plan Discharge plan needs to be updated    Co-evaluation                 End of Session Equipment Utilized During Treatment: Gait belt;Rolling walker   Activity Tolerance Patient limited by fatigue   Patient Left in chair;with call bell/phone within reach;with chair alarm set   Nurse Communication  (NT made aware pt is in chair with 02 and alarm on)        Time: 1144-1235 OT Time Calculation (min): 51 min  Charges: OT General Charges $OT Visit: 1 Procedure OT Treatments $Self Care/Home Management : 38-52 mins  Evern Bio 03/04/2015, 1:49 PM  [phone]

## 2015-03-04 NOTE — Progress Notes (Signed)
Speech Language Pathology Treatment: Dysphagia  Patient Details Name: Harold Jones MRN: [id number] DOB: July 09, 1927 Today's Date: 03/04/2015 Time: 4098-1191 SLP Time Calculation (min) (ACUTE ONLY): 10 min  Assessment / Plan / Recommendation Clinical Impression  Skilled treatment session focused on addressing dysphagia goals.  SLP facilitated session with set-up of nectar-thick liquids which patient was agreeable to sit up and drink with Mod multimodal cues for portion control of sip size.  Attempts to fade cues resulted in large sips and multiple swallows.  Continue with current plan of care.   Objective assessment completed yesterday with recommendations to complete family education today; however, family is not present.  SLP called granddaughter to share results of MBS and explained need for nectar-thick liquids.  SLP informed of staff's ability to teach thickening liquids upon next visit.     HPI Other Pertinent Information: Styles Vanness is a 81 y.o. male with Past medical history of hypertension, diabetes mellitus, atrial fibrillation, [place] VASC 8, coronary artery disease, CVA aortic replacement. Admitted with confusion, SOB, cough for two weeks with sputum. CXR shows New consolidative opacity within the right mid lung concerning for   Pertinent Vitals Pain Assessment: No/denies pain Faces Pain Scale: No hurt  SLP Plan  Continue with current plan of care    Recommendations Diet recommendations: Dysphagia 3 (mechanical soft);Nectar-thick liquid Liquids provided via: Cup;No straw Medication Administration: Whole meds with puree Supervision: Patient able to self feed;Full supervision/cueing for compensatory strategies Compensations: Slow rate;Small sips/bites Postural Changes and/or Swallow Maneuvers: Seated upright 90 degrees              Oral Care Recommendations: Oral care BID Follow up Recommendations: 24 hour supervision/assistance;Skilled Nursing facility Plan: Continue  with current plan of care    GO    Charlane Ferretti., CCC-SLP [phone]  Karlee Staff 03/04/2015, 4:34 PM

## 2015-03-04 NOTE — Care Management Important Message (Signed)
Important Message  Patient Details  Name: Harold Jones MRN: [id number] Date of Birth: Oct 17, 1927   Medicare Important Message Given:  Yes-second notification given    Orson Aloe 03/04/2015, 10:22 AM

## 2015-03-04 NOTE — Progress Notes (Signed)
Inpatient Diabetes Program Recommendations  AACE/ADA: New Consensus Statement on Inpatient Glycemic Control (2013)  Target Ranges:  Prepandial:   less than 140 mg/dL      Peak postprandial:   less than 180 mg/dL (1-2 hours)      Critically ill patients:  140 - 180 mg/dL   Reason for evaluation: Hyperglycemia  Results for GABRIELL, CASIMIR (MRN [id number]) as of 03/04/2015 10:59  Ref. Range 03/03/2015 08:22 03/03/2015 12:46 03/03/2015 17:51 03/04/2015 00:06 03/04/2015 05:09  Glucose-Capillary Latest Ref Range: 65-99 mg/dL 409 (H) 811 (H) 914 (H) 219 (H) 268 (H)   Inpatient Diabetes Program Recommendations Insulin - Basal: add Lantus or Levemir 10 units  HgbA1C: order to assess prehospital glucose control  Thank you  Piedad Climes BSN, RN,CDE Inpatient Diabetes Coordinator [phone] (team pager)

## 2015-03-04 NOTE — Progress Notes (Signed)
[facility] PROGRESS NOTE   Harold Jones ZOX:[id number] DOB: Mar 07, 1928 DOA: 03/01/2015 PCP: Julieanne Manson, MD  HPI/Subjective: Seen with son-in-law at bedside. Patient defervesces, I will discontinue vancomycin and blood cultures showed coagulase-negative staph. Feels much better, improving, continue to monitor, home versus SNF.  Assessment/Plan: Principal Problem:   CAP (community acquired pneumonia) Active Problems:   Essential hypertension   FIBRILLATION, ATRIAL   Cerebral artery occlusion with cerebral infarction   H/O aortic valve replacement   Encephalopathy acute   Diabetes mellitus, type 2   Acute respiratory failure Presented with shortness of breath and hypoxia. Patient required 4 L in the emergency department to keep his saturation above 90%. This is likely secondary to the community-acquired pneumonia. Improving, wean off of oxygen as tolerated.  Community acquired versus aspiration pneumonia Presented with right-sided chest pain, SOB and productive cough of yellow sputum Questionable aspiration, patient reported choking episodes at home. Started on Zosyn and vancomycin, discontinue vancomycin.  Dysphagia SLP saw the patient and recommended nothing by mouth for now as he was not cooperating. Per SOB this is partly because of language barrier and confusion. Hopefully he will do better today.  Sepsis Patient presented to the hospital with temperature 102.5, RR of 22 and presence of pneumonia. Patient treated with broad-spectrum antibiotics Zosyn and vancomycin. Treated with hydration with IV fluids.  Atrial fibrillation, chronic anticoagulation. Since the patient is therapeutic anticoagulation possibility of PE is less likely. Continuing home medication patient is becoming bradycardic occasionally. This patients CHA2DS2-VASc Score and unadjusted Ischemic Stroke Rate (% per year) is equal to 7.2 % stroke rate/year from a score of 5 for HTN, DM,  CAD and 2 points for previous CVA. Above score calculated as 1 point each if present [CHF, HTN, DM, Vascular=MI/PAD/Aortic Plaque, Age if 65-74, or Male] Above score calculated as 2 points each if present [Age > 75, or Stroke/TIA/TE]  Diabetes mellitus type 2. Holding oral hypoglycemic agents and placing the patient on sliding scale insulin.  Essential hypertension. Continue home medication.  Confusion Likely secondary to the sepsis, hypoxia and acute illness. at the present evaluation the patient appears well oriented. Patient did have a fall earlier but a CT scan of the head is unremarkable for any active bleeding. This is resolved, back to his baseline.  Positive blood cultures One out of 2 blood cultures showed gram-positive cocci in clusters. Coagulase-negative staph and discontinue vancomycin.  Code Status: Full Code Family Communication: Discussed with granddaughter [name] over the phone Disposition Plan: Remains inpatient Diet: DIET DYS 3 Room service appropriate?: Yes; Fluid consistency:: Nectar Thick  Consultants:  None  Procedures:  None  Antibiotics:  Zosyn and vancomycin.   Objective: Filed Vitals:   03/04/15 1247  BP: 154/94  Pulse: 106  Temp:   Resp:     Intake/Output Summary (Last 24 hours) at 03/04/15 1316 Last data filed at 03/04/15 0945  Gross per 24 hour  Intake   1030 ml  Output    101 ml  Net    929 ml   Filed Weights   03/03/15 0500 03/03/15 1740 03/03/15 2046  Weight: 88.4 kg (194 lb 14.2 oz) 89.858 kg (198 lb 1.6 oz) 89.675 kg (197 lb 11.2 oz)    Exam: General: Alert and awake, oriented x3, not in any acute distress. HEENT: anicteric sclera, pupils reactive to light and accommodation, EOMI CVS: S1-S2 clear, no murmur rubs or gallops Chest: clear to auscultation bilaterally, no wheezing, rales or rhonchi Abdomen: soft nontender, nondistended, normal bowel sounds,  no organomegaly Extremities: no cyanosis, clubbing or edema noted  bilaterally Neuro: Cranial nerves II-XII intact, no focal neurological deficits  Data Reviewed: Basic Metabolic Panel:  Recent Labs Lab 03/01/15 1937 03/02/15 0730 03/04/15 0500  NA 134* 136 138  K 3.8 4.1 3.6  CL 104 101 108  CO2 23 22 22   GLUCOSE 164* 162* 256*  BUN 17 18 25*  CREATININE 1.36* 1.26* 1.28*  CALCIUM 8.3* 8.4* 8.6*  MG  --  1.4*  --    Liver Function Tests:  Recent Labs Lab 03/01/15 1937 03/02/15 0730  AST 15 22  ALT 9* 12*  ALKPHOS 25* 29*  BILITOT 1.5* 1.6*  PROT 6.2* 7.1  ALBUMIN 3.2* 3.5   No results for input(s): LIPASE, AMYLASE in the last 168 hours. No results for input(s): AMMONIA in the last 168 hours. CBC:  Recent Labs Lab 03/01/15 1937 03/02/15 0730 03/04/15 0500  WBC 7.7 8.1 6.2  NEUTROABS 6.0 6.3  --   HGB 12.3* 14.1 13.3  HCT 35.1* 40.6 38.1*  MCV 88.9 88.8 88.0  PLT 121* 108* 138*   Cardiac Enzymes: No results for input(s): CKTOTAL, CKMB, CKMBINDEX, TROPONINI in the last 168 hours. BNP (last 3 results) No results for input(s): BNP in the last 8760 hours.  ProBNP (last 3 results) No results for input(s): PROBNP in the last 8760 hours.  CBG:  Recent Labs Lab 03/03/15 1246 03/03/15 1751 03/04/15 0006 03/04/15 0509 03/04/15 1204  GLUCAP 227* 290* 219* 268* 203*    Micro Recent Results (from the past 240 hour(s))  Blood culture (routine x 2)     Status: None (Preliminary result)   Collection Time: 03/01/15  7:37 PM  Result Value Ref Range Status   Specimen Description BLOOD RIGHT ANTECUBITAL  Final   Special Requests BOTTLES DRAWN AEROBIC AND ANAEROBIC 10CC  Final   Culture NO GROWTH 2 DAYS  Final   Report Status PENDING  Incomplete  Urine culture     Status: None   Collection Time: 03/01/15  7:39 PM  Result Value Ref Range Status   Specimen Description URINE, CATHETERIZED  Final   Special Requests NONE  Final   Culture NO GROWTH 1 DAY  Final   Report Status 03/02/2015 FINAL  Final  Blood culture (routine  x 2)     Status: None   Collection Time: 03/01/15  7:43 PM  Result Value Ref Range Status   Specimen Description BLOOD RIGHT HAND  Final   Special Requests BOTTLES DRAWN AEROBIC AND ANAEROBIC 10CC  Final   Culture  Setup Time   Final    GRAM POSITIVE COCCI IN CLUSTERS AEROBIC BOTTLE ONLY CRITICAL RESULT CALLED TO, READ BACK BY AND VERIFIED WITH: Bridget Hartshorn RN 2026 03/02/15 A BROWNING    Culture   Final    STAPHYLOCOCCUS SPECIES (COAGULASE NEGATIVE) THE SIGNIFICANCE OF ISOLATING THIS ORGANISM FROM A SINGLE SET OF BLOOD CULTURES WHEN MULTIPLE SETS ARE DRAWN IS UNCERTAIN. PLEASE NOTIFY THE MICROBIOLOGY DEPARTMENT WITHIN ONE WEEK IF SPECIATION AND SENSITIVITIES ARE REQUIRED.    Report Status 03/04/2015 FINAL  Final  MRSA PCR Screening     Status: None   Collection Time: 03/02/15  1:53 AM  Result Value Ref Range Status   MRSA by PCR NEGATIVE NEGATIVE Final    Comment:        The GeneXpert MRSA Assay (FDA approved for NASAL specimens only), is one component of a comprehensive MRSA colonization surveillance program. It is not intended to diagnose MRSA  infection nor to guide or monitor treatment for MRSA infections.   Culture, sputum-assessment     Status: None   Collection Time: 03/02/15  7:14 AM  Result Value Ref Range Status   Specimen Description EXPECTORATED SPUTUM  Final   Special Requests NONE  Final   Sputum evaluation   Final    THIS SPECIMEN IS ACCEPTABLE. RESPIRATORY CULTURE REPORT TO FOLLOW.   Report Status 03/02/2015 FINAL  Final  Gram stain     Status: None   Collection Time: 03/02/15  7:14 AM  Result Value Ref Range Status   Specimen Description SPUTUM  Final   Special Requests NONE  Final   Gram Stain   Final    ABUNDANT WBC PRESENT, PREDOMINANTLY PMN FEW GRAM POSITIVE COCCI IN PAIRS RARE GRAM POSITIVE RODS RARE GRAM NEGATIVE RODS    Report Status 03/02/2015 FINAL  Final  Culture, respiratory (NON-Expectorated)     Status: None (Preliminary result)   Collection  Time: 03/02/15  7:14 AM  Result Value Ref Range Status   Specimen Description SPUTUM  Final   Special Requests NONE  Final   Gram Stain   Final    ABUNDANT WBC PRESENT,BOTH PMN AND MONONUCLEAR RARE SQUAMOUS EPITHELIAL CELLS PRESENT FEW GRAM POSITIVE COCCI IN PAIRS RARE GRAM POSITIVE RODS RARE GRAM NEGATIVE RODS    Culture   Final    Culture reincubated for better growth Performed at [facility]    Report Status PENDING  Incomplete     Studies: Dg Swallowing Func-speech Pathology  03/03/2015    Objective Swallowing Evaluation:    Patient Details  Name: Harold Jones MRN: [id number] Date of Birth: Dec 01, 1927  Today's Date: 03/03/2015 Time: SLP Start Time (ACUTE ONLY): 1200-SLP Stop Time (ACUTE ONLY): 1225 SLP Time Calculation (min) (ACUTE ONLY): 25 min  Past Medical History:  Past Medical History  Diagnosis Date  . Hypertension   . Diabetes mellitus   . FIBRILLATION, ATRIAL 08/31/2002    Qualifier: Diagnosis of  By: Barbaraann Barthel MD, [place]    . CVA 02/28/2004    Annotation: embolic Qualifier: Diagnosis of  By: Barbaraann Barthel MD, [place]    . COGNITIVE DEFICITS DUE CEREBROVASCULAR DISEASE 09/09/2009    Qualifier: Diagnosis of  By: Delrae Alfred MD, Lanora Manis    . CORONARY ARTERY DISEASE 02/27/2007    Qualifier: Diagnosis of  By: Barbaraann Barthel MD, [place]    . DYSLIPIDEMIA 02/27/2007    Qualifier: Diagnosis of  By: Barbaraann Barthel MD, [place]    . Bradycardia   . Aortic valve disorder    Past Surgical History:  Past Surgical History  Procedure Laterality Date  . Cardiac surgery     HPI:  Other Pertinent Information: Harold Jones is a 81 y.o. male with Past  medical history of hypertension, diabetes mellitus, atrial fibrillation,  [place] VASC 8, coronary artery disease, CVA aortic replacement. Admitted  with confusion, SOB, cough for two weeks with sputum. CXR shows New  consolidative opacity within the right mid lung concerning for  No Data Recorded  Assessment / Plan / Recommendation CHL IP CLINICAL IMPRESSIONS 03/03/2015   Therapy Diagnosis Moderate oral phase dysphagia;Moderate pharyngeal phase  dysphagia  Clinical Impression Pt demonstrates a moderate oral and oropharyngeal   dysphagia with decreased bolus cohesion and premature spillage of liquids  to the pharynx. Aspiration/penetration occurs before and after the  swallow, when trace residuals spill to the vestibule. The pt typically  responds with a throat clear, though this does not mobilize tracheal  aspirate. Chin tuck  ineffective. Recommend nectar thick liquids as these  were tolerated well as long as pt did not take overlarge sips and will  rduce pts risk of aspiration pna.  Will follow for tolerance and to  reinforce precautions with pt and family.       CHL IP TREATMENT RECOMMENDATION 03/03/2015  Treatment Recommendations Therapy as outlined in treatment plan below     CHL IP DIET RECOMMENDATION 03/03/2015  SLP Diet Recommendations Dysphagia 3 (Mech soft);Nectar  Liquid Administration via (None)  Medication Administration Whole meds with puree  Compensations Slow rate;Small sips/bites  Postural Changes and/or Swallow Maneuvers (None)     CHL IP OTHER RECOMMENDATIONS 03/03/2015  Recommended Consults (None)  Oral Care Recommendations Oral care BID  Other Recommendations Order thickener from pharmacy     No flowsheet data found.   CHL IP FREQUENCY AND DURATION 03/03/2015  Speech Therapy Frequency (ACUTE ONLY) min 2x/week  Treatment Duration 2 weeks     Pertinent Vitals/Pain NA    SLP Swallow Goals  No flowsheet data found.  No flowsheet data found.         Harold Jones, Riley Nearing 03/03/2015, 2:31 PM     Scheduled Meds: . antiseptic oral rinse  7 mL Mouth Rinse BID  . enalapril  20 mg Oral Daily  . gabapentin  300 mg Oral BID  . insulin aspart  0-9 Units Subcutaneous Q6H  . piperacillin-tazobactam (ZOSYN)  IV  3.375 g Intravenous Q8H  . pravastatin  40 mg Oral q1800  . rivaroxaban  15 mg Oral Q supper  . tamsulosin  0.4 mg Oral QPC supper  . vancomycin  750 mg Intravenous  Q12H   Continuous Infusions:      Time spent: 35 minutes    [facility] A  [facility] Pager [phone] If 7PM-7AM, please contact night-coverage at [email or web address], password [facility] 03/04/2015, 1:16 PM  LOS: 3 days

## 2015-03-04 NOTE — Progress Notes (Signed)
RN notified that patient had 27 runs of v-tach. MD notified. Awaiting any further orders.

## 2015-03-04 NOTE — Progress Notes (Signed)
Verbal orders to get an EKG.

## 2015-03-05 LAB — GLUCOSE, CAPILLARY
GLUCOSE-CAPILLARY: 227 mg/dL — AB (ref 65–99)
GLUCOSE-CAPILLARY: 288 mg/dL — AB (ref 65–99)
Glucose-Capillary: 201 mg/dL — ABNORMAL HIGH (ref 65–99)
Glucose-Capillary: 299 mg/dL — ABNORMAL HIGH (ref 65–99)

## 2015-03-05 LAB — CULTURE, RESPIRATORY W GRAM STAIN: Culture: NORMAL

## 2015-03-05 LAB — CULTURE, RESPIRATORY

## 2015-03-05 MED ORDER — GUAIFENESIN ER 600 MG PO TB12
1200.0000 mg | ORAL_TABLET | Freq: Two times a day (BID) | ORAL | Status: DC
  Administered 2015-03-05 – 2015-03-07 (×4): 1200 mg via ORAL
  Filled 2015-03-05 (×4): qty 2

## 2015-03-05 NOTE — Progress Notes (Addendum)
[facility] PROGRESS NOTE   Chandlar Jacklynn Bue ZOX:[id number] DOB: 1928/04/15 DOA: 03/01/2015 PCP: Julieanne Manson, MD  HPI/Subjective: Denies any fever or chills, appears to be a bit stronger than yesterday. Continue treatment. May be PT to reevaluate him. Patient stays at home with his wife she is 79 year old. Reportedly patient has runs of V. tach last night. Reviewed telemetry and EKGs looks like he has runs of tachycardia with aberrant conduction  Assessment/Plan: Principal Problem:   CAP (community acquired pneumonia) Active Problems:   Essential hypertension   FIBRILLATION, ATRIAL   Cerebral artery occlusion with cerebral infarction   H/O aortic valve replacement   Encephalopathy acute   Diabetes mellitus, type 2   Acute respiratory failure Presented with shortness of breath and hypoxia. Patient required 4 L in the emergency department to keep his saturation above 90%. This is likely secondary to the community-acquired pneumonia. Improving, currently off of oxygen.  Community acquired versus aspiration pneumonia Presented with right-sided chest pain, SOB and productive cough of yellow sputum Questionable aspiration, patient reported choking episodes at home. Started on Zosyn and vancomycin, discontinue vancomycin. Continue Zosyn.  Dysphagia SLP saw the patient and recommended nothing by mouth for now as he was not cooperating. Per SOB this is partly because of language barrier and confusion. Is doing very okay, dysphagia 3 with nectar thick liquids  Sepsis Patient presented to the hospital with temperature 102.5, RR of 22 and presence of pneumonia. Patient treated with broad-spectrum antibiotics Zosyn and vancomycin. Treated with hydration with IV fluids.  Atrial fibrillation, chronic anticoagulation. Since the patient is therapeutic anticoagulation possibility of PE is less likely. Continuing home medication patient is becoming bradycardic  occasionally. This patients CHA2DS2-VASc Score and unadjusted Ischemic Stroke Rate (% per year) is equal to 7.2 % stroke rate/year from a score of 5 for HTN, DM, CAD and 2 points for previous CVA. Above score calculated as 1 point each if present [CHF, HTN, DM, Vascular=MI/PAD/Aortic Plaque, Age if 65-74, or Male] Above score calculated as 2 points each if present [Age > 75, or Stroke/TIA/TE]  Diabetes mellitus type 2. Holding oral hypoglycemic agents and placing the patient on sliding scale insulin.  Essential hypertension. Continue home medication.  Confusion Likely secondary to the sepsis, hypoxia and acute illness. at the present evaluation the patient appears well oriented. Patient did have a fall earlier but a CT scan of the head is unremarkable for any active bleeding. This is resolved, back to his baseline.  Positive blood cultures One out of 2 blood cultures showed gram-positive cocci in clusters. Coagulase-negative staph and discontinue vancomycin.   Code Status: Full Code Family Communication: Discussed with granddaughter [name] over the phone Disposition Plan: Remains inpatient Diet: DIET DYS 3 Room service appropriate?: Yes; Fluid consistency:: Nectar Thick  Consultants:  None  Procedures:  None  Antibiotics:  Zosyn and vancomycin.   Objective: Filed Vitals:   03/05/15 0844  BP: 140/80  Pulse: 103  Temp: 97.9 F (36.6 C)  Resp: 18    Intake/Output Summary (Last 24 hours) at 03/05/15 1117 Last data filed at 03/05/15 0844  Gross per 24 hour  Intake    770 ml  Output    150 ml  Net    620 ml   Filed Weights   03/03/15 1740 03/03/15 2046 03/04/15 2032  Weight: 89.858 kg (198 lb 1.6 oz) 89.675 kg (197 lb 11.2 oz) 88.905 kg (196 lb)    Exam: General: Alert and awake, oriented x3, not in any acute distress. HEENT:  anicteric sclera, pupils reactive to light and accommodation, EOMI CVS: S1-S2 clear, no murmur rubs or gallops Chest: clear to  auscultation bilaterally, no wheezing, rales or rhonchi Abdomen: soft nontender, nondistended, normal bowel sounds, no organomegaly Extremities: no cyanosis, clubbing or edema noted bilaterally Neuro: Cranial nerves II-XII intact, no focal neurological deficits  Data Reviewed: Basic Metabolic Panel:  Recent Labs Lab 03/01/15 1937 03/02/15 0730 03/04/15 0500  NA 134* 136 138  K 3.8 4.1 3.6  CL 104 101 108  CO2 23 22 22   GLUCOSE 164* 162* 256*  BUN 17 18 25*  CREATININE 1.36* 1.26* 1.28*  CALCIUM 8.3* 8.4* 8.6*  MG  --  1.4*  --    Liver Function Tests:  Recent Labs Lab 03/01/15 1937 03/02/15 0730  AST 15 22  ALT 9* 12*  ALKPHOS 25* 29*  BILITOT 1.5* 1.6*  PROT 6.2* 7.1  ALBUMIN 3.2* 3.5   No results for input(s): LIPASE, AMYLASE in the last 168 hours. No results for input(s): AMMONIA in the last 168 hours. CBC:  Recent Labs Lab 03/01/15 1937 03/02/15 0730 03/04/15 0500  WBC 7.7 8.1 6.2  NEUTROABS 6.0 6.3  --   HGB 12.3* 14.1 13.3  HCT 35.1* 40.6 38.1*  MCV 88.9 88.8 88.0  PLT 121* 108* 138*   Cardiac Enzymes: No results for input(s): CKTOTAL, CKMB, CKMBINDEX, TROPONINI in the last 168 hours. BNP (last 3 results) No results for input(s): BNP in the last 8760 hours.  ProBNP (last 3 results) No results for input(s): PROBNP in the last 8760 hours.  CBG:  Recent Labs Lab 03/04/15 1632 03/04/15 2031 03/04/15 2347 03/05/15 0352 03/05/15 0841  GLUCAP 306* 215* 173* 201* 227*    Micro Recent Results (from the past 240 hour(s))  Blood culture (routine x 2)     Status: None (Preliminary result)   Collection Time: 03/01/15  7:37 PM  Result Value Ref Range Status   Specimen Description BLOOD RIGHT ANTECUBITAL  Final   Special Requests BOTTLES DRAWN AEROBIC AND ANAEROBIC 10CC  Final   Culture NO GROWTH 4 DAYS  Final   Report Status PENDING  Incomplete  Urine culture     Status: None   Collection Time: 03/01/15  7:39 PM  Result Value Ref Range Status    Specimen Description URINE, CATHETERIZED  Final   Special Requests NONE  Final   Culture NO GROWTH 1 DAY  Final   Report Status 03/02/2015 FINAL  Final  Blood culture (routine x 2)     Status: None   Collection Time: 03/01/15  7:43 PM  Result Value Ref Range Status   Specimen Description BLOOD RIGHT HAND  Final   Special Requests BOTTLES DRAWN AEROBIC AND ANAEROBIC 10CC  Final   Culture  Setup Time   Final    GRAM POSITIVE COCCI IN CLUSTERS AEROBIC BOTTLE ONLY CRITICAL RESULT CALLED TO, READ BACK BY AND VERIFIED WITH: Bridget Hartshorn RN 2026 03/02/15 A BROWNING    Culture   Final    STAPHYLOCOCCUS SPECIES (COAGULASE NEGATIVE) THE SIGNIFICANCE OF ISOLATING THIS ORGANISM FROM A SINGLE SET OF BLOOD CULTURES WHEN MULTIPLE SETS ARE DRAWN IS UNCERTAIN. PLEASE NOTIFY THE MICROBIOLOGY DEPARTMENT WITHIN ONE WEEK IF SPECIATION AND SENSITIVITIES ARE REQUIRED.    Report Status 03/04/2015 FINAL  Final  MRSA PCR Screening     Status: None   Collection Time: 03/02/15  1:53 AM  Result Value Ref Range Status   MRSA by PCR NEGATIVE NEGATIVE Final    Comment:  The GeneXpert MRSA Assay (FDA approved for NASAL specimens only), is one component of a comprehensive MRSA colonization surveillance program. It is not intended to diagnose MRSA infection nor to guide or monitor treatment for MRSA infections.   Culture, sputum-assessment     Status: None   Collection Time: 03/02/15  7:14 AM  Result Value Ref Range Status   Specimen Description EXPECTORATED SPUTUM  Final   Special Requests NONE  Final   Sputum evaluation   Final    THIS SPECIMEN IS ACCEPTABLE. RESPIRATORY CULTURE REPORT TO FOLLOW.   Report Status 03/02/2015 FINAL  Final  Gram stain     Status: None   Collection Time: 03/02/15  7:14 AM  Result Value Ref Range Status   Specimen Description SPUTUM  Final   Special Requests NONE  Final   Gram Stain   Final    ABUNDANT WBC PRESENT, PREDOMINANTLY PMN FEW GRAM POSITIVE COCCI IN PAIRS RARE  GRAM POSITIVE RODS RARE GRAM NEGATIVE RODS    Report Status 03/02/2015 FINAL  Final  Culture, respiratory (NON-Expectorated)     Status: None (Preliminary result)   Collection Time: 03/02/15  7:14 AM  Result Value Ref Range Status   Specimen Description SPUTUM  Final   Special Requests NONE  Final   Gram Stain   Final    ABUNDANT WBC PRESENT,BOTH PMN AND MONONUCLEAR RARE SQUAMOUS EPITHELIAL CELLS PRESENT FEW GRAM POSITIVE COCCI IN PAIRS RARE GRAM POSITIVE RODS RARE GRAM NEGATIVE RODS    Culture   Final    Culture reincubated for better growth Performed at [facility]    Report Status PENDING  Incomplete     Studies: Dg Swallowing Func-speech Pathology  03/03/2015    Objective Swallowing Evaluation:    Patient Details  Name: Steadman Prosperi MRN: [id number] Date of Birth: 01-09-1928  Today's Date: 03/03/2015 Time: SLP Start Time (ACUTE ONLY): 1200-SLP Stop Time (ACUTE ONLY): 1225 SLP Time Calculation (min) (ACUTE ONLY): 25 min  Past Medical History:  Past Medical History  Diagnosis Date  . Hypertension   . Diabetes mellitus   . FIBRILLATION, ATRIAL 08/31/2002    Qualifier: Diagnosis of  By: Barbaraann Barthel MD, [place]    . CVA 02/28/2004    Annotation: embolic Qualifier: Diagnosis of  By: Barbaraann Barthel MD, [place]    . COGNITIVE DEFICITS DUE CEREBROVASCULAR DISEASE 09/09/2009    Qualifier: Diagnosis of  By: Delrae Alfred MD, Lanora Manis    . CORONARY ARTERY DISEASE 02/27/2007    Qualifier: Diagnosis of  By: Barbaraann Barthel MD, [place]    . DYSLIPIDEMIA 02/27/2007    Qualifier: Diagnosis of  By: Barbaraann Barthel MD, [place]    . Bradycardia   . Aortic valve disorder    Past Surgical History:  Past Surgical History  Procedure Laterality Date  . Cardiac surgery     HPI:  Other Pertinent Information: Motty Even is a 81 y.o. male with Past  medical history of hypertension, diabetes mellitus, atrial fibrillation,  [place] VASC 8, coronary artery disease, CVA aortic replacement. Admitted  with confusion, SOB, cough for two weeks  with sputum. CXR shows New  consolidative opacity within the right mid lung concerning for  No Data Recorded  Assessment / Plan / Recommendation CHL IP CLINICAL IMPRESSIONS 03/03/2015  Therapy Diagnosis Moderate oral phase dysphagia;Moderate pharyngeal phase  dysphagia  Clinical Impression Pt demonstrates a moderate oral and oropharyngeal   dysphagia with decreased bolus cohesion and premature spillage of liquids  to the pharynx. Aspiration/penetration occurs before and after the  swallow, when trace residuals spill to the vestibule. The pt typically  responds with a throat clear, though this does not mobilize tracheal  aspirate. Chin tuck ineffective. Recommend nectar thick liquids as these  were tolerated well as long as pt did not take overlarge sips and will  rduce pts risk of aspiration pna.  Will follow for tolerance and to  reinforce precautions with pt and family.       CHL IP TREATMENT RECOMMENDATION 03/03/2015  Treatment Recommendations Therapy as outlined in treatment plan below     CHL IP DIET RECOMMENDATION 03/03/2015  SLP Diet Recommendations Dysphagia 3 (Mech soft);Nectar  Liquid Administration via (None)  Medication Administration Whole meds with puree  Compensations Slow rate;Small sips/bites  Postural Changes and/or Swallow Maneuvers (None)     CHL IP OTHER RECOMMENDATIONS 03/03/2015  Recommended Consults (None)  Oral Care Recommendations Oral care BID  Other Recommendations Order thickener from pharmacy     No flowsheet data found.   CHL IP FREQUENCY AND DURATION 03/03/2015  Speech Therapy Frequency (ACUTE ONLY) min 2x/week  Treatment Duration 2 weeks     Pertinent Vitals/Pain NA    SLP Swallow Goals  No flowsheet data found.  No flowsheet data found.         DeBlois, Riley Nearing 03/03/2015, 2:31 PM     Scheduled Meds: . antiseptic oral rinse  7 mL Mouth Rinse BID  . enalapril  20 mg Oral Daily  . gabapentin  300 mg Oral BID  . insulin aspart  0-9 Units Subcutaneous Q6H  . piperacillin-tazobactam  (ZOSYN)  IV  3.375 g Intravenous Q8H  . pravastatin  40 mg Oral q1800  . rivaroxaban  15 mg Oral Q supper  . tamsulosin  0.4 mg Oral QPC supper   Continuous Infusions:      Time spent: 35 minutes    [facility] A  [facility] Pager [phone] If 7PM-7AM, please contact night-coverage at [email or web address], password [facility] 03/05/2015, 11:17 AM  LOS: 4 days

## 2015-03-06 LAB — CBC
HCT: 37.1 % — ABNORMAL LOW (ref 39.0–52.0)
Hemoglobin: 12.6 g/dL — ABNORMAL LOW (ref 13.0–17.0)
MCH: 30.2 pg (ref 26.0–34.0)
MCHC: 34 g/dL (ref 30.0–36.0)
MCV: 89 fL (ref 78.0–100.0)
PLATELETS: 166 10*3/uL (ref 150–400)
RBC: 4.17 MIL/uL — AB (ref 4.22–5.81)
RDW: 13.2 % (ref 11.5–15.5)
WBC: 5.3 10*3/uL (ref 4.0–10.5)

## 2015-03-06 LAB — BASIC METABOLIC PANEL
Anion gap: 8 (ref 5–15)
BUN: 20 mg/dL (ref 6–20)
CALCIUM: 9 mg/dL (ref 8.9–10.3)
CO2: 23 mmol/L (ref 22–32)
CREATININE: 1.13 mg/dL (ref 0.61–1.24)
Chloride: 110 mmol/L (ref 101–111)
GFR calc Af Amer: >60 mL/min (ref >60)
GFR, EST NON AFRICAN AMERICAN: 57 mL/min — AB (ref >60)
GLUCOSE: 190 mg/dL — AB (ref 65–99)
POTASSIUM: 3.3 mmol/L — AB (ref 3.5–5.1)
SODIUM: 141 mmol/L (ref 135–145)

## 2015-03-06 LAB — GLUCOSE, CAPILLARY
Glucose-Capillary: 203 mg/dL — ABNORMAL HIGH (ref 65–99)
Glucose-Capillary: 209 mg/dL — ABNORMAL HIGH (ref 65–99)
Glucose-Capillary: 220 mg/dL — ABNORMAL HIGH (ref 65–99)
Glucose-Capillary: 297 mg/dL — ABNORMAL HIGH (ref 65–99)
Glucose-Capillary: 318 mg/dL — ABNORMAL HIGH (ref 65–99)

## 2015-03-06 LAB — CULTURE, BLOOD (ROUTINE X 2): Culture: NO GROWTH

## 2015-03-06 LAB — MAGNESIUM: MAGNESIUM: 1.8 mg/dL (ref 1.7–2.4)

## 2015-03-06 MED ORDER — POTASSIUM CHLORIDE CRYS ER 20 MEQ PO TBCR
60.0000 meq | EXTENDED_RELEASE_TABLET | Freq: Once | ORAL | Status: AC
  Administered 2015-03-06: 60 meq via ORAL
  Filled 2015-03-06: qty 3

## 2015-03-06 MED ORDER — AMOXICILLIN-POT CLAVULANATE 875-125 MG PO TABS
1.0000 | ORAL_TABLET | Freq: Two times a day (BID) | ORAL | Status: DC
  Administered 2015-03-06 – 2015-03-07 (×3): 1 via ORAL
  Filled 2015-03-06 (×3): qty 1

## 2015-03-06 NOTE — Progress Notes (Signed)
[facility] PROGRESS NOTE   Desiree Jacklynn Bue ZOX:[id number] DOB: [birth year] DOA: 03/01/2015 PCP: Julieanne Manson, MD  HPI/Subjective: Has difficulty communicating as he is not speaking any [place]. Reviewed telemetry and EKGs looks like he has runs of tachycardia with aberrant conduction. Patient likely needs a skilled nursing facility on discharge, social work consulted, likely this is to happen after the weekend.  Assessment/Plan: Principal Problem:   CAP (community acquired pneumonia) Active Problems:   Essential hypertension   FIBRILLATION, ATRIAL   Cerebral artery occlusion with cerebral infarction   H/O aortic valve replacement   Encephalopathy acute   Diabetes mellitus, type 2   Acute respiratory failure Presented with shortness of breath and hypoxia. Patient required 4 L in the emergency department to keep his saturation above 90%. This is likely secondary to the community-acquired pneumonia. Improving, currently off of oxygen.   Community acquired versus aspiration pneumonia Presented with right-sided chest pain, SOB and productive cough of yellow sputum Questionable aspiration, patient reported choking episodes at home. Started on Zosyn and vancomycin, discontinue vancomycin. Continue Zosyn.  Dysphagia SLP saw the patient and recommended nothing by mouth for now as he was not cooperating. Per SOB this is partly because of language barrier and confusion. Is doing very okay, dysphagia 3 with nectar thick liquids  Sepsis Patient presented to the hospital with temperature 102.5, RR of 22 and presence of pneumonia. Patient treated with broad-spectrum antibiotics Zosyn and vancomycin. Treated with hydration with IV fluids.  Atrial fibrillation, chronic anticoagulation. Since the patient is therapeutic anticoagulation possibility of PE is less likely. Continuing home medication patient is becoming bradycardic occasionally. This patients CHA2DS2-VASc Score  and unadjusted Ischemic Stroke Rate (% per year) is equal to 7.2 % stroke rate/year from a score of 5 for HTN, DM, CAD and 2 points for previous CVA. Above score calculated as 1 point each if present [CHF, HTN, DM, Vascular=MI/PAD/Aortic Plaque, Age if 65-74, or Male] Above score calculated as 2 points each if present [Age > 75, or Stroke/TIA/TE]  Diabetes mellitus type 2. Holding oral hypoglycemic agents and placing the patient on sliding scale insulin.  Essential hypertension. Continue home medication.  Confusion Likely secondary to the sepsis, hypoxia and acute illness. at the present evaluation the patient appears well oriented. Patient did have a fall earlier but a CT scan of the head is unremarkable for any active bleeding. This is resolved, back to his baseline.  Positive blood cultures One out of 2 blood cultures showed gram-positive cocci in clusters. Coagulase-negative staphwhich is likely contaminant, vancomycin discontinued.  Code Status: Full Code Family Communication: Discussed with granddaughter [name] over the phone Disposition Plan: Remains inpatient Diet: DIET DYS 3 Room service appropriate?: Yes; Fluid consistency:: Nectar Thick  Consultants:  None  Procedures:  None  Antibiotics:  Zosyn and vancomycin.   Objective: Filed Vitals:   03/06/15 1047  BP:   Pulse: 92  Temp:   Resp:     Intake/Output Summary (Last 24 hours) at 03/06/15 1124 Last data filed at 03/06/15 1047  Gross per 24 hour  Intake   1190 ml  Output   1625 ml  Net   -435 ml   Filed Weights   03/03/15 1740 03/03/15 2046 03/04/15 2032  Weight: 89.858 kg (198 lb 1.6 oz) 89.675 kg (197 lb 11.2 oz) 88.905 kg (196 lb)    Exam: General: Alert and awake, oriented x3, not in any acute distress. HEENT: anicteric sclera, pupils reactive to light and accommodation, EOMI CVS: S1-S2 clear, no murmur  rubs or gallops Chest: clear to auscultation bilaterally, no wheezing, rales or  rhonchi Abdomen: soft nontender, nondistended, normal bowel sounds, no organomegaly Extremities: no cyanosis, clubbing or edema noted bilaterally Neuro: Cranial nerves II-XII intact, no focal neurological deficits  Data Reviewed: Basic Metabolic Panel:  Recent Labs Lab 03/01/15 1937 03/02/15 0730 03/04/15 0500 03/06/15 0542  NA 134* 136 138 141  K 3.8 4.1 3.6 3.3*  CL 104 101 108 110  CO2 GLUCOSE 164* 162* 256* 190*  BUN 17 18 25* 20  CREATININE 1.36* 1.26* 1.28* 1.13  CALCIUM 8.3* 8.4* 8.6* 9.0  MG  --  1.4*  --  1.8   Liver Function Tests:  Recent Labs Lab 03/01/15 1937 03/02/15 0730  AST 15 22  ALT 9* 12*  ALKPHOS 25* 29*  BILITOT 1.5* 1.6*  PROT 6.2* 7.1  ALBUMIN 3.2* 3.5   No results for input(s): LIPASE, AMYLASE in the last 168 hours. No results for input(s): AMMONIA in the last 168 hours. CBC:  Recent Labs Lab 03/01/15 1937 03/02/15 0730 03/04/15 0500 03/06/15 0542  WBC 7.7 8.1 6.2 5.3  NEUTROABS 6.0 6.3  --   --   HGB 12.3* 14.1 13.3 12.6*  HCT 35.1* 40.6 38.1* 37.1*  MCV 88.9 88.8 88.0 89.0  PLT 121* 108* 138* 166   Cardiac Enzymes: No results for input(s): CKTOTAL, CKMB, CKMBINDEX, TROPONINI in the last 168 hours. BNP (last 3 results) No results for input(s): BNP in the last 8760 hours.  ProBNP (last 3 results) No results for input(s): PROBNP in the last 8760 hours.  CBG:  Recent Labs Lab 03/05/15 0841 03/05/15 1200 03/05/15 1708 03/06/15 0008 03/06/15 0559  GLUCAP 227* 288* 299* 297* 203*    Micro Recent Results (from the past 240 hour(s))  Blood culture (routine x 2)     Status: None (Preliminary result)   Collection Time: 03/01/15  7:37 PM  Result Value Ref Range Status   Specimen Description BLOOD RIGHT ANTECUBITAL  Final   Special Requests BOTTLES DRAWN AEROBIC AND ANAEROBIC 10CC  Final   Culture NO GROWTH 4 DAYS  Final   Report Status PENDING  Incomplete  Urine culture     Status: None   Collection Time:  03/01/15  7:39 PM  Result Value Ref Range Status   Specimen Description URINE, CATHETERIZED  Final   Special Requests NONE  Final   Culture NO GROWTH 1 DAY  Final   Report Status 03/02/2015 FINAL  Final  Blood culture (routine x 2)     Status: None   Collection Time: 03/01/15  7:43 PM  Result Value Ref Range Status   Specimen Description BLOOD RIGHT HAND  Final   Special Requests BOTTLES DRAWN AEROBIC AND ANAEROBIC 10CC  Final   Culture  Setup Time   Final    GRAM POSITIVE COCCI IN CLUSTERS AEROBIC BOTTLE ONLY CRITICAL RESULT CALLED TO, READ BACK BY AND VERIFIED WITH: Bridget Hartshorn RN 2026 03/02/15 A BROWNING    Culture   Final    STAPHYLOCOCCUS SPECIES (COAGULASE NEGATIVE) THE SIGNIFICANCE OF ISOLATING THIS ORGANISM FROM A SINGLE SET OF BLOOD CULTURES WHEN MULTIPLE SETS ARE DRAWN IS UNCERTAIN. PLEASE NOTIFY THE MICROBIOLOGY DEPARTMENT WITHIN ONE WEEK IF SPECIATION AND SENSITIVITIES ARE REQUIRED.    Report Status 03/04/2015 FINAL  Final  MRSA PCR Screening     Status: None   Collection Time: 03/02/15  1:53 AM  Result Value Ref Range Status   MRSA by PCR  NEGATIVE NEGATIVE Final    Comment:        The GeneXpert MRSA Assay (FDA approved for NASAL specimens only), is one component of a comprehensive MRSA colonization surveillance program. It is not intended to diagnose MRSA infection nor to guide or monitor treatment for MRSA infections.   Culture, sputum-assessment     Status: None   Collection Time: 03/02/15  7:14 AM  Result Value Ref Range Status   Specimen Description EXPECTORATED SPUTUM  Final   Special Requests NONE  Final   Sputum evaluation   Final    THIS SPECIMEN IS ACCEPTABLE. RESPIRATORY CULTURE REPORT TO FOLLOW.   Report Status 03/02/2015 FINAL  Final  Gram stain     Status: None   Collection Time: 03/02/15  7:14 AM  Result Value Ref Range Status   Specimen Description SPUTUM  Final   Special Requests NONE  Final   Gram Stain   Final    ABUNDANT WBC PRESENT,  PREDOMINANTLY PMN FEW GRAM POSITIVE COCCI IN PAIRS RARE GRAM POSITIVE RODS RARE GRAM NEGATIVE RODS    Report Status 03/02/2015 FINAL  Final  Culture, respiratory (NON-Expectorated)     Status: None   Collection Time: 03/02/15  7:14 AM  Result Value Ref Range Status   Specimen Description SPUTUM  Final   Special Requests NONE  Final   Gram Stain   Final    ABUNDANT WBC PRESENT,BOTH PMN AND MONONUCLEAR RARE SQUAMOUS EPITHELIAL CELLS PRESENT FEW GRAM POSITIVE COCCI IN PAIRS RARE GRAM POSITIVE RODS RARE GRAM NEGATIVE RODS    Culture   Final    NORMAL OROPHARYNGEAL FLORA Performed at [facility]    Report Status 03/05/2015 FINAL  Final     Studies: No results found.  Scheduled Meds: . antiseptic oral rinse  7 mL Mouth Rinse BID  . enalapril  20 mg Oral Daily  . gabapentin  300 mg Oral BID  . guaiFENesin  1,200 mg Oral BID  . insulin aspart  0-9 Units Subcutaneous Q6H  . piperacillin-tazobactam (ZOSYN)  IV  3.375 g Intravenous Q8H  . pravastatin  40 mg Oral q1800  . rivaroxaban  15 mg Oral Q supper  . tamsulosin  0.4 mg Oral QPC supper   Continuous Infusions:      Time spent: 35 minutes    [facility] A  [facility] Pager [phone] If 7PM-7AM, please contact night-coverage at [email or web address], password [facility] 03/06/2015, 11:24 AM  LOS: 5 days

## 2015-03-07 LAB — BASIC METABOLIC PANEL
Anion gap: 8 (ref 5–15)
BUN: 21 mg/dL — AB (ref 6–20)
CALCIUM: 9.5 mg/dL (ref 8.9–10.3)
CO2: 24 mmol/L (ref 22–32)
CREATININE: 1.17 mg/dL (ref 0.61–1.24)
Chloride: 110 mmol/L (ref 101–111)
GFR calc Af Amer: >60 mL/min (ref >60)
GFR, EST NON AFRICAN AMERICAN: 55 mL/min — AB (ref >60)
Glucose, Bld: 259 mg/dL — ABNORMAL HIGH (ref 65–99)
Potassium: 4.3 mmol/L (ref 3.5–5.1)
SODIUM: 142 mmol/L (ref 135–145)

## 2015-03-07 LAB — GLUCOSE, CAPILLARY
GLUCOSE-CAPILLARY: 166 mg/dL — AB (ref 65–99)
GLUCOSE-CAPILLARY: 251 mg/dL — AB (ref 65–99)
Glucose-Capillary: 313 mg/dL — ABNORMAL HIGH (ref 65–99)

## 2015-03-07 MED ORDER — AMOXICILLIN-POT CLAVULANATE 875-125 MG PO TABS: 1.0000 | ORAL_TABLET | Freq: Two times a day (BID) | ORAL | Status: DC

## 2015-03-07 NOTE — Progress Notes (Signed)
Report called to Brandi at [facility].  All questions answered.

## 2015-03-07 NOTE — Progress Notes (Signed)
[facility] PROGRESS NOTE   Atsushi Jacklynn Bue WUJ:[id number] DOB: May 22, 1928 DOA: 03/01/2015 PCP: Julieanne Manson, MD  HPI/Subjective: Has difficulty communicating as he is not speaking any [place]. Seen was washing himself at bedside, still await SNF bed, placed on oral Augmentin.  Assessment/Plan: Principal Problem:   CAP (community acquired pneumonia) Active Problems:   Essential hypertension   FIBRILLATION, ATRIAL   Cerebral artery occlusion with cerebral infarction   H/O aortic valve replacement   Encephalopathy acute   Diabetes mellitus, type 2   Acute respiratory failure Presented with shortness of breath and hypoxia. Patient required 4 L in the emergency department to keep his saturation above 90%. This is likely secondary to the community-acquired pneumonia. Improving, currently off of oxygen.   Community acquired versus aspiration pneumonia Presented with right-sided chest pain, SOB and productive cough of yellow sputum Questionable aspiration, patient reported choking episodes at home. Started on Zosyn and vancomycin, discontinue vancomycin. Continue Zosyn.  Dysphagia SLP saw the patient and recommended nothing by mouth for now as he was not cooperating. Per SOB this is partly because of language barrier and confusion. Is doing very okay, dysphagia 3 with nectar thick liquids  Sepsis Patient presented to the hospital with temperature 102.5, RR of 22 and presence of pneumonia. Patient treated with broad-spectrum antibiotics Zosyn and vancomycin. Treated with hydration with IV fluids.  Atrial fibrillation, chronic anticoagulation. Since the patient is therapeutic anticoagulation possibility of PE is less likely. Continuing home medication patient is becoming bradycardic occasionally. This patients CHA2DS2-VASc Score and unadjusted Ischemic Stroke Rate (% per year) is equal to 7.2 % stroke rate/year from a score of 5 for HTN, DM, CAD and 2 points for  previous CVA. Above score calculated as 1 point each if present [CHF, HTN, DM, Vascular=MI/PAD/Aortic Plaque, Age if 65-74, or Male] Above score calculated as 2 points each if present [Age > 75, or Stroke/TIA/TE]  Diabetes mellitus type 2. Holding oral hypoglycemic agents and placing the patient on sliding scale insulin.  Essential hypertension. Continue home medication.  Confusion Likely secondary to the sepsis, hypoxia and acute illness. at the present evaluation the patient appears well oriented. Patient did have a fall earlier but a CT scan of the head is unremarkable for any active bleeding. This is resolved, back to his baseline.  Positive blood cultures One out of 2 blood cultures showed gram-positive cocci in clusters. Coagulase-negative staphwhich is likely contaminant, vancomycin discontinued.  Code Status: Full Code Family Communication: Discussed with granddaughter [name] over the phone Disposition Plan: Remains inpatient Diet: DIET DYS 3 Room service appropriate?: Yes; Fluid consistency:: Nectar Thick  Consultants:  None  Procedures:  None  Antibiotics:  Zosyn and vancomycin.   Objective: Filed Vitals:   03/07/15 0903  BP: 155/75  Pulse: 99  Temp: 98.5 F (36.9 C)  Resp: 18    Intake/Output Summary (Last 24 hours) at 03/07/15 1248 Last data filed at 03/07/15 1108  Gross per 24 hour  Intake    610 ml  Output   1376 ml  Net   -766 ml   Filed Weights   03/03/15 2046 03/04/15 2032 03/06/15 2052  Weight: 89.675 kg (197 lb 11.2 oz) 88.905 kg (196 lb) 88.678 kg (195 lb 8 oz)    Exam: General: Alert and awake, oriented x3, not in any acute distress. HEENT: anicteric sclera, pupils reactive to light and accommodation, EOMI CVS: S1-S2 clear, no murmur rubs or gallops Chest: clear to auscultation bilaterally, no wheezing, rales or rhonchi Abdomen: soft nontender,  nondistended, normal bowel sounds, no organomegaly Extremities: no cyanosis, clubbing  or edema noted bilaterally Neuro: Cranial nerves II-XII intact, no focal neurological deficits  Data Reviewed: Basic Metabolic Panel:  Recent Labs Lab 03/01/15 1937 03/02/15 0730 03/04/15 0500 03/06/15 0542 03/07/15 0830  NA 134* 136 138 141 142  K 3.8 4.1 3.6 3.3* 4.3  CL 104 101 108 110 110  CO2 GLUCOSE 164* 162* 256* 190* 259*  BUN 17 18 25* 20 21*  CREATININE 1.36* 1.26* 1.28* 1.13 1.17  CALCIUM 8.3* 8.4* 8.6* 9.0 9.5  MG  --  1.4*  --  1.8  --    Liver Function Tests:  Recent Labs Lab 03/01/15 1937 03/02/15 0730  AST 15 22  ALT 9* 12*  ALKPHOS 25* 29*  BILITOT 1.5* 1.6*  PROT 6.2* 7.1  ALBUMIN 3.2* 3.5   No results for input(s): LIPASE, AMYLASE in the last 168 hours. No results for input(s): AMMONIA in the last 168 hours. CBC:  Recent Labs Lab 03/01/15 1937 03/02/15 0730 03/04/15 0500 03/06/15 0542  WBC 7.7 8.1 6.2 5.3  NEUTROABS 6.0 6.3  --   --   HGB 12.3* 14.1 13.3 12.6*  HCT 35.1* 40.6 38.1* 37.1*  MCV 88.9 88.8 88.0 89.0  PLT 121* 108* 138* 166   Cardiac Enzymes: No results for input(s): CKTOTAL, CKMB, CKMBINDEX, TROPONINI in the last 168 hours. BNP (last 3 results) No results for input(s): BNP in the last 8760 hours.  ProBNP (last 3 results) No results for input(s): PROBNP in the last 8760 hours.  CBG:  Recent Labs Lab 03/06/15 1141 03/06/15 1807 03/06/15 2356 03/07/15 0550 03/07/15 1144  GLUCAP 209* 220* 318* 166* 251*    Micro Recent Results (from the past 240 hour(s))  Blood culture (routine x 2)     Status: None   Collection Time: 03/01/15  7:37 PM  Result Value Ref Range Status   Specimen Description BLOOD RIGHT ANTECUBITAL  Final   Special Requests BOTTLES DRAWN AEROBIC AND ANAEROBIC 10CC  Final   Culture NO GROWTH 5 DAYS  Final   Report Status 03/06/2015 FINAL  Final  Urine culture     Status: None   Collection Time: 03/01/15  7:39 PM  Result Value Ref Range Status   Specimen Description URINE,  CATHETERIZED  Final   Special Requests NONE  Final   Culture NO GROWTH 1 DAY  Final   Report Status 03/02/2015 FINAL  Final  Blood culture (routine x 2)     Status: None   Collection Time: 03/01/15  7:43 PM  Result Value Ref Range Status   Specimen Description BLOOD RIGHT HAND  Final   Special Requests BOTTLES DRAWN AEROBIC AND ANAEROBIC 10CC  Final   Culture  Setup Time   Final    GRAM POSITIVE COCCI IN CLUSTERS AEROBIC BOTTLE ONLY CRITICAL RESULT CALLED TO, READ BACK BY AND VERIFIED WITH: Bridget Hartshorn RN 2026 03/02/15 A BROWNING    Culture   Final    STAPHYLOCOCCUS SPECIES (COAGULASE NEGATIVE) THE SIGNIFICANCE OF ISOLATING THIS ORGANISM FROM A SINGLE SET OF BLOOD CULTURES WHEN MULTIPLE SETS ARE DRAWN IS UNCERTAIN. PLEASE NOTIFY THE MICROBIOLOGY DEPARTMENT WITHIN ONE WEEK IF SPECIATION AND SENSITIVITIES ARE REQUIRED.    Report Status 03/04/2015 FINAL  Final  MRSA PCR Screening     Status: None   Collection Time: 03/02/15  1:53 AM  Result Value Ref Range Status   MRSA by PCR NEGATIVE NEGATIVE Final  Comment:        The GeneXpert MRSA Assay (FDA approved for NASAL specimens only), is one component of a comprehensive MRSA colonization surveillance program. It is not intended to diagnose MRSA infection nor to guide or monitor treatment for MRSA infections.   Culture, sputum-assessment     Status: None   Collection Time: 03/02/15  7:14 AM  Result Value Ref Range Status   Specimen Description EXPECTORATED SPUTUM  Final   Special Requests NONE  Final   Sputum evaluation   Final    THIS SPECIMEN IS ACCEPTABLE. RESPIRATORY CULTURE REPORT TO FOLLOW.   Report Status 03/02/2015 FINAL  Final  Gram stain     Status: None   Collection Time: 03/02/15  7:14 AM  Result Value Ref Range Status   Specimen Description SPUTUM  Final   Special Requests NONE  Final   Gram Stain   Final    ABUNDANT WBC PRESENT, PREDOMINANTLY PMN FEW GRAM POSITIVE COCCI IN PAIRS RARE GRAM POSITIVE RODS RARE GRAM  NEGATIVE RODS    Report Status 03/02/2015 FINAL  Final  Culture, respiratory (NON-Expectorated)     Status: None   Collection Time: 03/02/15  7:14 AM  Result Value Ref Range Status   Specimen Description SPUTUM  Final   Special Requests NONE  Final   Gram Stain   Final    ABUNDANT WBC PRESENT,BOTH PMN AND MONONUCLEAR RARE SQUAMOUS EPITHELIAL CELLS PRESENT FEW GRAM POSITIVE COCCI IN PAIRS RARE GRAM POSITIVE RODS RARE GRAM NEGATIVE RODS    Culture   Final    NORMAL OROPHARYNGEAL FLORA Performed at [facility]    Report Status 03/05/2015 FINAL  Final     Studies: No results found.  Scheduled Meds: . amoxicillin-clavulanate  1 tablet Oral Q12H  . antiseptic oral rinse  7 mL Mouth Rinse BID  . enalapril  20 mg Oral Daily  . gabapentin  300 mg Oral BID  . guaiFENesin  1,200 mg Oral BID  . insulin aspart  0-9 Units Subcutaneous Q6H  . pravastatin  40 mg Oral q1800  . rivaroxaban  15 mg Oral Q supper  . tamsulosin  0.4 mg Oral QPC supper   Continuous Infusions:      Time spent: 35 minutes    [facility] A  [facility] Pager [phone] If 7PM-7AM, please contact night-coverage at [email or web address], password [facility] 03/07/2015, 12:48 PM  LOS: 6 days

## 2015-03-07 NOTE — Clinical Social Work Note (Signed)
Clinical Social Work Assessment  Patient Details  Name: Kiante Ciavarella MRN: [id number] Date of Birth: [birth year]  Date of referral:  03/06/15               Reason for consult:  Facility Placement                Permission sought to share information with:  Family Supports (CSW talked with granddaughter L. [name] by phone. Patient speaks no [place]) Permission granted to share information::  Yes, Verbal Permission Granted  Name::     Talmadge Coventry, granddaughter and Aniceto Boss, daughter.  Agency::     Relationship::     Contact Information:  [phone]   Housing/Transportation Living arrangements for the past 2 months:  Single Family Home Source of Information:  Other (Comment Required), Spouse (Wife, daughter and granddaughter) Patient Interpreter Needed:  Other (Comment Required) (Patient speaks no English, language unknown) Criminal Activity/Legal Involvement Pertinent to Current Situation/Hospitalization:  No - Comment as needed Significant Relationships:  Spouse, Other Family Members (Daughter and granddaughter) Lives with:    Do you feel safe going back to the place where you live?  Yes Need for family participation in patient care:  Yes (Comment)  Care giving concerns:  None expressed by family   Social Worker assessment / plan: CSW talked with Ammie Ferrier [name], granddaughter of patient regarding discharge plans and recommendation of ST rehab. Ms. Windell Hummingbird was pleasant and receptive to speaking with CSW regarding placement for ST rehab. She is in agreement and requested that a SNF list be emailed to her at [email or web address].  Employment status:  Retired Health and safety inspector:  Armed forces operational officer, [employer] [place] PT Recommendations:  Skilled Nursing Facility Information / Referral to community resources:  Other (Comment Required) (None needed or requested at this time)  Patient/Family's Response to care:  No concerns expressed regarding patient's care during this  hospitalization.  Patient/Family's Understanding of and Emotional Response to Diagnosis, Current Treatment, and Prognosis: Not discussed  Emotional Assessment Appearance:  Appears stated age (CSW looked into room, no family present) Attitude/Demeanor/Rapport:  Unable to Assess Affect (typically observed):  Unable to Assess Orientation:  Oriented to Self Alcohol / Substance use:  Never Used Psych involvement (Current and /or in the community):  No (Comment)  Discharge Needs  Concerns to be addressed:  Discharge Planning Concerns Readmission within the last 30 days:  No Current discharge risk:  None Barriers to Discharge:  No Barriers Identified   Cristobal Goldmann, LCSW 03/07/2015, 12:24 PM

## 2015-03-07 NOTE — Progress Notes (Signed)
Physical Therapy Treatment Patient Details Name: Harold Jones MRN: [id number] DOB: [birth year] Today's Date: 03/07/2015    History of Present Illness Harold Jones is a 81 y.o. male with Past meical history of hypertension, diabetes mellitus, atrial fibrillation, [place] VASC 4, coronary artery disease, CVA aortic replacement.Presented to ED with confusion.  Positive for CAP.      PT Comments    Continuing progress with mobility and activity tolerance; Noted pt's HR increased to 137 (observed highest) asymptomatic for lightheadedness; Will benefit form post-acute rehab to maximize independence and safety with mobility; main deficit is with weakness; Overall progressing well; Anticipate continuing good progress at post-acute rehabilitation.   Follow Up Recommendations  SNF     Equipment Recommendations  Rolling walker with 5" wheels    Recommendations for Other Services       Precautions / Restrictions Precautions Precautions: Fall Restrictions Weight Bearing Restrictions: No    Mobility  Bed Mobility Overal bed mobility: Needs Assistance Bed Mobility: Supine to Sit     Supine to sit: Supervision Sit to supine: Min assist   General bed mobility comments: Required extra time.  Transfers Overall transfer level: Needs assistance Equipment used: 1 person hand held assist Transfers: Sit to/from Stand Sit to Stand: Min assist;Mod assist         General transfer comment: Needs steadying assist for stability initially, verbal and physical cues for hand placement; performed serial sit<>stands x5 for strengthening; needing mod assist with more reps due to fatigue  Ambulation/Gait Ambulation/Gait assistance: Min assist Ambulation Distance (Feet): 120 Feet Assistive device: Rolling walker (2 wheeled) Gait Pattern/deviations: Step-through pattern;Trunk flexed     General Gait Details: Frequent cues to stand fully upright and keep RW close   Stairs             Wheelchair Mobility    Modified Rankin (Stroke Patients Only)       Balance     Sitting balance-Leahy Scale: Fair Sitting balance - Comments: Pt can withstand challenges to balance in sitting.       Standing balance-Leahy Scale: Fair                      Cognition Arousal/Alertness: Awake/alert Behavior During Therapy: WFL for tasks assessed/performed Overall Cognitive Status: History of cognitive impairments - at baseline Area of Impairment: Orientation;Memory (Pt still confused to where he is and why he is here) Orientation Level: Disoriented to;Situation   Memory: Decreased short-term memory Following Commands: Follows one step commands consistently Safety/Judgement: Decreased awareness of safety     General Comments: Pt is still confused and disoriented even after speaking with pacific interpreter    Exercises General Exercises - Upper Extremity Shoulder Flexion: Both;Strengthening;Theraband;10 reps;Seated (Pt required seated rest breaks due bil UE fatigue) Theraband Level (Shoulder Flexion): Other (comment) (Orange level 2) Shoulder Extension: Both;Strengthening;Theraband;10 reps;Seated Theraband Level (Shoulder Abduction): Other (comment) (Orange level 2) Shoulder Horizontal ABduction: Both;Strengthening;Theraband;10 reps;Seated Elbow Flexion: Both;Strengthening;Theraband;10 reps;Seated Theraband Level (Elbow Flexion): Other (comment) (Orange level 2) Elbow Extension: Both;Strengthening;Theraband;10 reps;Seated    General Comments General comments (skin integrity, edema, etc.): Serial sit<>stand for strengthening      Pertinent Vitals/Pain Pain Assessment: No/denies pain Faces Pain Scale: No hurt    Home Living                      Prior Function            PT Goals (current goals can now be found in the  care plan section) Acute Rehab PT Goals Patient Stated Goal: to get better PT Goal Formulation: With patient Time For Goal  Achievement: 03/16/15 Potential to Achieve Goals: Good Progress towards PT goals: Progressing toward goals    Frequency  Min 3X/week    PT Plan Current plan remains appropriate;Discharge plan needs to be updated    Co-evaluation             End of Session Equipment Utilized During Treatment: Gait belt Activity Tolerance: Patient tolerated treatment well Patient left: in chair;with call bell/phone within reach;with chair alarm set;with family/visitor present     Time: 1610-9604 PT Time Calculation (min) (ACUTE ONLY): 27 min  Charges:  $Gait Training: 8-22 mins $Therapeutic Exercise: 8-22 mins                    G Codes:      Olen Pel 03/07/2015, 4:21 PM  [facility] [place], [place]  Acute Rehabilitation Services Pager [phone] Office [phone]

## 2015-03-07 NOTE — Progress Notes (Signed)
Occupational Therapy Treatment Patient Details Name: Harold Jones MRN: [id number] DOB: 1927-11-27 Today's Date: 03/07/2015    History of present illness Harold Jones is a 81 y.o. male with Past meical history of hypertension, diabetes mellitus, atrial fibrillation, [place] VASC 4, coronary artery disease, CVA aortic replacement.Presented to ED with confusion.  Positive for CAP.     OT comments  Upon arrival, pt found lying in bed, therapist contacted Harold Jones (Harold Jones) via phone for interpretation to ensure pt's quality of care and understanding. Pt reported to interpreter still confused to why he is in hospital and does not recall being sick. Therapist oriented pt to reason he was in the hospital. Pt performed supine to sit requiring supervision and extra time to complete bed mobility. Once EOB pt engaged in bilateral UE strengthening exercise using orange level 2 theraband. Pt completed 1 rep of 10 consisting of shoulder flexion/extension, shoulder adduction/abduction and elbow flexion/extension. Pt required intermittent rest breaks for completion of strengthening exercises due to bilateral UE fatigue.  Follow Up Recommendations  SNF;Supervision/Assistance - 24 hour    Equipment Recommendations  3 in 1 bedside comode    Recommendations for Other Services      Precautions / Restrictions Precautions Precautions: Fall Restrictions Weight Bearing Restrictions: No       Mobility Bed Mobility Overal bed mobility: Needs Assistance Bed Mobility: Supine to Sit     Supine to sit: Supervision Sit to supine: Min assist   General bed mobility comments: Required extra time.  Transfers Overall transfer level: Needs assistance Equipment used: None   Sit to Stand: Min assist         General transfer comment: Needs steadying assist for stability initially, verbal and physical cues for hand placement    Balance     Sitting balance-Harold Jones Scale: Fair Sitting balance -  Comments: Pt can withstand challenges to balance in sitting.       Standing balance-Harold Jones Scale: Fair                     ADL                                                Vision                     Perception     Praxis      Cognition   Behavior During Therapy: WFL for tasks assessed/performed Overall Cognitive Status: History of cognitive impairments - at baseline Area of Impairment: Orientation;Memory (Pt still confused to where he is and why he is here) Orientation Level: Disoriented to;Place;Time;Situation   Memory: Decreased short-term memory  Following Commands: Follows one step commands consistently       General Comments: Pt is still confused and disoriented even after speaking with pacific interpreter, multi-modal cues for direction following.    Extremity/Trunk Assessment               Exercises General Exercises - Upper Extremity Shoulder Flexion: Both;Strengthening;Theraband;10 reps;Seated (Pt required seated rest breaks due bil UE fatigue) Theraband Level (Shoulder Flexion): Other (comment) (Orange level 2) Shoulder Extension: Both;Strengthening;Theraband;10 reps;Seated Theraband Level (Shoulder Abduction): Other (comment) (Orange level 2) Shoulder Horizontal ABduction: Both;Strengthening;Theraband;10 reps;Seated Elbow Flexion: Both;Strengthening;Theraband;10 reps;Seated Theraband Level (Elbow Flexion): Other (comment) (Orange level 2) Elbow Extension: Both;Strengthening;Theraband;10 reps;Seated   Shoulder Instructions  General Comments      Pertinent Vitals/ Pain       Faces Pain Scale: No hurt  Home Living                                          Prior Functioning/Environment              Frequency Min 2X/week     Progress Toward Goals  OT Goals(current goals can now be found in the care plan section)    Progressing steadily  Acute Rehab OT Goals Patient Stated  Goal: to get better OT Goal Formulation: With patient Time For Goal Achievement: 03/17/15 Potential to Achieve Goals: Good ADL Goals Pt Will Perform Lower Body Bathing: with supervision;with set-up;sit to/from stand Pt Will Perform Lower Body Dressing: with supervision;with set-up;sit to/from stand Pt Will Transfer to Toilet: with min guard assist;ambulating;bedside commode Pt Will Perform Toileting - Clothing Manipulation and hygiene: with modified independence;sit to/from stand Pt/caregiver will Perform Home Exercise Program: Increased strength;Both right and left upper extremity;With Supervision;With theraband;With written HEP provided  Plan Discharge plan remains appropriate    Co-evaluation                 End of Session Equipment Utilized During Treatment: Other (comment)   Activity Tolerance Patient tolerated treatment well (However, required intermittent rest breaks due bil UE fatigue)   Patient Left in bed;with call bell/phone within reach;with bed alarm set   Nurse Communication   Therapist informed nurse of fluctuating heart rate, however, O2 sats remained at 97% on room air          Time: 1330-1353 OT Time Calculation (min): 23 min  Charges:    Harold Jones, Harold Jones 03/07/2015, 3:35 PM

## 2015-03-07 NOTE — Discharge Summary (Signed)
Physician Discharge Summary  Harold Jones IHK:[id number] DOB: 03-20-1928 DOA: 03/01/2015  PCP: Julieanne Manson, MD  Admit date: 03/01/2015 Discharge date: 03/07/2015  Time spent: 40 minutes  Recommendations for Outpatient Follow-up:  1. Follow up with nursing home MD. 2. Continue Augmentin for 4 more days.  Discharge Diagnoses:  Principal Problem:   CAP (community acquired pneumonia) Active Problems:   Essential hypertension   FIBRILLATION, ATRIAL   Cerebral artery occlusion with cerebral infarction   H/O aortic valve replacement   Encephalopathy acute   Diabetes mellitus, type 2   Discharge Condition: Stable  Diet recommendation: Dysphagia 3 with nectar thick liquids  Filed Weights   03/03/15 2046 03/04/15 2032 03/06/15 2052  Weight: 89.675 kg (197 lb 11.2 oz) 88.905 kg (196 lb) 88.678 kg (195 lb 8 oz)    History of present illness:  Harold Jones is Jones 81 y.o. male with Past medical history of hypertension, diabetes mellitus, atrial fibrillation, [place] VASC 8, coronary artery disease, CVA aortic replacement. The patient is presenting with complaints of confusion. History was obtained from the patient with the help of patient's son-in-law who was at bedside helping interpretation. As per the patient he has been having more cough for last 2 weeks with sputum yellow without any blood. He also had some pain on the right side of the chest. He started having feeling weak and lethargic. He was also feeling confused today and therefore he decided to come to the hospital. He had Jones fall 2 months ago and had some pain on his left hip but did not have any limitation to his mobility. He walks without any support. Recently denies any changes in his medication, no sick contacts, no travel anywhere. He mentions he is compliant with all his medications. Initially in the ED ER physician told me that the patient uses 4 L of oxygen at home although patient as well as son-in-law denied any  home oxygen use. He also mentions since last one week he has been having choking episode while eating.  The patient is came from home.  At his baseline ambulates without any support And is independent for most of his ADL manages his medication on his own.   Hospital Course:  Acute respiratory failure Presented with shortness of breath and hypoxia. Patient required 4 L in the emergency department to keep his saturation above 90%. This is likely secondary to the community-acquired pneumonia. Improving, currently off of oxygen.   Community acquired versus aspiration pneumonia Presented with right-sided chest pain, SOB and productive cough of yellow sputum Questionable aspiration, patient reported choking episodes at home. Started on Zosyn and vancomycin, discontinue vancomycin. Continue Zosyn. Discharged on Augmentin for 4 more days to complete 10 days of antibiotics  Dysphagia SLP saw the patient and recommended nothing by mouth for now as he was not cooperating. Per SLP this is partly because of language barrier and confusion. Dysphagia 3 with nectar thick liquids, SLP to follow in the SNF.  Sepsis Patient presented to the hospital with temperature 102.5, RR of 22 and presence of pneumonia. Patient treated with broad-spectrum antibiotics Zosyn and vancomycin. Treated with hydration with IV fluids, this resolved.  Atrial fibrillation, chronic anticoagulation. Since the patient is therapeutic anticoagulation with Xarelto. This patients CHA2DS2-VASc Score and unadjusted Ischemic Stroke Rate (% per year) is equal to 7.2 % stroke rate/year from Jones score of 5 for HTN, DM, CAD and 2 points for previous CVA. Above score calculated as 1 point each if present [CHF, HTN, DM, Vascular=MI/PAD/Aortic  Plaque, Age if 75-74, or Male] Above score calculated as 2 points each if present [Age > 75, or Stroke/TIA/TE]  Diabetes mellitus type 2. Holding oral hypoglycemic agents and placing the patient  on sliding scale insulin. Home medication restarted on discharge.  Essential hypertension. Continue home medication.  Confusion Likely secondary to the sepsis, hypoxia and acute illness. at the present evaluation the patient appears well oriented. Patient did have Jones fall earlier about Jones week prior to admission, but Jones CT scan of the head is unremarkable for any active bleeding. This is resolved, back to his baseline, per family does not have Jones frequent falls, continue Xarelto.  Positive blood cultures One out of 2 blood cultures showed gram-positive cocci in clusters. Coagulase-negative staphwhich is likely contaminant, vancomycin discontinued.   Procedures:  None  Consultations:  None  Discharge Exam: Filed Vitals:   03/07/15 0903  BP: 155/75  Pulse: 99  Temp: 98.5 F (36.9 C)  Resp: 18   General: Alert and awake, oriented x3, not in any acute distress. HEENT: anicteric sclera, pupils reactive to light and accommodation, EOMI CVS: S1-S2 clear, no murmur rubs or gallops Chest: clear to auscultation bilaterally, no wheezing, rales or rhonchi Abdomen: soft nontender, nondistended, normal bowel sounds, no organomegaly Extremities: no cyanosis, clubbing or edema noted bilaterally Neuro: Cranial nerves II-XII intact, no focal neurological deficits  Discharge Instructions   Discharge Instructions    Diet - low sodium heart healthy    Complete by:  As directed      Increase activity slowly    Complete by:  As directed           Current Discharge Medication List    START taking these medications   Details  amoxicillin-clavulanate (AUGMENTIN) 875-125 MG per tablet Take 1 tablet by mouth every 12 (twelve) hours. Qty: 8 tablet, Refills: 0      CONTINUE these medications which have NOT CHANGED   Details  enalapril (VASOTEC) 20 MG tablet Take 20 mg by mouth at bedtime.     furosemide (LASIX) 20 MG tablet Take 20 mg by mouth daily.    gabapentin (NEURONTIN) 300 MG  capsule Take 300 mg by mouth 2 (two) times daily.    glipiZIDE (GLUCOTROL) 5 MG tablet Take 2.5 mg by mouth daily before breakfast.    pravastatin (PRAVACHOL) 40 MG tablet Take 40 mg by mouth daily.    Rivaroxaban (XARELTO) 15 MG TABS tablet Take 15 mg by mouth daily.    tamsulosin (FLOMAX) 0.4 MG CAPS capsule Take 0.4 mg by mouth daily after supper. 30 minutes after the same meal every day. Refills: 3       No Known Allergies Follow-up Information    Follow up with [facility], MD In 1 week.   Specialty:  Internal Medicine   Contact information:   [address] [place] [address] [phone]        The results of significant diagnostics from this hospitalization (including imaging, microbiology, ancillary and laboratory) are listed below for reference.    Significant Diagnostic Studies: Dg Chest 2 View  03/01/2015   CLINICAL DATA:  Patient with chest pain for multiple days.  EXAM: CHEST  2 VIEW  COMPARISON:  Chest radiograph 11/12/2013  FINDINGS: Monitoring leads overlie the patient. Stable enlarged cardiac and mediastinal contours status post median sternotomy. Pulmonary vascular redistribution. Interval development of focal consolidation within the right mid lung. Bibasilar atelectasis and or scarring. Regional skeleton is unremarkable.  IMPRESSION: New consolidative opacity within  the right mid lung concerning for pneumonia in the appropriate clinical setting. Followup PA and lateral chest X-ray is recommended in 3-4 weeks following trial of antibiotic therapy to ensure resolution and exclude underlying malignancy.   Electronically Signed   By: Annia Belt M.D.   On: 03/01/2015 19:14   Ct Head Wo Contrast  03/01/2015   CLINICAL DATA:  Altered mental status for 2 days  EXAM: CT HEAD WITHOUT CONTRAST  TECHNIQUE: Contiguous axial images were obtained from the base of the skull through the vertex without intravenous contrast.  COMPARISON:  Brain MRI 11/13/2013, head CT  11/12/2013  FINDINGS: Left cerebellar encephalomalacia reidentified. Mild cortical volume loss noted with proportional ventricular prominence. Areas of periventricular white matter hypodensity are most compatible with small vessel ischemic change. No acute hemorrhage, infarct, or mass lesion is identified. The patient is angled on the gantry. No midline shift. Stable bone deformity is noted, with interval healing since seen on prior exam 11/12/2013. No skull fracture.  IMPRESSION: No acute intracranial finding.   Electronically Signed   By: Christiana Pellant M.D.   On: 03/01/2015 19:16   Dg Swallowing Func-speech Pathology  03/03/2015    Objective Swallowing Evaluation:    Patient Details  Name: Harold Jones MRN: [id number] Date of Birth: [birth year]  Today's Date: 03/03/2015 Time: SLP Start Time (ACUTE ONLY): 1200-SLP Stop Time (ACUTE ONLY): 1225 SLP Time Calculation (min) (ACUTE ONLY): 25 min  Past Medical History:  Past Medical History  Diagnosis Date  . Hypertension   . Diabetes mellitus   . FIBRILLATION, ATRIAL 08/31/2002    Qualifier: Diagnosis of  By: Barbaraann Barthel MD, [place]    . CVA 02/28/2004    Annotation: embolic Qualifier: Diagnosis of  By: Barbaraann Barthel MD, [place]    . COGNITIVE DEFICITS DUE CEREBROVASCULAR DISEASE 09/09/2009    Qualifier: Diagnosis of  By: Delrae Alfred MD, Lanora Manis    . CORONARY ARTERY DISEASE 02/27/2007    Qualifier: Diagnosis of  By: Barbaraann Barthel MD, [place]    . DYSLIPIDEMIA 02/27/2007    Qualifier: Diagnosis of  By: Barbaraann Barthel MD, [place]    . Bradycardia   . Aortic valve disorder    Past Surgical History:  Past Surgical History  Procedure Laterality Date  . Cardiac surgery     HPI:  Other Pertinent Information: Harold Jones is Jones 81 y.o. male with Past  medical history of hypertension, diabetes mellitus, atrial fibrillation,  [place] VASC 8, coronary artery disease, CVA aortic replacement. Admitted  with confusion, SOB, cough for two weeks with sputum. CXR shows New  consolidative opacity within the  right mid lung concerning for  No Data Recorded  Assessment / Plan / Recommendation CHL IP CLINICAL IMPRESSIONS 03/03/2015  Therapy Diagnosis Moderate oral phase dysphagia;Moderate pharyngeal phase  dysphagia  Clinical Impression Pt demonstrates Jones moderate oral and oropharyngeal   dysphagia with decreased bolus cohesion and premature spillage of liquids  to the pharynx. Aspiration/penetration occurs before and after the  swallow, when trace residuals spill to the vestibule. The pt typically  responds with Jones throat clear, though this does not mobilize tracheal  aspirate. Chin tuck ineffective. Recommend nectar thick liquids as these  were tolerated well as long as pt did not take overlarge sips and will  rduce pts risk of aspiration pna.  Will follow for tolerance and to  reinforce precautions with pt and family.       CHL IP TREATMENT RECOMMENDATION 03/03/2015  Treatment Recommendations Therapy as outlined in treatment plan below  CHL IP DIET RECOMMENDATION 03/03/2015  SLP Diet Recommendations Dysphagia 3 (Mech soft);Nectar  Liquid Administration via (None)  Medication Administration Whole meds with puree  Compensations Slow rate;Small sips/bites  Postural Changes and/or Swallow Maneuvers (None)     CHL IP OTHER RECOMMENDATIONS 03/03/2015  Recommended Consults (None)  Oral Care Recommendations Oral care BID  Other Recommendations Order thickener from pharmacy     No flowsheet data found.   CHL IP FREQUENCY AND DURATION 03/03/2015  Speech Therapy Frequency (ACUTE ONLY) min 2x/week  Treatment Duration 2 weeks     Pertinent Vitals/Pain NA    SLP Swallow Goals  No flowsheet data found.  No flowsheet data found.         Jones, Harold Nearing 03/03/2015, 2:31 PM    Dg Hips Bilat With Pelvis 3-4 Views  03/01/2015   CLINICAL DATA:  Question of fall. Bilateral leg pain. Confusion. Initial encounter.  EXAM: DG HIP (WITH OR WITHOUT PELVIS) 3-4V BILAT  COMPARISON:  None.  FINDINGS: There is no evidence of fracture or dislocation.  Both femoral heads are seated normally within their respective acetabula. The proximal femurs appear intact. Mild degenerative change is noted at the lower lumbar spine. The sacroiliac joints are grossly unremarkable  The visualized bowel gas pattern is grossly unremarkable in appearance. Clips are seen overlying the left inguinal region.  IMPRESSION: No evidence of fracture or dislocation.   Electronically Signed   By: Roanna Raider M.D.   On: 03/01/2015 23:56    Microbiology: Recent Results (from the past 240 hour(s))  Blood culture (routine x 2)     Status: None   Collection Time: 03/01/15  7:37 PM  Result Value Ref Range Status   Specimen Description BLOOD RIGHT ANTECUBITAL  Final   Special Requests BOTTLES DRAWN AEROBIC AND ANAEROBIC 10CC  Final   Culture NO GROWTH 5 DAYS  Final   Report Status 03/06/2015 FINAL  Final  Urine culture     Status: None   Collection Time: 03/01/15  7:39 PM  Result Value Ref Range Status   Specimen Description URINE, CATHETERIZED  Final   Special Requests NONE  Final   Culture NO GROWTH 1 DAY  Final   Report Status 03/02/2015 FINAL  Final  Blood culture (routine x 2)     Status: None   Collection Time: 03/01/15  7:43 PM  Result Value Ref Range Status   Specimen Description BLOOD RIGHT HAND  Final   Special Requests BOTTLES DRAWN AEROBIC AND ANAEROBIC 10CC  Final   Culture  Setup Time   Final    GRAM POSITIVE COCCI IN CLUSTERS AEROBIC BOTTLE ONLY CRITICAL RESULT CALLED TO, READ BACK BY AND VERIFIED WITH: Bridget Hartshorn RN 2026 03/02/15 Jones BROWNING    Culture   Final    STAPHYLOCOCCUS SPECIES (COAGULASE NEGATIVE) THE SIGNIFICANCE OF ISOLATING THIS ORGANISM FROM Jones SINGLE SET OF BLOOD CULTURES WHEN MULTIPLE SETS ARE DRAWN IS UNCERTAIN. PLEASE NOTIFY THE MICROBIOLOGY DEPARTMENT WITHIN ONE WEEK IF SPECIATION AND SENSITIVITIES ARE REQUIRED.    Report Status 03/04/2015 FINAL  Final  MRSA PCR Screening     Status: None   Collection Time: 03/02/15  1:53 AM  Result  Value Ref Range Status   MRSA by PCR NEGATIVE NEGATIVE Final    Comment:        The GeneXpert MRSA Assay (FDA approved for NASAL specimens only), is one component of Jones comprehensive MRSA colonization surveillance program. It is not intended to diagnose MRSA infection nor to  guide or monitor treatment for MRSA infections.   Culture, sputum-assessment     Status: None   Collection Time: 03/02/15  7:14 AM  Result Value Ref Range Status   Specimen Description EXPECTORATED SPUTUM  Final   Special Requests NONE  Final   Sputum evaluation   Final    THIS SPECIMEN IS ACCEPTABLE. RESPIRATORY CULTURE REPORT TO FOLLOW.   Report Status 03/02/2015 FINAL  Final  Gram stain     Status: None   Collection Time: 03/02/15  7:14 AM  Result Value Ref Range Status   Specimen Description SPUTUM  Final   Special Requests NONE  Final   Gram Stain   Final    ABUNDANT WBC PRESENT, PREDOMINANTLY PMN FEW GRAM POSITIVE COCCI IN PAIRS RARE GRAM POSITIVE RODS RARE GRAM NEGATIVE RODS    Report Status 03/02/2015 FINAL  Final  Culture, respiratory (NON-Expectorated)     Status: None   Collection Time: 03/02/15  7:14 AM  Result Value Ref Range Status   Specimen Description SPUTUM  Final   Special Requests NONE  Final   Gram Stain   Final    ABUNDANT WBC PRESENT,BOTH PMN AND MONONUCLEAR RARE SQUAMOUS EPITHELIAL CELLS PRESENT FEW GRAM POSITIVE COCCI IN PAIRS RARE GRAM POSITIVE RODS RARE GRAM NEGATIVE RODS    Culture   Final    NORMAL OROPHARYNGEAL FLORA Performed at [facility]    Report Status 03/05/2015 FINAL  Final     Labs: Basic Metabolic Panel:  Recent Labs Lab 03/01/15 1937 03/02/15 0730 03/04/15 0500 03/06/15 0542 03/07/15 0830  NA 134* 136 138 141 142  K 3.8 4.1 3.6 3.3* 4.3  CL 104 101 108 110 110  CO2 23 22 22 23 24   GLUCOSE 164* 162* 256* 190* 259*  BUN 17 18 25* 20 21*  CREATININE 1.36* 1.26* 1.28* 1.13 1.17  CALCIUM 8.3* 8.4* 8.6* 9.0 9.5  MG  --  1.4*  --  1.8   --    Liver Function Tests:  Recent Labs Lab 03/01/15 1937 03/02/15 0730  AST 15 22  ALT 9* 12*  ALKPHOS 25* 29*  BILITOT 1.5* 1.6*  PROT 6.2* 7.1  ALBUMIN 3.2* 3.5   No results for input(s): LIPASE, AMYLASE in the last 168 hours. No results for input(s): AMMONIA in the last 168 hours. CBC:  Recent Labs Lab 03/01/15 1937 03/02/15 0730 03/04/15 0500 03/06/15 0542  WBC 7.7 8.1 6.2 5.3  NEUTROABS 6.0 6.3  --   --   HGB 12.3* 14.1 13.3 12.6*  HCT 35.1* 40.6 38.1* 37.1*  MCV 88.9 88.8 88.0 89.0  PLT 121* 108* 138* 166   Cardiac Enzymes: No results for input(s): CKTOTAL, CKMB, CKMBINDEX, TROPONINI in the last 168 hours. BNP: BNP (last 3 results) No results for input(s): BNP in the last 8760 hours.  ProBNP (last 3 results) No results for input(s): PROBNP in the last 8760 hours.  CBG:  Recent Labs Lab 03/06/15 1141 03/06/15 1807 03/06/15 2356 03/07/15 0550 03/07/15 1144  GLUCAP 209* 220* 318* 166* 251*       Signed:  Horald Birky Jones  [facility] 03/07/2015, 3:56 PM

## 2015-03-07 NOTE — Care Management Important Message (Signed)
Important Message  Patient Details  Name: Harold Jones MRN: [id number] Date of Birth: 09-Nov-1927   Medicare Important Message Given:  Yes-third notification given    Bernadette Hoit 03/07/2015, 9:17 AM

## 2015-03-07 NOTE — Clinical Social Work Placement (Addendum)
   CLINICAL SOCIAL WORK PLACEMENT  NOTE  Date:  03/07/2015  Patient Details  Name: Harold Jones MRN: [id number] Date of Birth: 02/06/1928  Clinical Social Work is seeking post-discharge placement for this patient at the Skilled  Nursing Facility level of care (*CSW will initial, date and re-position this form in  chart as items are completed):  Yes   Patient/family provided with [facility] Clinical Social Work Department's list of facilities offering this level of care within the geographic area requested by the patient (or if unable, by the patient's family).  Yes   Patient/family informed of their freedom to choose among providers that offer the needed level of care, that participate in Medicare, Medicaid or managed care program needed by the patient, have an available bed and are willing to accept the patient.  Yes   Patient/family informed of [facility]'s ownership interest in [facility] and [facility], as well as of the fact that they are under no obligation to receive care at these facilities.  PASRR submitted to EDS on 03/07/15     PASRR number received on 03/07/15     Existing PASRR number confirmed on       FL2 transmitted to all facilities in geographic area requested by pt/family on 03/07/15     FL2 transmitted to all facilities within larger geographic area on       Patient informed that his/her managed care company has contracts with or will negotiate with certain facilities, including the following:         03/07/15 - Patient/family informed of bed offers received.  Patient chooses bed at  [facility]     Physician recommends and patient chooses bed at      Patient to be transferred to  [facility] on  03/07/15.  Patient to be transferred to facility by  ambulance     Patient family notified on  03/07/15 of transfer.  Name of family member notified:   Granddaughters [name] and [employer]. Talked to [name] to do initial assessment and  talked with [name] at the bedside and got facility decision.  PHYSICIAN       Additional Comment:    _______________________________________________ Cristobal Goldmann, LCSW 03/07/2015, 12:27 PM

## 2015-03-07 NOTE — Progress Notes (Signed)
Speech Language Pathology Treatment: Dysphagia  Patient Details Name: Harold Jones MRN: [id number] DOB: 01-Mar-1928 Today's Date: 03/07/2015 Time: 4010-2725 SLP Time Calculation (min) (ACUTE ONLY): 13 min  Assessment / Plan / Recommendation Clinical Impression  Treatment focused on dysphagia intervention, no family present. Pt with cup thin water at bedside, discussed/notified RN. He continues to require moderate cues for small sips and needs reminders during meals. Vocal quality wet following nectar thick liquids. Not ready for repeat MBS. Continue Dys 3, nectar and continue all recommendations at SNF. Pt will need repeat MBS and can return to hospital from SNF. If MBS is appropriate prior to leaving, will recommend however, likely be as an outpatient. Pt requesting water. Consider [place] water protocol at [facility].   HPI Other Pertinent Information: Harold Jones is a 81 y.o. male with Past medical history of hypertension, diabetes mellitus, atrial fibrillation, [place] VASC 8, coronary artery disease, CVA aortic replacement. Admitted with confusion, SOB, cough for two weeks with sputum. CXR shows New consolidative opacity within the right mid lung concerning for   Pertinent Vitals Pain Assessment: No/denies pain  SLP Plan  Continue with current plan of care    Recommendations Diet recommendations: Dysphagia 3 (mechanical soft);Nectar-thick liquid Liquids provided via: Cup;No straw Medication Administration: Whole meds with puree Supervision: Patient able to self feed;Full supervision/cueing for compensatory strategies Compensations: Slow rate;Small sips/bites Postural Changes and/or Swallow Maneuvers: Seated upright 90 degrees              Oral Care Recommendations: Oral care BID Follow up Recommendations: 24 hour supervision/assistance;Skilled Nursing facility Plan: Continue with current plan of care    GO     Royce Macadamia 03/07/2015, 10:00 AM   Breck Coons Lonell Face.Ed  [employer] [phone]

## 2015-03-08 ENCOUNTER — Encounter: Payer: Self-pay | Admitting: Adult Health

## 2015-03-08 ENCOUNTER — Non-Acute Institutional Stay (SKILLED_NURSING_FACILITY): Payer: Medicare Other | Admitting: Adult Health

## 2015-03-08 DIAGNOSIS — N400 Enlarged prostate without lower urinary tract symptoms: Secondary | ICD-10-CM | POA: Diagnosis not present

## 2015-03-08 DIAGNOSIS — E1140 Type 2 diabetes mellitus with diabetic neuropathy, unspecified: Secondary | ICD-10-CM | POA: Diagnosis not present

## 2015-03-08 DIAGNOSIS — B0229 Other postherpetic nervous system involvement: Secondary | ICD-10-CM | POA: Diagnosis not present

## 2015-03-08 DIAGNOSIS — I482 Chronic atrial fibrillation, unspecified: Secondary | ICD-10-CM

## 2015-03-08 DIAGNOSIS — I639 Cerebral infarction, unspecified: Secondary | ICD-10-CM | POA: Diagnosis not present

## 2015-03-08 DIAGNOSIS — I119 Hypertensive heart disease without heart failure: Secondary | ICD-10-CM

## 2015-03-08 DIAGNOSIS — J189 Pneumonia, unspecified organism: Secondary | ICD-10-CM | POA: Diagnosis not present

## 2015-03-08 DIAGNOSIS — E1149 Type 2 diabetes mellitus with other diabetic neurological complication: Secondary | ICD-10-CM | POA: Insufficient documentation

## 2015-03-08 DIAGNOSIS — E785 Hyperlipidemia, unspecified: Secondary | ICD-10-CM

## 2015-03-08 DIAGNOSIS — I6350 Cerebral infarction due to unspecified occlusion or stenosis of unspecified cerebral artery: Secondary | ICD-10-CM

## 2015-03-08 NOTE — Progress Notes (Signed)
Patient ID: Harold Jones, male   DOB: [birth year], 81 y.o.   MRN: [id number]    Facility: [facility]      No Known Allergies  Chief Complaint  Patient presents with  . Hospitalization Follow-up    HPI:  He had been hospitalized for community acquired pneumonia. He had a productive cough for 2 weeks with sputum for 2 weeks prior to his hospitalization. He also had a fall one week prior to his hospitalization. He is here for short term rehab with his goal to return back home. He is unable to fully participate in the hpi or ros due to language barrier.     Past Medical History  Diagnosis Date  . Hypertension   . Diabetes mellitus   . FIBRILLATION, ATRIAL 08/31/2002    Qualifier: Diagnosis of  By: Barbaraann Barthel MD, [place]    . CVA 02/28/2004    Annotation: embolic Qualifier: Diagnosis of  By: Barbaraann Barthel MD, [place]    . COGNITIVE DEFICITS DUE CEREBROVASCULAR DISEASE 09/09/2009    Qualifier: Diagnosis of  By: Delrae Alfred MD, Lanora Manis    . CORONARY ARTERY DISEASE 02/27/2007    Qualifier: Diagnosis of  By: Barbaraann Barthel MD, [place]    . DYSLIPIDEMIA 02/27/2007    Qualifier: Diagnosis of  By: Barbaraann Barthel MD, [place]    . Bradycardia   . Aortic valve disorder     Past Surgical History  Procedure Laterality Date  . Cardiac surgery       Social History   Social History  . Marital Status: Married    Spouse Name: N/A  . Number of Children: N/A  . Years of Education: N/A   Occupational History  . Not on file.   Social History Main Topics  . Smoking status: Never Smoker   . Smokeless tobacco: Never Used  . Alcohol Use: No  . Drug Use: No  . Sexual Activity: Not on file   Other Topics Concern  . Not on file   Social History Narrative      VITAL SIGNS BP 144/86 mmHg  Pulse 88  Resp 16  Ht 6' (1.829 m)  Wt 195 lb (88.451 kg)  BMI 26.44 kg/m2  Patient's Medications  New Prescriptions   No medications on file  Previous Medications   AMOXICILLIN-CLAVULANATE  (AUGMENTIN) 875-125 MG PER TABLET    Take 1 tablet by mouth every 12 (twelve) hours.   ENALAPRIL (VASOTEC) 20 MG TABLET    Take 20 mg by mouth at bedtime.    FUROSEMIDE (LASIX) 20 MG TABLET    Take 20 mg by mouth daily.   GABAPENTIN (NEURONTIN) 300 MG CAPSULE    Take 300 mg by mouth 2 (two) times daily.   GLIPIZIDE (GLUCOTROL) 5 MG TABLET    Take 2.5 mg by mouth daily before breakfast.   PRAVASTATIN (PRAVACHOL) 40 MG TABLET    Take 40 mg by mouth daily.   RIVAROXABAN (XARELTO) 15 MG TABS TABLET    Take 15 mg by mouth daily.   TAMSULOSIN (FLOMAX) 0.4 MG CAPS CAPSULE    Take 0.4 mg by mouth daily after supper. 30 minutes after the same meal every day.  Modified Medications   No medications on file  Discontinued Medications   No medications on file     SIGNIFICANT DIAGNOSTIC EXAMS  03-01-15: ct of head: No acute intracranial finding.  03-01-15: chest x-ray: New consolidative opacity within the right mid lung concerning for pneumonia in the appropriate clinical setting.   03-01-15: bilateral hip  and pelvis x-ray: No evidence of fracture or dislocation  03-03-15: swallow study: Dysphagia 3 (Mech soft);Nectar    LABS REVIEWED:   03-01-15; wbc 7.7; hgb 12.;3 hct 35.1; mcv 88.9 ;plt 121; glucose 164; bun 17; creat 1.36; k+3.8; na++134; liver normal albumin 3.2; blood culture: no growth; urine culture: no growth 03-02-15: wbc 8.1; hgb 14.1; hct 40.6; mcv 88.8; plt 108; glucose 162; bun 18; creat 1.26; k+4.1; na++136; liver normal albumin 3.5; mag 1.4 03-06-15: wbc 5.3; hgb 12.6; hct 37.1; mcv 89.0; plt 166; glucose 190; bun 20; creat 1.13; k+3.3;  na++ 141; mag 1.8      Review of Systems  Unable to perform ROS: Other      Physical Exam  Constitutional: No distress.  Eyes: Conjunctivae are normal.  Neck: Neck supple. No JVD present. No thyromegaly present.  Cardiovascular: Normal rate, regular rhythm and intact distal pulses.   Respiratory: Effort normal and breath sounds normal. No  respiratory distress. He has no wheezes.  GI: Soft. Bowel sounds are normal. He exhibits no distension. There is no tenderness.  Musculoskeletal: He exhibits no edema.  Able to move all extremities   Lymphadenopathy:    He has no cervical adenopathy.  Neurological: He is alert.  Skin: Skin is warm and dry. He is not diaphoretic.  Psychiatric: He has a normal mood and affect.       ASSESSMENT/ PLAN:  1. Pneumonia: will have him complete his four days of augment and will monitor his status. His sepsis and acute respiratory have resolved. He is no longer dependent on 02.   2. Hypertension: is stable will continue vasotec 20 mg daily   3. Dyslipidemia: will continue pravachol 40 mg daily   4. BPH: will continue flomax 0.4 mg daily   5. Edema: is stable will continue lasix 20 mg daily   6. Afib: heart rate is stable; is on long term xarelto 15 mg daily   7. Diabetes: will continue glipizide 2.5 mg daily will have nursing check cbg twice daily and will monitor his status.   8. CVA: is neurologically stable; is on xarelto 15 mg daily; will monitor  9. History herpes zoster is taking neurontin 300 mg twice daily for postherpetic pain     Will repeat chest x-ray in 4 weeks; will check bmp  This week     Time spent with patient  50  minutes >50% time spent counseling; reviewing medical record; tests; labs; and developing future plan of care   Synthia Innocent NP [facility]  Contact [phone] Monday through Friday 8am- 5pm  After hours call [phone]

## 2015-03-10 ENCOUNTER — Non-Acute Institutional Stay (SKILLED_NURSING_FACILITY): Payer: Medicare Other | Admitting: Internal Medicine

## 2015-03-10 DIAGNOSIS — I482 Chronic atrial fibrillation, unspecified: Secondary | ICD-10-CM

## 2015-03-10 DIAGNOSIS — E785 Hyperlipidemia, unspecified: Secondary | ICD-10-CM | POA: Diagnosis not present

## 2015-03-10 DIAGNOSIS — Z8673 Personal history of transient ischemic attack (TIA), and cerebral infarction without residual deficits: Secondary | ICD-10-CM | POA: Diagnosis not present

## 2015-03-10 DIAGNOSIS — E1140 Type 2 diabetes mellitus with diabetic neuropathy, unspecified: Secondary | ICD-10-CM | POA: Diagnosis not present

## 2015-03-10 DIAGNOSIS — J189 Pneumonia, unspecified organism: Secondary | ICD-10-CM

## 2015-03-10 DIAGNOSIS — E1149 Type 2 diabetes mellitus with other diabetic neurological complication: Secondary | ICD-10-CM

## 2015-03-10 DIAGNOSIS — I10 Essential (primary) hypertension: Secondary | ICD-10-CM

## 2015-03-10 DIAGNOSIS — Z952 Presence of prosthetic heart valve: Secondary | ICD-10-CM

## 2015-03-10 DIAGNOSIS — N400 Enlarged prostate without lower urinary tract symptoms: Secondary | ICD-10-CM | POA: Diagnosis not present

## 2015-03-10 DIAGNOSIS — Z954 Presence of other heart-valve replacement: Secondary | ICD-10-CM | POA: Diagnosis not present

## 2015-03-15 ENCOUNTER — Encounter: Payer: Self-pay | Admitting: Internal Medicine

## 2015-03-15 DIAGNOSIS — Z8673 Personal history of transient ischemic attack (TIA), and cerebral infarction without residual deficits: Secondary | ICD-10-CM | POA: Insufficient documentation

## 2015-03-15 NOTE — Progress Notes (Signed)
Patient ID: Harold Jones, male   DOB: 09/13/1927, 81 y.o.   MRN: [id number]    HISTORY AND PHYSICAL   DATE: 03/10/15  Location:  [facility]    Place of Service: SNF (31)   Extended Emergency Contact Information Primary Emergency Contact: [name],Ljerka Address: [address]          [place], [place] Home Phone: [phone] Relation: Grandaughter Secondary Emergency Contact: Lieutenant [place] Mobile Phone: [phone] Relation: Daughter  Advanced Directive information  FULL CODE  Chief Complaint  Patient presents with  . New Admit To SNF    HPI:  81 yo male seen today as a new admission into SNF following hospital stay for sepsis due to CAP with acute encephalopathy. He presented to ED with confusion and hypoxia. CXR revealed RML infiltrate. He was seen by speech and had swallowing eval due to dysphagia. Diet modified to dysphagia 3 diet. He had 4 additional days to complete Augmentin. No nursing issues. Appetite is good and he sleeps well. He speaks very little [place] and unable to obtain further HPI. Hx obtained from chart  Afib/hx AVR/HTN - rate controlled. Takes lasix and lisinopril for BP. xeralto for anticoagulation  DM2 - CBGs 210-280s. No  Low BS reactions. Takes low dose glipizide. Gabapentin for neuropathy  Hx CVA - he has dysphagia. Takes xeralto  hyperlipdemia - takes statin. No myalgias  BPH - stable on flomax  Past Medical History  Diagnosis Date  . Hypertension   . Diabetes mellitus   . FIBRILLATION, ATRIAL 08/31/2002    Qualifier: Diagnosis of  By: Barbaraann Barthel MD, [place]    . CVA 02/28/2004    Annotation: embolic Qualifier: Diagnosis of  By: Barbaraann Barthel MD, [place]    . COGNITIVE DEFICITS DUE CEREBROVASCULAR DISEASE 09/09/2009    Qualifier: Diagnosis of  By: Delrae Alfred MD, Lanora Manis    . CORONARY ARTERY DISEASE 02/27/2007    Qualifier: Diagnosis of  By: Barbaraann Barthel MD, [place]    .  DYSLIPIDEMIA 02/27/2007    Qualifier: Diagnosis of  By: Barbaraann Barthel MD, [place]    . Bradycardia   . Aortic valve disorder     Past Surgical History  Procedure Laterality Date  . Cardiac surgery      Patient Care Team: Julieanne Manson, MD as PCP - General  Social History   Social History  . Marital Status: Married    Spouse Name: N/A  . Number of Children: N/A  . Years of Education: N/A   Occupational History  . Not on file.   Social History Main Topics  . Smoking status: Never Smoker   . Smokeless tobacco: Never Used  . Alcohol Use: No  . Drug Use: No  . Sexual Activity: Not on file   Other Topics Concern  . Not on file   Social History Narrative     reports that he has never smoked. He has never used smokeless tobacco. He reports that he does not drink alcohol or use illicit drugs.  No family history on file. No family status information on file.    Immunization History  Administered Date(s) Administered  . Influenza Whole 04/01/2006, 05/18/2008, 07/22/2009  . Pneumococcal Polysaccharide-23 04/01/2005, 10/10/2011  . Td 03/02/1997, 11/16/2008  . Tdap 11/12/2013    No Known Allergies  Medications: Patient's Medications  New Prescriptions   No medications on file  Previous Medications   AMOXICILLIN-CLAVULANATE (AUGMENTIN) 875-125 MG PER TABLET    Take 1 tablet by  mouth every 12 (twelve) hours.   ENALAPRIL (VASOTEC) 20 MG TABLET    Take 20 mg by mouth at bedtime.    FUROSEMIDE (LASIX) 20 MG TABLET    Take 20 mg by mouth daily.   GABAPENTIN (NEURONTIN) 300 MG CAPSULE    Take 300 mg by mouth 2 (two) times daily.   GLIPIZIDE (GLUCOTROL) 5 MG TABLET    Take 2.5 mg by mouth daily before breakfast.   PRAVASTATIN (PRAVACHOL) 40 MG TABLET    Take 40 mg by mouth daily.   RIVAROXABAN (XARELTO) 15 MG TABS TABLET    Take 15 mg by mouth daily.   TAMSULOSIN (FLOMAX) 0.4 MG CAPS CAPSULE    Take 0.4 mg by mouth daily after supper. 30 minutes after the same meal every  day.  Modified Medications   No medications on file  Discontinued Medications   No medications on file    Review of Systems  Unable to perform ROS: Other  language barrier  Filed Vitals:   03/10/15 1028  BP: 138/86  Pulse: 68  Temp: 98 F (36.7 C)  Weight: 190 lb (86.183 kg)  SpO2: 95%   Body mass index is 25.76 kg/(m^2).  Physical Exam  Constitutional: He appears well-developed and well-nourished.  Sitting in bed in NAD  HENT:  Mouth/Throat: Oropharynx is clear and moist.  Eyes: Pupils are equal, round, and reactive to light. No scleral icterus.  Neck: Neck supple. Carotid bruit is not present. No thyromegaly present.  Cardiovascular: Normal rate and intact distal pulses.  An irregularly irregular rhythm present. Exam reveals no gallop and no friction rub.   Murmur (1/6 SEM) heard. no distal LE swelling. No calf TTP  Pulmonary/Chest: Effort normal and breath sounds normal. He has no wheezes. He has no rales. He exhibits no tenderness.  Abdominal: Soft. Bowel sounds are normal. He exhibits no distension, no abdominal bruit, no pulsatile midline mass and no mass. There is no tenderness. There is no rebound and no guarding.  Lymphadenopathy:    He has no cervical adenopathy.  Neurological: He is alert. He has normal reflexes.  Skin: Skin is warm and dry. No rash noted.  Psychiatric: He has a normal mood and affect. His behavior is normal.     Labs reviewed: Admission on 03/01/2015, Discharged on 03/07/2015  No results displayed because visit has over 200 results.     CBC Latest Ref Rng 03/06/2015 03/04/2015 03/02/2015  WBC 4.0 - 10.5 K/uL 5.3 6.2 8.1  Hemoglobin 13.0 - 17.0 g/dL 12.6(L) 13.3 14.1  Hematocrit 39.0 - 52.0 % 37.1(L) 38.1(L) 40.6  Platelets 150 - 400 K/uL 166 138(L) 108(L)    CMP Latest Ref Rng 03/07/2015 03/06/2015 03/04/2015  Glucose 65 - 99 mg/dL 161(W) 960(A) 540(J)  BUN 6 - 20 mg/dL 81(X) 20 91(Y)  Creatinine 0.61 - 1.24 mg/dL 7.82 9.56 2.13(Y)  Sodium  135 - 145 mmol/L 142 141 138  Potassium 3.5 - 5.1 mmol/L 4.3 3.3(L) 3.6  Chloride 101 - 111 mmol/L 110 110 108  CO2 22 - 32 mmol/L Calcium 8.9 - 10.3 mg/dL 9.5 9.0 8.6(V)  Total Protein 6.5 - 8.1 g/dL - - -  Total Bilirubin 0.3 - 1.2 mg/dL - - -  Alkaline Phos 38 - 126 U/L - - -  AST 15 - 41 U/L - - -  ALT 17 - 63 U/L - - -      Dg Chest 2 View  03/01/2015   CLINICAL DATA:  Patient with chest pain for multiple days.  EXAM: CHEST  2 VIEW  COMPARISON:  Chest radiograph 11/12/2013  FINDINGS: Monitoring leads overlie the patient. Stable enlarged cardiac and mediastinal contours status post median sternotomy. Pulmonary vascular redistribution. Interval development of focal consolidation within the right mid lung. Bibasilar atelectasis and or scarring. Regional skeleton is unremarkable.  IMPRESSION: New consolidative opacity within the right mid lung concerning for pneumonia in the appropriate clinical setting. Followup PA and lateral chest X-ray is recommended in 3-4 weeks following trial of antibiotic therapy to ensure resolution and exclude underlying malignancy.   Electronically Signed   By: Annia Belt M.D.   On: 03/01/2015 19:14   Ct Head Wo Contrast  03/01/2015   CLINICAL DATA:  Altered mental status for 2 days  EXAM: CT HEAD WITHOUT CONTRAST  TECHNIQUE: Contiguous axial images were obtained from the base of the skull through the vertex without intravenous contrast.  COMPARISON:  Brain MRI 11/13/2013, head CT 11/12/2013  FINDINGS: Left cerebellar encephalomalacia reidentified. Mild cortical volume loss noted with proportional ventricular prominence. Areas of periventricular white matter hypodensity are most compatible with small vessel ischemic change. No acute hemorrhage, infarct, or mass lesion is identified. The patient is angled on the gantry. No midline shift. Stable bone deformity is noted, with interval healing since seen on prior exam 11/12/2013. No skull fracture.  IMPRESSION:  No acute intracranial finding.   Electronically Signed   By: Christiana Pellant M.D.   On: 03/01/2015 19:16   Dg Swallowing Func-speech Pathology  03/03/2015    Objective Swallowing Evaluation:    Patient Details  Name: Harold Jones MRN: [id number] Date of Birth: July 26, 1927  Today's Date: 03/03/2015 Time: SLP Start Time (ACUTE ONLY): 1200-SLP Stop Time (ACUTE ONLY): 1225 SLP Time Calculation (min) (ACUTE ONLY): 25 min  Past Medical History:  Past Medical History  Diagnosis Date  . Hypertension   . Diabetes mellitus   . FIBRILLATION, ATRIAL 08/31/2002    Qualifier: Diagnosis of  By: Barbaraann Barthel MD, [place]    . CVA 02/28/2004    Annotation: embolic Qualifier: Diagnosis of  By: Barbaraann Barthel MD, [place]    . COGNITIVE DEFICITS DUE CEREBROVASCULAR DISEASE 09/09/2009    Qualifier: Diagnosis of  By: Delrae Alfred MD, Lanora Manis    . CORONARY ARTERY DISEASE 02/27/2007    Qualifier: Diagnosis of  By: Barbaraann Barthel MD, [place]    . DYSLIPIDEMIA 02/27/2007    Qualifier: Diagnosis of  By: Barbaraann Barthel MD, [place]    . Bradycardia   . Aortic valve disorder    Past Surgical History:  Past Surgical History  Procedure Laterality Date  . Cardiac surgery     HPI:  Other Pertinent Information: Kailand Furukawa is a 81 y.o. male with Past  medical history of hypertension, diabetes mellitus, atrial fibrillation,  [place] VASC 8, coronary artery disease, CVA aortic replacement. Admitted  with confusion, SOB, cough for two weeks with sputum. CXR shows New  consolidative opacity within the right mid lung concerning for  No Data Recorded  Assessment / Plan / Recommendation CHL IP CLINICAL IMPRESSIONS 03/03/2015  Therapy Diagnosis Moderate oral phase dysphagia;Moderate pharyngeal phase  dysphagia  Clinical Impression Pt demonstrates a moderate oral and oropharyngeal   dysphagia with decreased bolus cohesion and premature spillage of liquids  to the pharynx. Aspiration/penetration occurs before and after the  swallow, when trace residuals spill to the vestibule. The pt  typically  responds with a throat clear, though this does not mobilize tracheal  aspirate. Chin tuck ineffective. Recommend  nectar thick liquids as these  were tolerated well as long as pt did not take overlarge sips and will  rduce pts risk of aspiration pna.  Will follow for tolerance and to  reinforce precautions with pt and family.       CHL IP TREATMENT RECOMMENDATION 03/03/2015  Treatment Recommendations Therapy as outlined in treatment plan below     CHL IP DIET RECOMMENDATION 03/03/2015  SLP Diet Recommendations Dysphagia 3 (Mech soft);Nectar  Liquid Administration via (None)  Medication Administration Whole meds with puree  Compensations Slow rate;Small sips/bites  Postural Changes and/or Swallow Maneuvers (None)     CHL IP OTHER RECOMMENDATIONS 03/03/2015  Recommended Consults (None)  Oral Care Recommendations Oral care BID  Other Recommendations Order thickener from pharmacy     No flowsheet data found.   CHL IP FREQUENCY AND DURATION 03/03/2015  Speech Therapy Frequency (ACUTE ONLY) min 2x/week  Treatment Duration 2 weeks     Pertinent Vitals/Pain NA    SLP Swallow Goals  No flowsheet data found.  No flowsheet data found.         DeBlois, Riley Nearing 03/03/2015, 2:31 PM    Dg Hips Bilat With Pelvis 3-4 Views  03/01/2015   CLINICAL DATA:  Question of fall. Bilateral leg pain. Confusion. Initial encounter.  EXAM: DG HIP (WITH OR WITHOUT PELVIS) 3-4V BILAT  COMPARISON:  None.  FINDINGS: There is no evidence of fracture or dislocation. Both femoral heads are seated normally within their respective acetabula. The proximal femurs appear intact. Mild degenerative change is noted at the lower lumbar spine. The sacroiliac joints are grossly unremarkable  The visualized bowel gas pattern is grossly unremarkable in appearance. Clips are seen overlying the left inguinal region.  IMPRESSION: No evidence of fracture or dislocation.   Electronically Signed   By: Roanna Raider M.D.   On: 03/01/2015 23:56      Assessment/Plan   ICD-9-CM ICD-10-CM   1. Type II diabetes mellitus with neurological manifestations - with frequent hyperglycemia 250.60 E11.40   2. CAP (community acquired pneumonia) - improving clinically 486 J18.9   3. Chronic atrial fibrillation - rate controlled 427.31 I48.2   4. Essential hypertension - stable 401.9 I10   5. H/O aortic valve replacement - stable V43.3 Z95.4   6. History of stroke - stable V12.54 Z86.73   7. Hyperlipidemia LDL goal <100 - stable 272.4 E78.5   8. BPH without obstruction/lower urinary tract symptoms - stable 600.00 N40.0     --increase glipizide 5mg  po daily  --cont other meds as ordered  --finish abx  --repeat CXR in 4 weeks  --PT/OT/ST as ordered  --GOAL: short term rehab and d/c home when medically appropriate. Communicated with pt and nursing.  --will follow  Dalicia Kisner S. Ancil Linsey  [facility] [address] [place], [place] [address] [phone] Cell (Monday-Friday 8 AM - 5 PM) [phone] After 5 PM and follow prompts

## 2015-04-06 ENCOUNTER — Non-Acute Institutional Stay (SKILLED_NURSING_FACILITY): Payer: Medicare Other | Admitting: Adult Health

## 2015-04-06 DIAGNOSIS — E785 Hyperlipidemia, unspecified: Secondary | ICD-10-CM

## 2015-04-06 DIAGNOSIS — I482 Chronic atrial fibrillation, unspecified: Secondary | ICD-10-CM

## 2015-04-06 DIAGNOSIS — I119 Hypertensive heart disease without heart failure: Secondary | ICD-10-CM

## 2015-04-06 DIAGNOSIS — B0229 Other postherpetic nervous system involvement: Secondary | ICD-10-CM | POA: Diagnosis not present

## 2015-04-06 DIAGNOSIS — E1149 Type 2 diabetes mellitus with other diabetic neurological complication: Secondary | ICD-10-CM | POA: Diagnosis not present

## 2015-04-06 DIAGNOSIS — N400 Enlarged prostate without lower urinary tract symptoms: Secondary | ICD-10-CM

## 2015-04-28 ENCOUNTER — Encounter: Payer: Self-pay | Admitting: Adult Health

## 2015-04-28 NOTE — Progress Notes (Signed)
Patient ID: Harold Jones, male   DOB: 03/16/1928, 81 y.o.   MRN: [id number]    Facility[facility]      No Known Allergies  Chief Complaint  Patient presents with  . Medical Management of Chronic Issues    HPI:  He is a resident of this facility being seen for the management of his chronic illnesses. Overall his status is stable. He has completed his abt for his pneumonia. He continues to require nectar thick liquids. He is unable to fully participate in the hpi or ros. There are no nursing concerns at this time.    Past Medical History  Diagnosis Date  . Hypertension   . Diabetes mellitus   . FIBRILLATION, ATRIAL 08/31/2002    Qualifier: Diagnosis of  By: Barbaraann Barthelankins MD, [place]    . CVA 02/28/2004    Annotation: embolic Qualifier: Diagnosis of  By: Barbaraann Barthelankins MD, [place]    . COGNITIVE DEFICITS DUE CEREBROVASCULAR DISEASE 09/09/2009    Qualifier: Diagnosis of  By: Delrae AlfredMulberry MD, Lanora ManisElizabeth    . CORONARY ARTERY DISEASE 02/27/2007    Qualifier: Diagnosis of  By: Barbaraann Barthelankins MD, [place]    . DYSLIPIDEMIA 02/27/2007    Qualifier: Diagnosis of  By: Barbaraann Barthelankins MD, [place]    . Bradycardia   . Aortic valve disorder     Past Surgical History  Procedure Laterality Date  . Cardiac surgery      VITAL SIGNS BP 138/86 mmHg  Pulse 68  Ht 6' (1.829 m)  Wt 194 lb (87.998 kg)  BMI 26.31 kg/m2  SpO2 95%  Patient's Medications  New Prescriptions   No medications on file  Previous Medications   ENALAPRIL (VASOTEC) 20 MG TABLET    Take 20 mg by mouth at bedtime.    FUROSEMIDE (LASIX) 20 MG TABLET    Take 20 mg by mouth daily.   GABAPENTIN (NEURONTIN) 300 MG CAPSULE    Take 300 mg by mouth 2 (two) times daily.   GLIPIZIDE (GLUCOTROL) 5 MG TABLET    Take 2.5 mg by mouth daily before breakfast.   PRAVASTATIN (PRAVACHOL) 40 MG TABLET    Take 40 mg by mouth daily.   RIVAROXABAN (XARELTO) 15 MG TABS TABLET    Take 15 mg by mouth daily.   TAMSULOSIN (FLOMAX) 0.4 MG CAPS CAPSULE     Take 0.4 mg by mouth daily after supper. 30 minutes after the same meal every day.  Modified Medications   No medications on file  Discontinued Medications     SIGNIFICANT DIAGNOSTIC EXAMS   03-01-15: ct of head: No acute intracranial finding.  03-01-15: chest x-ray: New consolidative opacity within the right mid lung concerning for pneumonia in the appropriate clinical setting.   03-01-15: bilateral hip and pelvis x-ray: No evidence of fracture or dislocation  03-03-15: swallow study: Dysphagia 3 (Mech soft);Nectar   03-29-15: FEES: nectar thick    LABS REVIEWED:   03-01-15; wbc 7.7; hgb 12.;3 hct 35.1; mcv 88.9 ;plt 121; glucose 164; bun 17; creat 1.36; k+3.8; na++134; liver normal albumin 3.2; blood culture: no growth; urine culture: no growth 03-02-15: wbc 8.1; hgb 14.1; hct 40.6; mcv 88.8; plt 108; glucose 162; bun 18; creat 1.26; k+4.1; na++136; liver normal albumin 3.5; mag 1.4 03-06-15: wbc 5.3; hgb 12.6; hct 37.1; mcv 89.0; plt 166; glucose 190; bun 20; creat 1.13; k+3.3;  na++ 141; mag 1.8  03-09-15: glucose 212; bun 23.7; hct 1.20; k+ 4.8; na++143      Review of Systems  Unable to perform ROS: Other   Physical Exam Constitutional: No distress.  Eyes: Conjunctivae are normal.  Neck: Neck supple. No JVD present. No thyromegaly present.  Cardiovascular: Normal rate, regular rhythm and intact distal pulses.   Respiratory: Effort normal and breath sounds normal. No respiratory distress. He has no wheezes.  GI: Soft. Bowel sounds are normal. He exhibits no distension. There is no tenderness.  Musculoskeletal: He exhibits no edema.  Able to move all extremities   Lymphadenopathy:    He has no cervical adenopathy.  Neurological: He is alert.  Skin: Skin is warm and dry. He is not diaphoretic.  Psychiatric: He has a normal mood and affect    ASSESSMENT/ PLAN:  1. Hypertension: is stable will continue vasotec 20 mg daily   2. Dyslipidemia: will continue pravachol 40 mg  daily   3. BPH: will continue flomax 0.4 mg daily   4. Edema: is stable will continue lasix 20 mg daily   5. Afib: heart rate is stable; is on long term xarelto 15 mg daily   6. Diabetes: will continue glipizide 2.5 mg daily will have nursing check cbg twice daily and will monitor his status.   7. CVA: is neurologically stable; is on xarelto 15 mg daily; will monitor  8. History herpes zoster is taking neurontin 300 mg twice daily for postherpetic pain   9. Dysphagia: had FEES study on 03-29-15; is on nectar thick liquids; no signs of aspiration present will not make changes.     Synthia Innocent NP [facility]  Contact [phone] Monday through Friday 8am- 5pm  After hours call [phone]

## 2015-05-04 ENCOUNTER — Non-Acute Institutional Stay (SKILLED_NURSING_FACILITY): Payer: Medicare Other | Admitting: Adult Health

## 2015-05-04 ENCOUNTER — Encounter: Payer: Self-pay | Admitting: Adult Health

## 2015-05-04 DIAGNOSIS — E1149 Type 2 diabetes mellitus with other diabetic neurological complication: Secondary | ICD-10-CM

## 2015-05-04 DIAGNOSIS — I482 Chronic atrial fibrillation, unspecified: Secondary | ICD-10-CM

## 2015-05-04 DIAGNOSIS — I119 Hypertensive heart disease without heart failure: Secondary | ICD-10-CM | POA: Diagnosis not present

## 2015-05-04 DIAGNOSIS — B0229 Other postherpetic nervous system involvement: Secondary | ICD-10-CM | POA: Diagnosis not present

## 2015-05-04 DIAGNOSIS — I10 Essential (primary) hypertension: Secondary | ICD-10-CM

## 2015-05-04 DIAGNOSIS — E785 Hyperlipidemia, unspecified: Secondary | ICD-10-CM | POA: Diagnosis not present

## 2015-05-04 DIAGNOSIS — N400 Enlarged prostate without lower urinary tract symptoms: Secondary | ICD-10-CM

## 2015-05-04 NOTE — Progress Notes (Signed)
Patient ID: Harold Jones, male   DOB: February 25, 1928, 81 y.o.   MRN: [id number]   Facility: [facility]      No Known Allergies  Chief Complaint  Patient presents with  . Medical Management of Chronic Issues    HPI:  He is a long term resident of this facility being seen for the management of his chronic illnesses.  Overall his status is stable. He is unable to fully participate in the hpi or ros. There are no nursing concerns at this time.    Past Medical History  Diagnosis Date  . Hypertension   . Diabetes mellitus   . FIBRILLATION, ATRIAL 08/31/2002    Qualifier: Diagnosis of  By: Barbaraann Barthel MD, [place]    . CVA 02/28/2004    Annotation: embolic Qualifier: Diagnosis of  By: Barbaraann Barthel MD, [place]    . COGNITIVE DEFICITS DUE CEREBROVASCULAR DISEASE 09/09/2009    Qualifier: Diagnosis of  By: Delrae Alfred MD, Lanora Manis    . CORONARY ARTERY DISEASE 02/27/2007    Qualifier: Diagnosis of  By: Barbaraann Barthel MD, [place]    . DYSLIPIDEMIA 02/27/2007    Qualifier: Diagnosis of  By: Barbaraann Barthel MD, [place]    . Bradycardia   . Aortic valve disorder     Past Surgical History  Procedure Laterality Date  . Cardiac surgery      VITAL SIGNS BP 160/57 mmHg  Pulse 76  Ht  (1.88 m)  Wt 196 lb (88.905 kg)  BMI 25.15 kg/m2  SpO2 98%  Patient's Medications  New Prescriptions   No medications on file  Previous Medications   ENALAPRIL (VASOTEC) 20 MG TABLET    Take 20 mg by mouth at bedtime.    FUROSEMIDE (LASIX) 20 MG TABLET    Take 20 mg by mouth daily.   GABAPENTIN (NEURONTIN) 300 MG CAPSULE    Take 300 mg by mouth 2 (two) times daily.   GLIPIZIDE (GLUCOTROL) 5 MG TABLET    Take 5 mg by mouth daily before breakfast.    PRAVASTATIN (PRAVACHOL) 40 MG TABLET    Take 40 mg by mouth daily.   RIVAROXABAN (XARELTO) 15 MG TABS TABLET    Take 15 mg by mouth daily.   TAMSULOSIN (FLOMAX) 0.4 MG CAPS CAPSULE    Take 0.4 mg by mouth daily after supper. 30 minutes after the same meal every day.   Modified Medications   No medications on file  Discontinued Medications   No medications on file     SIGNIFICANT DIAGNOSTIC EXAMS  03-01-15: ct of head: No acute intracranial finding.  03-01-15: chest x-ray: New consolidative opacity within the right mid lung concerning for pneumonia in the appropriate clinical setting.   03-01-15: bilateral hip and pelvis x-ray: No evidence of fracture or dislocation  03-03-15: swallow study: Dysphagia 3 (Mech soft);Nectar   03-29-15: FEES: nectar thick    LABS REVIEWED:   03-01-15; wbc 7.7; hgb 12.;3 hct 35.1; mcv 88.9 ;plt 121; glucose 164; bun 17; creat 1.36; k+3.8; na++134; liver normal albumin 3.2; blood culture: no growth; urine culture: no growth 03-02-15: wbc 8.1; hgb 14.1; hct 40.6; mcv 88.8; plt 108; glucose 162; bun 18; creat 1.26; k+4.1; na++136; liver normal albumin 3.5; mag 1.4 03-06-15: wbc 5.3; hgb 12.6; hct 37.1; mcv 89.0; plt 166; glucose 190; bun 20; creat 1.13; k+3.3;  na++ 141; mag 1.8  03-09-15: glucose 212; bun 23.7; hct 1.20; k+ 4.8; na++143      Review of Systems Unable to perform ROS: Other  Physical Exam Constitutional: No distress.  Eyes: Conjunctivae are normal.  Neck: Neck supple. No JVD present. No thyromegaly present.  Cardiovascular: Normal rate, regular rhythm and intact distal pulses.   Respiratory: Effort normal and breath sounds normal. No respiratory distress. He has no wheezes.  GI: Soft. Bowel sounds are normal. He exhibits no distension. There is no tenderness.  Musculoskeletal: He exhibits no edema.  Able to move all extremities   Lymphadenopathy:    He has no cervical adenopathy.  Neurological: He is alert.  Skin: Skin is warm and dry. He is not diaphoretic.  Psychiatric: He has a normal mood and affect     ASSESSMENT/ PLAN:  1. Hypertension: is stable will continue vasotec 20 mg daily   2. Dyslipidemia: will continue pravachol 40 mg daily   3. BPH: will continue flomax 0.4 mg daily   4.  Edema: is stable will continue lasix 20 mg daily   5. Afib: heart rate is stable; is on long term xarelto 15 mg daily   6. Diabetes: will continue glipizide 5 mg daily will have nursing check cbg twice daily and will monitor his status.   7. CVA: is neurologically stable; is on xarelto 15 mg daily; will monitor  8. History herpes zoster is taking neurontin 300 mg twice daily for postherpetic pain   9. Dysphagia: had FEES study on 03-29-15; is on nectar thick liquids; no signs of aspiration present will not make changes.       Synthia Innocenteborah Green NP [facility]  Contact [phone] Monday through Friday 8am- 5pm  After hours call [phone]

## 2015-05-09 ENCOUNTER — Other Ambulatory Visit (HOSPITAL_COMMUNITY): Payer: Self-pay | Admitting: Internal Medicine

## 2015-05-09 DIAGNOSIS — R1310 Dysphagia, unspecified: Secondary | ICD-10-CM

## 2015-05-17 ENCOUNTER — Ambulatory Visit (HOSPITAL_COMMUNITY)
Admission: RE | Admit: 2015-05-17 | Discharge: 2015-05-17 | Disposition: A | Discharge status: Home / Self Care | Payer: No Typology Code available for payment source | Source: Ambulatory Visit | Attending: Internal Medicine | Admitting: Internal Medicine

## 2015-05-17 DIAGNOSIS — R1310 Dysphagia, unspecified: Secondary | ICD-10-CM | POA: Insufficient documentation

## 2015-05-17 NOTE — Progress Notes (Signed)
05/17/15 1500  SLP Visit Information  SLP Received On 05/17/15  Subjective  Subjective pleasant, smiling; speaks minimal English  Pain Assessment  Pain Assessment No/denies pain  General Information  HPI 81 y.o. male with Past medical history of hypertension, diabetes mellitus, atrial fibrillation, [place] VASC 8, coronary artery disease, CVA aortic replacement. Recent hospital admission for confusion, SOB, during which he underwent MBS (03/03/15) - this revealed a moderate dysphagia with aspiration noted before and after the swallow.  Dysphagia 3, nectars were recommended at D/C. MD notes state pt had FEES on 9/27 - documentation not available.  11/13/13 MRI: Multiple bilateral cerebellar remote infarcts,in addition to moderate to severe white matter changes suggesting chronic small vessel ischemic disease with remote pontine and basal ganglia/left thalamic lacunar infarcts.  Type of Study MBS-Modified Barium Swallow Study  Previous Swallow Assessment see hx  Diet Prior to this Study Regular;Nectar-thick liquids (thin liquid trials with SLP services)  Temperature Spikes Noted No  Respiratory Status Room air  History of Recent Intubation No  Behavior/Cognition Alert;Cooperative;Pleasant mood  Oral Cavity Assessment WFL  Oral Care Completed by SLP No  Vision Functional for self feeding  Self-Feeding Abilities Able to feed self  Patient Positioning Upright in chair  Baseline Vocal Quality Normal  Anatomy WFL  Pharyngeal Secretions Not observed secondary MBS  Oral Motor/Sensory Function  Overall Oral Motor/Sensory Function WFL  Oral Preparation/Oral Phase  Oral Phase WFL (functional mastication of cracker/barium, purees, and adequa)  Pharyngeal Phase  Pharyngeal Phase Thin;Nectar;Solids  Pharyngeal - Nectar  Pharyngeal- Nectar Cup Delayed swallow initiation-vallecula;Penetration/Aspiration during swallow;Pharyngeal residue - valleculae  Pharyngeal- Nectar Straw Delayed swallow  initiation-vallecula;Penetration/Aspiration during swallow;Pharyngeal residue - valleculae  Pharyngeal Material enters airway, remains ABOVE vocal cords and not ejected out  Pharyngeal Material enters airway, remains ABOVE vocal cords and not ejected out  Pharyngeal - Thin  Pharyngeal- Thin Cup Delayed swallow initiation-pyriform sinuses;Reduced airway/laryngeal closure;Penetration/Aspiration during swallow;Trace aspiration  Pharyngeal- Thin Straw Delayed swallow initiation-pyriform sinuses;Reduced airway/laryngeal closure;Penetration/Aspiration during swallow (chin tuck - no aspiration but did not prevent penetration)  Pharyngeal Material enters airway, passes BELOW cords without attempt by patient to eject out (silent aspiration)  Pharyngeal Material enters airway, remains ABOVE vocal cords and not ejected out  Pharyngeal - Solids  Pharyngeal- Puree Delayed swallow initiation-vallecula;Reduced tongue base retraction;Pharyngeal residue - valleculae (mild)  Pharyngeal- Mechanical Soft Delayed swallow initiation-vallecula;Reduced tongue base retraction;Pharyngeal residue - valleculae (mild)  Pharyngeal- Pill NT  Clinical Impression  Therapy Diagnosis Mild pharyngeal phase dysphagia  Clinical Impression Pt presents with a mild pharyngeal dysphagia c/b persisting intermittent aspiration of thin liquids - today, aspiration was trace and did not elicit a cough response.  A chin tuck was associated with penetration, but aspiration was not observed (under condition of thin liquid from straw.)  Solid foods/purees were masticated functionally, with swallow trigger occurring at level of valleculae; minimal residue s/p swallow.  Nectar liquids penetrated under the epiglottis but were not aspirated.  Recommend continued SLP f/u; recommend trials of thin liquid with chin tuck to determine if pt can generalize to functional eating situations.  Impulsivity/comprehension and insight into deficits may impact  carryover, per discussion with SNF SLP.    Swallow Evaluation Recommendations  SLP Diet Recommendations Regular solids;Nectar thick liquid;Thin liquid (thin liquid trials with chin tuck)  Liquid Administration via Cup  Medication Administration (per prior orders from SNF)  Supervision Patient able to self feed;Full supervision/cueing for compensatory strategies  Compensations Small sips/bites;Chin tuck (with thin liquids)  Treatment Plan  Oral Care Recommendations Oral care BID  Treatment Recommendations Defer treatment plan to f/u with SLP  Follow up Recommendations Skilled Nursing facility  Individuals Consulted  Consulted and Agree with Results and Recommendations (dtr, SLP from SNF)  Report Sent to  Primary SLP  SLP Time Calculation  SLP Start Time (ACUTE ONLY) 1445  SLP Stop Time (ACUTE ONLY) 1515  SLP Time Calculation (min) (ACUTE ONLY) 30 min  SLP G-Codes **NOT FOR INPATIENT CLASS**  Functional Assessment Tool Used clinical judgment  Functional Limitations Swallowing  Swallow Current Status (Z6109(G8996) CJ  Swallow Goal Status (U0454(G8997) CJ  Swallow Discharge Status (U9811(G8998) CJ  SLP Evaluations  $ SLP Speech Visit 1 Procedure  SLP Evaluations  $MBS Swallow Outpatient 1 Procedure

## 2015-05-23 ENCOUNTER — Ambulatory Visit (HOSPITAL_COMMUNITY): Payer: Medicare Other

## 2015-05-23 ENCOUNTER — Other Ambulatory Visit (HOSPITAL_COMMUNITY): Payer: Medicare Other

## 2015-05-30 ENCOUNTER — Encounter: Payer: Self-pay | Admitting: Internal Medicine

## 2015-05-30 ENCOUNTER — Non-Acute Institutional Stay (SKILLED_NURSING_FACILITY): Payer: Medicare Other | Admitting: Internal Medicine

## 2015-05-30 DIAGNOSIS — J9601 Acute respiratory failure with hypoxia: Secondary | ICD-10-CM

## 2015-05-30 DIAGNOSIS — A419 Sepsis, unspecified organism: Secondary | ICD-10-CM

## 2015-05-30 DIAGNOSIS — I482 Chronic atrial fibrillation, unspecified: Secondary | ICD-10-CM

## 2015-05-30 DIAGNOSIS — E785 Hyperlipidemia, unspecified: Secondary | ICD-10-CM | POA: Diagnosis not present

## 2015-05-30 DIAGNOSIS — I10 Essential (primary) hypertension: Secondary | ICD-10-CM | POA: Diagnosis not present

## 2015-05-30 DIAGNOSIS — N400 Enlarged prostate without lower urinary tract symptoms: Secondary | ICD-10-CM | POA: Diagnosis not present

## 2015-05-30 DIAGNOSIS — E1149 Type 2 diabetes mellitus with other diabetic neurological complication: Secondary | ICD-10-CM

## 2015-05-30 DIAGNOSIS — Z954 Presence of other heart-valve replacement: Secondary | ICD-10-CM | POA: Diagnosis not present

## 2015-05-30 DIAGNOSIS — J189 Pneumonia, unspecified organism: Secondary | ICD-10-CM | POA: Diagnosis not present

## 2015-05-30 DIAGNOSIS — Z952 Presence of prosthetic heart valve: Secondary | ICD-10-CM

## 2015-05-30 DIAGNOSIS — R6520 Severe sepsis without septic shock: Secondary | ICD-10-CM | POA: Diagnosis not present

## 2015-05-30 DIAGNOSIS — Z8673 Personal history of transient ischemic attack (TIA), and cerebral infarction without residual deficits: Secondary | ICD-10-CM | POA: Diagnosis not present

## 2015-05-30 DIAGNOSIS — J9600 Acute respiratory failure, unspecified whether with hypoxia or hypercapnia: Secondary | ICD-10-CM | POA: Insufficient documentation

## 2015-05-30 NOTE — Progress Notes (Signed)
MRN: [id number] Name: Harold Jones  Sex: male Age: 81 y.o. DOB: 04/26/1928  PSC #: [facility] Facility/Room:132B Level Of Care: SNF Provider: Merrilee SeashoreALEXANDER, Hubert Derstine D Emergency Contacts: Extended Emergency Contact Information Primary Emergency Contact: [name],Ljerka Address: [address]          [place], [place] Home Phone: [phone] Relation: Grandaughter Secondary Emergency Contact: Lieutenant DiegoKubiesak,[place]  [place] Mobile Phone: [phone] Relation: Daughter  Code Status:   Allergies: Review of patient's allergies indicates no known allergies.  Chief Complaint  Patient presents with  . Discharge Note    HPI: Patient is 81 y.o. male with hypertension, diabetes mellitus, atrial fibrillation, [place] VASC 8, coronary artery disease, CVA , aortic replacement who was hospitalized for acute respiratory failure 2/2 PNA and sepsis, admitted o SNF for generalized weakness who is now ready to be d/c to home.  Past Medical History  Diagnosis Date  . Hypertension   . Diabetes mellitus   . FIBRILLATION, ATRIAL 08/31/2002    Qualifier: Diagnosis of  By: Barbaraann Barthelankins MD, [place]    . CVA 02/28/2004    Annotation: embolic Qualifier: Diagnosis of  By: Barbaraann Barthelankins MD, [place]    . COGNITIVE DEFICITS DUE CEREBROVASCULAR DISEASE 09/09/2009    Qualifier: Diagnosis of  By: Delrae AlfredMulberry MD, Lanora ManisElizabeth    . CORONARY ARTERY DISEASE 02/27/2007    Qualifier: Diagnosis of  By: Barbaraann Barthelankins MD, [place]    . DYSLIPIDEMIA 02/27/2007    Qualifier: Diagnosis of  By: Barbaraann Barthelankins MD, [place]    . Bradycardia   . Aortic valve disorder     Past Surgical History  Procedure Laterality Date  . Cardiac surgery        Medication List       This list is accurate as of: 05/30/15  7:08 PM.  Always use your most recent med list.               furosemide 20 MG tablet  Commonly known as:  LASIX  Take 20 mg by mouth daily.     gabapentin 300 MG capsule  Commonly known as:  NEURONTIN   Take 300 mg by mouth 2 (two) times daily.     glipiZIDE 5 MG tablet  Commonly known as:  GLUCOTROL  Take 5 mg by mouth daily before breakfast.     lisinopril 20 MG tablet  Commonly known as:  PRINIVIL,ZESTRIL  Take 20 mg by mouth daily.     pravastatin 40 MG tablet  Commonly known as:  PRAVACHOL  Take 40 mg by mouth daily.     Rivaroxaban 15 MG Tabs tablet  Commonly known as:  XARELTO  Take 15 mg by mouth daily.     tamsulosin 0.4 MG Caps capsule  Commonly known as:  FLOMAX  Take 0.4 mg by mouth daily after supper. 30 minutes after the same meal every day.        Meds ordered this encounter  Medications  . lisinopril (PRINIVIL,ZESTRIL) 20 MG tablet    Sig: Take 20 mg by mouth daily.    Immunization History  Administered Date(s) Administered  . Influenza Whole 04/01/2006, 05/18/2008, 07/22/2009  . PPD Test 03/22/2015  . Pneumococcal Polysaccharide-23 04/01/2005, 10/10/2011  . Td 03/02/1997, 11/16/2008  . Tdap 11/12/2013    Social History  Substance Use Topics  . Smoking status: Never Smoker   . Smokeless tobacco: Never Used  . Alcohol Use: No    Filed Vitals:   05/30/15 1853  BP: 138/86  Pulse: 68  Temp: 98 F (36.7 C)  Resp: 18    Physical Exam  GENERAL APPEARANCE: Alert, conversant. No acute distress.  HEENT: Unremarkable. RESPIRATORY: Breathing is even, unlabored. Lung sounds are clear   CARDIOVASCULAR: Heart RRR no murmurs, rubs or gallops. No peripheral edema.  GASTROINTESTINAL: Abdomen is soft, non-tender, not distended w/ normal bowel sounds.  NEUROLOGIC: Cranial nerves 2-12 grossly intact. Moves all extremities  Patient Active Problem List   Diagnosis Date Noted  . Severe sepsis (HCC) 05/30/2015  . Acute respiratory failure (HCC) 05/30/2015  . History of stroke 03/15/2015  . Benign hypertensive heart disease without heart failure 03/08/2015  . 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 BPH without  obstruction/lower urinary tract symptoms 03/08/2015  . Post herpetic neuralgia 03/08/2015  . CAP (community acquired pneumonia) 03/01/2015  . Syncope and collapse 11/12/2013  . Nasal fracture 11/12/2013  . Encephalopathy acute 06/23/2013  . Diabetes mellitus, type 2 (HCC) 06/23/2013  . Hypoglycemia 06/23/2013  . Supratherapeutic INR 06/23/2013  . Zoster 10/12/2011  . Altered mental status 10/08/2011  . GLAUCOMA 09/08/2010  . KNEE PAIN, LEFT 11/22/2009  . COGNITIVE DEFICITS DUE CEREBROVASCULAR DISEASE 09/09/2009  . CAROTID BRUIT, RIGHT 01/18/2009  . GASTROINTESTINAL HEMORRHAGE 09/26/2007  . OSTEOARTHRITIS 03/18/2007  . Dyslipidemia 02/27/2007  . CORONARY ARTERY DISEASE 02/27/2007  . Essential hypertension 02/25/2007  . Cerebral artery occlusion with cerebral infarction (HCC) 02/28/2004  . ASCENDING AORTIC ANEURYSM 02/28/2004  . H/O aortic valve replacement 02/28/2004  . FIBRILLATION, ATRIAL 08/31/2002    CBC    Component Value Date/Time   WBC 5.3 03/06/2015 0542   RBC 4.17* 03/06/2015 0542   HGB 12.6* 03/06/2015 0542   HCT 37.1* 03/06/2015 0542   PLT 166 03/06/2015 0542   MCV 89.0 03/06/2015 0542   LYMPHSABS 1.0 03/02/2015 0730   MONOABS 0.8 03/02/2015 0730   EOSABS 0.1 03/02/2015 0730   BASOSABS 0.0 03/02/2015 0730    CMP     Component Value Date/Time   NA 142 03/07/2015 0830   K 4.3 03/07/2015 0830   CL 110 03/07/2015 0830   CO2 24 03/07/2015 0830   GLUCOSE 259* 03/07/2015 0830   GLUCOSE 133* 05/14/2006 0728   BUN 21* 03/07/2015 0830   CREATININE 1.17 03/07/2015 0830   CALCIUM 9.5 03/07/2015 0830   PROT 7.1 03/02/2015 0730   ALBUMIN 3.5 03/02/2015 0730   AST 22 03/02/2015 0730   ALT 12* 03/02/2015 0730   ALKPHOS 29* 03/02/2015 0730   BILITOT 1.6* 03/02/2015 0730   GFRNONAA 55* 03/07/2015 0830   GFRAA >60 03/07/2015 0830    Assessment and Plan  Pt is ready to be d/c to home with HH/OT/PT/ST. Orders for DMEs and Rx's have been written  Time spent 30  minutes Margit Hanks, MD

## 2015-06-26 ENCOUNTER — Other Ambulatory Visit: Payer: Self-pay | Admitting: Internal Medicine

## 2015-08-15 ENCOUNTER — Inpatient Hospital Stay (HOSPITAL_COMMUNITY)
Admission: EM | Admit: 2015-08-15 | Discharge: 2015-08-18 | DRG: 243 | Disposition: A | Discharge status: Skilled Nursing Facility | Payer: Medicare Other | Attending: Cardiovascular Disease | Admitting: Cardiovascular Disease

## 2015-08-15 ENCOUNTER — Emergency Department (HOSPITAL_COMMUNITY): Payer: Medicare Other

## 2015-08-15 ENCOUNTER — Encounter (HOSPITAL_COMMUNITY): Payer: Self-pay | Admitting: Emergency Medicine

## 2015-08-15 DIAGNOSIS — Z8673 Personal history of transient ischemic attack (TIA), and cerebral infarction without residual deficits: Secondary | ICD-10-CM | POA: Diagnosis not present

## 2015-08-15 DIAGNOSIS — R531 Weakness: Secondary | ICD-10-CM | POA: Diagnosis present

## 2015-08-15 DIAGNOSIS — R001 Bradycardia, unspecified: Secondary | ICD-10-CM

## 2015-08-15 DIAGNOSIS — I25119 Atherosclerotic heart disease of native coronary artery with unspecified angina pectoris: Secondary | ICD-10-CM | POA: Diagnosis present

## 2015-08-15 DIAGNOSIS — E119 Type 2 diabetes mellitus without complications: Secondary | ICD-10-CM | POA: Diagnosis present

## 2015-08-15 DIAGNOSIS — E785 Hyperlipidemia, unspecified: Secondary | ICD-10-CM | POA: Diagnosis present

## 2015-08-15 DIAGNOSIS — L7632 Postprocedural hematoma of skin and subcutaneous tissue following other procedure: Secondary | ICD-10-CM | POA: Diagnosis not present

## 2015-08-15 DIAGNOSIS — I482 Chronic atrial fibrillation: Secondary | ICD-10-CM | POA: Diagnosis present

## 2015-08-15 DIAGNOSIS — Z7984 Long term (current) use of oral hypoglycemic drugs: Secondary | ICD-10-CM | POA: Diagnosis not present

## 2015-08-15 DIAGNOSIS — Z79899 Other long term (current) drug therapy: Secondary | ICD-10-CM

## 2015-08-15 DIAGNOSIS — I4891 Unspecified atrial fibrillation: Secondary | ICD-10-CM | POA: Diagnosis not present

## 2015-08-15 DIAGNOSIS — Z7901 Long term (current) use of anticoagulants: Secondary | ICD-10-CM

## 2015-08-15 DIAGNOSIS — I442 Atrioventricular block, complete: Secondary | ICD-10-CM | POA: Diagnosis present

## 2015-08-15 DIAGNOSIS — I10 Essential (primary) hypertension: Secondary | ICD-10-CM | POA: Diagnosis present

## 2015-08-15 DIAGNOSIS — Y92239 Unspecified place in hospital as the place of occurrence of the external cause: Secondary | ICD-10-CM | POA: Diagnosis present

## 2015-08-15 DIAGNOSIS — Z950 Presence of cardiac pacemaker: Secondary | ICD-10-CM

## 2015-08-15 DIAGNOSIS — I4892 Unspecified atrial flutter: Secondary | ICD-10-CM | POA: Diagnosis present

## 2015-08-15 DIAGNOSIS — H9190 Unspecified hearing loss, unspecified ear: Secondary | ICD-10-CM | POA: Diagnosis present

## 2015-08-15 DIAGNOSIS — Z953 Presence of xenogenic heart valve: Secondary | ICD-10-CM

## 2015-08-15 DIAGNOSIS — I459 Conduction disorder, unspecified: Secondary | ICD-10-CM

## 2015-08-15 HISTORY — DX: Bradycardia, unspecified: R00.1

## 2015-08-15 LAB — BASIC METABOLIC PANEL
Anion gap: 11 (ref 5–15)
BUN: 21 mg/dL (ref 4–21)
BUN: 21 mg/dL — AB (ref 6–20)
CALCIUM: 9.2 mg/dL (ref 8.9–10.3)
CHLORIDE: 108 mmol/L (ref 101–111)
CO2: 24 mmol/L (ref 22–32)
Creatinine, Ser: 1.31 mg/dL — ABNORMAL HIGH (ref 0.61–1.24)
Creatinine: 1.3 mg/dL (ref 0.6–1.3)
GFR calc Af Amer: 56 mL/min — ABNORMAL LOW (ref >60)
GFR, EST NON AFRICAN AMERICAN: 48 mL/min — AB (ref >60)
GLUCOSE: 99 mg/dL
Glucose, Bld: 99 mg/dL (ref 65–99)
POTASSIUM: 3.8 mmol/L (ref 3.4–5.3)
POTASSIUM: 3.8 mmol/L (ref 3.5–5.1)
SODIUM: 143 mmol/L (ref 137–147)
SODIUM: 143 mmol/L (ref 135–145)

## 2015-08-15 LAB — URINALYSIS, ROUTINE W REFLEX MICROSCOPIC
Bilirubin Urine: NEGATIVE
GLUCOSE, UA: NEGATIVE mg/dL
Ketones, ur: NEGATIVE mg/dL
LEUKOCYTES UA: NEGATIVE
Nitrite: NEGATIVE
Protein, ur: NEGATIVE mg/dL
SPECIFIC GRAVITY, URINE: 1.012 (ref 1.005–1.030)
pH: 5 (ref 5.0–8.0)

## 2015-08-15 LAB — CBC WITH DIFFERENTIAL/PLATELET
BASOS PCT: 0 %
Basophils Absolute: 0 10*3/uL (ref 0.0–0.1)
Eosinophils Absolute: 0.1 10*3/uL (ref 0.0–0.7)
Eosinophils Relative: 2 %
HEMATOCRIT: 37.4 % — AB (ref 39.0–52.0)
HEMOGLOBIN: 12.4 g/dL — AB (ref 13.0–17.0)
Lymphocytes Relative: 40 %
Lymphs Abs: 1.9 10*3/uL (ref 0.7–4.0)
MCH: 29 pg (ref 26.0–34.0)
MCHC: 33.2 g/dL (ref 30.0–36.0)
MCV: 87.4 fL (ref 78.0–100.0)
Monocytes Absolute: 0.4 10*3/uL (ref 0.1–1.0)
Monocytes Relative: 8 %
NEUTROS ABS: 2.3 10*3/uL (ref 1.7–7.7)
NEUTROS PCT: 50 %
Platelets: 159 10*3/uL (ref 150–400)
RBC: 4.28 MIL/uL (ref 4.22–5.81)
RDW: 13.2 % (ref 11.5–15.5)
WBC: 4.7 10*3/uL (ref 4.0–10.5)

## 2015-08-15 LAB — I-STAT VENOUS BLOOD GAS, ED
ACID-BASE DEFICIT: 2 mmol/L (ref 0.0–2.0)
BICARBONATE: 24.5 meq/L — AB (ref 20.0–24.0)
O2 Saturation: 51 %
PCO2 VEN: 49.6 mmHg (ref 45.0–50.0)
PO2 VEN: 30 mmHg (ref 30.0–45.0)
TCO2: 26 mmol/L (ref 0–100)
pH, Ven: 7.301 — ABNORMAL HIGH (ref 7.250–7.300)

## 2015-08-15 LAB — I-STAT CHEM 8, ED
BUN: 24 mg/dL — ABNORMAL HIGH (ref 6–20)
CALCIUM ION: 1.22 mmol/L (ref 1.13–1.30)
CHLORIDE: 106 mmol/L (ref 101–111)
Creatinine, Ser: 1.2 mg/dL (ref 0.61–1.24)
Glucose, Bld: 93 mg/dL (ref 65–99)
HCT: 40 % (ref 39.0–52.0)
HEMOGLOBIN: 13.6 g/dL (ref 13.0–17.0)
Potassium: 3.7 mmol/L (ref 3.5–5.1)
SODIUM: 144 mmol/L (ref 135–145)
TCO2: 25 mmol/L (ref 0–100)

## 2015-08-15 LAB — URINE MICROSCOPIC-ADD ON

## 2015-08-15 LAB — I-STAT ARTERIAL BLOOD GAS, ED
ACID-BASE DEFICIT: 2 mmol/L (ref 0.0–2.0)
BICARBONATE: 23.1 meq/L (ref 20.0–24.0)
O2 SAT: 97 %
PO2 ART: 92 mmHg (ref 80.0–100.0)
TCO2: 24 mmol/L (ref 0–100)
pCO2 arterial: 38.4 mmHg (ref 35.0–45.0)
pH, Arterial: 7.388 (ref 7.350–7.450)

## 2015-08-15 LAB — CBC AND DIFFERENTIAL
HCT: 37 % — AB (ref 41–53)
HEMOGLOBIN: 12.4 g/dL — AB (ref 13.5–17.5)
Platelets: 159 10*3/uL (ref 150–399)
WBC: 4.7 10^3/mL

## 2015-08-15 LAB — I-STAT TROPONIN, ED: TROPONIN I, POC: 0.02 ng/mL (ref 0.00–0.08)

## 2015-08-15 LAB — PROTIME-INR
INR: 1.77 — ABNORMAL HIGH (ref 0.00–1.49)
Prothrombin Time: 20.6 seconds — ABNORMAL HIGH (ref 11.6–15.2)

## 2015-08-15 LAB — TSH: TSH: 0.357 u[IU]/mL (ref 0.350–4.500)

## 2015-08-15 LAB — I-STAT CG4 LACTIC ACID, ED: Lactic Acid, Venous: 1.1 mmol/L (ref 0.5–2.0)

## 2015-08-15 LAB — TROPONIN I: Troponin I: <0.03 ng/mL (ref <0.031)

## 2015-08-15 MED ORDER — SODIUM CHLORIDE 0.9 % IR SOLN: 80.0000 mg | Status: DC

## 2015-08-15 MED ORDER — CHLORHEXIDINE GLUCONATE 4 % EX LIQD
60.0000 mL | Freq: Once | CUTANEOUS | Status: AC
  Administered 2015-08-15: 4 via TOPICAL

## 2015-08-15 MED ORDER — CHLORHEXIDINE GLUCONATE 4 % EX LIQD
60.0000 mL | Freq: Once | CUTANEOUS | Status: AC
  Administered 2015-08-15: 4 via TOPICAL
  Filled 2015-08-15: qty 15

## 2015-08-15 MED ORDER — LISINOPRIL 10 MG PO TABS
20.0000 mg | ORAL_TABLET | Freq: Every day | ORAL | Status: DC
  Administered 2015-08-17: 20 mg via ORAL
  Filled 2015-08-15: qty 2

## 2015-08-15 MED ORDER — ATROPINE SULFATE 1 MG/ML IJ SOLN
0.5000 mg | Freq: Once | INTRAMUSCULAR | Status: AC
  Administered 2015-08-15: 0.5 mg via INTRAVENOUS
  Filled 2015-08-15: qty 1

## 2015-08-15 MED ORDER — FUROSEMIDE 20 MG PO TABS
20.0000 mg | ORAL_TABLET | Freq: Every day | ORAL | Status: DC
  Administered 2015-08-17 – 2015-08-18 (×2): 20 mg via ORAL
  Filled 2015-08-15 (×2): qty 1

## 2015-08-15 MED ORDER — TAMSULOSIN HCL 0.4 MG PO CAPS
0.4000 mg | ORAL_CAPSULE | Freq: Every day | ORAL | Status: DC
  Administered 2015-08-16 – 2015-08-17 (×2): 0.4 mg via ORAL
  Filled 2015-08-15 (×2): qty 1

## 2015-08-15 MED ORDER — SODIUM CHLORIDE 0.9 % IV SOLN: 250.0000 mL | INTRAVENOUS | Status: DC

## 2015-08-15 MED ORDER — SODIUM CHLORIDE 0.9% FLUSH
3.0000 mL | Freq: Two times a day (BID) | INTRAVENOUS | Status: DC
  Administered 2015-08-16 (×2): 3 mL via INTRAVENOUS

## 2015-08-15 MED ORDER — CEFAZOLIN SODIUM-DEXTROSE 2-3 GM-% IV SOLR
2.0000 g | INTRAVENOUS | Status: AC
Start: 2015-08-16 — End: 2015-08-16
  Administered 2015-08-16: 2 g via INTRAVENOUS

## 2015-08-15 MED ORDER — PRAVASTATIN SODIUM 40 MG PO TABS
40.0000 mg | ORAL_TABLET | Freq: Every day | ORAL | Status: DC
  Administered 2015-08-15 – 2015-08-17 (×3): 40 mg via ORAL
  Filled 2015-08-15 (×3): qty 1

## 2015-08-15 MED ORDER — SODIUM CHLORIDE 0.9 % IV SOLN
INTRAVENOUS | Status: DC
  Administered 2015-08-16: 03:00:00 via INTRAVENOUS

## 2015-08-15 MED ORDER — GLIPIZIDE 5 MG PO TABS
5.0000 mg | ORAL_TABLET | Freq: Every day | ORAL | Status: DC
  Administered 2015-08-17 – 2015-08-18 (×2): 5 mg via ORAL
  Filled 2015-08-15 (×2): qty 1

## 2015-08-15 MED ORDER — SODIUM CHLORIDE 0.9% FLUSH: 3.0000 mL | INTRAVENOUS | Status: DC | PRN

## 2015-08-15 MED ORDER — GABAPENTIN 300 MG PO CAPS
300.0000 mg | ORAL_CAPSULE | Freq: Two times a day (BID) | ORAL | Status: DC
  Administered 2015-08-15 – 2015-08-18 (×5): 300 mg via ORAL
  Filled 2015-08-15 (×5): qty 1

## 2015-08-15 NOTE — ED Notes (Signed)
Pt arrives from home via [facility] reporting weakness x 2days, increasing SOB, new onset incontinence.  EMS reports pt in afib, HR 38-50, enroute.  EMS reports posterior EKG unremarkable.  EMS reports giving 325 ASA.  Pt AOX4, grandson at bedside.  Dr. Manus Gunning made aware.

## 2015-08-15 NOTE — ED Notes (Signed)
Dr. Salena Saner., cardiologist, MD at bedside.

## 2015-08-15 NOTE — ED Notes (Signed)
Per family, they requests to be called when pt is admitted or when admitting doctors are rounding. Granddaughter is Elberta Spaniel [phone] or other family member is Shana Chute [phone]

## 2015-08-15 NOTE — H&P (Signed)
Chief Complaint:  Dyspnea and generalized weakness  Cardiologist: taylor  HPI:  This is a 82 y.o. male with a past medical history significant for long-standing permanent atrial fibrillation with slow ventricular response, now presenting with symptomatic bradycardia. He is originally from [place] and is accompanied by his grandson Ancil Linsey who translates for him.  He was hospitalized in 2015 after a fall and had significant bradycardia that improved with his continuation of AV nodal blocking agents. The decision was made at that time that a pacemaker was not yet necessary. He has generally done well over the last couple of years but recently has developed marked dyspnea with activity as well as generalized fatigue. He has not had syncope, but is often dizzy.  He denies (via his translator) cough, fever, chills, nausea, vomiting, urinary complaints (other than chronic prostatism), bleeding, new neurological complaints, claudication, angina.  He tolerates anticoagulation with Xarelto without any bleeding complications. 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   His wife passed away just a few days ago. He is grieving, but although he is sad he appears to be emotionally well compensated. 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 He does not speak [place], but he has good support from his family.    he has an aortic valve biological prosthesis, placed many years ago. The prosthesis is known to have a stable mild paravalvular leak and mild chronically elevated gradients. Gradients were unchanged from previous studies when last checked by echo in May 2015 (peak 53, mean 23 mmHg). He has treated hypertension, diabetes mellitus on oral antidiabetics, treated hypercholesterolemia and is hard of hearing. He  was hospitalized in August 2016 with pneumonia and had some evidence of swallowing difficulties.  PMHx:  Past Medical History  Diagnosis Date  . Hypertension   . Diabetes mellitus   . FIBRILLATION, ATRIAL 08/31/2002    Qualifier: Diagnosis of  By: Radene Ou MD, [place]    . CVA 02/28/2004    Annotation: embolic Qualifier: Diagnosis of  By: Radene Ou MD, [place]    . COGNITIVE DEFICITS DUE CEREBROVASCULAR DISEASE 09/09/2009    Qualifier: Diagnosis of  By: Amil Amen MD, Benjamine Mola    . CORONARY ARTERY DISEASE 02/27/2007    Qualifier: Diagnosis of  By: Radene Ou MD, [place]    . DYSLIPIDEMIA 02/27/2007    Qualifier: Diagnosis of  By: Radene Ou MD, [place]    . Bradycardia   . Aortic valve disorder     Past Surgical History  Procedure Laterality Date  . Cardiac surgery      FAMHx:  No family history on file.  SOCHx:   reports that he has never smoked. He has never used smokeless tobacco. He reports that he does not drink alcohol or use illicit drugs.  ALLERGIES:  No Known Allergies  ROS: Pertinent items noted in HPI and remainder of comprehensive ROS otherwise negative.  HOME MEDS: No current facility-administered medications on file prior to encounter.   Current Outpatient Prescriptions on File Prior to Encounter  Medication Sig Dispense Refill  . furosemide (LASIX) 20 MG tablet Take 20 mg by mouth daily.    Marland Kitchen gabapentin (NEURONTIN) 300 MG capsule Take 300 mg by mouth 2 (two) times daily.    Marland Kitchen glipiZIDE (GLUCOTROL) 5 MG tablet Take 5 mg by mouth daily before breakfast.     .  lisinopril (PRINIVIL,ZESTRIL) 20 MG tablet Take 20 mg by mouth daily.    . pravastatin (PRAVACHOL) 40 MG tablet Take 40 mg by mouth daily.    . Rivaroxaban (XARELTO) 15 MG TABS tablet Take 15 mg by mouth daily.    . tamsulosin (FLOMAX) 0.4 MG CAPS capsule Take 0.4 mg by mouth daily after supper. 30 minutes after the same meal every day.  3    LABS/IMAGING: Results for orders placed or performed during the  hospital encounter of 08/15/15 (from the past 48 hour(s))  CBC with Differential/Platelet     Status: Abnormal   Collection Time: 08/15/15  5:05 PM  Result Value Ref Range   WBC 4.7 4.0 - 10.5 K/uL   RBC 4.28 4.22 - 5.81 MIL/uL   Hemoglobin 12.4 (L) 13.0 - 17.0 g/dL   HCT 37.4 (L) 39.0 - 52.0 %   MCV 87.4 78.0 - 100.0 fL   MCH 29.0 26.0 - 34.0 pg   MCHC 33.2 30.0 - 36.0 g/dL   RDW 13.2 11.5 - 15.5 %   Platelets 159 150 - 400 K/uL   Neutrophils Relative % 50 %   Neutro Abs 2.3 1.7 - 7.7 K/uL   Lymphocytes Relative 40 %   Lymphs Abs 1.9 0.7 - 4.0 K/uL   Monocytes Relative 8 %   Monocytes Absolute 0.4 0.1 - 1.0 K/uL   Eosinophils Relative 2 %   Eosinophils Absolute 0.1 0.0 - 0.7 K/uL   Basophils Relative 0 %   Basophils Absolute 0.0 0.0 - 0.1 K/uL  Basic metabolic panel     Status: Abnormal   Collection Time: 08/15/15  5:05 PM  Result Value Ref Range   Sodium 143 135 - 145 mmol/L   Potassium 3.8 3.5 - 5.1 mmol/L   Chloride 108 101 - 111 mmol/L   CO2 24 22 - 32 mmol/L   Glucose, Bld 99 65 - 99 mg/dL   BUN 21 (H) 6 - 20 mg/dL   Creatinine, Ser 1.31 (H) 0.61 - 1.24 mg/dL   Calcium 9.2 8.9 - 10.3 mg/dL   GFR calc non Af Amer 48 (L) >60 mL/min   GFR calc Af Amer 56 (L) >60 mL/min    Comment: (NOTE) The eGFR has been calculated using the CKD EPI equation. This calculation has not been validated in all clinical situations. eGFR's persistently <60 mL/min signify possible Chronic Kidney Disease.    Anion gap 11 5 - 15  Protime-INR     Status: Abnormal   Collection Time: 08/15/15  5:05 PM  Result Value Ref Range   Prothrombin Time 20.6 (H) 11.6 - 15.2 seconds   INR 1.77 (H) 0.00 - 1.49  Troponin I     Status: None   Collection Time: 08/15/15  5:05 PM  Result Value Ref Range   Troponin I <0.03 <0.031 ng/mL    Comment:        NO INDICATION OF MYOCARDIAL INJURY.   I-stat troponin, ED     Status: None   Collection Time: 08/15/15  5:13 PM  Result Value Ref Range   Troponin i,  poc 0.02 0.00 - 0.08 ng/mL   Comment 3            Comment: Due to the release kinetics of cTnI, a negative result within the first hours of the onset of symptoms does not rule out myocardial infarction with certainty. If myocardial infarction is still suspected, repeat the test at appropriate intervals.   I-stat chem 8, ed  Status: Abnormal   Collection Time: 08/15/15  5:14 PM  Result Value Ref Range   Sodium 144 135 - 145 mmol/L   Potassium 3.7 3.5 - 5.1 mmol/L   Chloride 106 101 - 111 mmol/L   BUN 24 (H) 6 - 20 mg/dL   Creatinine, Ser 1.20 0.61 - 1.24 mg/dL   Glucose, Bld 93 65 - 99 mg/dL   Calcium, Ion 1.22 1.13 - 1.30 mmol/L   TCO2 25 0 - 100 mmol/L   Hemoglobin 13.6 13.0 - 17.0 g/dL   HCT 40.0 39.0 - 52.0 %  I-Stat venous blood gas, ED     Status: Abnormal   Collection Time: 08/15/15  5:14 PM  Result Value Ref Range   pH, Ven 7.301 (H) 7.250 - 7.300   pCO2, Ven 49.6 45.0 - 50.0 mmHg   pO2, Ven 30.0 30.0 - 45.0 mmHg   Bicarbonate 24.5 (H) 20.0 - 24.0 mEq/L   TCO2 26 0 - 100 mmol/L   O2 Saturation 51.0 %   Acid-base deficit 2.0 0.0 - 2.0 mmol/L   Patient temperature HIDE    Sample type VENOUS    Comment NOTIFIED PHYSICIAN   I-Stat CG4 Lactic Acid, ED     Status: None   Collection Time: 08/15/15  5:15 PM  Result Value Ref Range   Lactic Acid, Venous 1.10 0.5 - 2.0 mmol/L  I-Stat Arterial Blood Gas, ED - (order at [facility] and [facility] only)     Status: None   Collection Time: 08/15/15  5:57 PM  Result Value Ref Range   pH, Arterial 7.388 7.350 - 7.450   pCO2 arterial 38.4 35.0 - 45.0 mmHg   pO2, Arterial 92.0 80.0 - 100.0 mmHg   Bicarbonate 23.1 20.0 - 24.0 mEq/L   TCO2 24 0 - 100 mmol/L   O2 Saturation 97.0 %   Acid-base deficit 2.0 0.0 - 2.0 mmol/L   Patient temperature HIDE    Sample type ARTERIAL    Dg Chest Portable 1 View  08/15/2015  CLINICAL DATA:  Weakness for 2 days, RIGHT lower chest pain, bradycardia, diabetes mellitus, hypertension, atrial fibrillation,  coronary artery disease EXAM: PORTABLE CHEST 1 VIEW COMPARISON:  Portable exam 1714 hours compared 03/01/2015 FINDINGS: External pacing leads in EKG leads project over chest. Enlargement of cardiac silhouette post AVR. Atherosclerotic calcification and mild elongation of thoracic aorta. Mediastinal contours and pulmonary vascularity otherwise normal. Lungs clear. No pleural effusion or pneumothorax. Bones demineralized. IMPRESSION: Minimal enlargement of cardiac silhouette post AVR. No acute abnormalities. Electronically Signed   By: Lavonia Dana M.D.   On: 08/15/2015 17:26    VITALS: Blood pressure 145/63, pulse 53, temperature 97.5 F (36.4 C), temperature source Oral, resp. rate 11, SpO2 100 %.  EXAM:  General: Alert, oriented x3, no distress Head: no evidence of trauma, PERRL, EOMI, no exophtalmos or lid lag, no myxedema, no xanthelasma; normal ears, nose and oropharynx Neck: normal jugular venous pulsations and no hepatojugular reflux; brisk carotid pulses without delay and no carotid bruits Chest: clear to auscultation, no signs of consolidation by percussion or palpation, normal fremitus, symmetrical and full respiratory excursions Cardiovascular: normal position and quality of the apical impulse, regular rhythm, normal first heart sound and normal  second heart sounds, no rubs or gallop, 2/6 early-mid peaking systolic ejection murmur, no diastolic murmur  Abdomen: no tenderness or distention, no masses by palpation, no abnormal pulsatility or arterial bruits, normal bowel sounds, no hepatosplenomegaly Extremities: no clubbing, cyanosis or edema; 2+ radial, ulnar  and brachial pulses bilaterally; 2+ right femoral, posterior tibial and dorsalis pedis pulses; 2+ left femoral, posterior tibial and dorsalis pedis pulses; no subclavian or femoral bruits Neurological: grossly nonfocal   IMPRESSION:  1. Atrial fibrillation with slow ventricular response and symptomatic bradycardia - he would  benefit from implantation of a single-chamber permanent pacemaker. This procedure was discussed with the patient and his family member. They will talk it over with the patient's daughter, who makes many of his medical decisions, but appear to be interested in the procedure since it does not involve general anesthesia or major thoracic surgery. The risks and benefits and possible complications of pacemakers were discussed in detail. He has a history of previous embolic strokes and interruption of his anticoagulant should be for the shortest possible time. Will hold his anticoagulant tonight. If the pacemaker cannot be implanted tomorrow, would start temporary intravenous heparin.  2. Aortic valve replacement with a biological prosthesis. While I don't think this has anything to do with his current symptoms (which can be attributed to bradycardia), this is a good opportunity to reevaluate his valve, which had evidence of moderately increased gradients and has not been imaged since 2015. His physical exam does not suggest severe aortic stenosis  3. HTN with fair control   4. DM,type 2 - Most recent A1c is from May 2015 at 8.2%   PLAN:  The patient's family will review the pacemaker surgery option and will tentatively schedule for as early as tomorrow with Dr. Lovena Le if they agree to go ahead.  Sanda Klein, MD, [facility] [phone] office [phone] pager  08/15/2015, 6:19 PM

## 2015-08-15 NOTE — ED Provider Notes (Signed)
CSN: [id number]     Arrival date & time 08/15/15  1646 History   First MD Initiated Contact with Patient 08/15/15 1653     Chief Complaint  Patient presents with  . Bradycardia  . Weakness     (Consider location/radiation/quality/duration/timing/severity/associated sxs/prior Treatment) HPI Comments: Level V caveat for language barrier. Grandson translating. Patient speaks [place]. Presents from home with a 2 day history of generalized weakness, fatigue and increasing shortness of breath. Developed some left-sided chest pain and the way to the hospital that is worse with palpation. Findings be bradycardic in the 30s for EMS. History of atrial fibrillation on xarelto. Notably patient's wife died 2 days ago. No fever, no cough. Endorses dizziness and lightheadedness. Lucila Maine does not report any heart issues other than history of bypass remotely. He does not know any other medical history the patient.  The history is provided by the patient and the EMS personnel. The history is limited by the condition of the patient and a language barrier.    Past Medical History  Diagnosis Date  . Hypertension   . Diabetes mellitus   . FIBRILLATION, ATRIAL 08/31/2002    Qualifier: Diagnosis of  By: Barbaraann Barthel MD, [place]    . CVA 02/28/2004    Annotation: embolic Qualifier: Diagnosis of  By: Barbaraann Barthel MD, [place]    . COGNITIVE DEFICITS DUE CEREBROVASCULAR DISEASE 09/09/2009    Qualifier: Diagnosis of  By: Delrae Alfred MD, Lanora Manis    . CORONARY ARTERY DISEASE 02/27/2007    Qualifier: Diagnosis of  By: Barbaraann Barthel MD, [place]    . DYSLIPIDEMIA 02/27/2007    Qualifier: Diagnosis of  By: Barbaraann Barthel MD, [place]    . Bradycardia   . Aortic valve disorder    Past Surgical History  Procedure Laterality Date  . Cardiac surgery     No family history on file. Social History  Substance Use Topics  . Smoking status: Never Smoker   . Smokeless tobacco: Never Used  . Alcohol Use: No    Review of Systems  Unable to  perform ROS: Other      Allergies  Review of patient's allergies indicates no known allergies.  Home Medications   Prior to Admission medications   Medication Sig Start Date End Date Taking? Authorizing Provider  furosemide (LASIX) 20 MG tablet Take 20 mg by mouth daily.    Historical Provider, MD  gabapentin (NEURONTIN) 300 MG capsule Take 300 mg by mouth 2 (two) times daily.    Historical Provider, MD  glipiZIDE (GLUCOTROL) 5 MG tablet Take 5 mg by mouth daily before breakfast.  11/14/13   Esperanza Sheets, MD  lisinopril (PRINIVIL,ZESTRIL) 20 MG tablet Take 20 mg by mouth daily.    Historical Provider, MD  pravastatin (PRAVACHOL) 40 MG tablet Take 40 mg by mouth daily.    Historical Provider, MD  Rivaroxaban (XARELTO) 15 MG TABS tablet Take 15 mg by mouth daily.    Historical Provider, MD  tamsulosin (FLOMAX) 0.4 MG CAPS capsule Take 0.4 mg by mouth daily after supper. 30 minutes after the same meal every day. 02/12/15   Historical Provider, MD   BP 148/87 mmHg  Pulse 53  Temp(Src) 97.5 F (36.4 C) (Oral)  Resp 18  SpO2 100% Physical Exam  Constitutional: He is oriented to person, place, and time. He appears well-developed and well-nourished. No distress.  Dry mucus membranes  HENT:  Head: Normocephalic and atraumatic.  Mouth/Throat: Oropharynx is clear and moist. No oropharyngeal exudate.  Eyes: Conjunctivae and EOM are  normal. Pupils are equal, round, and reactive to light.  Neck: Normal range of motion. Neck supple.  No meningismus.  Cardiovascular: Normal rate and intact distal pulses.   Murmur heard. Irregular bradycardia  Pulmonary/Chest: Effort normal and breath sounds normal. No respiratory distress. He exhibits no tenderness.  Abdominal: Soft. There is no tenderness. There is no rebound and no guarding.  Musculoskeletal: Normal range of motion. He exhibits no edema or tenderness.  Neurological: He is alert and oriented to person, place, and time. No cranial nerve  deficit. He exhibits normal muscle tone. Coordination normal.  No ataxia on finger to nose bilaterally. No pronator drift. 5/5 strength throughout. CN 2-12 intact.Equal grip strength. Sensation intact.   Skin: Skin is warm.  Psychiatric: He has a normal mood and affect. His behavior is normal.  Nursing note and vitals reviewed.   ED Course  Procedures (including critical care time) Labs Review Labs Reviewed  CBC WITH DIFFERENTIAL/PLATELET - Abnormal; Notable for the following:    Hemoglobin 12.4 (*)    HCT 37.4 (*)    All other components within normal limits  BASIC METABOLIC PANEL - Abnormal; Notable for the following:    BUN 21 (*)    Creatinine, Ser 1.31 (*)    GFR calc non Af Amer 48 (*)    GFR calc Af Amer 56 (*)    All other components within normal limits  PROTIME-INR - Abnormal; Notable for the following:    Prothrombin Time 20.6 (*)    INR 1.77 (*)    All other components within normal limits  URINALYSIS, ROUTINE W REFLEX MICROSCOPIC (NOT AT [facility]) - Abnormal; Notable for the following:    Hgb urine dipstick TRACE (*)    All other components within normal limits  URINE MICROSCOPIC-ADD ON - Abnormal; Notable for the following:    Squamous Epithelial / LPF 0-5 (*)    Bacteria, UA RARE (*)    Casts HYALINE CASTS (*)    All other components within normal limits  I-STAT CHEM 8, ED - Abnormal; Notable for the following:    BUN 24 (*)    All other components within normal limits  I-STAT VENOUS BLOOD GAS, ED - Abnormal; Notable for the following:    pH, Ven 7.301 (*)    Bicarbonate 24.5 (*)    All other components within normal limits  CULTURE, BLOOD (ROUTINE X 2)  CULTURE, BLOOD (ROUTINE X 2)  TROPONIN I  TSH  BASIC METABOLIC PANEL  I-STAT TROPOININ, ED  I-STAT ARTERIAL BLOOD GAS, ED  I-STAT CG4 LACTIC ACID, ED  I-STAT VENOUS BLOOD GAS, ED  I-STAT CG4 LACTIC ACID, ED    Imaging Review Dg Chest Portable 1 View  08/15/2015  CLINICAL DATA:  Weakness for 2 days,  RIGHT lower chest pain, bradycardia, diabetes mellitus, hypertension, atrial fibrillation, coronary artery disease EXAM: PORTABLE CHEST 1 VIEW COMPARISON:  Portable exam 1714 hours compared 03/01/2015 FINDINGS: External pacing leads in EKG leads project over chest. Enlargement of cardiac silhouette post AVR. Atherosclerotic calcification and mild elongation of thoracic aorta. Mediastinal contours and pulmonary vascularity otherwise normal. Lungs clear. No pleural effusion or pneumothorax. Bones demineralized. IMPRESSION: Minimal enlargement of cardiac silhouette post AVR. No acute abnormalities. Electronically Signed   By: Ulyses Southward M.D.   On: 08/15/2015 17:26   I have personally reviewed and evaluated these images and lab results as part of my medical decision-making.   EKG Interpretation   Date/Time:  Monday August 15 2015 16:49:52 EST Ventricular  Rate:  40 PR Interval:    QRS Duration: 141 QT Interval:  502 QTC Calculation: 409 R Axis:   -47 Text Interpretation:  Atrial flutter Nonspecific IVCD with LAD LVH with  secondary repolarization abnormality Anterior infarct, old Nonspecific ST  abnormality Confirmed by Roderic Lammert  MD, Terrelle Ruffolo [phone]) on 08/15/2015 5:18:40  PM      MDM   Final diagnoses:  Atrial fibrillation with slow ventricular response (HCC)  Weakness   2 days of generalized weakness, fatigue, shortness of breath now with chest pain. He is bradycardic on arrival. Blood pressure mental status are intact. EKG shows slow atrial fibrillation. Concern for possible heart block. Pacer plans are placed and patient is given IV atropine.  No response to atropine. Mental status and blood pressure remained stable. EKG shows slow atrial fibrillation without acute ST changes. No evidence of complete heart block. Rhythm is irregular  Chest x-ray is clear. There is no pericardial effusion seen on bedside ultrasound. Labs are remarkable for slight elevation of his creatinine.  Family  states compliance with his Xarelto. Doubt PE. Discussed with cardiology Dr. Herbie Baltimore and Dr. Royann Shivers they have seen the patient and feel he is candidate for pacemaker.   Patient will be admitted for probable pacemaker placement.  BP and mental status have remained stable in the ED.     EMERGENCY DEPARTMENT [place] CARDIAC EXAM "Study: Limited Ultrasound of the heart and pericardium"  INDICATIONS:Unstable Vital Signs Multiple views of the heart and pericardium are obtained with a multi-frequency probe.  PERFORMED AO:ZHYQMV  IMAGES ARCHIVED?: Yes  FINDINGS: No pericardial effusion, Normal contractility and Tamponade physiology absent  LIMITATIONS:  Emergent procedure  VIEWS USED: Subcostal 4 chamber  INTERPRETATION: Cardiac activity present, Pericardial effusioin absent, Cardiac tamponade absent and Decreased contractility  COMMENT:    CRITICAL CARE Performed by: Glynn Octave Total critical care time: 35 minutes Critical care time was exclusive of separately billable procedures and treating other patients. Critical care was necessary to treat or prevent imminent or life-threatening deterioration. Critical care was time spent personally by me on the following activities: development of treatment plan with patient and/or surrogate as well as nursing, discussions with consultants, evaluation of patient's response to treatment, examination of patient, obtaining history from patient or surrogate, ordering and performing treatments and interventions, ordering and review of laboratory studies, ordering and review of radiographic studies, pulse oximetry and re-evaluation of patient's condition.   Glynn Octave, MD 08/16/15 [phone]

## 2015-08-16 ENCOUNTER — Encounter (HOSPITAL_COMMUNITY): Payer: Self-pay | Admitting: Internal Medicine

## 2015-08-16 ENCOUNTER — Encounter (HOSPITAL_COMMUNITY)
Admission: EM | Disposition: A | Discharge status: Skilled Nursing Facility | Payer: Self-pay | Source: Home / Self Care | Attending: Cardiovascular Disease

## 2015-08-16 ENCOUNTER — Inpatient Hospital Stay (HOSPITAL_COMMUNITY): Payer: Medicare Other

## 2015-08-16 DIAGNOSIS — R001 Bradycardia, unspecified: Secondary | ICD-10-CM

## 2015-08-16 DIAGNOSIS — I442 Atrioventricular block, complete: Secondary | ICD-10-CM

## 2015-08-16 HISTORY — DX: Bradycardia, unspecified: R00.1

## 2015-08-16 HISTORY — PX: EP IMPLANTABLE DEVICE: SHX172B

## 2015-08-16 LAB — CBC
HEMATOCRIT: 35.7 % — AB (ref 39.0–52.0)
Hemoglobin: 12 g/dL — ABNORMAL LOW (ref 13.0–17.0)
MCH: 29.9 pg (ref 26.0–34.0)
MCHC: 33.6 g/dL (ref 30.0–36.0)
MCV: 88.8 fL (ref 78.0–100.0)
Platelets: 148 10*3/uL — ABNORMAL LOW (ref 150–400)
RBC: 4.02 MIL/uL — ABNORMAL LOW (ref 4.22–5.81)
RDW: 13.2 % (ref 11.5–15.5)
WBC: 5.3 10*3/uL (ref 4.0–10.5)

## 2015-08-16 LAB — SURGICAL PCR SCREEN
MRSA, PCR: NEGATIVE
Staphylococcus aureus: NEGATIVE

## 2015-08-16 LAB — BASIC METABOLIC PANEL
ANION GAP: 10 (ref 5–15)
BUN: 19 mg/dL (ref 6–20)
CALCIUM: 8.8 mg/dL — AB (ref 8.9–10.3)
CO2: 24 mmol/L (ref 22–32)
Chloride: 107 mmol/L (ref 101–111)
Creatinine, Ser: 1.25 mg/dL — ABNORMAL HIGH (ref 0.61–1.24)
GFR calc Af Amer: 59 mL/min — ABNORMAL LOW (ref >60)
GFR, EST NON AFRICAN AMERICAN: 51 mL/min — AB (ref >60)
GLUCOSE: 88 mg/dL (ref 65–99)
POTASSIUM: 3.5 mmol/L (ref 3.5–5.1)
SODIUM: 141 mmol/L (ref 135–145)

## 2015-08-16 LAB — CREATININE, SERUM
Creatinine, Ser: 1.31 mg/dL — ABNORMAL HIGH (ref 0.61–1.24)
GFR calc Af Amer: 56 mL/min — ABNORMAL LOW (ref >60)
GFR, EST NON AFRICAN AMERICAN: 48 mL/min — AB (ref >60)

## 2015-08-16 SURGERY — PACEMAKER IMPLANT
Anesthesia: LOCAL

## 2015-08-16 MED ORDER — ACETAMINOPHEN 325 MG PO TABS
325.0000 mg | ORAL_TABLET | ORAL | Status: DC | PRN
  Administered 2015-08-17 – 2015-08-18 (×3): 650 mg via ORAL
  Filled 2015-08-16 (×3): qty 2

## 2015-08-16 MED ORDER — MIDAZOLAM HCL 5 MG/5ML IJ SOLN
INTRAMUSCULAR | Status: DC | PRN
  Administered 2015-08-16 (×2): 1 mg via INTRAVENOUS

## 2015-08-16 MED ORDER — MIDAZOLAM HCL 5 MG/5ML IJ SOLN
INTRAMUSCULAR | Status: AC
  Filled 2015-08-16: qty 5

## 2015-08-16 MED ORDER — FENTANYL CITRATE (PF) 100 MCG/2ML IJ SOLN
INTRAMUSCULAR | Status: DC | PRN
  Administered 2015-08-16: 25 ug via INTRAVENOUS

## 2015-08-16 MED ORDER — HEPARIN (PORCINE) IN NACL 2-0.9 UNIT/ML-% IJ SOLN
INTRAMUSCULAR | Status: DC | PRN
  Administered 2015-08-16: 500 mL

## 2015-08-16 MED ORDER — ONDANSETRON HCL 4 MG/2ML IJ SOLN: 4.0000 mg | Freq: Four times a day (QID) | INTRAMUSCULAR | Status: DC | PRN

## 2015-08-16 MED ORDER — LIDOCAINE HCL (PF) 1 % IJ SOLN
INTRAMUSCULAR | Status: DC | PRN
  Administered 2015-08-16: 54 mL

## 2015-08-16 MED ORDER — CEFAZOLIN SODIUM 1-5 GM-% IV SOLN
1.0000 g | Freq: Four times a day (QID) | INTRAVENOUS | Status: AC
  Administered 2015-08-16 – 2015-08-17 (×3): 1 g via INTRAVENOUS
  Filled 2015-08-16 (×3): qty 50

## 2015-08-16 MED ORDER — LIDOCAINE HCL (PF) 1 % IJ SOLN
INTRAMUSCULAR | Status: AC
  Filled 2015-08-16: qty 30

## 2015-08-16 MED ORDER — HEPARIN (PORCINE) IN NACL 2-0.9 UNIT/ML-% IJ SOLN
INTRAMUSCULAR | Status: AC
  Filled 2015-08-16: qty 500

## 2015-08-16 MED ORDER — SODIUM CHLORIDE 0.9 % IR SOLN
Status: DC | PRN
  Administered 2015-08-16: 13:00:00

## 2015-08-16 MED ORDER — HEPARIN SODIUM (PORCINE) 5000 UNIT/ML IJ SOLN
5000.0000 [IU] | Freq: Three times a day (TID) | INTRAMUSCULAR | Status: DC
Start: 2015-08-16 — End: 2015-08-17
  Administered 2015-08-16 – 2015-08-17 (×4): 5000 [IU] via SUBCUTANEOUS
  Filled 2015-08-16 (×4): qty 1

## 2015-08-16 MED ORDER — FENTANYL CITRATE (PF) 100 MCG/2ML IJ SOLN
INTRAMUSCULAR | Status: AC
  Filled 2015-08-16: qty 2

## 2015-08-16 MED ORDER — GENTAMICIN SULFATE 40 MG/ML IJ SOLN
INTRAMUSCULAR | Status: AC
  Filled 2015-08-16: qty 2

## 2015-08-16 MED ORDER — CEFAZOLIN SODIUM-DEXTROSE 2-3 GM-% IV SOLR
INTRAVENOUS | Status: AC
  Filled 2015-08-16: qty 50

## 2015-08-16 SURGICAL SUPPLY — 9 items
CABLE SURGICAL S-101-97-12 (CABLE) ×1 IMPLANT
HOVERMATT SINGLE USE (MISCELLANEOUS) ×1 IMPLANT
INGEVITY MRI 7742-59CM (Lead) ×2 IMPLANT
LEAD PACING INGEVITY MRI 59CM (Lead) IMPLANT
PACEMAKER ESSENTIO SR L100 (Pacemaker) IMPLANT
PAD DEFIB LIFELINK (PAD) ×1 IMPLANT
PPM ESSENTIO SR L100 (Pacemaker) ×2 IMPLANT
SHEATH CLASSIC 7F (SHEATH) ×1 IMPLANT
TRAY PACEMAKER INSERTION (PACKS) ×1 IMPLANT

## 2015-08-16 NOTE — Consult Note (Signed)
ELECTROPHYSIOLOGY CONSULT NOTE    Patient ID: Harold Jones MRN: [id number], DOB/AGE: Jun 12, 1928 82 y.o.  Admit date: 08/15/2015 Date of Consult: 08/16/2015   Primary Physician: Julieanne Manson, MD Primary Cardiologist: Dr. Ladona Ridgel  Reason for Consultation: bradycardia  HPI: Harold Jones is a 82 y.o. male with PMHx of permanent AFib with SVR, historically with significant bradycardia in 2015 that improved with stopping his BB and no PPM was felt indicated at that time, he has PMHx of Bioprosthetic AVR, CAD, CVA, HTN, DM, and  HLD.  He has been out patient on Xarelto. Notes report he has MRI evidence of multiple bilateral remote cerebellar infarcts as well as midbrain lacunar infarcts, but does not have any neurological sequelaeCHADSVasc 6 (age 39, CVA 2, HTN, DM).   The patient comes with recent development of more DOE and generalized weakness and fatigue as well as dizzy spells but no reports of syncope.  He is from [place], does not speak English, the patient is seen by Dr. Ladona Ridgel with the aid of a hospital  Interpreter, and also communicated with the patient's Granddaughter who reported the patient's daughter will be coming is as well.  Past Medical History  Diagnosis Date  . Hypertension   . Diabetes mellitus   . FIBRILLATION, ATRIAL 08/31/2002    Qualifier: Diagnosis of  By: Barbaraann Barthel MD, [place]    . CVA 02/28/2004    Annotation: embolic Qualifier: Diagnosis of  By: Barbaraann Barthel MD, [place]    . COGNITIVE DEFICITS DUE CEREBROVASCULAR DISEASE 09/09/2009    Qualifier: Diagnosis of  By: Delrae Alfred MD, Lanora Manis    . CORONARY ARTERY DISEASE 02/27/2007    Qualifier: Diagnosis of  By: Barbaraann Barthel MD, [place]    . DYSLIPIDEMIA 02/27/2007    Qualifier: Diagnosis of  By: Barbaraann Barthel MD, [place]    . Bradycardia   . Aortic valve disorder      Surgical History:  Past Surgical History  Procedure Laterality Date  . Cardiac surgery       Prescriptions prior to admission  Medication Sig  Dispense Refill Last Dose  . furosemide (LASIX) 20 MG tablet Take 20 mg by mouth daily.   Taking  . gabapentin (NEURONTIN) 300 MG capsule Take 300 mg by mouth 2 (two) times daily.   Taking  . glipiZIDE (GLUCOTROL) 5 MG tablet Take 5 mg by mouth daily before breakfast.    Taking  . lisinopril (PRINIVIL,ZESTRIL) 20 MG tablet Take 20 mg by mouth daily.   Taking  . pravastatin (PRAVACHOL) 40 MG tablet Take 40 mg by mouth daily.   Taking  . Rivaroxaban (XARELTO) 15 MG TABS tablet Take 15 mg by mouth daily.   Taking  . tamsulosin (FLOMAX) 0.4 MG CAPS capsule Take 0.4 mg by mouth daily after supper. 30 minutes after the same meal every day.  3 Taking    Inpatient Medications:  .  ceFAZolin (ANCEF) IV  2 g Intravenous On Call  . furosemide  20 mg Oral Daily  . gabapentin  300 mg Oral BID  . gentamicin irrigation  80 mg Irrigation On Call  . glipiZIDE  5 mg Oral QAC breakfast  . lisinopril  20 mg Oral Daily  . pravastatin  40 mg Oral QHS  . sodium chloride flush  3 mL Intravenous Q12H  . tamsulosin  0.4 mg Oral QPC supper    Allergies: No Known Allergies  Social History   Social History  . Marital Status: Married    Spouse Name: N/A  . Number of  Children: N/A  . Years of Education: N/A   Occupational History  . Not on file.   Social History Main Topics  . Smoking status: Never Smoker   . Smokeless tobacco: Never Used  . Alcohol Use: No  . Drug Use: No  . Sexual Activity: Not on file   Other Topics Concern  . Not on file   Social History Narrative     No family history on file.   Review of Systems: All other systems reviewed and are otherwise negative except as noted above.  Physical Exam: Filed Vitals:   08/15/15 2045 08/15/15 2145 08/15/15 2153 08/16/15 0519  BP: 183/94  148/87 161/71  Pulse: 52  53 46  Temp:    97.6 F (36.4 C)  TempSrc:    Oral  Resp: Weight:    189 lb 13.1 oz (86.1 kg)  SpO2: 94% 94% 100% 100%    GEN- The patient is well  appearing, elderly man, alert and oriented x 3 today.   HEENT: normocephalic, atraumatic; sclera clear, conjunctiva pink; hearing intact; oropharynx clear; neck supple, no JVP Lymph- no cervical lymphadenopathy Lungs- Clear to ausculation bilaterally, normal work of breathing.  No wheezes, rales, rhonchi Heart- IRegular brady rhythm, no murmurs, rubs or gallops, PMI not laterally displaced GI- soft, non-tender, non-distended, bowel sounds present Extremities- no clubbing, cyanosis, or edema; DP/PT/radial pulses 2+ bilaterally MS- no significant deformity or atrophy Skin- warm and dry, no rash or lesion Psych- euthymic mood, full affect Neuro- no gross deficits observed  Labs:   Lab Results  Component Value Date   WBC 4.7 08/15/2015   HGB 13.6 08/15/2015   HCT 40.0 08/15/2015   MCV 87.4 08/15/2015   PLT 159 08/15/2015    Recent Labs Lab 08/16/15 0258  NA 141  K 3.5  CL 107  CO2 24  BUN 19  CREATININE 1.25*  CALCIUM 8.8*  GLUCOSE 88      Radiology/Studies:  Ct Head Wo Contrast 08/16/2015  CLINICAL DATA:  Altered mental status and weakness for 2 days. History of stroke, cognitive defects, hypertension, diabetes. EXAM: CT HEAD WITHOUT CONTRAST TECHNIQUE: Contiguous axial images were obtained from the base of the skull through the vertex without intravenous contrast. COMPARISON:  CT head 03/01/2015.  MRI brain 11/13/2013. FINDINGS: Diffuse cerebral atrophy. Mild ventricular dilatation consistent with central atrophy. Low-attenuation changes throughout the deep white matter consistent with small vessel ischemia. Old cerebellar infarcts. More prominent low-attenuation change in the deep white matter of the right centrum semiovale may represent old infarct. Similar appearance to previous study. No mass effect or midline shift. No abnormal extra-axial fluid collections. Gray-white matter junctions are distinct. Basal cisterns are not effaced. No evidence of acute intracranial hemorrhage.  No depressed skull fractures. Visualized paranasal sinuses and mastoid air cells are not opacified. IMPRESSION: No acute intracranial abnormalities. Chronic atrophy and small vessel ischemic changes. Old infarcts in the right centrum semiovale and cerebellum. Electronically Signed   By: Burman Nieves M.D.   On: 08/16/2015 03:53   Dg Chest Portable 1 View 08/15/2015  CLINICAL DATA:  Weakness for 2 days, RIGHT lower chest pain, bradycardia, diabetes mellitus, hypertension, atrial fibrillation, coronary artery disease EXAM: PORTABLE CHEST 1 VIEW COMPARISON:  Portable exam 1714 hours compared 03/01/2015 FINDINGS: External pacing leads in EKG leads project over chest. Enlargement of cardiac silhouette post AVR. Atherosclerotic calcification and mild elongation of thoracic aorta. Mediastinal contours and pulmonary vascularity otherwise normal. Lungs clear. No pleural effusion  or pneumothorax. Bones demineralized. IMPRESSION: Minimal enlargement of cardiac silhouette post AVR. No acute abnormalities. Electronically Signed   By: Ulyses Southward M.D.   On: 08/15/2015 17:26   11/13/13: Echocardiogram Study Conclusions - Left ventricle: The cavity size was mildly dilated. Systolic function was vigorous. The estimated ejection fraction was in the range of 65% to 70%. Wall motion was normal; there were no regional wall motion abnormalities. - Aortic valve: Valve area: 1.09cm^2 (Vmax). - Mitral valve: Calcified annulus. Mildly thickened leaflets - Left atrium: The atrium was moderately dilated. - Right ventricle: The cavity size was mildly dilated. Wall thickness was normal. Systolic function was normal. - Pulmonary arteries: PA peak pressure: 40mm Hg (S). - Pericardium, extracardiac: There was no pericardial effusion. - Impressions: Stable mild paravalvular leak. Mildly elevated transaortic gradients, but stable when compared to the prior study from 03/2012. Impressions: - Stable mild  paravalvular leak. Mildly elevated transaortic gradients, but stable when compared to the prior study from 03/2012.  EKG: Afib 40bpm, no ischemic looking changes  TELEMETRY: Afib, 30's-40's currently, reportedly transiently 20's   Assessment and Plan:   1. Symptomatic bradycardia     Not on any nodal blocking agents     TSH wnl  2. Permanent Afib     On Xarelto at home     Will resume post PPM  3.  Hx of bioprosthetic AVR      Echo is pending  4. HTN    Signed, Maryella Shivers 08/16/2015 10:29 AM  EP Attending  Patient seen and examined. Agree with the above. He has symptomatic bradycardia and I have spoken to the patient through the [place] interpreter and explained the indication, risk and benefit of PPM insertion and he is willing to proceed. I have spoken to his grandaughter and await the arrival of his daughter. Will plan to proceed with PPM insertion.   Leonia Reeves.D.

## 2015-08-16 NOTE — Progress Notes (Signed)
Patient lying in bed, spoke with daughter, no pain, distress or needs expressed at this time. Call light within reach.

## 2015-08-16 NOTE — Discharge Summary (Addendum)
ELECTROPHYSIOLOGY PROCEDURE DISCHARGE SUMMARY    Patient ID: Harold Jones,  MRN: [id number], DOB/AGE: 02/09/1928 82 y.o.  Admit date: 08/15/2015 Discharge date: 08/17/2015  Primary Care Physician: Julieanne Manson, MD Primary Cardiologist/Electrophysiologist: Dr. Ladona Ridgel  Primary Discharge Diagnosis:  Symptomatic bradycardia status post pacemaker implantation this admission  Secondary Discharge Diagnosis:  1. Permanent Afib      CHADS2 Vasc is at least 6 on Xarelto 2. CAD 3. VHD/bioprosthetic AVR 4. DM 5. HTN   No Known Allergies   Procedures This Admission:  1.  Implantation of a [employer] single chamber PPM on 08/16/15 by Dr Ladona Ridgel.  The patient received a [employer] (serial number [id number]) PPM with[employer] (serial number [id number]) right ventricular lead. There were no immediate post procedure complications. 2.  CXR on 08/17/15 demonstrated no pneumothorax status post device implantation.   Brief HPI: Harold Jones is a 82 y.o. male was admitted to [facility] 08/15/15 with c/o weakness, dizziness, DOE, no syncope, found with bradycardia with rates 30's, transiently 20's underwent PPM implantation.  Hospital Course:  The patient was admitted by cardiology service, he has hx of AVR with echo May 2015 normal LVEF and his AVR stable when compared to previous echo, it was felt his AVR was not contributing to his symptoms given his significant bradycardia.  He was monitored on telemetry closely undergoing PPM implant the following day by Dr. Ladona Ridgel with details as outlined above.  To note, the patient does not speak English, communication was aided by hospital system [place], and communication was kept with the family mainly via his grand children on file.  He was monitored on telemetry overnight which demonstrated AFib, generally in the 50's, intermittent pacing.  Left chest was without hematoma or ecchymosis.  The device was interrogated and found to be  functioning normally.  CXR was obtained and demonstrated no pneumothorax status post device implantation.  Wound care, arm mobility, and restrictions were reviewed with the patient.  The patient was examined by Dr. Ladona Ridgel and considered stable for discharge to home. Xarelto to be resumed today. His home dose of Xarelto is renal dose,  daily, his Calc Cr. Cl here is borderline 49-51, will continue him on his established home dose given his advanced age.   Physical Exam: Filed Vitals:   08/17/15 1353 08/17/15 2036 08/18/15 0347 08/18/15 1434  BP: 168/67 152/63 176/59 175/83  Pulse: 50 57 50 50  Temp: 97.8 F (36.6 C) 98.9 F (37.2 C) 98 F (36.7 C) 97.6 F (36.4 C)  TempSrc: Oral Oral Oral Oral  Resp: Weight:   189 lb 13.1 oz (86.1 kg)   SpO2: 99% 96% 96% 98%    GEN- The patient is elderly, well appearing, alert and oriented x 3 today.   HEENT: normocephalic, atraumatic; sclera clear, conjunctiva pink; hearing intact; oropharynx clear; neck supple, no JVP Lungs- Clear to ausculation bilaterally, normal work of breathing.  No wheezes, rales, rhonchi Heart- Regular rate and rhythm (paced), no significant murmurs, rubs or gallops, PMI not laterally displaced GI- soft, non-tender, non-distended Extremities- no clubbing, cyanosis, or edema MS- no significant deformity or atrophy Skin- warm and dry, no rash or lesion, left chest without hematoma/ecchymosis Psych- euthymic mood, full affect Neuro- no gross deficits   Labs:   Lab Results  Component Value Date   WBC 5.3 08/16/2015   HGB 12.0* 08/16/2015   HCT 35.7* 08/16/2015   MCV 88.8 08/16/2015   PLT 148* 08/16/2015  Recent Labs Lab 08/18/15 0420  NA 139  K 3.9  CL 107  CO2 23  BUN 18  CREATININE 1.22  CALCIUM 9.3  GLUCOSE 153*    Discharge Medications:    Medication List    TAKE these medications        furosemide 20 MG tablet  Commonly known as:  LASIX  Take 20 mg by mouth daily. Reported  on 08/17/2015     gabapentin 300 MG capsule  Commonly known as:  NEURONTIN  Take 300 mg by mouth 2 (two) times daily.     glipiZIDE 5 MG tablet  Commonly known as:  GLUCOTROL  Take 5 mg by mouth daily before breakfast.     lisinopril 20 MG tablet  Commonly known as:  PRINIVIL,ZESTRIL  Take 20 mg by mouth daily.     pravastatin 40 MG tablet  Commonly known as:  PRAVACHOL  Take 40 mg by mouth daily.     Rivaroxaban 15 MG Tabs tablet  Commonly known as:  XARELTO  Take 15 mg by mouth daily.     tamsulosin 0.4 MG Caps capsule  Commonly known as:  FLOMAX  Take 0.4 mg by mouth daily after supper. 30 minutes after the same meal every day.        Disposition: Home  Follow-up Information    Follow up with [facility] On 08/31/2015.   Specialty:  Cardiology   Why:  11:30AM wound check   Contact information:   [address] [place] [address] [phone]      Follow up with Lewayne Bunting, MD On 11/16/2015.   Specialty:  Cardiology   Why:  10:00AM   Contact information:   [address] [place] [address] [phone]       Duration of Discharge Encounter: Greater than 30 minutes including physician time.  Norma Fredrickson, PA-C 08/18/2015 3:45 PM    EP Attending  Patient seen and examined. Agree with the findings as noted above. The patient is doing well, s/p PPM insertion. His device is working normally and his CXR is good. He is stable for discharge from a cardiac perspective. We will arrange PPM followup in our office.  Gregg Taylor,M.D.  08/18/15:  ADDENDUM: The patient's discharge was held secondary to PT evaluation and home health needs, is pending SNF placement after PT evaluation and case management discussion with family, with plans for social services to find SNF placement.  The patient is to be discharged when bed available.  Please refer to today's progress note for exam findings and  noted information as well.  Francis Dowse, PA-C  Lewayne Bunting, M.D.

## 2015-08-16 NOTE — Progress Notes (Signed)
Spoke with grandaughter on phone, updated on plan for PPM today and where patient is in hospital. Translated to remind pt he is not able to eat due to having pacemaker today. Sips of water can be given. Pt understands and remembers. G-daughter states that her mother (pts daughter) will be coming to see him within an hour. Pt has no further questions at this time.

## 2015-08-16 NOTE — Progress Notes (Signed)
Patient returned from the EP lab. Called family they are aware patient returned.   Ashby Moskal, Charlaine Dalton RN

## 2015-08-16 NOTE — Discharge Instructions (Signed)
° ° °  Supplemental Discharge Instructions for  °Pacemaker/Defibrillator Patients ° °Activity °No heavy lifting or vigorous activity with your left/right arm for 6 to 8 weeks.  Do not raise your left/right arm above your head for one week.  Gradually raise your affected arm as drawn below. ° °        °   08/21/15                    08/22/15                     08/23/15                  08/24/15 °__ ° °NO DRIVING for 1 week     ; you may begin driving on  08/24/15   . ° °WOUND CARE °- Keep the wound area clean and dry.  Do not get this area wet for one week. No showers for one week; you may shower on  08/24/15   . °- The tape/steri-strips on your wound will fall off; do not pull them off.  No bandage is needed on the site.  DO  NOT apply any creams, oils, or ointments to the wound area. °- If you notice any drainage or discharge from the wound, any swelling or bruising at the site, or you develop a fever > 101? F after you are discharged home, call the office at once. ° °Special Instructions °- You are still able to use cellular telephones; use the ear opposite the side where you have your pacemaker/defibrillator.  Avoid carrying your cellular phone near your device. °- When traveling through airports, show security personnel your identification card to avoid being screened in the metal detectors.  Ask the security personnel to use the hand wand. °- Avoid arc welding equipment, MRI testing (magnetic resonance imaging), TENS units (transcutaneous nerve stimulators).  Call the office for questions about other devices. °- Avoid electrical appliances that are in poor condition or are not properly grounded. °- Microwave ovens are safe to be near or to operate. ° °Additional information for defibrillator patients should your device go off: °- If your device goes off ONCE and you feel fine afterward, notify the device clinic nurses. °- If your device goes off ONCE and you do not feel well afterward, call 911. °- If your device  goes off TWICE, call 911. °- If your device goes off THREE times in one day, call 911. ° °DO NOT DRIVE YOURSELF OR A FAMILY MEMBER °WITH A DEFIBRILLATOR TO THE HOSPITAL--CALL 911. °

## 2015-08-16 NOTE — Progress Notes (Signed)
Utilization review completed. Shawn Carattini, RN, BSN. 

## 2015-08-17 ENCOUNTER — Inpatient Hospital Stay (HOSPITAL_COMMUNITY): Payer: Medicare Other

## 2015-08-17 LAB — BASIC METABOLIC PANEL
ANION GAP: 10 (ref 5–15)
BUN: 17 mg/dL (ref 6–20)
CO2: 21 mmol/L — ABNORMAL LOW (ref 22–32)
Calcium: 8.7 mg/dL — ABNORMAL LOW (ref 8.9–10.3)
Chloride: 107 mmol/L (ref 101–111)
Creatinine, Ser: 1.25 mg/dL — ABNORMAL HIGH (ref 0.61–1.24)
GFR, EST AFRICAN AMERICAN: 59 mL/min — AB (ref >60)
GFR, EST NON AFRICAN AMERICAN: 51 mL/min — AB (ref >60)
Glucose, Bld: 153 mg/dL — ABNORMAL HIGH (ref 65–99)
POTASSIUM: 3.6 mmol/L (ref 3.5–5.1)
SODIUM: 138 mmol/L (ref 135–145)

## 2015-08-17 MED ORDER — RIVAROXABAN 15 MG PO TABS
15.0000 mg | ORAL_TABLET | Freq: Every day | ORAL | Status: DC
  Administered 2015-08-17: 15 mg via ORAL
  Filled 2015-08-17: qty 1

## 2015-08-18 ENCOUNTER — Encounter (HOSPITAL_COMMUNITY): Payer: Self-pay | Admitting: Physician Assistant

## 2015-08-18 LAB — BASIC METABOLIC PANEL
ANION GAP: 9 (ref 5–15)
BUN: 18 mg/dL (ref 6–20)
CO2: 23 mmol/L (ref 22–32)
Calcium: 9.3 mg/dL (ref 8.9–10.3)
Chloride: 107 mmol/L (ref 101–111)
Creatinine, Ser: 1.22 mg/dL (ref 0.61–1.24)
GFR, EST NON AFRICAN AMERICAN: 52 mL/min — AB (ref >60)
Glucose, Bld: 153 mg/dL — ABNORMAL HIGH (ref 65–99)
POTASSIUM: 3.9 mmol/L (ref 3.5–5.1)
SODIUM: 139 mmol/L (ref 135–145)

## 2015-08-18 NOTE — Care Management Important Message (Signed)
Important Message  Patient Details  Name: Harold Jones MRN: [id number] Date of Birth: Aug 14, 1927   Medicare Important Message Given:  Yes    Kyla Balzarine 08/18/2015, 12:28 PM

## 2015-08-18 NOTE — Evaluation (Signed)
Physical Therapy Evaluation Patient Details Name: Harold Jones MRN: [id number] DOB: 07-14-1927 Today's Date: 08/18/2015   History of Present Illness  This is a 82 y.o. male with a past medical history significant for long-standing permanent atrial fibrillation with slow ventricular response, now presenting with symptomatic bradycardia.  He has MRI evidence of multiple bilateral remote cerebellar infarcts as well as midbrain lacunar infarcts, but does not have any neurological.  Now s/p PPM.  Clinical Impression  Patient presents with decreased mobility due to deficits listed in PT problem list.  He will benefit from skilled PT in the acute setting to allow return home with family support and HHPT.  Limited communication even with phone interpreter, but daughter not available till later in the day and interpreter services without [place] interpreter.      Follow Up Recommendations Home health PT;Supervision/Assistance - 24 hour    Equipment Recommendations  Rolling walker with 5" wheels    Recommendations for Other Services       Precautions / Restrictions Precautions Precautions: Fall;ICD/Pacemaker      Mobility  Bed Mobility Overal bed mobility: Needs Assistance Bed Mobility: Supine to Sit;Sit to Supine     Supine to sit: Mod assist Sit to supine: Mod assist      Transfers Overall transfer level: Needs assistance Equipment used: Rolling walker (2 wheeled) Transfers: Sit to/from [employer] Sit to Stand: Min assist Stand pivot transfers: Min assist       General transfer comment: increased time, cues for hand placement  Ambulation/Gait Ambulation/Gait assistance: Min assist Ambulation Distance (Feet): 90 Feet Assistive device: Rolling walker (2 wheeled) Gait Pattern/deviations: Step-through pattern;Trunk flexed;Antalgic     General Gait Details: c/o L knee pain more in walking, cues for upright posture, but does not stay   Stairs             Wheelchair Mobility    Modified Rankin (Stroke Patients Only)       Balance Overall balance assessment: Needs assistance   Sitting balance-Leahy Scale: Fair     Standing balance support: Bilateral upper extremity supported Standing balance-Leahy Scale: Poor Standing balance comment: UE support for balance                             Pertinent Vitals/Pain Pain Assessment: Faces Faces Pain Scale: Hurts little more Pain Location: L knee and surgical site Pain Descriptors / Indicators: Aching;Tender Pain Intervention(s): Monitored during session;Repositioned    Home Living Family/patient expects to be discharged to:: Private residence Living Arrangements: Children Available Help at Discharge: Available 24 hours/day Type of Home: House Home Access: Stairs to enter Entrance Stairs-Rails: Right Entrance Stairs-Number of Steps: 4-5 Home Layout: One level Home Equipment: Cane - single point      Prior Function Level of Independence: Needs assistance   Gait / Transfers Assistance Needed: reports used cane  ADL's / Homemaking Assistance Needed: daughter assists   Comments: states wife died 15 days ago     Hand Dominance        Extremity/Trunk Assessment   Upper Extremity Assessment: LUE deficits/detail       LUE Deficits / Details: NT due to recent pacemaker, uses functionally without difficulty   Lower Extremity Assessment: Generalized weakness      Cervical / Trunk Assessment: Kyphotic  Communication   Communication: Interpreter utilized;Prefers language other than English ([place])  Cognition Arousal/Alertness: Awake/alert Behavior During Therapy: WFL for tasks assessed/performed (tearful at times) Overall  Cognitive Status: No family/caregiver present to determine baseline cognitive functioning (slow processing)                      General Comments General comments (skin integrity, edema, etc.): swollen over pacemaker  site    Exercises        Assessment/Plan    PT Assessment Patient needs continued PT services  PT Diagnosis Abnormality of gait;Generalized weakness   PT Problem List Decreased strength;Decreased activity tolerance;Decreased balance;Decreased knowledge of use of DME;Decreased knowledge of precautions;Decreased mobility  PT Treatment Interventions DME instruction;Balance training;Gait training;Stair training;Functional mobility training;Patient/family education;Therapeutic activities;Therapeutic exercise   PT Goals (Current goals can be found in the Care Plan section) Acute Rehab PT Goals Patient Stated Goal: To walk more PT Goal Formulation: With patient Time For Goal Achievement: 08/25/15 Potential to Achieve Goals: Good    Frequency Min 3X/week   Barriers to discharge        Co-evaluation               End of Session Equipment Utilized During Treatment: Gait belt Activity Tolerance: Patient limited by fatigue Patient left: in bed;with call bell/phone within reach           Time: 1050-1134 PT Time Calculation (min) (ACUTE ONLY): 44 min   Charges:   PT Evaluation $PT Eval Moderate Complexity: 1 Procedure PT Treatments $Gait Training: 8-22 mins $Therapeutic Activity: 8-22 mins   PT G Codes:        Elray Mcgregor 09/14/2015, 12:37 PM  Sheran Lawless, PT [phone] 14-Sep-2015

## 2015-08-18 NOTE — Clinical Social Work Placement (Signed)
   CLINICAL SOCIAL WORK PLACEMENT  NOTE  Date:  08/18/2015  Patient Details  Name: Harold Jones MRN: [id number] Date of Birth: [birth year]  Clinical Social Work is seeking post-discharge placement for this patient at the Skilled  Nursing Facility level of care (*CSW will initial, date and re-position this form in  chart as items are completed):  Yes   Patient/family provided with [facility] Social Work Department's list of facilities offering this level of care within the geographic area requested by the patient (or if unable, by the patient's family).  Yes   Patient/family informed of their freedom to choose among providers that offer the needed level of care, that participate in Medicare, Medicaid or managed care program needed by the patient, have an available bed and are willing to accept the patient.  Yes   Patient/family informed of [facility]'s ownership interest in [facility] and [facility], as well as of the fact that they are under no obligation to receive care at these facilities.  PASRR submitted to EDS on       PASRR number received on       Existing PASRR number confirmed on 08/18/15     FL2 transmitted to all facilities in geographic area requested by pt/family on 08/18/15     FL2 transmitted to all facilities within larger geographic area on       Patient informed that his/her managed care company has contracts with or will negotiate with certain facilities, including the following:        Yes   Patient/family informed of bed offers received.  Patient chooses bed at [facility]     Physician recommends and patient chooses bed at [facility]    Patient to be transferred to   on 08/18/15.  Patient to be transferred to facility by family     Patient family notified on 08/18/15 of transfer.  Name of family member notified:  Ms. Windell Hummingbird     PHYSICIAN Please sign FL2     Additional Comment:     _______________________________________________ Izora Ribas, LCSW 08/18/2015, 3:59 PM

## 2015-08-18 NOTE — NC FL2 (Signed)
[place] MEDICAID FL2 LEVEL OF CARE SCREENING TOOL     IDENTIFICATION  Patient Name: Harold Jones Birthdate: October 01, 1927 Sex: male Admission Date (Current Location): 08/15/2015  [facility] and [place] Number:  Producer, television/film/video and Address:  [facility], [address], [place], [place] [address]      Provider Number: [id number]  Attending Physician Name and Address:  Thurmon Fair, MD  Relative Name and Phone Number:       Current Level of Care: Hospital Recommended Level of Care: Skilled Nursing Facility Prior Approval Number:    Date Approved/Denied:   PASRR Number: [id number]  Discharge Plan: SNF    Current Diagnoses: Patient Active Problem List   Diagnosis Date Noted  . Symptomatic bradycardia 08/15/2015  . Intraventricular conduction delay 08/15/2015  . S/P aortic valve replacement with bioprosthetic valve 08/15/2015  . Atrial fibrillation with slow ventricular response (HCC) 08/15/2015  . Severe sepsis (HCC) 05/30/2015  . Acute respiratory failure (HCC) 05/30/2015  . History of stroke 03/15/2015  . Benign hypertensive heart disease without heart failure 03/08/2015  . 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 BPH without obstruction/lower urinary tract symptoms 03/08/2015  . Post herpetic neuralgia 03/08/2015  . CAP (community acquired pneumonia) 03/01/2015  . Syncope and collapse 11/12/2013  . Nasal fracture 11/12/2013  . Encephalopathy acute 06/23/2013  . Diabetes mellitus, type 2 (HCC) 06/23/2013  . Hypoglycemia 06/23/2013  . Supratherapeutic INR 06/23/2013  . Zoster 10/12/2011  . Altered mental status 10/08/2011  . GLAUCOMA 09/08/2010  . KNEE PAIN, LEFT 11/22/2009  . COGNITIVE DEFICITS DUE CEREBROVASCULAR DISEASE 09/09/2009  . CAROTID BRUIT, RIGHT 01/18/2009  . GASTROINTESTINAL HEMORRHAGE 09/26/2007  . OSTEOARTHRITIS 03/18/2007  . Dyslipidemia 02/27/2007  . CORONARY ARTERY DISEASE  02/27/2007  . Essential hypertension 02/25/2007  . Cerebral artery occlusion with cerebral infarction (HCC) 02/28/2004  . ASCENDING AORTIC ANEURYSM 02/28/2004  . H/O aortic valve replacement 02/28/2004  . FIBRILLATION, ATRIAL 08/31/2002    Orientation RESPIRATION BLADDER Height & Weight     Self, Time, Situation, Place  Normal Incontinent Weight: 189 lb 13.1 oz (86.1 kg) Height:     BEHAVIORAL SYMPTOMS/MOOD NEUROLOGICAL BOWEL NUTRITION STATUS      Continent Diet  AMBULATORY STATUS COMMUNICATION OF NEEDS Skin   Limited Assist Verbally Surgical wounds                       Personal Care Assistance Level of Assistance  Bathing, Dressing Bathing Assistance: Limited assistance   Dressing Assistance: Limited assistance     Functional Limitations Info             SPECIAL CARE FACTORS FREQUENCY  PT (By licensed PT), OT (By licensed OT)     PT Frequency: 5/wk OT Frequency: 5/wk            Contractures      Additional Factors Info  Code Status, Allergies Code Status Info: FULL Allergies Info: NKA           Current Medications (08/18/2015):  This is the current hospital active medication list Current Facility-Administered Medications  Medication Dose Route Frequency Provider Last Rate Last Dose  . acetaminophen (TYLENOL) tablet 325-650 mg  325-650 mg Oral Q4H PRN Marinus Maw, MD   650 mg at 08/18/15 0357  . furosemide (LASIX) tablet 20 mg  20 mg Oral Daily Mihai Croitoru, MD   20 mg at 08/18/15 0823  . gabapentin (NEURONTIN) capsule 300  mg  300 mgThurmon Fairihai Croitoru, MD   300 mg at 08/18/15 0823  . glipiZIDE (GLUCOTROL) tablet 5 mg  5 mg Oral QAC breakfast Thurmon Fair, MD   5 mg at 08/18/15 0631  . ondansetron (ZOFRAN) injection 4 mg  4 mg Intravenous Q6H PRN Marinus Maw, MD      . pravastatin (PRAVACHOL) tablet 40 mg  40 mg Oral QHS Mihai Croitoru, MD   40 mg at 08/17/15 2313  . Rivaroxaban (XARELTO) tablet 15 mg  15 mg Oral Q supper Sheilah Pigeon, PA-C   15 mg at 08/17/15 1702  . tamsulosin (FLOMAX) capsule 0.4 mg  0.4 mg Oral QPC supper Thurmon Fair, MD   0.4 mg at 08/17/15 1703     Discharge Medications: Please see discharge summary for a list of discharge medications.  Relevant Imaging Results:  Relevant Lab Results:   Additional Information SS#: [id number]  Izora Ribas, [place]

## 2015-08-18 NOTE — Progress Notes (Signed)
@  4:15pm  Patient discharged to SNF  With daughter,  Alert & oriented, IV site discontinued,  Condom cath disc, physician in to remove dressing from pacer site,     [place] ( Child psychotherapist )  Talked with the SNF facility and patient family.  Governor Specking, RN

## 2015-08-18 NOTE — Care Management Note (Signed)
Case Management Note Harold Pierini RN, BSN Unit 2W-Case Manager [phone]  Patient Details  Name: Harold Jones MRN: [id number] Date of Birth: [birth year]  Subjective/Objective:  Pt admitted with bradycardia- s/p PPM                  Action/Plan: PTA pt lived at home alone- wife recently passed away 10 days ago- per PT recommendations pt would be most safe with 24 hr supervision- recommendation for [facility]- discussed with pt's bedside RN and Renee PA with EP- call made to pt's granddaughterAmmie Jones [phone])- discussed options for 24 hr supervision- per granddaughter everyone in family works and there is no other friends or family available to assist or for pt to stay with- per granddaughter pt would be alone at home with no assistance- pt has been to [facility] in past and has had HH with [facility] in the past - granddaughter states that family feels [facility] would be best and most safe option if insurance would cover but is open for [facility] if [facility] not an option. Call placed to CSW - and she will speak with pt and granddaughter and work up [facility] [employer] option- 1600- update- CSW has found pt a SNF bed at [facility]- pt and family agreeable- pt will d/c to SNF today. Updated PA- Harold Jones   Expected Discharge Date:    08/18/15              Expected Discharge Plan:  Skilled Nursing Facility  In-House Referral:  Clinical Social Work  Discharge planning Services  CM Consult  Post Acute Care Choice:    Choice offered to:     DME Arranged:    DME Agency:     HH Arranged:    HH Agency:     Status of Service:  Completed, signed off  Medicare Important Message Given:  Yes Date Medicare IM Given:    Medicare IM give by:    Date Additional Medicare IM Given:    Additional Medicare Important Message give by:     If discussed at Long Length of Stay Meetings, dates discussed:    Discharge Disposition: Skilled facility   Additional Comments:  Darrold Span, RN 08/18/2015, 4:02 PM

## 2015-08-18 NOTE — Progress Notes (Signed)
SUBJECTIVE: The patient is doing better today.  His discharge was held yesterday due to family and patient concerns about his ability to be home with his recent functional decline. PT evaluation was requested and unable to be done until today.  The patient is seen again today with the aid of the hospital translation telephone service, with a [place] interpretor.  The patient tells me he has some pain at his PPM implant site but declines pain medication.  He states he feels better today and defers the decision to going home to his granddaughter and daughter. Says he feels much better then had was before coming in and even from yesterday.  He was seen today by PT service, the patient states it felt good to be out of bed and felt good ambulating in the hallway with her, states he wished he could have walked longer and enjoyed the walk.  He denies any CP, palpitations or SOB.      . furosemide  20 mg Oral Daily  . gabapentin  300 mg Oral BID  . glipiZIDE  5 mg Oral QAC breakfast  . pravastatin  40 mg Oral QHS  . rivaroxaban  15 mg Oral Q supper  . tamsulosin  0.4 mg Oral QPC supper      OBJECTIVE: Physical Exam: Filed Vitals:   08/17/15 1353 08/17/15 2036 08/18/15 0347 08/18/15 1434  BP: 168/67 152/63 176/59 175/83  Pulse: 50 57 50 50  Temp: 97.8 F (36.6 C) 98.9 F (37.2 C) 98 F (36.7 C) 97.6 F (36.4 C)  TempSrc: Oral Oral Oral Oral  Resp: Weight:   189 lb 13.1 oz (86.1 kg)   SpO2: 99% 96% 96% 98%    Intake/Output Summary (Last 24 hours) at 08/18/15 1447 Last data filed at 08/18/15 1238  Gross per 24 hour  Intake    600 ml  Output   1725 ml  Net  -1125 ml    Telemetry reveals V pacing at 50bpm  GEN- The patient is elderly, appears in NAD, alert and oriented x 3 today.   Head- normocephalic, atraumatic Eyes-  Sclera clear, conjunctiva pink Ears- hearing intact Oropharynx- clear Neck- supple, no JVP Lungs- Clear to ausculation bilaterally, normal work of  breathing Heart- Regular rate and rhythm (paced), no significant murmurs, no rubs or gallops GI- soft, NT, ND Extremities- no clubbing, cyanosis, or edema Skin- no rash or lesion Psych- euthymic mood, full affect Neuro- no gross deficits appreciated  PPM site has small hematoma, exterior dressing is removed, stri-strips are dry, minimal tenderness, ecchymosis   LABS: Basic Metabolic Panel:  Recent Labs  56/21/30 0614 08/18/15 0420  NA 138 139  K 3.6 3.9  CL 107 107  CO2 21* 23  GLUCOSE 153* 153*  BUN 17 18  CREATININE 1.25* 1.22  CALCIUM 8.7* 9.3   CBC:  Recent Labs  08/15/15 1705 08/15/15 1714 08/16/15 1606  WBC 4.7  --  5.3  NEUTROABS 2.3  --   --   HGB 12.4* 13.6 12.0*  HCT 37.4* 40.0 35.7*  MCV 87.4  --  88.8  PLT 159  --  148*   Cardiac Enzymes:  Recent Labs  08/15/15 1705  TROPONINI <0.03   Thyroid Function Tests:  Recent Labs  08/15/15 1756  TSH 0.357   RADIOLOGY: Dg Chest 2 View 08/17/2015  CLINICAL DATA:  Status post pacemaker placement. EXAM: CHEST  2 VIEW COMPARISON:  August 15, 2015. FINDINGS: Stable cardiomediastinal  silhouette. Interval placement of left-sided pacemaker with single lead in grossly good position. Sternotomy wires are noted. No pneumothorax or pleural effusion is noted. Status post aortic valve replacement. No acute pulmonary disease is noted. Bony thorax is unremarkable. IMPRESSION: Interval placement of left-sided pacemaker with single lead in grossly good position. No pneumothorax is noted. Electronically Signed   By: Lupita Raider, M.D.   On: 08/17/2015 08:02   ASSESSMENT AND PLAN:   1. Symptomatic bradycardia     S/p PPM 08/16/15     Device check yesterday Discharge held yesterday secondary to pending PT eval for home needs.     HR is stable, paced     BP is stable, he has intermittent higher readings, his home lisinopril will be resumed     Labs stable     Small hematoma at the PPM site, evaluated by Dr. Johney Frame,  appears stable, no need for pressure dressing, will continue his Xarelto     The patient is feeling much better today, is more animated and conversational, walked with PT and was happy to be up and about.  2. VHD      Hx of Bioprosthetic AVR      Last echo in May 2015 with stable findings from 2013      To follow out patient  3. Permanent AFib     CHADS2Vasc is at least 6     Evidence of old infarcts on his CT     Has been maintained on Xarelto  4. Functional decline     PT recommends 24 hour home supervision, HH RN     Case management has been in d/w family, they are unable to 24 hour care/support     Social services is arranging SNF placement for rehab.       Francis Dowse, PA-C 08/18/2015 2:47 PM   I have seen, examined the patient, and reviewed the above assessment and plan.  On exam, small pacemaker pocket hematoma.  Does not appear to be actively bleeding.  Changes to above are made where necessary.    Co Sign: Hillis Range, MD

## 2015-08-18 NOTE — Progress Notes (Signed)
CSW informed by [facility] that family will not be available for patient at home during the day or overnight and that supervision would be very limited- family and patient do not feel this is a safe plan.  CSW spoke with granddaughter who is agreeable to placement and states that patient was in [place] in September for rehab and stayed about 2 months- has not had a rehab stay since and has been at home alone.  Granddaughter would prefer placement at [facility] at this time due to proximity to home.  [facility] is able to accept patient today- DC paperwork sent to facility.  Pt daughter and son in law at bedside and will transport patient  Patient will discharge to [facility] Anticipated discharge date: 2/16 Family notified: granddaughter, daughter Transportation by daughter- RN to release when pt is ready  CSW signing off.  Merlyn Lot, LCSWA Clinical Social Worker [phone]

## 2015-08-19 ENCOUNTER — Encounter: Payer: Self-pay | Admitting: Adult Health

## 2015-08-19 ENCOUNTER — Non-Acute Institutional Stay (SKILLED_NURSING_FACILITY): Payer: Medicare Other | Admitting: Adult Health

## 2015-08-19 DIAGNOSIS — I10 Essential (primary) hypertension: Secondary | ICD-10-CM

## 2015-08-19 DIAGNOSIS — E785 Hyperlipidemia, unspecified: Secondary | ICD-10-CM

## 2015-08-19 DIAGNOSIS — R001 Bradycardia, unspecified: Secondary | ICD-10-CM

## 2015-08-19 DIAGNOSIS — E118 Type 2 diabetes mellitus with unspecified complications: Secondary | ICD-10-CM | POA: Diagnosis not present

## 2015-08-19 DIAGNOSIS — B0229 Other postherpetic nervous system involvement: Secondary | ICD-10-CM | POA: Diagnosis not present

## 2015-08-19 DIAGNOSIS — N400 Enlarged prostate without lower urinary tract symptoms: Secondary | ICD-10-CM

## 2015-08-19 DIAGNOSIS — I482 Chronic atrial fibrillation, unspecified: Secondary | ICD-10-CM

## 2015-08-19 NOTE — Progress Notes (Signed)
Patient ID: Harold Jones, male   DOB: 01/12/1928, 82 y.o.   MRN: [id number]    DATE:  08/19/15  MRN:  [id number]  BIRTHDAY: [birth year]  Facility:  Nursing Home Location:  [facility]  Nursing Home Room Number: [phone]  LEVEL OF CARE:  SNF [phone])  Contact Information    Name Relation Home Work Mobile   [place] Grandaughter [phone]     [place] Daughter   [phone]   Lenon Oms [phone]         Code Status History    Date Active Date Inactive Code Status Order ID Comments User Context   03/01/2015 11:24 PM 03/07/2015  9:19 PM Full Code [id number]  Rolly Salter, MD ED   11/12/2013  2:53 PM 11/14/2013  5:48 PM Full Code [id number]  Leroy Sea, MD Inpatient   06/23/2013  6:22 PM 06/25/2013 10:01 PM Full Code [id number]  Edsel Petrin, DO Inpatient   10/08/2011  6:33 PM 10/12/2011  4:30 PM Full Code [id number]  Delcine Maryln Manuel, RN Inpatient       Chief Complaint  Patient presents with  . Hospitalization Follow-up    HISTORY OF PRESENT ILLNESS:  This is an 82 year old male who has been admitted to [facility] on 08/18/15 from [facility]. He has PMH of permanent A. fib on Xarelto with SVR historical daily with significant bradycardia in 2015 that improved with stopping his beta blocker and no PPM was felt indicated at that time. He, also also has PMH of bioprosthetic AVR, CAD, CVA, hypertension, diabetes mellitus and hyperlipidemia. MRI report shows evidence of multiple old bilateral remote cerebellar infarcts as well as midbrain lacunar infarcts but does not have any neurological sequelae. He is from [place] and does not speak [place]. He complained of weakness, dizziness, DOE, no syncope and was found to have bradycardia with rates 30s, transiently 26. PPM was implanted on 08/16/15. He has been admitted for a short-term rehabilitation.   PAST MEDICAL HISTORY:  Past Medical History  Diagnosis Date  . Hypertension   .  Diabetes mellitus   . FIBRILLATION, ATRIAL 08/31/2002    Qualifier: Diagnosis of  By: Barbaraann Barthel MD, [place]    . CVA 02/28/2004    Annotation: embolic Qualifier: Diagnosis of  By: Barbaraann Barthel MD, [place]    . COGNITIVE DEFICITS DUE CEREBROVASCULAR DISEASE 09/09/2009    Qualifier: Diagnosis of  By: Delrae Alfred MD, Lanora Manis    . CORONARY ARTERY DISEASE 02/27/2007    Qualifier: Diagnosis of  By: Barbaraann Barthel MD, [place]    . DYSLIPIDEMIA 02/27/2007    Qualifier: Diagnosis of  By: Barbaraann Barthel MD, [place]    . Symptomatic bradycardia 08/16/15    [employer] PPM, Dr. Ladona Ridgel  . Aortic valve disorder      CURRENT MEDICATIONS: Reviewed  Patient's Medications  New Prescriptions   No medications on file  Previous Medications   FUROSEMIDE (LASIX) 20 MG TABLET    Take 20 mg by mouth daily. Reported on 08/17/2015   GABAPENTIN (NEURONTIN) 300 MG CAPSULE    Take 300 mg by mouth 2 (two) times daily.   GLIPIZIDE (GLUCOTROL) 5 MG TABLET    Take 5 mg by mouth daily before breakfast.    LISINOPRIL (PRINIVIL,ZESTRIL) 20 MG TABLET    Take 20 mg by mouth daily.   PRAVASTATIN (PRAVACHOL) 40 MG TABLET    Take 40 mg by mouth daily.   RIVAROXABAN (XARELTO) 15 MG TABS TABLET    Take 15 mg by mouth daily.  TAMSULOSIN (FLOMAX) 0.4 MG CAPS CAPSULE    Take 0.4 mg by mouth daily after supper. 30 minutes after the same meal every day.  Modified Medications   No medications on file  Discontinued Medications   No medications on file     No Known Allergies   REVIEW OF SYSTEMS:  GENERAL: no change in appetite, no fatigue, no weight changes, no fever, chills  EYES: Denies change in vision, dry eyes, eye pain, itching or discharge EARS: Denies change in hearing, ringing in ears, or earache NOSE: Denies nasal congestion or epistaxis MOUTH and THROAT: Denies oral discomfort, gingival pain or bleeding, pain from teeth or hoarseness   RESPIRATORY: no cough, SOB, DOE, wheezing, hemoptysis CARDIAC: no chest pain, edema or  palpitations GI: no abdominal pain, diarrhea, constipation, heart burn, nausea or vomiting GU: Denies dysuria, frequency, hematuria, incontinence, or discharge PSYCHIATRIC: Denies feeling of depression or anxiety. No report of hallucinations, insomnia, paranoia, or agitation   PHYSICAL EXAMINATION  GENERAL APPEARANCE: Well nourished. In no acute distress. Normal body habitus SKIN:  Left chest surgical incision (S/P PPM implantation) with Steri-Strips, no erythema, dry, with slight bruising on left chest HEAD: Normal in size and contour. No evidence of trauma EYES: Lids open and close normally. No blepharitis, entropion or ectropion. PERRL. Conjunctivae are clear and sclerae are white. Lenses are without opacity EARS: Pinnae are normal. Patient hears normal voice tunes of the examiner MOUTH and THROAT: Lips are without lesions. Oral mucosa is moist and without lesions. Tongue is normal in shape, size, and color and without lesions NECK: supple, trachea midline, no neck masses, no thyroid tenderness, no thyromegaly LYMPHATICS: no LAN in the neck, no supraclavicular LAN RESPIRATORY: breathing is even & unlabored, BS CTAB CARDIAC: RRR, no murmur,no extra heart sounds, no edema GI: abdomen soft, normal BS, no masses, no tenderness, no hepatomegaly, no splenomegaly EXTREMITIES:  Able to move 4 extremities PSYCHIATRIC: Alert and oriented X 3. Affect and behavior are appropriate  LABS/RADIOLOGY: Labs reviewed: Basic Metabolic Panel:  Recent Labs  16/10/96 0730  03/06/15 0542  08/16/15 0258 08/16/15 1606 08/17/15 0614 08/18/15 0420  NA 136  < > 141  < > 141  --  138 139  K 4.1  < > 3.3*  < > 3.5  --  3.6 3.9  CL 101  < > 110  < > 107  --  107 107  CO2 22  < > 23  < > 24  --  21* 23  GLUCOSE 162*  < > 190*  < > 88  --  153* 153*  BUN 18  < > 20  < > 19  --  17 18  CREATININE 1.26*  < > 1.13  < > 1.25* 1.31* 1.25* 1.22  CALCIUM 8.4*  < > 9.0  < > 8.8*  --  8.7* 9.3  MG 1.4*  --  1.8   --   --   --   --   --   < > = values in this interval not displayed. Liver Function Tests:  Recent Labs  03/01/15 1937 03/02/15 0730  AST 15 22  ALT 9* 12*  ALKPHOS 25* 29*  BILITOT 1.5* 1.6*  PROT 6.2* 7.1  ALBUMIN 3.2* 3.5   CBC:  Recent Labs  03/01/15 1937 03/02/15 0730  03/06/15 0542 08/15/15 1705 08/15/15 1714 08/16/15 1606  WBC 7.7 8.1  < > 5.3 4.7  --  5.3  NEUTROABS 6.0 6.3  --   --  2.3  --   --   HGB 12.3* 14.1  < > 12.6* 12.4* 13.6 12.0*  HCT 35.1* 40.6  < > 37.1* 37.4* 40.0 35.7*  MCV 88.9 88.8  < > 89.0 87.4  --  88.8  PLT 121* 108*  < > 166 159  --  148*  < > = values in this interval not displayed.  Cardiac Enzymes:  Recent Labs  08/15/15 1705  TROPONINI <0.03   CBG:  Recent Labs  03/07/15 0550 03/07/15 1144 03/07/15 1720  GLUCAP 166* 251* 313*      Dg Chest 2 View  08/17/2015  CLINICAL DATA:  Status post pacemaker placement. EXAM: CHEST  2 VIEW COMPARISON:  August 15, 2015. FINDINGS: Stable cardiomediastinal silhouette. Interval placement of left-sided pacemaker with single lead in grossly good position. Sternotomy wires are noted. No pneumothorax or pleural effusion is noted. Status post aortic valve replacement. No acute pulmonary disease is noted. Bony thorax is unremarkable. IMPRESSION: Interval placement of left-sided pacemaker with single lead in grossly good position. No pneumothorax is noted. Electronically Signed   By: Lupita Raider, M.D.   On: 08/17/2015 08:02   Ct Head Wo Contrast  08/16/2015  CLINICAL DATA:  Altered mental status and weakness for 2 days. History of stroke, cognitive defects, hypertension, diabetes. EXAM: CT HEAD WITHOUT CONTRAST TECHNIQUE: Contiguous axial images were obtained from the base of the skull through the vertex without intravenous contrast. COMPARISON:  CT head 03/01/2015.  MRI brain 11/13/2013. FINDINGS: Diffuse cerebral atrophy. Mild ventricular dilatation consistent with central atrophy.  Low-attenuation changes throughout the deep white matter consistent with small vessel ischemia. Old cerebellar infarcts. More prominent low-attenuation change in the deep white matter of the right centrum semiovale may represent old infarct. Similar appearance to previous study. No mass effect or midline shift. No abnormal extra-axial fluid collections. Gray-white matter junctions are distinct. Basal cisterns are not effaced. No evidence of acute intracranial hemorrhage. No depressed skull fractures. Visualized paranasal sinuses and mastoid air cells are not opacified. IMPRESSION: No acute intracranial abnormalities. Chronic atrophy and small vessel ischemic changes. Old infarcts in the right centrum semiovale and cerebellum. Electronically Signed   By: Burman Nieves M.D.   On: 08/16/2015 03:53   Dg Chest Portable 1 View  08/15/2015  CLINICAL DATA:  Weakness for 2 days, RIGHT lower chest pain, bradycardia, diabetes mellitus, hypertension, atrial fibrillation, coronary artery disease EXAM: PORTABLE CHEST 1 VIEW COMPARISON:  Portable exam 1714 hours compared 03/01/2015 FINDINGS: External pacing leads in EKG leads project over chest. Enlargement of cardiac silhouette post AVR. Atherosclerotic calcification and mild elongation of thoracic aorta. Mediastinal contours and pulmonary vascularity otherwise normal. Lungs clear. No pleural effusion or pneumothorax. Bones demineralized. IMPRESSION: Minimal enlargement of cardiac silhouette post AVR. No acute abnormalities. Electronically Signed   By: Ulyses Southward M.D.   On: 08/15/2015 17:26    ASSESSMENT/PLAN:  Symptomatic bradycardia S/P PM implantation - for rehabilitation; follow-up with cardiology on 08/31/15  Atrial fibrillation - rate controlled; continue Xarelto 15 mg 1 tab by mouth daily; check CBC  Postherpetic herpetic neuralgia - stable; continue Neurontin 300 mg 1 capsule by mouth twice a day  Diabetes mellitus, type II - continue Glucotrol 5 mg 1 tab  by mouth daily; check hemoglobin A1c  Hypertension  - continue lisinopril 20 mg 1 tab by mouth daily and Lasix 20 mg 1 tab by mouth daily; check CMP  Hyperlipidemia - continue Pravachol 40 mg 1 tab by mouth daily  BPH -  continue Flomax 0.4 mg 1 capsule by mouth daily      Goals of care:  Short-term rehabilitation     [facility], NP [facility] [phone]

## 2015-08-20 LAB — CULTURE, BLOOD (ROUTINE X 2)
CULTURE: NO GROWTH
Culture: NO GROWTH

## 2015-08-22 ENCOUNTER — Non-Acute Institutional Stay (SKILLED_NURSING_FACILITY): Payer: Medicare Other | Admitting: Internal Medicine

## 2015-08-22 ENCOUNTER — Encounter: Payer: Self-pay | Admitting: Internal Medicine

## 2015-08-22 DIAGNOSIS — F4321 Adjustment disorder with depressed mood: Secondary | ICD-10-CM | POA: Diagnosis not present

## 2015-08-22 DIAGNOSIS — R5381 Other malaise: Secondary | ICD-10-CM | POA: Diagnosis not present

## 2015-08-22 DIAGNOSIS — M1712 Unilateral primary osteoarthritis, left knee: Secondary | ICD-10-CM

## 2015-08-22 DIAGNOSIS — E1149 Type 2 diabetes mellitus with other diabetic neurological complication: Secondary | ICD-10-CM | POA: Diagnosis not present

## 2015-08-22 DIAGNOSIS — I2510 Atherosclerotic heart disease of native coronary artery without angina pectoris: Secondary | ICD-10-CM

## 2015-08-22 DIAGNOSIS — R001 Bradycardia, unspecified: Secondary | ICD-10-CM | POA: Diagnosis not present

## 2015-08-22 DIAGNOSIS — M179 Osteoarthritis of knee, unspecified: Secondary | ICD-10-CM | POA: Diagnosis not present

## 2015-08-22 DIAGNOSIS — M792 Neuralgia and neuritis, unspecified: Secondary | ICD-10-CM | POA: Diagnosis not present

## 2015-08-22 DIAGNOSIS — R0789 Other chest pain: Secondary | ICD-10-CM

## 2015-08-22 DIAGNOSIS — I482 Chronic atrial fibrillation, unspecified: Secondary | ICD-10-CM

## 2015-08-22 DIAGNOSIS — I10 Essential (primary) hypertension: Secondary | ICD-10-CM

## 2015-08-22 DIAGNOSIS — D62 Acute posthemorrhagic anemia: Secondary | ICD-10-CM | POA: Diagnosis not present

## 2015-08-22 DIAGNOSIS — N400 Enlarged prostate without lower urinary tract symptoms: Secondary | ICD-10-CM

## 2015-08-22 DIAGNOSIS — G8912 Acute post-thoracotomy pain: Secondary | ICD-10-CM

## 2015-08-22 DIAGNOSIS — G8918 Other acute postprocedural pain: Secondary | ICD-10-CM

## 2015-08-22 LAB — BASIC METABOLIC PANEL
BUN: 26 mg/dL — AB (ref 4–21)
CREATININE: 1.3 mg/dL (ref 0.6–1.3)
GLUCOSE: 149 mg/dL
POTASSIUM: 5 mmol/L (ref 3.4–5.3)
Sodium: 139 mmol/L (ref 137–147)

## 2015-08-22 LAB — HEPATIC FUNCTION PANEL
ALT: 13 U/L (ref 10–40)
AST: 17 U/L (ref 14–40)
Alkaline Phosphatase: 40 U/L (ref 25–125)
Bilirubin, Total: 0.7 mg/dL

## 2015-08-22 LAB — CBC AND DIFFERENTIAL
HCT: 34 % — AB (ref 41–53)
Hemoglobin: 11.2 g/dL — AB (ref 13.5–17.5)
PLATELETS: 162 10*3/uL (ref 150–399)
WBC: 4.9 10^3/mL

## 2015-08-22 LAB — HEMOGLOBIN A1C: HEMOGLOBIN A1C: 7.5

## 2015-08-22 NOTE — Progress Notes (Signed)
Patient ID: Harold Jones, male   DOB: 1927/07/26, 82 y.o.   MRN: [id number]    LOCATION: [facility]  PCP: Julieanne Manson, MD   Code Status: Full Code  Goals of care: Advanced Directive information Advanced Directives 08/15/2015  Does patient have an advance directive? (No Data)  Would patient like information on creating an advanced directive? -  Pre-existing out of facility DNR order (yellow form or pink MOST form) -    Extended Emergency Contact Information Primary Emergency Contact: [name],Ljerka Address: [address]          [place], [place] [address] [place] of [place] Home Phone: [phone] Relation: Grandaughter Secondary Emergency Contact: Lieutenant [place] of [place] Mobile Phone: [phone] Relation: Daughter   No Known Allergies  Chief Complaint  Patient presents with  . New Admit To SNF    New Admission     HPI:  Patient is a 82 y.o. male seen today for short term rehabilitation post hospital admission from 08/15/15-08/17/15 with symptomatic bradycardia. He underwent pacemaker implant. He has PMH of afib, CAD, AVR, CVA, HTN, DM among others. He is seen in his room today. HPI and ROS limited with language barrier. He complaints of soreness to his left chest area. He also complaints of left knee pain. Spoke with his grand daughter over the phone. She mentions that he has been complaining of left knee pain for several months. He lost his wife 2 weeks back. Patient and family denies depression.  Review of Systems:  Constitutional: Negative for fever HENT: Negative for headache Eyes: Negative for double vision and discharge.  Respiratory: Negative for shortness of breath and wheezing. Positive for dry cough  Cardiovascular: Negative for chest pain, palpitations Gastrointestinal: Negative for heartburn, nausea, vomiting, abdominal pain. Genitourinary: Negative for dysuria Musculoskeletal: Negative for fall Skin: Negative for itching,  rash.  Neurological: Negative for dizziness   Past Medical History  Diagnosis Date  . Hypertension   . Diabetes mellitus   . FIBRILLATION, ATRIAL 08/31/2002    Qualifier: Diagnosis of  By: Barbaraann Barthel MD, [place]    . CVA 02/28/2004    Annotation: embolic Qualifier: Diagnosis of  By: Barbaraann Barthel MD, [place]    . COGNITIVE DEFICITS DUE CEREBROVASCULAR DISEASE 09/09/2009    Qualifier: Diagnosis of  By: Delrae Alfred MD, Lanora Manis    . CORONARY ARTERY DISEASE 02/27/2007    Qualifier: Diagnosis of  By: Barbaraann Barthel MD, [place]    . DYSLIPIDEMIA 02/27/2007    Qualifier: Diagnosis of  By: Barbaraann Barthel MD, [place]    . Symptomatic bradycardia 08/16/15    [employer] PPM, Dr. Ladona Ridgel  . Aortic valve disorder    Past Surgical History  Procedure Laterality Date  . Cardiac surgery    . Ep implantable device N/A 08/16/2015    [employer] PPM, Dr. Ladona Ridgel   Social History:   reports that he has never smoked. He has never used smokeless tobacco. He reports that he does not drink alcohol or use illicit drugs.  History reviewed. No pertinent family history.  Medications:   Medication List       This list is accurate as of: 08/22/15 11:56 AM.  Always use your most recent med list.               furosemide 20 MG tablet  Commonly known as:  LASIX  Take 20 mg by mouth daily. Reported on 08/17/2015     gabapentin 300 MG capsule  Commonly known as:  NEURONTIN  Take 300 mg by  mouth 2 (two) times daily.     glipiZIDE 5 MG tablet  Commonly known as:  GLUCOTROL  Take 5 mg by mouth daily before breakfast.     lisinopril 20 MG tablet  Commonly known as:  PRINIVIL,ZESTRIL  Take 20 mg by mouth daily.     pravastatin 40 MG tablet  Commonly known as:  PRAVACHOL  Take 40 mg by mouth daily.     Rivaroxaban 15 MG Tabs tablet  Commonly known as:  XARELTO  Take 15 mg by mouth daily.     tamsulosin 0.4 MG Caps capsule  Commonly known as:  FLOMAX  Take 0.4 mg by mouth daily after supper. 30 minutes after  the same meal every day.        Immunizations: Immunization History  Administered Date(s) Administered  . Influenza Whole 04/01/2006, 05/18/2008, 07/22/2009  . PPD Test 03/22/2015, 08/18/2015  . Pneumococcal Polysaccharide-23 04/01/2005, 10/10/2011  . Td 03/02/1997, 11/16/2008  . Tdap 11/12/2013     Physical Exam: Filed Vitals:   08/22/15 1148  BP: 133/55  Pulse: 51  Temp: 96.8 F (36 C)  TempSrc: Oral  Resp: 18  Height: 6' (1.829 m)  Weight: 190 lb 12.8 oz (86.546 kg)  SpO2: 98%   Body mass index is 25.87 kg/(m^2).  General- elderly male, well built, in no acute distress Head- normocephalic, atraumatic Nose- no maxillary or frontal sinus tenderness, no nasal discharge Throat- moist mucus membrane Eyes- PERRLA, EOMI, no pallor, no icterus, no discharge, normal conjunctiva, normal sclera Neck- no cervical lymphadenopathy Cardiovascular- normal s1,s2, no murmurs, left chest pacemaker present Respiratory- bilateral clear to auscultation, no wheeze, no rhonchi, no crackles, no use of accessory muscles Abdomen- bowel sounds present, soft, non tender Musculoskeletal- able to move all 4 extremities, generalized weakness, left knee crepitus + Neurological- alert and oriented  Skin- warm and dry, left chest wall steristrip present Psychiatry- normal mood and affect    Labs reviewed: Basic Metabolic Panel:  Recent Labs  16/10/96 0730  03/06/15 0542  08/16/15 0258 08/16/15 1606 08/17/15 0614 08/18/15 0420  NA 136  < > 141  < > 141  --  138 139  K 4.1  < > 3.3*  < > 3.5  --  3.6 3.9  CL 101  < > 110  < > 107  --  107 107  CO2 22  < > 23  < > 24  --  21* 23  GLUCOSE 162*  < > 190*  < > 88  --  153* 153*  BUN 18  < > 20  < > 19  --  17 18  CREATININE 1.26*  < > 1.13  < > 1.25* 1.31* 1.25* 1.22  CALCIUM 8.4*  < > 9.0  < > 8.8*  --  8.7* 9.3  MG 1.4*  --  1.8  --   --   --   --   --   < > = values in this interval not displayed. Liver Function Tests:  Recent  Labs  03/01/15 1937 03/02/15 0730  AST 15 22  ALT 9* 12*  ALKPHOS 25* 29*  BILITOT 1.5* 1.6*  PROT 6.2* 7.1  ALBUMIN 3.2* 3.5   No results for input(s): LIPASE, AMYLASE in the last 8760 hours. No results for input(s): AMMONIA in the last 8760 hours. CBC:  Recent Labs  03/01/15 1937 03/02/15 0730  03/06/15 0542 08/15/15 08/15/15 1705 08/15/15 1714 08/16/15 1606  WBC 7.7 8.1  < > 5.3 4.7  4.7  --  5.3  NEUTROABS 6.0 6.3  --   --   --  2.3  --   --   HGB 12.3* 14.1  < > 12.6* 12.4* 12.4* 13.6 12.0*  HCT 35.1* 40.6  < > 37.1* 37* 37.4* 40.0 35.7*  MCV 88.9 88.8  < > 89.0  --  87.4  --  88.8  PLT 121* 108*  < > 166 159 159  --  148*  < > = values in this interval not displayed. Cardiac Enzymes:  Recent Labs  08/15/15 1705  TROPONINI <0.03   BNP: Invalid input(s): POCBNP CBG:  Recent Labs  03/07/15 0550 03/07/15 1144 03/07/15 1720  GLUCAP 166* 251* 313*    Radiological Exams: Dg Chest 2 View  08/17/2015  CLINICAL DATA:  Status post pacemaker placement. EXAM: CHEST  2 VIEW COMPARISON:  August 15, 2015. FINDINGS: Stable cardiomediastinal silhouette. Interval placement of left-sided pacemaker with single lead in grossly good position. Sternotomy wires are noted. No pneumothorax or pleural effusion is noted. Status post aortic valve replacement. No acute pulmonary disease is noted. Bony thorax is unremarkable. IMPRESSION: Interval placement of left-sided pacemaker with single lead in grossly good position. No pneumothorax is noted. Electronically Signed   By: Lupita Raider, M.D.   On: 08/17/2015 08:02   Ct Head Wo Contrast  08/16/2015  CLINICAL DATA:  Altered mental status and weakness for 2 days. History of stroke, cognitive defects, hypertension, diabetes. EXAM: CT HEAD WITHOUT CONTRAST TECHNIQUE: Contiguous axial images were obtained from the base of the skull through the vertex without intravenous contrast. COMPARISON:  CT head 03/01/2015.  MRI brain 11/13/2013.  FINDINGS: Diffuse cerebral atrophy. Mild ventricular dilatation consistent with central atrophy. Low-attenuation changes throughout the deep white matter consistent with small vessel ischemia. Old cerebellar infarcts. More prominent low-attenuation change in the deep white matter of the right centrum semiovale may represent old infarct. Similar appearance to previous study. No mass effect or midline shift. No abnormal extra-axial fluid collections. Gray-white matter junctions are distinct. Basal cisterns are not effaced. No evidence of acute intracranial hemorrhage. No depressed skull fractures. Visualized paranasal sinuses and mastoid air cells are not opacified. IMPRESSION: No acute intracranial abnormalities. Chronic atrophy and small vessel ischemic changes. Old infarcts in the right centrum semiovale and cerebellum. Electronically Signed   By: Burman Nieves M.D.   On: 08/16/2015 03:53   Dg Chest Portable 1 View  08/15/2015  CLINICAL DATA:  Weakness for 2 days, RIGHT lower chest pain, bradycardia, diabetes mellitus, hypertension, atrial fibrillation, coronary artery disease EXAM: PORTABLE CHEST 1 VIEW COMPARISON:  Portable exam 1714 hours compared 03/01/2015 FINDINGS: External pacing leads in EKG leads project over chest. Enlargement of cardiac silhouette post AVR. Atherosclerotic calcification and mild elongation of thoracic aorta. Mediastinal contours and pulmonary vascularity otherwise normal. Lungs clear. No pleural effusion or pneumothorax. Bones demineralized. IMPRESSION: Minimal enlargement of cardiac silhouette post AVR. No acute abnormalities. Electronically Signed   By: Ulyses Southward M.D.   On: 08/15/2015 17:26    Assessment/Plan  Physical deconditioning Will have him work with physical therapy and occupational therapy team to help with gait training and muscle strengthening exercises.fall precautions. Skin care. Encourage to be out of bed.   Symptomatic bradycardia  S/P pacemaker. Has f/u  with cardiology on 08/31/15. Currently asymptomatic  Chest wall soreness Post pacemaker surgery. Start tylenol 650 mg tid x 1 week, then bid for a week and then daily as needed  Left knee OA Above  regimen of tylenol should help. To work with therapy team  Blood loss anemia Monitor cbc  afib Rate controlled. Continue xarelto for anticoagulation  HTN Stable, monitor BP, continue lisinopril and lasix, check bmp  BPH Continue flomax  CAD Continue lisinopril and statin. Remains chest pain free  DM type 2 Monitor cbg, continue glucotrol and statin  Neuropathic pain continue Neurontin 300 mg bid  Grief period Lost his wife 2 weeks back. Spoke with patient and his grand daughter about counselling with psychology and seeing a psychiatrist if needed. Per family, patient coping fairly at present. Continue supportive care for now   Goals of care: short term rehabilitation   Labs/tests ordered: cbc, bmp  Family/ staff Communication: reviewed care plan with patient, his grand daughter over the phone and nursing supervisor    Oneal Grout, MD Internal Medicine [facility] [address] [place], [place] [address] Cell Phone (Monday-Friday 8 am - 5 pm): [phone] On Call: [phone] and follow prompts after 5 pm and on weekends Office Phone: [phone] Office Fax: [phone]

## 2015-08-31 ENCOUNTER — Ambulatory Visit: Payer: Medicare Other

## 2015-09-06 ENCOUNTER — Non-Acute Institutional Stay (SKILLED_NURSING_FACILITY): Payer: Medicare Other | Admitting: Adult Health

## 2015-09-06 ENCOUNTER — Encounter: Payer: Self-pay | Admitting: Adult Health

## 2015-09-06 DIAGNOSIS — I482 Chronic atrial fibrillation, unspecified: Secondary | ICD-10-CM

## 2015-09-06 DIAGNOSIS — R001 Bradycardia, unspecified: Secondary | ICD-10-CM | POA: Diagnosis not present

## 2015-09-06 DIAGNOSIS — I10 Essential (primary) hypertension: Secondary | ICD-10-CM

## 2015-09-06 DIAGNOSIS — R5381 Other malaise: Secondary | ICD-10-CM

## 2015-09-06 DIAGNOSIS — E785 Hyperlipidemia, unspecified: Secondary | ICD-10-CM | POA: Diagnosis not present

## 2015-09-06 DIAGNOSIS — E1149 Type 2 diabetes mellitus with other diabetic neurological complication: Secondary | ICD-10-CM | POA: Diagnosis not present

## 2015-09-06 DIAGNOSIS — E46 Unspecified protein-calorie malnutrition: Secondary | ICD-10-CM | POA: Diagnosis not present

## 2015-09-06 DIAGNOSIS — B0229 Other postherpetic nervous system involvement: Secondary | ICD-10-CM

## 2015-09-06 DIAGNOSIS — N400 Enlarged prostate without lower urinary tract symptoms: Secondary | ICD-10-CM

## 2015-09-06 NOTE — Progress Notes (Signed)
Patient ID: Harold Jones, male   DOB: [birth year], 82 y.o.   MRN: [id number]     DATE:     09/06/15  MRN:  [id number]  BIRTHDAY: [birth year]  Facility:  Nursing Home Location:  [facility]  Nursing Home Room Number: [phone]  LEVEL OF CARE:  SNF (31)  Contact Information    Name Relation Home Work Mobile   [place] Grandaughter [phone]     [place] Daughter   [phone]   [place] Fanny Relative [phone]         Code Status History    Date Active Date Inactive Code Status Order ID Comments User Context   03/01/2015 11:24 PM 03/07/2015  9:19 PM Full Code [id number]  Rolly Salter, MD ED   11/12/2013  2:53 PM 11/14/2013  5:48 PM Full Code [id number]  Leroy Sea, MD Inpatient   06/23/2013  6:22 PM 06/25/2013 10:01 PM Full Code [id number]  Edsel Petrin, DO Inpatient   10/08/2011  6:33 PM 10/12/2011  4:30 PM Full Code [id number]  Delcine Maryln Manuel, RN Inpatient       Chief Complaint  Patient presents with  . Discharge Note    HISTORY OF PRESENT ILLNESS:  This is an 82 year old male who is for discharge with Home health PT for endurance, OT for ADLs and CNA for showers. He has been admitted to [facility] on 08/18/15 from [facility] He has PMH of permanent A. fib on Xarelto with SVR historical daily with significant bradycardia in 2015 that improved with stopping his beta blocker and no PPM was felt indicated at that time. He, also also has PMH of bioprosthetic AVR, CAD, CVA, hypertension, diabetes mellitus and hyperlipidemia. MRI report shows evidence of multiple old bilateral remote cerebellar infarcts as well as midbrain lacunar infarcts but does not have any neurological sequelae. He is from [place] and does not speak [place]. He complained of weakness, dizziness, DOE, no syncope and was found to have bradycardia with rates 30s, transiently 26. PPM was implanted on 08/16/15.   Patient was admitted to this facility for short-term  rehabilitation after the patient's recent hospitalization.  Patient has completed SNF rehabilitation and therapy has cleared the patient for discharge.   PAST MEDICAL HISTORY:  Past Medical History  Diagnosis Date  . Hypertension   . Diabetes mellitus   . FIBRILLATION, ATRIAL 08/31/2002    Qualifier: Diagnosis of  By: Barbaraann Barthel MD, [place]    . CVA 02/28/2004    Annotation: embolic Qualifier: Diagnosis of  By: Barbaraann Barthel MD, [place]    . COGNITIVE DEFICITS DUE CEREBROVASCULAR DISEASE 09/09/2009    Qualifier: Diagnosis of  By: Delrae Alfred MD, Lanora Manis    . CORONARY ARTERY DISEASE 02/27/2007    Qualifier: Diagnosis of  By: Barbaraann Barthel MD, [place]    . DYSLIPIDEMIA 02/27/2007    Qualifier: Diagnosis of  By: Barbaraann Barthel MD, [place]    . Symptomatic bradycardia 08/16/15    [employer] PPM, Dr. Ladona Ridgel  . Aortic valve disorder   . Physical deconditioning   . Chest wall pain following surgery   . Osteoarthritis of left knee   . BPH without obstruction/lower urinary tract symptoms   . Grief   . Neuropathic pain      CURRENT MEDICATIONS: Reviewed  Patient's Medications  New Prescriptions   No medications on file  Previous Medications   ACETAMINOPHEN (TYLENOL) 325 MG TABLET    Take 650 mg by mouth every 12 (twelve) hours as needed for moderate pain.  ATORVASTATIN (LIPITOR) 10 MG TABLET    Take 10 mg by mouth daily.   FUROSEMIDE (LASIX) 20 MG TABLET    Take 20 mg by mouth daily. Reported on 08/17/2015   GABAPENTIN (NEURONTIN) 300 MG CAPSULE    Take 300 mg by mouth 2 (two) times daily.   GLIPIZIDE (GLUCOTROL) 5 MG TABLET    Take 5 mg by mouth daily before breakfast.    LISINOPRIL (PRINIVIL,ZESTRIL) 20 MG TABLET    Take 20 mg by mouth daily.   PROTEIN (PROCEL) POWD    Take 1 scoop by mouth 2 (two) times daily.   RIVAROXABAN (XARELTO) 15 MG TABS TABLET    Take 15 mg by mouth daily.   TAMSULOSIN (FLOMAX) 0.4 MG CAPS CAPSULE    Take 0.4 mg by mouth daily after supper. 30 minutes after the same meal  every day.  Modified Medications   No medications on file  Discontinued Medications   PRAVASTATIN (PRAVACHOL) 40 MG TABLET    Take 40 mg by mouth daily.     No Known Allergies   REVIEW OF SYSTEMS:  GENERAL: no change in appetite, no fatigue, no weight changes, no fever, chills  EYES: Denies change in vision, dry eyes, eye pain, itching or discharge EARS: Denies change in hearing, ringing in ears, or earache NOSE: Denies nasal congestion or epistaxis MOUTH and THROAT: Denies oral discomfort, gingival pain or bleeding, pain from teeth or hoarseness   RESPIRATORY: no cough, SOB, DOE, wheezing, hemoptysis CARDIAC: no chest pain, edema or palpitations GI: no abdominal pain, diarrhea, constipation, heart burn, nausea or vomiting GU: Denies dysuria, frequency, hematuria, incontinence, or discharge PSYCHIATRIC: Denies feeling of depression or anxiety. No report of hallucinations, insomnia, paranoia, or agitation   PHYSICAL EXAMINATION  GENERAL APPEARANCE: Well nourished. In no acute distress. Normal body habitus SKIN:  Left chest surgical incision (S/P PPM implantation) with Steri-Strips, no erythema, dry HEAD: Normal in size and contour. No evidence of trauma EYES: Lids open and close normally. No blepharitis, entropion or ectropion. PERRL. Conjunctivae are clear and sclerae are white. Lenses are without opacity EARS: Pinnae are normal. Patient hears normal voice tunes of the examiner MOUTH and THROAT: Lips are without lesions. Oral mucosa is moist and without lesions. Tongue is normal in shape, size, and color and without lesions NECK: supple, trachea midline, no neck masses, no thyroid tenderness, no thyromegaly LYMPHATICS: no LAN in the neck, no supraclavicular LAN RESPIRATORY: breathing is even & unlabored, BS CTAB CARDIAC: RRR, no murmur,no extra heart sounds, no edema GI: abdomen soft, normal BS, no masses, no tenderness, no hepatomegaly, no splenomegaly EXTREMITIES:  Able to  move 4 extremities; ambulatory PSYCHIATRIC: Alert and oriented X 3. Affect and behavior are appropriate  LABS/RADIOLOGY: Labs reviewed: Basic Metabolic Panel:  Recent Labs  16/04/9607/31/16 0730  03/06/15 0542  08/16/15 0258  08/17/15 0614 08/18/15 0420 08/22/15  NA 136  < > 141  < > 141  --  138 139 139  K 4.1  < > 3.3*  < > 3.5  --  3.6 3.9 5.0  CL 101  < > 110  < > 107  --  107 107  --   CO2 22  < > 23  < > 24  --  21* 23  --   GLUCOSE 162*  < > 190*  < > 88  --  153* 153*  --   BUN 18  < > 20  < > 19  --  17 18  26*  CREATININE 1.26*  < > 1.13  < > 1.25*  < > 1.25* 1.22 1.3  CALCIUM 8.4*  < > 9.0  < > 8.8*  --  8.7* 9.3  --   MG 1.4*  --  1.8  --   --   --   --   --   --   < > = values in this interval not displayed. Liver Function Tests:  Recent Labs  03/01/15 1937 03/02/15 0730 08/22/15  AST ALT 9* 12* 13  ALKPHOS 25* 29* 40  BILITOT 1.5* 1.6*  --   PROT 6.2* 7.1  --   ALBUMIN 3.2* 3.5  --    CBC:  Recent Labs  03/01/15 1937 03/02/15 0730  03/06/15 0542  08/15/15 1705 08/15/15 1714 08/16/15 1606 08/22/15  WBC 7.7 8.1  < > 5.3  < > 4.7  --  5.3 4.9  NEUTROABS 6.0 6.3  --   --   --  2.3  --   --   --   HGB 12.3* 14.1  < > 12.6*  < > 12.4* 13.6 12.0* 11.2*  HCT 35.1* 40.6  < > 37.1*  < > 37.4* 40.0 35.7* 34*  MCV 88.9 88.8  < > 89.0  --  87.4  --  88.8  --   PLT 121* 108*  < > 166  < > 159  --  148* 162  < > = values in this interval not displayed.  Cardiac Enzymes:  Recent Labs  08/15/15 1705  TROPONINI <0.03   CBG:  Recent Labs  03/07/15 0550 03/07/15 1144 03/07/15 1720  GLUCAP 166* 251* 313*      Dg Chest 2 View  08/17/2015  CLINICAL DATA:  Status post pacemaker placement. EXAM: CHEST  2 VIEW COMPARISON:  August 15, 2015. FINDINGS: Stable cardiomediastinal silhouette. Interval placement of left-sided pacemaker with single lead in grossly good position. Sternotomy wires are noted. No pneumothorax or pleural effusion is noted. Status  post aortic valve replacement. No acute pulmonary disease is noted. Bony thorax is unremarkable. IMPRESSION: Interval placement of left-sided pacemaker with single lead in grossly good position. No pneumothorax is noted. Electronically Signed   By: Lupita Raider, M.D.   On: 08/17/2015 08:02   Ct Head Wo Contrast  08/16/2015  CLINICAL DATA:  Altered mental status and weakness for 2 days. History of stroke, cognitive defects, hypertension, diabetes. EXAM: CT HEAD WITHOUT CONTRAST TECHNIQUE: Contiguous axial images were obtained from the base of the skull through the vertex without intravenous contrast. COMPARISON:  CT head 03/01/2015.  MRI brain 11/13/2013. FINDINGS: Diffuse cerebral atrophy. Mild ventricular dilatation consistent with central atrophy. Low-attenuation changes throughout the deep white matter consistent with small vessel ischemia. Old cerebellar infarcts. More prominent low-attenuation change in the deep white matter of the right centrum semiovale may represent old infarct. Similar appearance to previous study. No mass effect or midline shift. No abnormal extra-axial fluid collections. Gray-white matter junctions are distinct. Basal cisterns are not effaced. No evidence of acute intracranial hemorrhage. No depressed skull fractures. Visualized paranasal sinuses and mastoid air cells are not opacified. IMPRESSION: No acute intracranial abnormalities. Chronic atrophy and small vessel ischemic changes. Old infarcts in the right centrum semiovale and cerebellum. Electronically Signed   By: Burman Nieves M.D.   On: 08/16/2015 03:53   Dg Chest Portable 1 View  08/15/2015  CLINICAL DATA:  Weakness for 2 days, RIGHT lower chest pain, bradycardia, diabetes mellitus,  hypertension, atrial fibrillation, coronary artery disease EXAM: PORTABLE CHEST 1 VIEW COMPARISON:  Portable exam 1714 hours compared 03/01/2015 FINDINGS: External pacing leads in EKG leads project over chest. Enlargement of cardiac  silhouette post AVR. Atherosclerotic calcification and mild elongation of thoracic aorta. Mediastinal contours and pulmonary vascularity otherwise normal. Lungs clear. No pleural effusion or pneumothorax. Bones demineralized. IMPRESSION: Minimal enlargement of cardiac silhouette post AVR. No acute abnormalities. Electronically Signed   By: Ulyses Southward M.D.   On: 08/15/2015 17:26    ASSESSMENT/PLAN:  Physical deconditioning - for Home health PT, OT and CNA  Symptomatic bradycardia S/P PM implantation -  follow-up with cardiology   Atrial fibrillation - rate controlled; continue Xarelto 15 mg 1 tab by mouth daily  Postherpetic herpetic neuralgia - stable; continue Neurontin 300 mg 1 capsule by mouth twice a day  Diabetes mellitus, type II - continue Glucotrol 5 mg 1 tab by mouth daily;  hemoglobin A1c 7.5  Hypertension  - well-controlled; continue lisinopril 20 mg 1 tab by mouth daily and Lasix 20 mg 1 tab by mouth daily  Hyperlipidemia - continue Pravachol 40 mg 1 tab by mouth daily  BPH - continue Flomax 0.4 mg 1 capsule by mouth daily  Protein-calorie malnutrition - albumin 3.28; continue Procel 1 scoop PO BID     I have filled out patient's discharge paperwork and written prescriptions.  Patient will receive home health PT, OT and CNA.  Total discharge time: Less than 30 minutes  Discharge time involved coordination of the discharge process with Child psychotherapist, nursing staff and therapy department. Medical justification for home health services verified.    [facility], NP [employer] [phone]

## 2015-09-07 ENCOUNTER — Encounter: Payer: Self-pay | Admitting: Internal Medicine

## 2015-09-13 ENCOUNTER — Telehealth: Payer: Self-pay | Admitting: Internal Medicine

## 2015-09-13 NOTE — Telephone Encounter (Signed)
Spoke w/ pt nurse and she informed me to call pt daughter to schedule appointment. Spoke w/ pt daughter and scheduled wound check appointment on 09-15-15 at 10:00 AM. I verbalized to pt daughter that it is very important that pt comes to this appointment. Pt daughter had no questions at this time. She agreed to appointment and verbalized understanding of the importance of the appointment.

## 2015-09-13 NOTE — Telephone Encounter (Signed)
Spoke w/ nurse at [facility]. I instructed her the device clinic RN wants to bring pt into the office on Thursday 09-15-15 between 9AM and 12PM. Pt nurse to going to call me back to schedule this appointment.

## 2015-09-13 NOTE — Telephone Encounter (Addendum)
New message       Area around pacemaker is raised up about 4cm.  It looks like a 1/2 tennis ball has been placed under the skin.  It is not red or painful to the touch.  Pacemaker was put in 08-17-15. Pt does not speak english and it is difficult to get more info.  Nurse said she has never seen this before.  Please call.

## 2015-09-15 ENCOUNTER — Ambulatory Visit (INDEPENDENT_AMBULATORY_CARE_PROVIDER_SITE_OTHER): Payer: Medicare Other | Admitting: *Deleted

## 2015-09-15 ENCOUNTER — Encounter: Payer: Self-pay | Admitting: Internal Medicine

## 2015-09-15 DIAGNOSIS — I4891 Unspecified atrial fibrillation: Secondary | ICD-10-CM

## 2015-09-15 DIAGNOSIS — Z950 Presence of cardiac pacemaker: Secondary | ICD-10-CM | POA: Diagnosis not present

## 2015-09-15 LAB — CUP PACEART INCLINIC DEVICE CHECK
Implantable Lead Implant Date: 20170214
Lead Channel Impedance Value: 1024 Ohm
Lead Channel Pacing Threshold Pulse Width: 0.4 ms
Lead Channel Sensing Intrinsic Amplitude: 17.8 mV
Lead Channel Setting Pacing Amplitude: 1.2 V
Lead Channel Setting Pacing Pulse Width: 0.4 ms
MDC IDC LEAD LOCATION: 753860
MDC IDC LEAD SERIAL: 699159
MDC IDC MSMT LEADCHNL RV PACING THRESHOLD AMPLITUDE: 0.7 V
MDC IDC PG SERIAL: 714375
MDC IDC SESS DTM: 20170316040000
MDC IDC SET LEADCHNL RV SENSING SENSITIVITY: 2.5 mV

## 2015-09-15 NOTE — Patient Instructions (Signed)
Completed wound check appointment for patient's pacemaker.  Removed a stitch from medial incision, bandage may come off tomorrow.  May keep open to air.  Wash site gently with soap and water twice daily.  [facility] on 09/28/15 at 9:00am for a wound re-check to ensure the site is healed.  Patient may shower, but avoid submerging in bath, swimming pool, or hot tub for 2 more weeks.  Patient may lift/push/pull up to 20lbs until 09/27/15, at which point he has no restrictions.    Call the device clinic at [phone] if you notice increased swelling, redness, or warmth at device site.  Also call if you have fever or chills.

## 2015-09-15 NOTE — Progress Notes (Signed)
Wound check appointment, added-on for increased swelling at device site. Steri-strips previously removed by patient. Wound without redness. Healing ecchymosis noted along incision. Edema present over device site, soft to palpation. Incision edges approximated, stitch removed from medial incision, applied antibiotic ointment and bandaid. JA saw patient, minimal concern for active bleeding, recommended returning in 2 weeks for wound recheck. Patient and daughter aware (via interpreter) to wash site twice daily with soap and water. Patient and daughter verbalize understanding of instructions to call with signs/symptoms of infection.  Normal device function. Thresholds, sensing, and impedances consistent with implant measurements. Auto capture programmed on for extra safety margin until 3 month visit. Histogram distribution appropriate for patient and level of activity. No high ventricular rates noted. Patient educated about wound care, arm mobility, lifting restrictions. ROV with device clinic on 09/28/15 for wound recheck and in 3 months with GT.

## 2015-09-28 ENCOUNTER — Ambulatory Visit (INDEPENDENT_AMBULATORY_CARE_PROVIDER_SITE_OTHER): Payer: Medicare Other | Admitting: *Deleted

## 2015-09-28 DIAGNOSIS — I4891 Unspecified atrial fibrillation: Secondary | ICD-10-CM

## 2015-09-28 NOTE — Progress Notes (Signed)
Patient presents to the office for a wound recheck s/p ppm implant on 08/16/15. Incision site evaluated. Stitch removed from mid incision. Wound well healed without redness, ecchymosis, or drainage. Incision edges approximated. Small hematoma still remains.  Dr.Taylor evaluated site and recommended that patient follow up as scheduled. Daughter voiced understanding through interpreter.

## 2015-11-14 ENCOUNTER — Telehealth: Payer: Self-pay | Admitting: Internal Medicine

## 2015-11-14 ENCOUNTER — Encounter: Payer: Self-pay | Admitting: Internal Medicine

## 2015-11-14 NOTE — Telephone Encounter (Signed)
Call pt and left message for pt to call back to update Fx Hx.

## 2015-11-16 ENCOUNTER — Encounter: Payer: Medicare Other | Admitting: Internal Medicine

## 2015-11-17 ENCOUNTER — Encounter: Payer: Self-pay | Admitting: Internal Medicine

## 2016-01-12 ENCOUNTER — Encounter (HOSPITAL_COMMUNITY): Payer: Self-pay

## 2016-01-12 ENCOUNTER — Emergency Department (HOSPITAL_COMMUNITY): Payer: Medicare Other

## 2016-01-12 ENCOUNTER — Inpatient Hospital Stay (HOSPITAL_COMMUNITY)
Admission: EM | Admit: 2016-01-12 | Discharge: 2016-01-19 | DRG: 377 | Disposition: A | Discharge status: Skilled Nursing Facility | Payer: Medicare Other | Attending: Internal Medicine | Admitting: Internal Medicine

## 2016-01-12 DIAGNOSIS — F0150 Vascular dementia without behavioral disturbance: Secondary | ICD-10-CM | POA: Diagnosis not present

## 2016-01-12 DIAGNOSIS — I482 Chronic atrial fibrillation: Secondary | ICD-10-CM | POA: Diagnosis present

## 2016-01-12 DIAGNOSIS — Z781 Physical restraint status: Secondary | ICD-10-CM

## 2016-01-12 DIAGNOSIS — R410 Disorientation, unspecified: Secondary | ICD-10-CM | POA: Diagnosis not present

## 2016-01-12 DIAGNOSIS — K922 Gastrointestinal hemorrhage, unspecified: Secondary | ICD-10-CM | POA: Diagnosis present

## 2016-01-12 DIAGNOSIS — I69319 Unspecified symptoms and signs involving cognitive functions following cerebral infarction: Secondary | ICD-10-CM | POA: Diagnosis not present

## 2016-01-12 DIAGNOSIS — E1100 Type 2 diabetes mellitus with hyperosmolarity without nonketotic hyperglycemic-hyperosmolar coma (NKHHC): Secondary | ICD-10-CM | POA: Diagnosis not present

## 2016-01-12 DIAGNOSIS — Z7984 Long term (current) use of oral hypoglycemic drugs: Secondary | ICD-10-CM

## 2016-01-12 DIAGNOSIS — Z7901 Long term (current) use of anticoagulants: Secondary | ICD-10-CM

## 2016-01-12 DIAGNOSIS — R400 Somnolence: Secondary | ICD-10-CM | POA: Diagnosis not present

## 2016-01-12 DIAGNOSIS — Z950 Presence of cardiac pacemaker: Secondary | ICD-10-CM

## 2016-01-12 DIAGNOSIS — D62 Acute posthemorrhagic anemia: Secondary | ICD-10-CM | POA: Diagnosis present

## 2016-01-12 DIAGNOSIS — I959 Hypotension, unspecified: Secondary | ICD-10-CM | POA: Diagnosis present

## 2016-01-12 DIAGNOSIS — I272 Other secondary pulmonary hypertension: Secondary | ICD-10-CM | POA: Diagnosis present

## 2016-01-12 DIAGNOSIS — I10 Essential (primary) hypertension: Secondary | ICD-10-CM | POA: Diagnosis present

## 2016-01-12 DIAGNOSIS — K222 Esophageal obstruction: Secondary | ICD-10-CM | POA: Diagnosis present

## 2016-01-12 DIAGNOSIS — E1122 Type 2 diabetes mellitus with diabetic chronic kidney disease: Secondary | ICD-10-CM | POA: Diagnosis present

## 2016-01-12 DIAGNOSIS — Z953 Presence of xenogenic heart valve: Secondary | ICD-10-CM | POA: Diagnosis not present

## 2016-01-12 DIAGNOSIS — E785 Hyperlipidemia, unspecified: Secondary | ICD-10-CM | POA: Diagnosis present

## 2016-01-12 DIAGNOSIS — K449 Diaphragmatic hernia without obstruction or gangrene: Secondary | ICD-10-CM | POA: Diagnosis present

## 2016-01-12 DIAGNOSIS — N400 Enlarged prostate without lower urinary tract symptoms: Secondary | ICD-10-CM | POA: Diagnosis present

## 2016-01-12 DIAGNOSIS — E1151 Type 2 diabetes mellitus with diabetic peripheral angiopathy without gangrene: Secondary | ICD-10-CM | POA: Diagnosis present

## 2016-01-12 DIAGNOSIS — H9190 Unspecified hearing loss, unspecified ear: Secondary | ICD-10-CM | POA: Diagnosis present

## 2016-01-12 DIAGNOSIS — N182 Chronic kidney disease, stage 2 (mild): Secondary | ICD-10-CM | POA: Diagnosis present

## 2016-01-12 DIAGNOSIS — E119 Type 2 diabetes mellitus without complications: Secondary | ICD-10-CM

## 2016-01-12 DIAGNOSIS — I2510 Atherosclerotic heart disease of native coronary artery without angina pectoris: Secondary | ICD-10-CM | POA: Diagnosis present

## 2016-01-12 DIAGNOSIS — I130 Hypertensive heart and chronic kidney disease with heart failure and stage 1 through stage 4 chronic kidney disease, or unspecified chronic kidney disease: Secondary | ICD-10-CM | POA: Diagnosis present

## 2016-01-12 DIAGNOSIS — R451 Restlessness and agitation: Secondary | ICD-10-CM | POA: Diagnosis not present

## 2016-01-12 DIAGNOSIS — R404 Transient alteration of awareness: Secondary | ICD-10-CM | POA: Diagnosis not present

## 2016-01-12 DIAGNOSIS — I5033 Acute on chronic diastolic (congestive) heart failure: Secondary | ICD-10-CM | POA: Diagnosis not present

## 2016-01-12 DIAGNOSIS — R112 Nausea with vomiting, unspecified: Secondary | ICD-10-CM

## 2016-01-12 DIAGNOSIS — Z01810 Encounter for preprocedural cardiovascular examination: Secondary | ICD-10-CM | POA: Diagnosis not present

## 2016-01-12 DIAGNOSIS — I4891 Unspecified atrial fibrillation: Secondary | ICD-10-CM | POA: Diagnosis present

## 2016-01-12 DIAGNOSIS — R4182 Altered mental status, unspecified: Secondary | ICD-10-CM | POA: Insufficient documentation

## 2016-01-12 DIAGNOSIS — E118 Type 2 diabetes mellitus with unspecified complications: Secondary | ICD-10-CM | POA: Diagnosis not present

## 2016-01-12 DIAGNOSIS — Z8673 Personal history of transient ischemic attack (TIA), and cerebral infarction without residual deficits: Secondary | ICD-10-CM

## 2016-01-12 DIAGNOSIS — I481 Persistent atrial fibrillation: Secondary | ICD-10-CM | POA: Diagnosis not present

## 2016-01-12 DIAGNOSIS — Z954 Presence of other heart-valve replacement: Secondary | ICD-10-CM | POA: Diagnosis not present

## 2016-01-12 DIAGNOSIS — Z952 Presence of prosthetic heart valve: Secondary | ICD-10-CM

## 2016-01-12 LAB — COMPREHENSIVE METABOLIC PANEL
ALK PHOS: 32 U/L — AB (ref 38–126)
ALT: 13 U/L — ABNORMAL LOW (ref 17–63)
ANION GAP: 7 (ref 5–15)
AST: 13 U/L — ABNORMAL LOW (ref 15–41)
Albumin: 3.5 g/dL (ref 3.5–5.0)
BILIRUBIN TOTAL: 0.9 mg/dL (ref 0.3–1.2)
BUN: 50 mg/dL — ABNORMAL HIGH (ref 6–20)
CALCIUM: 9.1 mg/dL (ref 8.9–10.3)
CO2: 24 mmol/L (ref 22–32)
Chloride: 107 mmol/L (ref 101–111)
Creatinine, Ser: 1.95 mg/dL — ABNORMAL HIGH (ref 0.61–1.24)
GFR calc non Af Amer: 29 mL/min — ABNORMAL LOW (ref >60)
GFR, EST AFRICAN AMERICAN: 34 mL/min — AB (ref >60)
Glucose, Bld: 178 mg/dL — ABNORMAL HIGH (ref 65–99)
POTASSIUM: 4.8 mmol/L (ref 3.5–5.1)
Sodium: 138 mmol/L (ref 135–145)
TOTAL PROTEIN: 6.7 g/dL (ref 6.5–8.1)

## 2016-01-12 LAB — GLUCOSE, CAPILLARY
GLUCOSE-CAPILLARY: 87 mg/dL (ref 65–99)
GLUCOSE-CAPILLARY: 119 mg/dL — AB (ref 65–99)

## 2016-01-12 LAB — URINALYSIS, ROUTINE W REFLEX MICROSCOPIC
BILIRUBIN URINE: NEGATIVE
Glucose, UA: NEGATIVE mg/dL
Hgb urine dipstick: NEGATIVE
Ketones, ur: NEGATIVE mg/dL
LEUKOCYTES UA: NEGATIVE
NITRITE: NEGATIVE
Protein, ur: NEGATIVE mg/dL
SPECIFIC GRAVITY, URINE: 1.014 (ref 1.005–1.030)
pH: 6 (ref 5.0–8.0)

## 2016-01-12 LAB — CBC WITH DIFFERENTIAL/PLATELET
BASOS ABS: 0 10*3/uL (ref 0.0–0.1)
BASOS PCT: 0 %
EOS ABS: 0.1 10*3/uL (ref 0.0–0.7)
Eosinophils Relative: 2 %
HCT: 19.8 % — ABNORMAL LOW (ref 39.0–52.0)
HEMOGLOBIN: 6 g/dL — AB (ref 13.0–17.0)
Lymphocytes Relative: 19 %
Lymphs Abs: 1.2 10*3/uL (ref 0.7–4.0)
MCH: 24.2 pg — ABNORMAL LOW (ref 26.0–34.0)
MCHC: 30.3 g/dL (ref 30.0–36.0)
MCV: 79.8 fL (ref 78.0–100.0)
Monocytes Absolute: 0.4 10*3/uL (ref 0.1–1.0)
Monocytes Relative: 6 %
NEUTROS PCT: 72 %
Neutro Abs: 4.5 10*3/uL (ref 1.7–7.7)
Platelets: 185 10*3/uL (ref 150–400)
RBC: 2.48 MIL/uL — AB (ref 4.22–5.81)
RDW: 14.4 % (ref 11.5–15.5)
WBC: 6.3 10*3/uL (ref 4.0–10.5)

## 2016-01-12 LAB — MRSA PCR SCREENING: MRSA BY PCR: NEGATIVE

## 2016-01-12 LAB — POC OCCULT BLOOD, ED: FECAL OCCULT BLD: POSITIVE — AB

## 2016-01-12 LAB — LIPASE, BLOOD: LIPASE: 19 U/L (ref 11–51)

## 2016-01-12 LAB — PREPARE RBC (CROSSMATCH)

## 2016-01-12 LAB — CBG MONITORING, ED: Glucose-Capillary: 67 mg/dL (ref 65–99)

## 2016-01-12 LAB — TROPONIN I: TROPONIN I: 0.03 ng/mL — AB (ref <0.03)

## 2016-01-12 MED ORDER — ONDANSETRON HCL 4 MG/2ML IJ SOLN
4.0000 mg | Freq: Once | INTRAMUSCULAR | Status: AC
  Administered 2016-01-12: 4 mg via INTRAVENOUS
  Filled 2016-01-12: qty 2

## 2016-01-12 MED ORDER — MORPHINE SULFATE (PF) 2 MG/ML IV SOLN
1.0000 mg | INTRAVENOUS | Status: DC | PRN
  Administered 2016-01-14 – 2016-01-17 (×7): 1 mg via INTRAVENOUS
  Filled 2016-01-12 (×8): qty 1

## 2016-01-12 MED ORDER — SODIUM CHLORIDE 0.9 % IV SOLN
INTRAVENOUS | Status: DC
  Administered 2016-01-12 – 2016-01-16 (×8): via INTRAVENOUS

## 2016-01-12 MED ORDER — TAMSULOSIN HCL 0.4 MG PO CAPS
0.4000 mg | ORAL_CAPSULE | Freq: Every day | ORAL | Status: DC
  Administered 2016-01-12 – 2016-01-18 (×5): 0.4 mg via ORAL
  Filled 2016-01-12 (×5): qty 1

## 2016-01-12 MED ORDER — SODIUM CHLORIDE 0.9 % IV SOLN
10.0000 mL/h | Freq: Once | INTRAVENOUS | Status: AC
  Administered 2016-01-12: 10 mL/h via INTRAVENOUS

## 2016-01-12 MED ORDER — FAMOTIDINE IN NACL 20-0.9 MG/50ML-% IV SOLN
20.0000 mg | Freq: Two times a day (BID) | INTRAVENOUS | Status: DC
  Administered 2016-01-12 – 2016-01-15 (×5): 20 mg via INTRAVENOUS
  Filled 2016-01-12 (×8): qty 50

## 2016-01-12 MED ORDER — ONDANSETRON HCL 4 MG/2ML IJ SOLN: 4.0000 mg | Freq: Four times a day (QID) | INTRAMUSCULAR | Status: DC | PRN

## 2016-01-12 MED ORDER — POLYETHYLENE GLYCOL 3350 17 G PO PACK
17.0000 g | PACK | Freq: Every day | ORAL | Status: DC
  Administered 2016-01-17 – 2016-01-19 (×3): 17 g via ORAL
  Filled 2016-01-12 (×3): qty 1

## 2016-01-12 MED ORDER — GABAPENTIN 300 MG PO CAPS
300.0000 mg | ORAL_CAPSULE | Freq: Two times a day (BID) | ORAL | Status: DC
  Administered 2016-01-12 – 2016-01-19 (×9): 300 mg via ORAL
  Filled 2016-01-12 (×12): qty 1

## 2016-01-12 MED ORDER — DOCUSATE SODIUM 100 MG PO CAPS
100.0000 mg | ORAL_CAPSULE | Freq: Two times a day (BID) | ORAL | Status: DC
  Administered 2016-01-12 – 2016-01-19 (×9): 100 mg via ORAL
  Filled 2016-01-12 (×11): qty 1

## 2016-01-12 MED ORDER — DIATRIZOATE MEGLUMINE & SODIUM 66-10 % PO SOLN
ORAL | Status: AC
  Filled 2016-01-12: qty 30

## 2016-01-12 MED ORDER — ATORVASTATIN CALCIUM 10 MG PO TABS
10.0000 mg | ORAL_TABLET | Freq: Every day | ORAL | Status: DC
  Administered 2016-01-15 – 2016-01-19 (×4): 10 mg via ORAL
  Filled 2016-01-12 (×6): qty 1

## 2016-01-12 MED ORDER — INSULIN ASPART 100 UNIT/ML ~~LOC~~ SOLN
0.0000 [IU] | SUBCUTANEOUS | Status: DC
  Administered 2016-01-13 – 2016-01-14 (×2): 3 [IU] via SUBCUTANEOUS
  Administered 2016-01-14 – 2016-01-15 (×4): 2 [IU] via SUBCUTANEOUS
  Administered 2016-01-15: 5 [IU] via SUBCUTANEOUS
  Administered 2016-01-15: 2 [IU] via SUBCUTANEOUS
  Administered 2016-01-15: 5 [IU] via SUBCUTANEOUS
  Administered 2016-01-15 (×2): 2 [IU] via SUBCUTANEOUS
  Administered 2016-01-16 (×2): 3 [IU] via SUBCUTANEOUS
  Administered 2016-01-16 – 2016-01-17 (×2): 2 [IU] via SUBCUTANEOUS
  Administered 2016-01-17: 3 [IU] via SUBCUTANEOUS
  Administered 2016-01-17: 2 [IU] via SUBCUTANEOUS

## 2016-01-12 MED ORDER — ONDANSETRON HCL 4 MG PO TABS
4.0000 mg | ORAL_TABLET | Freq: Four times a day (QID) | ORAL | Status: DC | PRN
Start: 2016-01-12 — End: 2016-01-19

## 2016-01-12 NOTE — ED Notes (Signed)
Pt arrives EMS from nursing home with c/o chest/abdominal pain x 3 days with episodes  Of nausea and vomiting.. Pt only speaks [place] and has dementia leading to poorly localized pain. No facial grimicing, pt relaxed posture and resp even, non labored with skin warm and dry. NAD noted.

## 2016-01-12 NOTE — ED Notes (Signed)
Spoke with granddaughter and she spoke with patient on phone. She states he says his pain is much improved. Pt is taking po contrast again and tolerating fairly well.

## 2016-01-12 NOTE — Progress Notes (Signed)
Admission note:  Arrival Method: Stretcher from ED Mental Orientation: A&OX3 Telemetry: [id number] CCMD notified Assessment: See flowsheet Skin: Dry' intact IV: R wrist;  L wrist Pain: Denis Tubes: N/A Safety Measures: bed in lowest position, call ight within reach, bed alarm activated, pt placed in camera room. Admission Screening: To be completed- Pt does not speak english and was unable to answer some question with the interpreter.  6700 Orientation: Patient has been oriented to the unit, staff and to the room.  Orders have been reviewed and implemented. Will continue to assess and monitor pt.   Jonell CluckKadeesha Hyla Coard, RN, [facility] [address] [place] Phone [phone]

## 2016-01-12 NOTE — ED Notes (Signed)
Daughter at bedside.

## 2016-01-12 NOTE — ED Provider Notes (Signed)
CSN: [id number]     Arrival date & time 01/12/16  0830 History   First MD Initiated Contact with Patient 01/12/16 [phone]     Chief Complaint  Patient presents with  . Chest Pain  . Abdominal Pain     (Consider location/radiation/quality/duration/timing/severity/associated sxs/prior Treatment) HPI  Patient presents with concern of abdominal pain, nausea, vomiting. Notably, the patient speaks [place], has dementia, level V caveat. Per report the patient had multiple episodes of vomiting overnight, complained of abdominal pain. Currently, the patient states that he has minimal pain, nausea is minimal as well. He does acknowledge having multiple episodes of vomiting overnight states that his belly hurt diffusely. History of present illness obtained via translator phone. Patient otherwise cannot provide details of his recent history, changes in medical condition, medication, diet.   Past Medical History  Diagnosis Date  . Hypertension   . Diabetes mellitus   . FIBRILLATION, ATRIAL 08/31/2002    Qualifier: Diagnosis of  By: Barbaraann Barthelankins MD, [place]    . CVA 02/28/2004    Annotation: embolic Qualifier: Diagnosis of  By: Barbaraann Barthelankins MD, [place]    . COGNITIVE DEFICITS DUE CEREBROVASCULAR DISEASE 09/09/2009    Qualifier: Diagnosis of  By: Delrae AlfredMulberry MD, Lanora ManisElizabeth    . CORONARY ARTERY DISEASE 02/27/2007    Qualifier: Diagnosis of  By: Barbaraann Barthelankins MD, [place]    . DYSLIPIDEMIA 02/27/2007    Qualifier: Diagnosis of  By: Barbaraann Barthelankins MD, [place]    . Symptomatic bradycardia 08/16/15    [employer] PPM, Dr. Ladona Ridgelaylor  . Aortic valve disorder   . Physical deconditioning   . Chest wall pain following surgery   . Osteoarthritis of left knee   . BPH without obstruction/lower urinary tract symptoms   . Grief   . Neuropathic pain    Past Surgical History  Procedure Laterality Date  . Cardiac surgery    . Ep implantable device N/A 08/16/2015    [facility] Dr. Ladona Ridgelaylor   History reviewed. No  pertinent family history. Social History  Substance Use Topics  . Smoking status: Never Smoker   . Smokeless tobacco: Never Used  . Alcohol Use: No    Review of Systems  Unable to perform ROS: Dementia      Allergies  Review of patient's allergies indicates no known allergies.  Home Medications   Prior to Admission medications   Medication Sig Start Date End Date Taking? Authorizing Provider  acetaminophen (TYLENOL) 325 MG tablet Take 650 mg by mouth 2 (two) times daily.    Yes Historical Provider, MD  atorvastatin (LIPITOR) 10 MG tablet Take 10 mg by mouth daily.   Yes Historical Provider, MD  docusate sodium (COLACE) 100 MG capsule Take 100 mg by mouth 2 (two) times daily.   Yes Historical Provider, MD  furosemide (LASIX) 20 MG tablet Take 20 mg by mouth daily. Reported on 08/17/2015   Yes Historical Provider, MD  gabapentin (NEURONTIN) 300 MG capsule Take 300 mg by mouth 2 (two) times daily.   Yes Historical Provider, MD  glipiZIDE (GLUCOTROL) 5 MG tablet Take 5 mg by mouth daily before breakfast.  11/14/13  Yes Esperanza SheetsUlugbek N Buriev, MD  lisinopril (PRINIVIL,ZESTRIL) 20 MG tablet Take 20 mg by mouth daily.   Yes Historical Provider, MD  polyethylene glycol (MIRALAX / GLYCOLAX) packet Take 17 g by mouth daily.   Yes Historical Provider, MD  Rivaroxaban (XARELTO) 15 MG TABS tablet Take 15 mg by mouth daily.   Yes Historical Provider, MD  tamsulosin [facility](FLOMAX)  0.4 MG CAPS capsule Take 0.4 mg by mouth daily after supper. 30 minutes after the same meal every day. 02/12/15  Yes Historical Provider, MD   BP 109/47 mmHg  Pulse 53  Temp(Src) 97.9 F (36.6 C) (Oral)  Resp 17  Ht 5\' 10"  (1.778 m)  Wt 190 lb (86.183 kg)  BMI 27.26 kg/m2  SpO2 97% Physical Exam  Constitutional: He appears well-developed. No distress.  HENT:  Head: Normocephalic and atraumatic.  Eyes: Conjunctivae and EOM are normal.  Cardiovascular: Normal rate and regular rhythm.   Pulmonary/Chest: Effort normal. No  stridor. No respiratory distress.  Abdominal: He exhibits no distension.  No guarding,a substantial tenderness to palpation  Musculoskeletal: He exhibits no edema.  Neurological: He is alert.  Skin: Skin is warm and dry.  Psychiatric: He is withdrawn. Cognition and memory are impaired.  Nursing note and vitals reviewed.   ED Course  Procedures (including critical care time) Labs Review Labs Reviewed  COMPREHENSIVE METABOLIC PANEL - Abnormal; Notable for the following:    Glucose, Bld 178 (*)    BUN 50 (*)    Creatinine, Ser 1.95 (*)    AST 13 (*)    ALT 13 (*)    Alkaline Phosphatase 32 (*)    GFR calc non Af Amer 29 (*)    GFR calc Af Amer 34 (*)    All other components within normal limits  CBC WITH DIFFERENTIAL/PLATELET - Abnormal; Notable for the following:    RBC 2.48 (*)    Hemoglobin 6.0 (*)    HCT 19.8 (*)    MCH 24.2 (*)    All other components within normal limits  TROPONIN I - Abnormal; Notable for the following:    Troponin I 0.03 (*)    All other components within normal limits  POC OCCULT BLOOD, ED - Abnormal; Notable for the following:    Fecal Occult Bld POSITIVE (*)    All other components within normal limits  LIPASE, BLOOD  URINALYSIS, ROUTINE W REFLEX MICROSCOPIC (NOT AT [facility])  PREPARE RBC (CROSSMATCH)  TYPE AND SCREEN    Initial labs notable for hemoglobin 6. On repeat exam the patient remains in similar condition. Rectal exam notable for fecal occult positive stool, grossly negative.   Imaging Review Ct Abdomen Pelvis Wo Contrast  01/12/2016  CLINICAL DATA:  Abdominal pain with nausea vomiting. EXAM: CT ABDOMEN AND PELVIS WITHOUT CONTRAST TECHNIQUE: Multidetector CT imaging of the abdomen and pelvis was performed following the standard protocol without IV contrast. COMPARISON:  None. FINDINGS: Of note, study is degraded by patient motion. Lower chest: 8 mm right middle lobe pulmonary nodule seen on image 6 series 205. 4 mm right lower lobe  pulmonary nodule identified on the same image. Hepatobiliary: No focal abnormality in the liver on this study without intravenous contrast. No evidence of hepatomegaly. Gallbladder is surgically absent. No intrahepatic or extrahepatic biliary dilation. Pancreas: No focal mass lesion. No dilatation of the main duct. No intraparenchymal cyst. No peripancreatic edema. Spleen: No splenomegaly. No focal mass lesion. Adrenals/Urinary Tract: No adrenal nodule or mass. Low-density lesions seen in the kidneys bilaterally approach water attenuation are likely cysts. Dominant lesion on the right is 2.5 cm and dominant lesion on the left is lower pole measuring 5.1 cm. No overt hydronephrosis in either kidney. No evidence for hydroureter. Small left-sided diverticulum noted in otherwise unremarkable appearing urinary bladder. Stomach/Bowel: Stomach is nondistended. No gastric wall thickening. No evidence of outlet obstruction. Duodenum is normally positioned as  is the ligament of Treitz. No small bowel wall thickening. No small bowel dilatation. The terminal ileum is normal. The appendix is normal. No gross colonic mass. No colonic wall thickening. No substantial diverticular change. Vascular/Lymphatic: There is abdominal aortic atherosclerosis without aneurysm. There is no gastrohepatic or hepatoduodenal ligament lymphadenopathy. No intraperitoneal or retroperitoneal lymphadenopathy. 10 mm short axis upper normal left para-aortic lymph node identified. No pelvic sidewall lymphadenopathy. Reproductive: The prostate gland and seminal vesicles have normal imaging features. Other: No intraperitoneal free fluid. Musculoskeletal: Degenerative changes are noted in the lower lumbar spine. IMPRESSION: 1. No acute abnormality identified. Specifically, no findings to explain the patient's history of abdominal pain with nausea vomiting. 2. 8 mm right middle lobe pulmonary nodule. Non-contrast chest CT at 6-12 months is recommended. If the  nodule is stable at time of repeat CT, then future CT at 18-24 months (from today's scan) is considered optional for low-risk patients, but is recommended for high-risk patients. This recommendation follows the consensus statement: Guidelines for Management of Incidental Pulmonary Nodules Detected on CT Images:From the Fleischner Society 2017; published online before print (10.1148/radiol.1610960454). Probable bilateral renal cysts. Electronically Signed   By: Kennith Center M.D.   On: 01/12/2016 12:07   I have personally reviewed and evaluated these images and lab results as part of my medical decision-making.   EKG Interpretation   Date/Time:  Thursday January 12 2016 08:42:24 EDT Ventricular Rate:  63 PR Interval:    QRS Duration: 132 QT Interval:  431 QTC Calculation: 409 R Axis:   -45 Text Interpretation:  Sinus or ectopic atrial rhythm Supraventricular  bigeminy Short PR interval Left bundle branch block Artifact Abnormal ekg  Confirmed by Gerhard Munch  MD ([id number]) on 01/12/2016 9:12:39 AM     1:07 PM Patient in similar condition. I discussed his case with our gastroenterologist, Dr. Randa Evens. Patient is receiving blood transfusion.    MDM   Elderly man, Bosnian speaking, presents after chest pain, nausea, vomiting, for the past half day. Here the patient is awake and alert, notes that his pain has improved. However, the patient is found to have involvement of 6, substantially down from baseline. Patient is Hemoccult positive. Initial CT reassuring, but with new GI bleed, I discussed his case with our GI team, the patient had initiation of blood transfusion, and he required admission for further evaluation and management.  CRITICAL CARE Performed by: Gerhard Munch Total critical care time: 35 minutes Critical care time was exclusive of separately billable procedures and treating other patients. Critical care was necessary to treat or prevent imminent or life-threatening  deterioration. Critical care was time spent personally by me on the following activities: development of treatment plan with patient and/or surrogate as well as nursing, discussions with consultants, evaluation of patient's response to treatment, examination of patient, obtaining history from patient or surrogate, ordering and performing treatments and interventions, ordering and review of laboratory studies, ordering and review of radiographic studies, pulse oximetry and re-evaluation of patient's condition.   Gerhard Munch, MD 01/12/16 1308

## 2016-01-12 NOTE — ED Notes (Signed)
Pt has completed n bottle of contrast and indicates he cant take any more. Dr. Jeraldine LootsLockwood aware and Ct contacted.

## 2016-01-12 NOTE — ED Notes (Signed)
MD at bedside.Dr. Erenest BlankIkram.

## 2016-01-12 NOTE — Consult Note (Signed)
[facility] Reason for consult: G.I. bleeding Referring Physician: [facility] hospitalist. PCP: Dr. Amil Amen  Harold Jones is an 82 y.o. male.  HPI: he has a history of atrial fibrillation and aortic valve replacement on chronic Xarelto. He apparently has some sort of EP implantable device possibly pacemaker. Does not appear that this is a defibrillator. He is followed by Dr. Lovena Le. He has a history of bioprosthetic aortic valve replacement. He is from [place] and does not speak English his granddaughter serves as an Astronomer.  His other problems include diabetes, atrial fibrillation, history of strokes cerebrovascular disease etc.  Past Medical History  Diagnosis Date  . Hypertension   . Diabetes mellitus   . FIBRILLATION, ATRIAL 08/31/2002    Qualifier: Diagnosis of  By: Radene Ou MD, [place]    . CVA 02/28/2004    Annotation: embolic Qualifier: Diagnosis of  By: Radene Ou MD, [place]    . COGNITIVE DEFICITS DUE CEREBROVASCULAR DISEASE 09/09/2009    Qualifier: Diagnosis of  By: Amil Amen MD, Benjamine Mola    . CORONARY ARTERY DISEASE 02/27/2007    Qualifier: Diagnosis of  By: Radene Ou MD, [place]    . DYSLIPIDEMIA 02/27/2007    Qualifier: Diagnosis of  By: Radene Ou MD, [place]    . Symptomatic bradycardia 08/16/15    [employer] PPM, Dr. Lovena Le  . Aortic valve disorder   . Physical deconditioning   . Chest wall pain following surgery   . Osteoarthritis of left knee   . BPH without obstruction/lower urinary tract symptoms   . Grief   . Neuropathic pain     Past Surgical History  Procedure Laterality Date  . Cardiac surgery    . Ep implantable device N/A 08/16/2015    [facility] PPM, Dr. Lovena Le    History reviewed. No pertinent family history.  Social History:  reports that he has never smoked. He has never used smokeless tobacco. He reports that he does not drink alcohol or use illicit drugs.  Allergies: No Known Allergies  Medications; Prior to  Admission medications   Medication Sig Start Date End Date Taking? Authorizing Provider  acetaminophen (TYLENOL) 325 MG tablet Take 650 mg by mouth 2 (two) times daily.    Yes Historical Provider, MD  atorvastatin (LIPITOR) 10 MG tablet Take 10 mg by mouth daily.   Yes Historical Provider, MD  docusate sodium (COLACE) 100 MG capsule Take 100 mg by mouth 2 (two) times daily.   Yes Historical Provider, MD  furosemide (LASIX) 20 MG tablet Take 20 mg by mouth daily. Reported on 08/17/2015   Yes Historical Provider, MD  gabapentin (NEURONTIN) 300 MG capsule Take 300 mg by mouth 2 (two) times daily.   Yes Historical Provider, MD  glipiZIDE (GLUCOTROL) 5 MG tablet Take 5 mg by mouth daily before breakfast.  11/14/13  Yes Kinnie Feil, MD  lisinopril (PRINIVIL,ZESTRIL) 20 MG tablet Take 20 mg by mouth daily.   Yes Historical Provider, MD  polyethylene glycol (MIRALAX / GLYCOLAX) packet Take 17 g by mouth daily.   Yes Historical Provider, MD  Rivaroxaban (XARELTO) 15 MG TABS tablet Take 15 mg by mouth daily.   Yes Historical Provider, MD  tamsulosin (FLOMAX) 0.4 MG CAPS capsule Take 0.4 mg by mouth daily after supper. 30 minutes after the same meal every day. 02/12/15  Yes Historical Provider, MD   . Derrill Memo ON 01/13/2016] atorvastatin  10 mg Oral Daily  . diatrizoate meglumine-sodium      . docusate sodium  100 mg Oral BID  .  famotidine (PEPCID) IV  20 mg Intravenous Q12H  . gabapentin  300 mg Oral BID  . insulin aspart  0-15 Units Subcutaneous Q4H  . [START ON 01/13/2016] polyethylene glycol  17 g Oral Daily  . tamsulosin  0.4 mg Oral QPC supper   PRN Meds morphine injection, ondansetron **OR** ondansetron (ZOFRAN) IV Results for orders placed or performed during the hospital encounter of 01/12/16 (from the past 48 hour(s))  Comprehensive metabolic panel     Status: Abnormal   Collection Time: 01/12/16  8:41 AM  Result Value Ref Range   Sodium 138 135 - 145 mmol/L   Potassium 4.8 3.5 - 5.1 mmol/L    Chloride 107 101 - 111 mmol/L   CO2 24 22 - 32 mmol/L   Glucose, Bld 178 (H) 65 - 99 mg/dL   BUN 50 (H) 6 - 20 mg/dL   Creatinine, Ser 1.95 (H) 0.61 - 1.24 mg/dL   Calcium 9.1 8.9 - 10.3 mg/dL   Total Protein 6.7 6.5 - 8.1 g/dL   Albumin 3.5 3.5 - 5.0 g/dL   AST 13 (L) 15 - 41 U/L   ALT 13 (L) 17 - 63 U/L   Alkaline Phosphatase 32 (L) 38 - 126 U/L   Total Bilirubin 0.9 0.3 - 1.2 mg/dL   GFR calc non Af Amer 29 (L) >60 mL/min   GFR calc Af Amer 34 (L) >60 mL/min    Comment: (NOTE) The eGFR has been calculated using the CKD EPI equation. This calculation has not been validated in all clinical situations. eGFR's persistently <60 mL/min signify possible Chronic Kidney Disease.    Anion gap 7 5 - 15  Lipase, blood     Status: None   Collection Time: 01/12/16  8:41 AM  Result Value Ref Range   Lipase 19 11 - 51 U/L  CBC WITH DIFFERENTIAL     Status: Abnormal   Collection Time: 01/12/16  8:41 AM  Result Value Ref Range   WBC 6.3 4.0 - 10.5 K/uL   RBC 2.48 (L) 4.22 - 5.81 MIL/uL   Hemoglobin 6.0 (LL) 13.0 - 17.0 g/dL    Comment: REPEATED TO VERIFY CRITICAL RESULT CALLED TO, READ BACK BY AND VERIFIED WITH: MILLIE GAGE,RN AT 3335 01/12/16 BY ZBEECH.    HCT 19.8 (L) 39.0 - 52.0 %   MCV 79.8 78.0 - 100.0 fL   MCH 24.2 (L) 26.0 - 34.0 pg   MCHC 30.3 30.0 - 36.0 g/dL   RDW 14.4 11.5 - 15.5 %   Platelets 185 150 - 400 K/uL   Neutrophils Relative % 72 %   Neutro Abs 4.5 1.7 - 7.7 K/uL   Lymphocytes Relative 19 %   Lymphs Abs 1.2 0.7 - 4.0 K/uL   Monocytes Relative 6 %   Monocytes Absolute 0.4 0.1 - 1.0 K/uL   Eosinophils Relative 2 %   Eosinophils Absolute 0.1 0.0 - 0.7 K/uL   Basophils Relative 0 %   Basophils Absolute 0.0 0.0 - 0.1 K/uL  Troponin I     Status: Abnormal   Collection Time: 01/12/16  8:41 AM  Result Value Ref Range   Troponin I 0.03 (HH) <0.03 ng/mL    Comment: CRITICAL RESULT CALLED TO, READ BACK BY AND VERIFIED WITH: K.PATE,RN [id number] 01/12/16 CLARK,S    Urinalysis, Routine w reflex microscopic (not at [facility])     Status: None   Collection Time: 01/12/16  9:05 AM  Result Value Ref Range   Color, Urine YELLOW YELLOW  APPearance CLEAR CLEAR   Specific Gravity, Urine 1.014 1.005 - 1.030   pH 6.0 5.0 - 8.0   Glucose, UA NEGATIVE NEGATIVE mg/dL   Hgb urine dipstick NEGATIVE NEGATIVE   Bilirubin Urine NEGATIVE NEGATIVE   Ketones, ur NEGATIVE NEGATIVE mg/dL   Protein, ur NEGATIVE NEGATIVE mg/dL   Nitrite NEGATIVE NEGATIVE   Leukocytes, UA NEGATIVE NEGATIVE    Comment: MICROSCOPIC NOT DONE ON URINES WITH NEGATIVE PROTEIN, BLOOD, LEUKOCYTES, NITRITE, OR GLUCOSE <1000 mg/dL.  POC occult blood, ED Provider will collect     Status: Abnormal   Collection Time: 01/12/16  9:54 AM  Result Value Ref Range   Fecal Occult Bld POSITIVE (A) NEGATIVE  Type and screen [place]     Status: None (Preliminary result)   Collection Time: 01/12/16 10:00 AM  Result Value Ref Range   ABO/RH(D) AB POS    Antibody Screen NEG    Sample Expiration 01/15/2016    Unit Number [id number]    Blood Component Type RED CELLS,LR    Unit division 00    Status of Unit ISSUED    Transfusion Status OK TO TRANSFUSE    Crossmatch Result Compatible    Unit Number [id number]    Blood Component Type RED CELLS,LR    Unit division 00    Status of Unit ISSUED    Transfusion Status OK TO TRANSFUSE    Crossmatch Result Compatible   Prepare RBC     Status: None   Collection Time: 01/12/16 10:01 AM  Result Value Ref Range   Order Confirmation ORDER PROCESSED BY BLOOD BANK   CBG monitoring, ED     Status: None   Collection Time: 01/12/16  3:05 PM  Result Value Ref Range   Glucose-Capillary 67 65 - 99 mg/dL    Ct Abdomen Pelvis Wo Contrast  01/12/2016  CLINICAL DATA:  Abdominal pain with nausea vomiting. EXAM: CT ABDOMEN AND PELVIS WITHOUT CONTRAST TECHNIQUE: Multidetector CT imaging of the abdomen and pelvis was performed following the standard  protocol without IV contrast. COMPARISON:  None. FINDINGS: Of note, study is degraded by patient motion. Lower chest: 8 mm right middle lobe pulmonary nodule seen on image 6 series 205. 4 mm right lower lobe pulmonary nodule identified on the same image. Hepatobiliary: No focal abnormality in the liver on this study without intravenous contrast. No evidence of hepatomegaly. Gallbladder is surgically absent. No intrahepatic or extrahepatic biliary dilation. Pancreas: No focal mass lesion. No dilatation of the main duct. No intraparenchymal cyst. No peripancreatic edema. Spleen: No splenomegaly. No focal mass lesion. Adrenals/Urinary Tract: No adrenal nodule or mass. Low-density lesions seen in the kidneys bilaterally approach water attenuation are likely cysts. Dominant lesion on the right is 2.5 cm and dominant lesion on the left is lower pole measuring 5.1 cm. No overt hydronephrosis in either kidney. No evidence for hydroureter. Small left-sided diverticulum noted in otherwise unremarkable appearing urinary bladder. Stomach/Bowel: Stomach is nondistended. No gastric wall thickening. No evidence of outlet obstruction. Duodenum is normally positioned as is the ligament of Treitz. No small bowel wall thickening. No small bowel dilatation. The terminal ileum is normal. The appendix is normal. No gross colonic mass. No colonic wall thickening. No substantial diverticular change. Vascular/Lymphatic: There is abdominal aortic atherosclerosis without aneurysm. There is no gastrohepatic or hepatoduodenal ligament lymphadenopathy. No intraperitoneal or retroperitoneal lymphadenopathy. 10 mm short axis upper normal left para-aortic lymph node identified. No pelvic sidewall lymphadenopathy. Reproductive: The prostate gland and seminal vesicles have  normal imaging features. Other: No intraperitoneal free fluid. Musculoskeletal: Degenerative changes are noted in the lower lumbar spine. IMPRESSION: 1. No acute abnormality  identified. Specifically, no findings to explain the patient's history of abdominal pain with nausea vomiting. 2. 8 mm right middle lobe pulmonary nodule. Non-contrast chest CT at 6-12 months is recommended. If the nodule is stable at time of repeat CT, then future CT at 18-24 months (from today's scan) is considered optional for low-risk patients, but is recommended for high-risk patients. This recommendation follows the consensus statement: Guidelines for Management of Incidental Pulmonary Nodules Detected on CT Images:From the Fleischner Society 2017; published online before print (10.1148/radiol.7482707867). Probable bilateral renal cysts. Electronically Signed   By: Misty Stanley M.D.   On: 01/12/2016 12:07               Blood pressure 124/46, pulse 52, temperature 97.5 F (36.4 C), temperature source Oral, resp. rate 18, height _0  (1.778 m), weight 190 lb (86.183 kg), SpO2 100 %.  Physical exam:   General-- pleasant white male who does not speak [place] ENT-- nonicteric  Neck-- without lymphadenopathy  Heart-- irregular  Lungs-- clear Abdomen-- soft and nontender     Assessment: 1. G.I. bleed. Hemoglobin 6.0 and stools trace positive. Could have ulcer or other lesion. His anticoagulation is on hold. 2. AVR with bioprosthetic valve 3. Atrial fibrillation with pacemaker   Plan: 1. Will proceed with EGD tomorrow. Long discussion about the procedure with his granddaughter and she will explain it to him when she comes in to visit tonight. Procedure will likely be done at 2 o'clock. Will allow clear liquids.   Christian Borgerding JR,Jannely Henthorn L 01/12/2016, 4:58 PM   This note was created using voice recognition software and minor errors may Have occurred unintentionally. Pager: [phone] If no answer or after hours call [phone]

## 2016-01-12 NOTE — H&P (Signed)
History and Physical  Harold Jones ZOX:[id number] DOB: 03/02/1928 DOA: 01/12/2016   PCP: Harold MansonMULBERRY,ELIZABETH, MD   Patient coming from: Home   Chief Complaint: abd pain, nausea   HPI: Harold Jones is a 82 y.o. male with medical history significant for Afib, aortic valve replacement on Xarelto being admitted for acute blood loss anemia. History taken from care team and chart as well as conversation with patient via telephone interpreter. He is very hard of hearing, so interview is limited. The patient tells me he started having abdominal pain and nausea last night, vomited a few times but never saw any blood or dark material. Currently without abdominal pain or nausea. Denies fevers or syncope.  ED Course: Somewhat hypotensive. Hb 6. Rectal exam with positive guaiac. [facility] called to admit.  Review of Systems: Please see HPI for pertinent positives and negatives. A complete 10 system review of systems are otherwise negative.  Past Medical History  Diagnosis Date  . Hypertension   . Diabetes mellitus   . FIBRILLATION, ATRIAL 08/31/2002    Qualifier: Diagnosis of  By: Barbaraann Barthelankins MD, [place]    . CVA 02/28/2004    Annotation: embolic Qualifier: Diagnosis of  By: Barbaraann Barthelankins MD, [place]    . COGNITIVE DEFICITS DUE CEREBROVASCULAR DISEASE 09/09/2009    Qualifier: Diagnosis of  By: Delrae AlfredMulberry MD, Lanora ManisElizabeth    . CORONARY ARTERY DISEASE 02/27/2007    Qualifier: Diagnosis of  By: Barbaraann Barthelankins MD, [place]    . DYSLIPIDEMIA 02/27/2007    Qualifier: Diagnosis of  By: Barbaraann Barthelankins MD, [place]    . Symptomatic bradycardia 08/16/15    [employer] PPM, Dr. Ladona Ridgelaylor  . Aortic valve disorder   . Physical deconditioning   . Chest wall pain following surgery   . Osteoarthritis of left knee   . BPH without obstruction/lower urinary tract symptoms   . Grief   . Neuropathic pain    Past Surgical History  Procedure Laterality Date  . Cardiac surgery    . Ep implantable device N/A 08/16/2015    [employer] P[employer], Dr. Ladona Ridgelaylor    Social History:  reports that he has never smoked. He has never used smokeless tobacco. He reports that he does not drink alcohol or use illicit drugs.   No Known Allergies  History reviewed. No pertinent family history.   Prior to Admission medications   Medication Sig Start Date End Date Taking? Authorizing Provider  acetaminophen (TYLENOL) 325 MG tablet Take 650 mg by mouth 2 (two) times daily.    Yes Historical Provider, MD  atorvastatin (LIPITOR) 10 MG tablet Take 10 mg by mouth daily.   Yes Historical Provider, MD  docusate sodium (COLACE) 100 MG capsule Take 100 mg by mouth 2 (two) times daily.   Yes Historical Provider, MD  furosemide (LASIX) 20 MG tablet Take 20 mg by mouth daily. Reported on 08/17/2015   Yes Historical Provider, MD  gabapentin (NEURONTIN) 300 MG capsule Take 300 mg by mouth 2 (two) times daily.   Yes Historical Provider, MD  glipiZIDE (GLUCOTROL) 5 MG tablet Take 5 mg by mouth daily before breakfast.  11/14/13  Yes Esperanza SheetsUlugbek N Buriev, MD  lisinopril (PRINIVIL,ZESTRIL) 20 MG tablet Take 20 mg by mouth daily.   Yes Historical Provider, MD  polyethylene glycol (MIRALAX / GLYCOLAX) packet Take 17 g by mouth daily.   Yes Historical Provider, MD  Rivaroxaban (XARELTO) 15 MG TABS tablet Take 15 mg by mouth daily.   Yes Historical Provider, MD  tamsulosin (FLOMAX) 0.4 MG  CAPS capsule Take 0.4 mg by mouth daily after supper. 30 minutes after the same meal every day. 02/12/15  Yes Historical Provider, MD    Physical Exam: BP 118/53 mmHg  Pulse 52  Temp(Src) 97.7 F (36.5 C) (Oral)  Resp 16  Ht 5\' 10"  (1.778 m)  Wt 86.183 kg (190 lb)  BMI 27.26 kg/m2  SpO2 100%  General:  Alert, oriented, calm, in no acute distress. Hard of hearing. Looks pale. Eyes: pupils round and reactive to light and accomodation, clear sclerea Neck: supple, no masses, trachea mildline  Cardiovascular: RRR, no murmurs or rubs, no peripheral edema  Respiratory: clear  to auscultation bilaterally, no wheezes, no crackles  Abdomen: soft, nontender, nondistended, normal bowel tones heard  Skin: dry, no rashes  Musculoskeletal: no joint effusions, normal range of motion  Psychiatric: appropriate affect, normal speech  Neurologic: extraocular muscles intact, clear speech, moving all extremities with intact sensorium           Labs on Admission:  Basic Metabolic Panel:  Recent Labs Lab 01/12/16 0841  NA 138  K 4.8  CL 107  CO2 24  GLUCOSE 178*  BUN 50*  CREATININE 1.95*  CALCIUM 9.1   Liver Function Tests:  Recent Labs Lab 01/12/16 0841  AST 13*  ALT 13*  ALKPHOS 32*  BILITOT 0.9  PROT 6.7  ALBUMIN 3.5    Recent Labs Lab 01/12/16 0841  LIPASE 19   No results for input(s): AMMONIA in the last 168 hours. CBC:  Recent Labs Lab 01/12/16 0841  WBC 6.3  NEUTROABS 4.5  HGB 6.0*  HCT 19.8*  MCV 79.8  PLT 185   Cardiac Enzymes:  Recent Labs Lab 01/12/16 0841  TROPONINI 0.03*    BNP (last 3 results) No results for input(s): BNP in the last 8760 hours.  ProBNP (last 3 results) No results for input(s): PROBNP in the last 8760 hours.  CBG: No results for input(s): GLUCAP in the last 168 hours.  Radiological Exams on Admission: Ct Abdomen Pelvis Wo Contrast  01/12/2016  CLINICAL DATA:  Abdominal pain with nausea vomiting. EXAM: CT ABDOMEN AND PELVIS WITHOUT CONTRAST TECHNIQUE: Multidetector CT imaging of the abdomen and pelvis was performed following the standard protocol without IV contrast. COMPARISON:  None. FINDINGS: Of note, study is degraded by patient motion. Lower chest: 8 mm right middle lobe pulmonary nodule seen on image 6 series 205. 4 mm right lower lobe pulmonary nodule identified on the same image. Hepatobiliary: No focal abnormality in the liver on this study without intravenous contrast. No evidence of hepatomegaly. Gallbladder is surgically absent. No intrahepatic or extrahepatic biliary dilation. Pancreas:  No focal mass lesion. No dilatation of the main duct. No intraparenchymal cyst. No peripancreatic edema. Spleen: No splenomegaly. No focal mass lesion. Adrenals/Urinary Tract: No adrenal nodule or mass. Low-density lesions seen in the kidneys bilaterally approach water attenuation are likely cysts. Dominant lesion on the right is 2.5 cm and dominant lesion on the left is lower pole measuring 5.1 cm. No overt hydronephrosis in either kidney. No evidence for hydroureter. Small left-sided diverticulum noted in otherwise unremarkable appearing urinary bladder. Stomach/Bowel: Stomach is nondistended. No gastric wall thickening. No evidence of outlet obstruction. Duodenum is normally positioned as is the ligament of Treitz. No small bowel wall thickening. No small bowel dilatation. The terminal ileum is normal. The appendix is normal. No gross colonic mass. No colonic wall thickening. No substantial diverticular change. Vascular/Lymphatic: There is abdominal aortic atherosclerosis without aneurysm. There is  no gastrohepatic or hepatoduodenal ligament lymphadenopathy. No intraperitoneal or retroperitoneal lymphadenopathy. 10 mm short axis upper normal left para-aortic lymph node identified. No pelvic sidewall lymphadenopathy. Reproductive: The prostate gland and seminal vesicles have normal imaging features. Other: No intraperitoneal free fluid. Musculoskeletal: Degenerative changes are noted in the lower lumbar spine. IMPRESSION: 1. No acute abnormality identified. Specifically, no findings to explain the patient's history of abdominal pain with nausea vomiting. 2. 8 mm right middle lobe pulmonary nodule. Non-contrast chest CT at 6-12 months is recommended. If the nodule is stable at time of repeat CT, then future CT at 18-24 months (from today's scan) is considered optional for low-risk patients, but is recommended for high-risk patients. This recommendation follows the consensus statement: Guidelines for Management of  Incidental Pulmonary Nodules Detected on CT Images:From the Fleischner Society 2017; published online before print (10.1148/radiol.1610960454). Probable bilateral renal cysts. Electronically Signed   By: Kennith Center M.D.   On: 01/12/2016 12:07    Assessment/Plan Present on Admission:  . GI bleed - with positive rectal exam, he denies coffee grounds. Don't strongly suspect upper GI bleed but will start on IV acid reducer for now. Telemetry floor, avoid blood thinners. GI has been consulted by ER MD. Clear liquid diet. Transfusing 2 units of blood. . Dyslipidemia . Essential hypertension - holding home antihypertensives due to GIB and hypotension. Marland Kitchen FIBRILLATION, ATRIAL - slow rate currently, will hold xarelto due to GIB Active Problems:   Dyslipidemia   Essential hypertension   FIBRILLATION, ATRIAL   H/O aortic valve replacement, bioprosthetic so okay to hold [facility] for now   Diabetes mellitus, type 2 (HCC)   History of stroke   S/P aortic valve replacement with bioprosthetic valve   GI bleed   GIB (gastrointestinal bleeding)   DVT prophylaxis: SCDs    Code Status: FULL   Family Communication: None   Disposition Plan: Telemetry, likely to home at d/c in 2-3 days.   Consults called: GI    Admission status: Inpt tele.   Time spent: 53 minutes  Diondre Pulis Vergie Living MD [facility] Pager [phone]  If 7PM-7AM, please contact night-coverage [email or web address] Password [email or web address]  01/12/2016, 2:45 PM

## 2016-01-12 NOTE — ED Notes (Signed)
Pt taking po contrast. Indicares he is becoming more nauseated. Declines further contrast. MD will be informed.

## 2016-01-13 ENCOUNTER — Encounter (HOSPITAL_COMMUNITY): Payer: Self-pay | Admitting: Certified Registered"

## 2016-01-13 ENCOUNTER — Encounter (HOSPITAL_COMMUNITY)
Admission: EM | Disposition: A | Discharge status: Skilled Nursing Facility | Payer: Self-pay | Source: Home / Self Care | Attending: Internal Medicine

## 2016-01-13 LAB — HEMOGLOBIN A1C
Hgb A1c MFr Bld: 8.3 % — ABNORMAL HIGH (ref 4.8–5.6)
MEAN PLASMA GLUCOSE: 192 mg/dL

## 2016-01-13 LAB — BASIC METABOLIC PANEL
ANION GAP: 8 (ref 5–15)
BUN: 40 mg/dL — ABNORMAL HIGH (ref 6–20)
CALCIUM: 8.4 mg/dL — AB (ref 8.9–10.3)
CHLORIDE: 108 mmol/L (ref 101–111)
CO2: 21 mmol/L — AB (ref 22–32)
Creatinine, Ser: 1.59 mg/dL — ABNORMAL HIGH (ref 0.61–1.24)
GFR calc non Af Amer: 38 mL/min — ABNORMAL LOW (ref >60)
GFR, EST AFRICAN AMERICAN: 44 mL/min — AB (ref >60)
Glucose, Bld: 113 mg/dL — ABNORMAL HIGH (ref 65–99)
POTASSIUM: 5.3 mmol/L — AB (ref 3.5–5.1)
Sodium: 137 mmol/L (ref 135–145)

## 2016-01-13 LAB — CBC
HCT: 23.2 % — ABNORMAL LOW (ref 39.0–52.0)
HEMOGLOBIN: 7.3 g/dL — AB (ref 13.0–17.0)
MCH: 25.4 pg — AB (ref 26.0–34.0)
MCHC: 31.5 g/dL (ref 30.0–36.0)
MCV: 80.8 fL (ref 78.0–100.0)
Platelets: 176 10*3/uL (ref 150–400)
RBC: 2.87 MIL/uL — AB (ref 4.22–5.81)
RDW: 14.2 % (ref 11.5–15.5)
WBC: 6.5 10*3/uL (ref 4.0–10.5)

## 2016-01-13 LAB — GLUCOSE, CAPILLARY
GLUCOSE-CAPILLARY: 115 mg/dL — AB (ref 65–99)
GLUCOSE-CAPILLARY: 125 mg/dL — AB (ref 65–99)
GLUCOSE-CAPILLARY: 128 mg/dL — AB (ref 65–99)
GLUCOSE-CAPILLARY: 156 mg/dL — AB (ref 65–99)
GLUCOSE-CAPILLARY: 94 mg/dL (ref 65–99)
Glucose-Capillary: 119 mg/dL — ABNORMAL HIGH (ref 65–99)

## 2016-01-13 LAB — PREPARE RBC (CROSSMATCH)

## 2016-01-13 SURGERY — CANCELLED PROCEDURE

## 2016-01-13 MED ORDER — LORAZEPAM 1 MG PO TABS
1.0000 mg | ORAL_TABLET | ORAL | Status: DC | PRN
  Administered 2016-01-13 – 2016-01-16 (×4): 1 mg via ORAL
  Filled 2016-01-13 (×4): qty 1

## 2016-01-13 MED ORDER — HALOPERIDOL LACTATE 5 MG/ML IJ SOLN
2.0000 mg | Freq: Once | INTRAMUSCULAR | Status: AC
  Administered 2016-01-13: 2 mg via INTRAMUSCULAR
  Filled 2016-01-13: qty 1

## 2016-01-13 MED ORDER — LORAZEPAM 2 MG/ML IJ SOLN
1.0000 mg | INTRAMUSCULAR | Status: DC | PRN
  Administered 2016-01-13: 1 mg via INTRAMUSCULAR

## 2016-01-13 MED ORDER — SODIUM CHLORIDE 0.9 % IV SOLN: Freq: Once | INTRAVENOUS | Status: DC

## 2016-01-13 MED ORDER — LORAZEPAM 2 MG/ML IJ SOLN
INTRAMUSCULAR | Status: AC
  Filled 2016-01-13: qty 1

## 2016-01-13 NOTE — Progress Notes (Signed)
Called language line, [place], [place].  Put phone to pts ear, smaking at phone, interpreter stated he was speaking vugarities.  Patient would not let me put the phone to his ear.

## 2016-01-13 NOTE — Progress Notes (Signed)
Per RN Inetta Fermoina, pt was able to release himself from both wrist restraints and pulled out his IV. Restraints reapplied.  Paged MD to make him aware; awaiting return page. Pt lying in bed. Will continue to monitor.

## 2016-01-13 NOTE — Progress Notes (Signed)
PROGRESS NOTE  Conlee Jacklynn Jones ZOX:[id number] DOB: 05/31/1928 DOA: 01/12/2016 PCP: No primary care provider on file.  HPI/Recap of past 24 hours: Harold Jones is a 82 y.o. male with medical history significant for Afib, aortic valve replacement on Xarelto being admitted for acute blood loss anemia.   This AM he has been NPO awaiting EGD, but around 10am he became very agitated and combative. No change after IV ativan, security was called and he is more calm now.   Assessment/Plan: . GI bleed - with positive rectal exam, he denies coffee grounds. Don't strongly suspect upper GI bleed but will start on IV acid reducer for now. Telemetry floor, avoid blood thinners. GI has seen plans EGD today, he has been NPO pMN. Marland Kitchen. Dyslipidemia . Essential hypertension - holding home antihypertensives due to GIB and hypotension. Marland Kitchen. FIBRILLATION, ATRIAL - slow rate currently, will hold xarelto due to GIB Agitation - poss hospital delirium, worsened by the fact he does not speak the language and no family is present. We have attempted communication via phone interpreter but he is very hard of hearing and we can only communicate with his close attention. - IM haldol 2mg  given - soft waist belt restraint - I have asked family to be present the rest of the day, to help with reorientation, and to provide consent for EGD  Active Problems:  Dyslipidemia  Essential hypertension  FIBRILLATION, ATRIAL  H/O aortic valve replacement, bioprosthetic so [facility]  Diabetes mellitus, type 2 (HCC)  History of stroke  S/P aortic valve replacement with bioprosthetic valve  GI bleed  GIB (gastrointestinal bleeding) Code Status: FULL   Family Communication: None present, RN has been in communication. They have been asked to come to the hospital.   Disposition Plan: Hoping for EGD today, then further workup to follow. Likely d/c to home when ready.    Consultants:  GI    Procedures:  EGD planned today 7/14   Antimicrobials:  None    Objective: Filed Vitals:   01/12/16 1915 01/12/16 2008 01/13/16 0551 01/13/16 0848  BP: 143/71 133/49 127/49 116/46  Pulse: 52 53 59 53  Temp: 97.5 F (36.4 C) 97.7 F (36.5 C) 98.3 F (36.8 C) 97.5 F (36.4 C)  TempSrc: Oral Oral Oral Oral  Resp: 17 18 18 18   Height:      Weight:  94.4 kg (208 lb 1.8 oz)    SpO2: 100% 98% 99% 97%    Intake/Output Summary (Last 24 hours) at 01/13/16 1420 Last data filed at 01/13/16 0900  Gross per 24 hour  Intake    885 ml  Output    900 ml  Net    -15 ml   Filed Weights   01/12/16 0851 01/12/16 2008  Weight: 86.183 kg (190 lb) 94.4 kg (208 lb 1.8 oz)    Exam: General:  Alert, oriented, calm at this moment Eyes: EOMI Cardiovascular: no peripheral edema  Respiratory: no respiratory distress Skin: no rashes  Musculoskeletal: no joint effusions, normal range of motion  Psychiatric: angry appearing, swinging at any staff within arm's reach Neurologic: extraocular muscles intact, clear speech, moving all extremities   Data Reviewed: CBC:  Recent Labs Lab 01/12/16 0841 01/13/16 0414  WBC 6.3 6.5  NEUTROABS 4.5  --   HGB 6.0* 7.3*  HCT 19.8* 23.2*  MCV 79.8 80.8  PLT 185 176   Basic Metabolic Panel:  Recent Labs Lab 01/12/16 0841 01/13/16 0414  NA 138 137  K 4.8 5.3*  CL 107 108  CO2 24 21*  GLUCOSE 178* 113*  BUN 50* 40*  CREATININE 1.95* 1.59*  CALCIUM 9.1 8.4*   GFR: Estimated Creatinine Clearance: 38.5 mL/min (by C-G formula based on Cr of 1.59). Liver Function Tests:  Recent Labs Lab 01/12/16 0841  AST 13*  ALT 13*  ALKPHOS 32*  BILITOT 0.9  PROT 6.7  ALBUMIN 3.5    Recent Labs Lab 01/12/16 0841  LIPASE 19   No results for input(s): AMMONIA in the last 168 hours. Coagulation Profile: No results for input(s): INR, PROTIME in the last 168 hours. Cardiac Enzymes:  Recent Labs Lab 01/12/16 0841  TROPONINI 0.03*   BNP  (last 3 results) No results for input(s): PROBNP in the last 8760 hours. HbA1C:  Recent Labs  01/12/16 0841  HGBA1C 8.3*   CBG:  Recent Labs Lab 01/12/16 2011 01/13/16 0028 01/13/16 0414 01/13/16 0752 01/13/16 1144  GLUCAP 119* 94 119* 125* 115*   Lipid Profile: No results for input(s): CHOL, HDL, LDLCALC, TRIG, CHOLHDL, LDLDIRECT in the last 72 hours. Thyroid Function Tests: No results for input(s): TSH, T4TOTAL, FREET4, T3FREE, THYROIDAB in the last 72 hours. Anemia Panel: No results for input(s): VITAMINB12, FOLATE, FERRITIN, TIBC, IRON, RETICCTPCT in the last 72 hours. Urine analysis:    Component Value Date/Time   COLORURINE YELLOW 01/12/2016 0905   APPEARANCEUR CLEAR 01/12/2016 0905   LABSPEC 1.014 01/12/2016 0905   PHURINE 6.0 01/12/2016 0905   GLUCOSEU NEGATIVE 01/12/2016 0905   HGBUR NEGATIVE 01/12/2016 0905   BILIRUBINUR NEGATIVE 01/12/2016 0905   KETONESUR NEGATIVE 01/12/2016 0905   PROTEINUR NEGATIVE 01/12/2016 0905   UROBILINOGEN 0.2 03/01/2015 1940   NITRITE NEGATIVE 01/12/2016 0905   LEUKOCYTESUR NEGATIVE 01/12/2016 0905   Sepsis Labs: (procalcitonin:4,lacticidven:4)  ) Recent Results (from the past 240 hour(s))  MRSA PCR Screening     Status: None   Collection Time: 01/12/16  4:23 PM  Result Value Ref Range Status   MRSA by PCR NEGATIVE NEGATIVE Final    Comment:        The GeneXpert MRSA Assay (FDA approved for NASAL specimens only), is one component of a comprehensive MRSA colonization surveillance program. It is not intended to diagnose MRSA infection nor to guide or monitor treatment for MRSA infections.       Studies: No results found.  Scheduled Meds: . sodium chloride   Intravenous Once  . atorvastatin  10 mg Oral Daily  . docusate sodium  100 mg Oral BID  . famotidine (PEPCID) IV  20 mg Intravenous Q12H  . gabapentin  300 mg Oral BID  . insulin aspart  0-15 Units Subcutaneous Q4H  . LORazepam      .  polyethylene glycol  17 g Oral Daily  . tamsulosin  0.4 mg Oral QPC supper    Continuous Infusions: . sodium chloride 125 mL/hr at 01/12/16 1914     LOS: 1 day   Time spent: 20 minutes  Mir Vergie Living, MD [facility] Pager [phone]  If 7PM-7AM, please contact night-coverage [email or web address] Password [email or web address] 01/13/2016, 2:20 PM

## 2016-01-13 NOTE — Patient Care Conference (Signed)
Spoke with Dr. Erenest BlankIkram, give Ativan 1 mg IM, gave left deltoid.  Wrapped IV left forearm.  Advised Dr. Erenest BlankIkram could not give blood at this time.  He stated it could wait not emergent.  Called Leka granddaughter, Dr. Erenest BlankIkram asked for daughter and Lew DawesLeka granddaughter to come to hospital so consent could be obtained for EGD procedure and to help pts agitation.

## 2016-01-13 NOTE — Progress Notes (Signed)
   01/13/16 1100  Clinical Encounter Type  Visited With Patient  Visit Type Initial  Referral From Social work  Consult/Referral To Chaplain  Spiritual Encounters  Spiritual Needs Emotional  Stress Factors  Patient Stress Factors Health changes;Lack of knowledge  Chaplain rounding, acknowledged patient wave at the door, patient unable to speak any English, but was receptive to blessing. Spoke with nurse, offered any support needed as necessary.

## 2016-01-13 NOTE — Progress Notes (Signed)
[facility] PROGRESS NOTE Subjective patient presented with profound anemia and hemoccult positive stool. He received a transfusion and it was arranged for an elective diagnostic EGD for today. This had been discussed with his family. He said previous aortic valve replacement had been on Xarelto and has atrial fibrillation. He was not having severe bleeding. This morning prior to his EGD he became extremely agitated and combative pulled out his IVs and required restraints. He has received Ativan and Haldol and has remained agitated. The EGD was canceled.  Objective: Vital signs in last 24 hours: Temp:  [97.5 F (36.4 C)-98.3 F (36.8 C)] 97.5 F (36.4 C) (07/14 0848) Pulse Rate:  [50-59] 53 (07/14 0848) Resp:  [17-20] 18 (07/14 0848) BP: (116-143)/(43-71) 116/46 mmHg (07/14 0848) SpO2:  [97 %-100 %] 97 % (07/14 0848) Weight:  [94.4 kg (208 lb 1.8 oz)] 94.4 kg (208 lb 1.8 oz) (07/13 2008)    Intake/Output from previous day: 07/13 0701 - 07/14 0700 In: 885 [P.O.:550; Blood:335] Out: 900 [Urine:900] Intake/Output this shift:    PE: General-- patient restraint in bed and yelling in [place]. No family is currently in the room.  Abdomen-- nondistended and grossly nontender. He was yelling at me during the exam and attempting to grab my hands as I palpated his abdomen.  Lab Results:  Recent Labs  01/12/16 0841 01/13/16 0414  WBC 6.3 6.5  HGB 6.0* 7.3*  HCT 19.8* 23.2*  PLT 185 176   BMET  Recent Labs  01/12/16 0841 01/13/16 0414  NA 138 137  K 4.8 5.3*  CL 107 108  CO2 24 21*  CREATININE 1.95* 1.59*   LFT  Recent Labs  01/12/16 0841  PROT 6.7  AST 13*  ALT 13*  ALKPHOS 32*  BILITOT 0.9   PT/INR No results for input(s): LABPROT, INR in the last 72 hours. PANCREAS  Recent Labs  01/12/16 0841  LIPASE 19         Studies/Results: Ct Abdomen Pelvis Wo Contrast  01/12/2016  CLINICAL DATA:  Abdominal pain with nausea vomiting. EXAM: CT  ABDOMEN AND PELVIS WITHOUT CONTRAST TECHNIQUE: Multidetector CT imaging of the abdomen and pelvis was performed following the standard protocol without IV contrast. COMPARISON:  None. FINDINGS: Of note, study is degraded by patient motion. Lower chest: 8 mm right middle lobe pulmonary nodule seen on image 6 series 205. 4 mm right lower lobe pulmonary nodule identified on the same image. Hepatobiliary: No focal abnormality in the liver on this study without intravenous contrast. No evidence of hepatomegaly. Gallbladder is surgically absent. No intrahepatic or extrahepatic biliary dilation. Pancreas: No focal mass lesion. No dilatation of the main duct. No intraparenchymal cyst. No peripancreatic edema. Spleen: No splenomegaly. No focal mass lesion. Adrenals/Urinary Tract: No adrenal nodule or mass. Low-density lesions seen in the kidneys bilaterally approach water attenuation are likely cysts. Dominant lesion on the right is 2.5 cm and dominant lesion on the left is lower pole measuring 5.1 cm. No overt hydronephrosis in either kidney. No evidence for hydroureter. Small left-sided diverticulum noted in otherwise unremarkable appearing urinary bladder. Stomach/Bowel: Stomach is nondistended. No gastric wall thickening. No evidence of outlet obstruction. Duodenum is normally positioned as is the ligament of Treitz. No small bowel wall thickening. No small bowel dilatation. The terminal ileum is normal. The appendix is normal. No gross colonic mass. No colonic wall thickening. No substantial diverticular change. Vascular/Lymphatic: There is abdominal aortic atherosclerosis without aneurysm. There is no gastrohepatic or hepatoduodenal ligament lymphadenopathy. No  intraperitoneal or retroperitoneal lymphadenopathy. 10 mm short axis upper normal left para-aortic lymph node identified. No pelvic sidewall lymphadenopathy. Reproductive: The prostate gland and seminal vesicles have normal imaging features. Other: No  intraperitoneal free fluid. Musculoskeletal: Degenerative changes are noted in the lower lumbar spine. IMPRESSION: 1. No acute abnormality identified. Specifically, no findings to explain the patient's history of abdominal pain with nausea vomiting. 2. 8 mm right middle lobe pulmonary nodule. Non-contrast chest CT at 6-12 months is recommended. If the nodule is stable at time of repeat CT, then future CT at 18-24 months (from today's scan) is considered optional for low-risk patients, but is recommended for high-risk patients. This recommendation follows the consensus statement: Guidelines for Management of Incidental Pulmonary Nodules Detected on CT Images:From the Fleischner Society 2017; published online before print (10.1148/radiol.1610960454). Probable bilateral renal cysts. Electronically Signed   By: Kennith Center M.D.   On: 01/12/2016 12:07    Medications: I have reviewed the patient's current medications.  Assessment/Plan: 1. Anemia with heme positive stools. The patient has been on anticoagulation and I feel needs to have at least his upper G.I. track evaluated for significant lesion contributing to his blood loss. It is not clear what is causing his acute change in mental state. I think once this is better controlled we need to again consider going ahead with an elective diagnostic EGD. My partner Dr. Mikey College will evaluate the patient tomorrow and decide if he is an acceptable condition to reconsider EGD this weekend.   Navdeep Halt JR,Dale Ribeiro L 01/13/2016, 3:18 PM  This note was created using voice recognition software. Minor errors may Have occurred unintentionally.  Pager: [phone] If no answer or after hours call [phone]

## 2016-01-13 NOTE — Progress Notes (Signed)
Paged Dr. Erenest BlankIkram, pts EGD cancelled may he have a diet

## 2016-01-13 NOTE — Progress Notes (Signed)
Received return call from Dr. Kirby CriglerIkramullah in regards to replacing PIV. Per Dr. Kirby CriglerIkramullah it is okay to hold off on placing IV until pt is more calm. Per MD order to transfuse PRBC will be discontinued for today and readdressed in the morning. This RN will inform oncoming night shift RN.

## 2016-01-13 NOTE — Progress Notes (Signed)
Called language line [phone], interpreter ID [phone]. [place].  [place]  Pt in not in any disrtress, co small amt stomach pain.  Pt is HOH.  Pt NPO for EGD @ 1300.

## 2016-01-13 NOTE — Progress Notes (Signed)
Pt pulled out IV in LAC. Pt pulled off tele monitor and refuses to have it replaced.

## 2016-01-13 NOTE — Progress Notes (Signed)
Pt agitated, co stomach pain, called granddaughter Harold Jones who is speaking with him over the phone to attempt to calm him down.

## 2016-01-13 NOTE — Progress Notes (Signed)
EGD cancelled for today.  Consent had been obtained, Dr. Carman ChingJames Edwards explained procedure to granddaughter over the phone and is satisfied with explanation of procedure.  Daughter is next of kin and gave signed consent and phone consent for procedure.  Daughter wanted to give consent today because she may not be able to come back to the hospital tomorrow.

## 2016-01-13 NOTE — Progress Notes (Signed)
Granddaughter Harold Jones stated she would call her mother and they would come to hospital.

## 2016-01-14 DIAGNOSIS — K922 Gastrointestinal hemorrhage, unspecified: Secondary | ICD-10-CM | POA: Insufficient documentation

## 2016-01-14 LAB — CBC WITH DIFFERENTIAL/PLATELET
BASOS ABS: 0 10*3/uL (ref 0.0–0.1)
Basophils Relative: 0 %
EOS ABS: 0 10*3/uL (ref 0.0–0.7)
EOS PCT: 0 %
HCT: 26.6 % — ABNORMAL LOW (ref 39.0–52.0)
Hemoglobin: 8.1 g/dL — ABNORMAL LOW (ref 13.0–17.0)
Lymphocytes Relative: 17 %
Lymphs Abs: 1.5 10*3/uL (ref 0.7–4.0)
MCH: 24.5 pg — ABNORMAL LOW (ref 26.0–34.0)
MCHC: 30.5 g/dL (ref 30.0–36.0)
MCV: 80.4 fL (ref 78.0–100.0)
Monocytes Absolute: 0.9 10*3/uL (ref 0.1–1.0)
Monocytes Relative: 9 %
Neutro Abs: 6.6 10*3/uL (ref 1.7–7.7)
Neutrophils Relative %: 73 %
PLATELETS: 200 10*3/uL (ref 150–400)
RBC: 3.31 MIL/uL — AB (ref 4.22–5.81)
RDW: 14.3 % (ref 11.5–15.5)
WBC: 9 10*3/uL (ref 4.0–10.5)

## 2016-01-14 LAB — GLUCOSE, CAPILLARY
GLUCOSE-CAPILLARY: 128 mg/dL — AB (ref 65–99)
GLUCOSE-CAPILLARY: 134 mg/dL — AB (ref 65–99)
GLUCOSE-CAPILLARY: 149 mg/dL — AB (ref 65–99)
Glucose-Capillary: 142 mg/dL — ABNORMAL HIGH (ref 65–99)
Glucose-Capillary: 164 mg/dL — ABNORMAL HIGH (ref 65–99)
Glucose-Capillary: 122 mg/dL — ABNORMAL HIGH (ref 65–99)

## 2016-01-14 LAB — BASIC METABOLIC PANEL
Anion gap: 9 (ref 5–15)
BUN: 28 mg/dL — AB (ref 6–20)
CO2: 17 mmol/L — ABNORMAL LOW (ref 22–32)
CREATININE: 1.47 mg/dL — AB (ref 0.61–1.24)
Calcium: 8.7 mg/dL — ABNORMAL LOW (ref 8.9–10.3)
Chloride: 111 mmol/L (ref 101–111)
GFR calc Af Amer: 48 mL/min — ABNORMAL LOW (ref >60)
GFR, EST NON AFRICAN AMERICAN: 41 mL/min — AB (ref >60)
Glucose, Bld: 169 mg/dL — ABNORMAL HIGH (ref 65–99)
POTASSIUM: 3.9 mmol/L (ref 3.5–5.1)
SODIUM: 137 mmol/L (ref 135–145)

## 2016-01-14 MED ORDER — LISINOPRIL 10 MG PO TABS
10.0000 mg | ORAL_TABLET | Freq: Every day | ORAL | Status: DC
Start: 2016-01-14 — End: 2016-01-16
  Administered 2016-01-14 – 2016-01-15 (×2): 10 mg via ORAL
  Filled 2016-01-14 (×2): qty 1

## 2016-01-14 MED ORDER — SODIUM CHLORIDE 0.9 % IV SOLN: INTRAVENOUS | Status: DC

## 2016-01-14 NOTE — Progress Notes (Signed)
PROGRESS NOTE    Harold Jones  ZOX:[id number] DOB: [birth year] DOA: 01/12/2016    Brief Narrative:  Harold Jones is a 82 y.o. male with medical history significant for Afib, aortic valve replacement on Xarelto being admitted for acute blood loss anemia.   7/14 he was awaiting EGD and had an acute episode of agitation and EGD was post-poned.  Per note, GI to re-evaluate over the weekend for procedure.    Assessment & Plan: GI Bleeding with acute blood loss anemia - DDx includes upper source such as an ulcer or gastritis and lower sources such as AVM, diverticular disease - IV Pepcid is ongoing - GI consult, will contact them today about family desire to proceed with EGD - Avoid blood thinners - NPO at midnight - NS at 125cc/hr  Dyslipidemia - Continue home atorvastatin    Essential hypertension - SBP trending 110s  -160s - Currently only on home tamsulosin - Appears to have some baseline CKD - Will start back home lisinopril at half dose ( )  Atrial fibrillation - Heart rate ranging 50s-100s - He is not on a beta blocker or CCB per home meds - Monitor, goal would be HR < 110 - Xarelto held in the setting of bleeding  S/P aortic valve replacement with bioprosthetic valve - Xarelto held in the setting of bleeding, likely okay given it is a bioprosthetic valve  Diabetes mellitus, type 2  - BS ranging 140s-160s - Home glipizide on hold - Continue SSI    History of stroke - Good BP and sugar control while in house - Holding some home BP medications given acute bleeding.    DVT prophylaxis: SCDs while bleeding Code Status: Full Family Communication: Granddaughter, Harold Jones on the phone Disposition Plan: Pending EGD   Consultants:   GI  Procedures:   None to date  Antimicrobials:   None    Subjective: Spoke with patient using interpreter services.  Patient reported that he "hurts in body and soul" due to his wife and mother being passed on.  He  does not have a clear understanding of his current situation, but this is likely due to being hard of hearing.  He reported no pain or bleeding.  No further history was able to be obtained.  Any question the interpreter asked was answered with philosophical answers about his family and living situation.   Spoke with the patients daughter Harold Jones who reports that the patient is more confused, but calmer.  She states that the patients daughter (her mother) presented to the hospital yesterday and should have signed the consent forms to have the patient undergo EGD.   Objective: Filed Vitals:   01/13/16 1622 01/13/16 2032 01/14/16 0514 01/14/16 1009  BP: 137/69 150/70 157/53 147/74  Pulse: 59 66 66 107  Temp: 97.9 F (36.6 C) 97.5 F (36.4 C) 97.5 F (36.4 C) 98.2 F (36.8 C)  TempSrc: Oral Oral Oral Oral  Resp: Height:      Weight:      SpO2: 95% 94% 94% 98%    Intake/Output Summary (Last 24 hours) at 01/14/16 1122 Last data filed at 01/14/16 0900  Gross per 24 hour  Intake   1020 ml  Output    650 ml  Net    370 ml   Filed Weights   01/12/16 0851 01/12/16 2008  Weight: 190 lb (86.183 kg) 208 lb 1.8 oz (94.4 kg)    Examination:  General exam: Appears calm and comfortable, somewhat  agitated with questions, wearing soft wrist restraints. HENT: NCAT, neck is supple   Respiratory system: Clear to auscultation. Respiratory effort normal. Cardiovascular system: S1 & S2 heard, RR, NR. No pedal edema. Gastrointestinal system: Abdomen is nondistended, soft and nontender. Normal bowel sounds heard. Central nervous system: Alert and oriented. No focal neurological deficits. Skin: Seborrheic keratosis noted on face/head Psychiatry: insight is low at this time, mood is somewhat agitated    Data Reviewed:   CBC:  Recent Labs Lab 01/12/16 0841 01/13/16 0414  WBC 6.3 6.5  NEUTROABS 4.5  --   HGB 6.0* 7.3*  HCT 19.8* 23.2*  MCV 79.8 80.8  PLT 185 176   Basic  Metabolic Panel:  Recent Labs Lab 01/12/16 0841 01/13/16 0414  NA 138 137  K 4.8 5.3*  CL 107 108  CO2 24 21*  GLUCOSE 178* 113*  BUN 50* 40*  CREATININE 1.95* 1.59*  CALCIUM 9.1 8.4*   GFR: Estimated Creatinine Clearance: 38.5 mL/min (by C-G formula based on Cr of 1.59). Liver Function Tests:  Recent Labs Lab 01/12/16 0841  AST 13*  ALT 13*  ALKPHOS 32*  BILITOT 0.9  PROT 6.7  ALBUMIN 3.5    Recent Labs Lab 01/12/16 0841  LIPASE 19   No results for input(s): AMMONIA in the last 168 hours. Coagulation Profile: No results for input(s): INR, PROTIME in the last 168 hours. Cardiac Enzymes:  Recent Labs Lab 01/12/16 0841  TROPONINI 0.03*   BNP (last 3 results) No results for input(s): PROBNP in the last 8760 hours. HbA1C:  Recent Labs  01/12/16 0841  HGBA1C 8.3*   CBG:  Recent Labs Lab 01/13/16 1605 01/13/16 2025 01/14/16 0021 01/14/16 0413 01/14/16 0739  GLUCAP 156* 128* 128* 142* 134*     Recent Results (from the past 240 hour(s))  MRSA PCR Screening     Status: None   Collection Time: 01/12/16  4:23 PM  Result Value Ref Range Status   MRSA by PCR NEGATIVE NEGATIVE Final    Comment:        The GeneXpert MRSA Assay (FDA approved for NASAL specimens only), is one component of a comprehensive MRSA colonization surveillance program. It is not intended to diagnose MRSA infection nor to guide or monitor treatment for MRSA infections.          Radiology Studies: Ct Abdomen Pelvis Wo Contrast  01/12/2016  CLINICAL DATA:  Abdominal pain with nausea vomiting. EXAM: CT ABDOMEN AND PELVIS WITHOUT CONTRAST TECHNIQUE: Multidetector CT imaging of the abdomen and pelvis was performed following the standard protocol without IV contrast. COMPARISON:  None. FINDINGS: Of note, study is degraded by patient motion. Lower chest: 8 mm right middle lobe pulmonary nodule seen on image 6 series 205. 4 mm right lower lobe pulmonary nodule identified on the  same image. Hepatobiliary: No focal abnormality in the liver on this study without intravenous contrast. No evidence of hepatomegaly. Gallbladder is surgically absent. No intrahepatic or extrahepatic biliary dilation. Pancreas: No focal mass lesion. No dilatation of the main duct. No intraparenchymal cyst. No peripancreatic edema. Spleen: No splenomegaly. No focal mass lesion. Adrenals/Urinary Tract: No adrenal nodule or mass. Low-density lesions seen in the kidneys bilaterally approach water attenuation are likely cysts. Dominant lesion on the right is 2.5 cm and dominant lesion on the left is lower pole measuring 5.1 cm. No overt hydronephrosis in either kidney. No evidence for hydroureter. Small left-sided diverticulum noted in otherwise unremarkable appearing urinary bladder. Stomach/Bowel: Stomach is nondistended. No gastric  wall thickening. No evidence of outlet obstruction. Duodenum is normally positioned as is the ligament of Treitz. No small bowel wall thickening. No small bowel dilatation. The terminal ileum is normal. The appendix is normal. No gross colonic mass. No colonic wall thickening. No substantial diverticular change. Vascular/Lymphatic: There is abdominal aortic atherosclerosis without aneurysm. There is no gastrohepatic or hepatoduodenal ligament lymphadenopathy. No intraperitoneal or retroperitoneal lymphadenopathy. 10 mm short axis upper normal left para-aortic lymph node identified. No pelvic sidewall lymphadenopathy. Reproductive: The prostate gland and seminal vesicles have normal imaging features. Other: No intraperitoneal free fluid. Musculoskeletal: Degenerative changes are noted in the lower lumbar spine. IMPRESSION: 1. No acute abnormality identified. Specifically, no findings to explain the patient's history of abdominal pain with nausea vomiting. 2. 8 mm right middle lobe pulmonary nodule. Non-contrast chest CT at 6-12 months is recommended. If the nodule is stable at time of repeat  CT, then future CT at 18-24 months (from today's scan) is considered optional for low-risk patients, but is recommended for high-risk patients. This recommendation follows the consensus statement: Guidelines for Management of Incidental Pulmonary Nodules Detected on CT Images:From the Fleischner Society 2017; published online before print (10.1148/radiol.8119147829). Probable bilateral renal cysts. Electronically Signed   By: Kennith Center M.D.   On: 01/12/2016 12:07        Scheduled Meds: . sodium chloride   Intravenous Once  . atorvastatin  10 mg Oral Daily  . docusate sodium  100 mg Oral BID  . famotidine (PEPCID) IV  20 mg Intravenous Q12H  . gabapentin  300 mg Oral BID  . insulin aspart  0-15 Units Subcutaneous Q4H  . polyethylene glycol  17 g Oral Daily  . tamsulosin  0.4 mg Oral QPC supper   Continuous Infusions: . sodium chloride 125 mL/hr at 01/14/16 0745     LOS: 2 days    Time spent: 25 minutes    Debe Coder, MD [facility] Pager [phone]  If 7PM-7AM, please contact night-coverage [email or web address] Password [email or web address] 01/14/2016, 11:22 AM

## 2016-01-14 NOTE — Progress Notes (Signed)
Still agitated, in a posey and wrist restraints. Sitter reports he kicks.  No further GI evaluation until agitation resolves.

## 2016-01-15 DIAGNOSIS — E118 Type 2 diabetes mellitus with unspecified complications: Secondary | ICD-10-CM

## 2016-01-15 DIAGNOSIS — Z8673 Personal history of transient ischemic attack (TIA), and cerebral infarction without residual deficits: Secondary | ICD-10-CM

## 2016-01-15 LAB — BASIC METABOLIC PANEL
Anion gap: 5 (ref 5–15)
BUN: 19 mg/dL (ref 6–20)
CHLORIDE: 115 mmol/L — AB (ref 101–111)
CO2: 20 mmol/L — AB (ref 22–32)
Calcium: 8.5 mg/dL — ABNORMAL LOW (ref 8.9–10.3)
Creatinine, Ser: 1.34 mg/dL — ABNORMAL HIGH (ref 0.61–1.24)
GFR calc non Af Amer: 46 mL/min — ABNORMAL LOW (ref >60)
GFR, EST AFRICAN AMERICAN: 54 mL/min — AB (ref >60)
Glucose, Bld: 146 mg/dL — ABNORMAL HIGH (ref 65–99)
POTASSIUM: 3.7 mmol/L (ref 3.5–5.1)
SODIUM: 140 mmol/L (ref 135–145)

## 2016-01-15 LAB — CBC
HEMATOCRIT: 25.1 % — AB (ref 39.0–52.0)
HEMOGLOBIN: 7.8 g/dL — AB (ref 13.0–17.0)
MCH: 25.2 pg — ABNORMAL LOW (ref 26.0–34.0)
MCHC: 31.1 g/dL (ref 30.0–36.0)
MCV: 81.2 fL (ref 78.0–100.0)
Platelets: 197 10*3/uL (ref 150–400)
RBC: 3.09 MIL/uL — AB (ref 4.22–5.81)
RDW: 14.8 % (ref 11.5–15.5)
WBC: 7.9 10*3/uL (ref 4.0–10.5)

## 2016-01-15 LAB — IRON AND TIBC
IRON: 13 ug/dL — AB (ref 45–182)
SATURATION RATIOS: 3 % — AB (ref 17.9–39.5)
TIBC: 423 ug/dL (ref 250–450)
UIBC: 410 ug/dL

## 2016-01-15 LAB — GLUCOSE, CAPILLARY
GLUCOSE-CAPILLARY: 132 mg/dL — AB (ref 65–99)
GLUCOSE-CAPILLARY: 136 mg/dL — AB (ref 65–99)
GLUCOSE-CAPILLARY: 141 mg/dL — AB (ref 65–99)
GLUCOSE-CAPILLARY: 220 mg/dL — AB (ref 65–99)
Glucose-Capillary: 203 mg/dL — ABNORMAL HIGH (ref 65–99)
Glucose-Capillary: 169 mg/dL — ABNORMAL HIGH (ref 65–99)

## 2016-01-15 LAB — FERRITIN: FERRITIN: 17 ng/mL — AB (ref 24–336)

## 2016-01-15 MED ORDER — PANTOPRAZOLE SODIUM 40 MG PO TBEC
40.0000 mg | DELAYED_RELEASE_TABLET | Freq: Two times a day (BID) | ORAL | Status: DC
  Administered 2016-01-15 – 2016-01-19 (×6): 40 mg via ORAL
  Filled 2016-01-15 (×6): qty 1

## 2016-01-15 MED ORDER — RESOURCE THICKENUP CLEAR PO POWD
ORAL | Status: DC | PRN
  Filled 2016-01-15: qty 125

## 2016-01-15 NOTE — Evaluation (Signed)
Clinical/Bedside Swallow Evaluation Patient Details  Name: Harold Jones MRN: [id number] Date of Birth: 01/06/1928  Today's Date: 01/15/2016 Time: SLP Start Time (ACUTE ONLY): [id number] SLP Stop Time (ACUTE ONLY): 0953 SLP Time Calculation (min) (ACUTE ONLY): 29 min  Past Medical History:  Past Medical History  Diagnosis Date  . Hypertension   . Diabetes mellitus   . FIBRILLATION, ATRIAL 08/31/2002    Qualifier: Diagnosis of  By: Barbaraann Barthelankins MD, [place]    . CVA 02/28/2004    Annotation: embolic Qualifier: Diagnosis of  By: Barbaraann Barthelankins MD, [place]    . COGNITIVE DEFICITS DUE CEREBROVASCULAR DISEASE 09/09/2009    Qualifier: Diagnosis of  By: Delrae AlfredMulberry MD, Lanora ManisElizabeth    . CORONARY ARTERY DISEASE 02/27/2007    Qualifier: Diagnosis of  By: Barbaraann Barthelankins MD, [place]    . DYSLIPIDEMIA 02/27/2007    Qualifier: Diagnosis of  By: Barbaraann Barthelankins MD, [place]    . Symptomatic bradycardia 08/16/15    [employer] PPM, Dr. Ladona Ridgelaylor  . Aortic valve disorder   . Physical deconditioning   . Chest wall pain following surgery   . Osteoarthritis of left knee   . BPH without obstruction/lower urinary tract symptoms   . Grief   . Neuropathic pain    Past Surgical History:  Past Surgical History  Procedure Laterality Date  . Cardiac surgery    . Ep implantable device N/A 08/16/2015    [employer] PPM, Dr. Ladona Ridgelaylor   HPI:  Harold Jones is a 82 y.o. male with medical history significant for Afib, aortic valve replacement on Xarelto being admitted for acute blood loss anemia.    Assessment / Plan / Recommendation Clinical Impression  Pt with hx of moderate oropharygneal dysphagia (likely neurogenic in nature given hx of old infarcts) Pt with hx of aspiration of thin liquids per two MBSS' in 2016 with incidence of right lobe PNA at that time. Nursing reports pt incidence of aspiration this am during medicine administration. Pt noted to have variable mentation and intermittent agitation. Pt cooperative this date  during BSE. Note delayed cough following thin liquids. Pts edentulous oral cavity impedes functional mastication of solid PO. No overt signs or symptoms of aspiration with nectar thick liquids. Recommend dysphagia 2 (chopped) and nectar thick liquid diet with medicines whole in puree. ST to follow up for diet tolerance/possbile repeat MBS to gain more current objective swallow data.       Aspiration Risk  Moderate aspiration risk    Diet Recommendation   Dysphagia 2 (chopped) Nectar thick liquids  Medication Administration: Whole meds with puree    Other  Recommendations Oral Care Recommendations: Oral care BID Other Recommendations: Order thickener from pharmacy   Follow up Recommendations  24 hour supervision/assistance    Frequency and Duration min 2x/week  1 week       Prognosis Prognosis for Safe Diet Advancement: Fair Barriers to Reach Goals: Severity of deficits      Swallow Study   General Date of Onset: 01/12/16 HPI: Harold Jones is a 82 y.o. male with medical history significant for Afib, aortic valve replacement on Xarelto being admitted for acute blood loss anemia.  Type of Study: Bedside Swallow Evaluation Previous Swallow Assessment: MBS: nectar; D3; aspiration of thin  Diet Prior to this Study: NPO Temperature Spikes Noted: No Respiratory Status: Room air History of Recent Intubation: No Behavior/Cognition: Alert;Cooperative Oral Cavity Assessment: Dry Oral Care Completed by SLP: Yes Oral Cavity - Dentition: Edentulous Vision: Functional for self-feeding Self-Feeding Abilities: Able to feed self  Patient Positioning: Upright in bed Baseline Vocal Quality: Low vocal intensity Volitional Cough: Cognitively unable to elicit    Oral/Motor/Sensory Function Overall Oral Motor/Sensory Function: Within functional limits   Ice Chips     Thin Liquid Thin Liquid: Impaired Presentation: Cup Oral Phase Functional Implications: Prolonged oral transit Pharyngeal   Phase Impairments: Suspected delayed Swallow;Multiple swallows;Throat Clearing - Delayed    Nectar Thick Nectar Thick Liquid: Within functional limits   Honey Thick Honey Thick Liquid: Not tested   Puree Puree: Within functional limits   Solid   GO   Solid: Impaired Presentation: Self Fed Oral Phase Impairments: Impaired mastication;Reduced lingual movement/coordination Oral Phase Functional Implications: Prolonged oral transit;Impaired mastication Pharyngeal Phase Impairments: Suspected delayed Swallow;Multiple swallows       Marcene Duos MA, CCC-SLP Acute Care Speech Language Pathologist    Marcene Duos E 01/15/2016,9:55 AM

## 2016-01-15 NOTE — Progress Notes (Addendum)
PROGRESS NOTE    Harold Jones  ZOX:[id number] DOB: 07/20/1927 DOA: 01/12/2016    Brief Narrative:  Harold Jones is a 82 y.o. male with medical history significant for Afib, aortic valve replacement on Xarelto being admitted for acute blood loss anemia.   7/14 he was awaiting EGD and had an acute episode of agitation and EGD was post-poned.  GI declined to perform procedure until he is less agitated.     Assessment & Plan: GI Bleeding with acute blood loss anemia - DDx includes upper source such as an ulcer or gastritis and lower sources such as AVM, diverticular disease - Change to PO PPI BID today.  I am not sure that he will tolerate EGD given intermittent confusion and agitation.  Would possibly be able to be done if a family member could be present to translate - Hgb stabilized around 8 (7.8 today), iron is low, consider ferraheme  - GI consulted - Avoid blood thinners - NPO at midnight again, please call GI in the AM - NS at 125cc/hr  Agitation - Multifactorial including age, non English speaker and confusion, possible dementia? - Improved today, now off of restraints.  Was comfortable with sitter who was with him today.  - Lorazepam for agitation, needed once today  Dyslipidemia - Continue home atorvastatin    Essential hypertension - SBP trending 130s  -170s - Currently on home tamsulosin and home lisinopril at half dose, renal function is improving.  Consider increasing lisinopril to 20mg  tomorrow - baseline CKD  Atrial fibrillation - Heart rate ranging 50s-110s - He is not on a beta blocker or CCB per home meds - Monitor, goal would be HR < 110 - Xarelto held in the setting of bleeding  S/P aortic valve replacement with bioprosthetic valve - Xarelto held in the setting of bleeding, likely okay to continue to hold given it is a bioprosthetic valve  Diabetes mellitus, type 2  - BS ranging 140s-160s - Home glipizide on hold - He is eating more today.  If  no procedure, would start back home glipizide - Continue SSI, gabapentin    History of stroke - Good BP and sugar control while in house - Holding some home BP medications given acute bleeding.   Dysphagia - Previously noted that he had an MBS with aspiration - SLP today, on dysphagia diet   DVT prophylaxis: SCDs while bleeding Code Status: Full Family Communication: Granddaughter, Ammie FerrierLjerka on the phone.  She noted that her mother would be able to come up tomorrow if EGD planned, however, they would need some notice of the timing to get here in time.   Disposition Plan: Pending EGD   Consultants:   GI  Procedures:   None to date  Antimicrobials:   None    Subjective: Patient more calm today, seen early in the day, sitting on bed, conversing (though they do not speak the same language) with his sitter.  Seen later in the day and he was resting comfortably in bed.   Objective: Filed Vitals:   01/14/16 2249 01/15/16 0436 01/15/16 0916 01/15/16 1643  BP: 155/68 155/78 145/60 136/78  Pulse: 68 73 78 113  Temp:  97.9 F (36.6 C) 98 F (36.7 C) 98.6 F (37 C)  TempSrc:  Oral Oral Oral  Resp:  21  20  Height:      Weight:      SpO2:  96% 96% 92%    Intake/Output Summary (Last 24 hours) at 01/15/16 1700 Last data  filed at 01/15/16 1400  Gross per 24 hour  Intake   3500 ml  Output   1675 ml  Net   1825 ml   Filed Weights   01/12/16 0851 01/12/16 2008  Weight: 190 lb (86.183 kg) 208 lb 1.8 oz (94.4 kg)    Examination:  General exam: Appears calm and comfortable, no restraints HENT: NCAT, neck is supple   Respiratory system: Respiratory effort normal.  No audible wheezing Cardiovascular system: S1 & S2 heard, RR, NR. Gastrointestinal system: Abdomen is nondistended, soft and nontender. +BS Skin: Seborrheic keratosis noted on face/head Psychiatry: insight is low at this time, mood is calmer today    Data Reviewed:   CBC:  Recent Labs Lab 01/12/16 0841  01/13/16 0414 01/14/16 1045 01/15/16 0755  WBC 6.3 6.5 9.0 7.9  NEUTROABS 4.5  --  6.6  --   HGB 6.0* 7.3* 8.1* 7.8*  HCT 19.8* 23.2* 26.6* 25.1*  MCV 79.8 80.8 80.4 81.2  PLT 185 176 200 197   Basic Metabolic Panel:  Recent Labs Lab 01/12/16 0841 01/13/16 0414 01/14/16 1045 01/15/16 0755  NA 138 137 137 140  K 4.8 5.3* 3.9 3.7  CL 107 108 111 115*  CO2 24 21* 17* 20*  GLUCOSE 178* 113* 169* 146*  BUN 50* 40* 28* 19  CREATININE 1.95* 1.59* 1.47* 1.34*  CALCIUM 9.1 8.4* 8.7* 8.5*   GFR: Estimated Creatinine Clearance: 45.7 mL/min (by C-G formula based on Cr of 1.34). Liver Function Tests:  Recent Labs Lab 01/12/16 0841  AST 13*  ALT 13*  ALKPHOS 32*  BILITOT 0.9  PROT 6.7  ALBUMIN 3.5    Recent Labs Lab 01/12/16 0841  LIPASE 19   No results for input(s): AMMONIA in the last 168 hours. Coagulation Profile: No results for input(s): INR, PROTIME in the last 168 hours. Cardiac Enzymes:  Recent Labs Lab 01/12/16 0841  TROPONINI 0.03*   BNP (last 3 results) No results for input(s): PROBNP in the last 8760 hours. HbA1C: No results for input(s): HGBA1C in the last 72 hours. CBG:  Recent Labs Lab 01/14/16 2039 01/15/16 0002 01/15/16 0421 01/15/16 0741 01/15/16 1157  GLUCAP 122* 136* 141* 132* 203*     Recent Results (from the past 240 hour(s))  MRSA PCR Screening     Status: None   Collection Time: 01/12/16  4:23 PM  Result Value Ref Range Status   MRSA by PCR NEGATIVE NEGATIVE Final    Comment:        The GeneXpert MRSA Assay (FDA approved for NASAL specimens only), is one component of a comprehensive MRSA colonization surveillance program. It is not intended to diagnose MRSA infection nor to guide or monitor treatment for MRSA infections.          Radiology Studies: No results found.      Scheduled Meds: . sodium chloride   Intravenous Once  . atorvastatin  10 mg Oral Daily  . docusate sodium  100 mg Oral BID  .  gabapentin  300 mg Oral BID  . insulin aspart  0-15 Units Subcutaneous Q4H  . lisinopril  10 mg Oral Daily  . pantoprazole  40 mg Oral BID AC  . polyethylene glycol  17 g Oral Daily  . tamsulosin  0.4 mg Oral QPC supper   Continuous Infusions: . sodium chloride 125 mL/hr at 01/15/16 1637  . sodium chloride       LOS: 3 days    Time spent: 25 minutes  Debe Coder, MD [facility] Pager [phone]  If 7PM-7AM, please contact night-coverage [email or web address] Password [email or web address] 01/15/2016, 5:00 PM

## 2016-01-15 NOTE — Progress Notes (Signed)
Patient coughing with liquids & pills.  MD notified. Will continue to monitor.

## 2016-01-16 ENCOUNTER — Inpatient Hospital Stay (HOSPITAL_COMMUNITY): Payer: Medicare Other

## 2016-01-16 DIAGNOSIS — R410 Disorientation, unspecified: Secondary | ICD-10-CM | POA: Insufficient documentation

## 2016-01-16 DIAGNOSIS — Z954 Presence of other heart-valve replacement: Secondary | ICD-10-CM

## 2016-01-16 DIAGNOSIS — R4182 Altered mental status, unspecified: Secondary | ICD-10-CM | POA: Insufficient documentation

## 2016-01-16 DIAGNOSIS — R400 Somnolence: Secondary | ICD-10-CM

## 2016-01-16 DIAGNOSIS — I481 Persistent atrial fibrillation: Secondary | ICD-10-CM

## 2016-01-16 DIAGNOSIS — I482 Chronic atrial fibrillation: Secondary | ICD-10-CM

## 2016-01-16 LAB — TYPE AND SCREEN
ABO/RH(D): AB POS
Antibody Screen: NEGATIVE
Unit division: 0
Unit division: 0
Unit division: 0

## 2016-01-16 LAB — GLUCOSE, CAPILLARY
GLUCOSE-CAPILLARY: 160 mg/dL — AB (ref 65–99)
GLUCOSE-CAPILLARY: 153 mg/dL — AB (ref 65–99)
Glucose-Capillary: 141 mg/dL — ABNORMAL HIGH (ref 65–99)
Glucose-Capillary: 119 mg/dL — ABNORMAL HIGH (ref 65–99)

## 2016-01-16 LAB — CBC
HEMATOCRIT: 24.7 % — AB (ref 39.0–52.0)
HEMOGLOBIN: 7.5 g/dL — AB (ref 13.0–17.0)
MCH: 25.1 pg — ABNORMAL LOW (ref 26.0–34.0)
MCHC: 30.4 g/dL (ref 30.0–36.0)
MCV: 82.6 fL (ref 78.0–100.0)
Platelets: 190 10*3/uL (ref 150–400)
RBC: 2.99 MIL/uL — AB (ref 4.22–5.81)
RDW: 15.1 % (ref 11.5–15.5)
WBC: 6.2 10*3/uL (ref 4.0–10.5)

## 2016-01-16 LAB — TSH: TSH: 1.113 u[IU]/mL (ref 0.350–4.500)

## 2016-01-16 LAB — T4, FREE: Free T4: 1.02 ng/dL (ref 0.61–1.12)

## 2016-01-16 LAB — AMMONIA: Ammonia: 27 umol/L (ref 9–35)

## 2016-01-16 MED ORDER — LISINOPRIL 5 MG PO TABS: 5.0000 mg | ORAL_TABLET | Freq: Every day | ORAL | Status: DC

## 2016-01-16 MED ORDER — SODIUM CHLORIDE 0.9 % IV SOLN: INTRAVENOUS | Status: DC

## 2016-01-16 NOTE — Care Management Important Message (Signed)
Important Message  Patient Details  Name: Beryle BeamsRadomir Rands MRN: [id number] Date of Birth: 01/01/1928   Medicare Important Message Given:  Yes    Bernadette HoitShoffner, Lupita Rosales Coleman 01/16/2016, 3:08 PM

## 2016-01-16 NOTE — Consult Note (Signed)
CARDIOLOGY CONSULT NOTE       Patient ID: Harold Jones MRN: [id number] DOB/AGE: 01-14-1928 82 y.o.  Admit date: 01/12/2016 Referring Physician:  Susie Cassette Primary Physician: No primary care provider on file. Primary Cardiologist:  Ladona Ridgel Reason for Consultation:  Preop and Anticoagulation  Active Problems:   Dyslipidemia   Essential hypertension   FIBRILLATION, ATRIAL   H/O aortic valve replacement   Diabetes mellitus, type 2 (HCC)   History of stroke   S/P aortic valve replacement with bioprosthetic valve   GI bleed   GIB (gastrointestinal bleeding)   Acute GI bleeding   Altered mental state   Confusion   HPI: 82 y.o. admitted with GI bleed. Positive stool and Hb down to 6.  Bleed on low dose xarelto 15 mg for chronic afib. Previous tissue AVR and PPM for symptomatic bradycardia Limited history due to language barrier and agitations now lethargic after meds. Declining functional status last year. To have EGD but cancelled due to confusion and agitation No chest pain Previous CVA and vascular dementia Code status DNI.  Afib is chronic CRF;s HTN, Diabetes and elevated lipids.  Last echo 10/2013 peak gradient across tissue valve 53 mmHg trivial perivalvular leak . Reviewed   - Left ventricle: The cavity size was mildly dilated. Systolic function was vigorous. The estimated ejection fraction was in the range of 65% to 70%. Wall motion was normal; there were no regional wall motion abnormalities. - Aortic valve: Valve area: 1.09cm^2 (Vmax). - Mitral valve: Calcified annulus. Mildly thickened leaflets . - Left atrium: The atrium was moderately dilated. - Right ventricle: The cavity size was mildly dilated. Wall thickness was normal. Systolic function was normal. - Pulmonary arteries: PA peak pressure: 40mm Hg (S). - Pericardium, extracardiac: There was no pericardial effusion. - Impressions: Stable mild paravalvular leak. Mildly elevated transaortic gradients,  but stable when compared to the prior study from 03/2012. Impressions:  - Stable mild paravalvular leak. Mildly elevated transaortic gradients, but stable when compared to the prior study from 03/2012.  ROS All other systems reviewed and negative except as noted above  Past Medical History  Diagnosis Date  . Hypertension   . Diabetes mellitus   . FIBRILLATION, ATRIAL 08/31/2002    Qualifier: Diagnosis of  By: Barbaraann Barthel MD, [place]    . CVA 02/28/2004    Annotation: embolic Qualifier: Diagnosis of  By: Barbaraann Barthel MD, [place]    . COGNITIVE DEFICITS DUE CEREBROVASCULAR DISEASE 09/09/2009    Qualifier: Diagnosis of  By: Delrae Alfred MD, Lanora Manis    . CORONARY ARTERY DISEASE 02/27/2007    Qualifier: Diagnosis of  By: Barbaraann Barthel MD, [place]    . DYSLIPIDEMIA 02/27/2007    Qualifier: Diagnosis of  By: Barbaraann Barthel MD, [place]    . Symptomatic bradycardia 08/16/15    [employer] PPM, Dr. Ladona Ridgel  . Aortic valve disorder   . Physical deconditioning   . Chest wall pain following surgery   . Osteoarthritis of left knee   . BPH without obstruction/lower urinary tract symptoms   . Grief   . Neuropathic pain     History reviewed. No pertinent family history.  Social History   Social History  . Marital Status: Married    Spouse Name: N/A  . Number of Children: N/A  . Years of Education: N/A   Occupational History  . Not on file.   Social History Main Topics  . Smoking status: Never Smoker   . Smokeless tobacco: Never Used  . Alcohol Use: No  . Drug  Use: No  . Sexual Activity: Not on file   Other Topics Concern  . Not on file   Social History Narrative    Past Surgical History  Procedure Laterality Date  . Cardiac surgery    . Ep implantable device N/A 08/16/2015    [facility]     . sodium chloride   Intravenous Once  . atorvastatin  10 mg Oral Daily  . docusate sodium  100 mg Oral BID  . gabapentin  300 mg Oral BID  . insulin aspart  0-15 Units  Subcutaneous Q4H  . [START ON 01/17/2016] lisinopril  5 mg Oral Daily  . pantoprazole  40 mg Oral BID AC  . polyethylene glycol  17 g Oral Daily  . tamsulosin  0.4 mg Oral QPC supper   . sodium chloride 125 mL/hr at 01/16/16 1004  . sodium chloride      Physical Exam: Blood pressure 182/76, pulse 71, temperature 97.7 F (36.5 C), temperature source Oral, resp. rate 19, height 5\' 10"  (1.778 m), weight 207 lb 3.7 oz (94 kg), SpO2 97 %.   Lethargic  Chronically ill white male  HEENT: normal Neck supple with no adenopathy JVP normal no bruits no thyromegaly Lungs clear with no wheezing and good diaphragmatic motion Heart:  S1/S2 SEM  murmur, no rub, gallop or click PMI normal PPM under left clavicle  Abdomen: benighn, BS positve, no tenderness, no AAA no bruit.  No HSM or HJR Distal pulses intact with no bruits No edema Neuro non-focal Skin warm and dry No muscular weakness   Labs:   Lab Results  Component Value Date   WBC 6.2 01/16/2016   HGB 7.5* 01/16/2016   HCT 24.7* 01/16/2016   MCV 82.6 01/16/2016   PLT 190 01/16/2016    Recent Labs Lab 01/12/16 0841  01/15/16 0755  NA 138  < > 140  K 4.8  < > 3.7  CL 107  < > 115*  CO2 24  < > 20*  BUN 50*  < > 19  CREATININE 1.95*  < > 1.34*  CALCIUM 9.1  < > 8.5*  PROT 6.7  --   --   BILITOT 0.9  --   --   ALKPHOS 32*  --   --   ALT 13*  --   --   AST 13*  --   --   GLUCOSE 178*  < > 146*  < > = values in this interval not displayed. Lab Results  Component Value Date   CKTOTAL 55 10/08/2011   CKMB 2.2 10/08/2011   TROPONINI 0.03* 01/12/2016    Lab Results  Component Value Date   CHOL 118 10/09/2011   CHOL 124 05/30/2010   CHOL 135 07/22/2009   Lab Results  Component Value Date   HDL 27* 10/09/2011   HDL 23* 05/30/2010   HDL 35* 07/22/2009   Lab Results  Component Value Date   LDLCALC 66 10/09/2011   LDLCALC 70 05/30/2010   LDLCALC 74 07/22/2009   Lab Results  Component Value Date   TRIG 125  10/09/2011   TRIG 157* 05/30/2010   TRIG 132 07/22/2009   Lab Results  Component Value Date   CHOLHDL 4.4 10/09/2011   CHOLHDL 5.4 Ratio 05/30/2010   CHOLHDL 3.9 Ratio 07/22/2009   Lab Results  Component Value Date   LDLDIRECT 59.2 12/24/2007      Radiology: Ct Abdomen Pelvis Wo Contrast  01/12/2016  CLINICAL DATA:  Abdominal pain with nausea vomiting. EXAM: CT ABDOMEN AND PELVIS WITHOUT CONTRAST TECHNIQUE: Multidetector CT imaging of the abdomen and pelvis was performed following the standard protocol without IV contrast. COMPARISON:  None. FINDINGS: Of note, study is degraded by patient motion. Lower chest: 8 mm right middle lobe pulmonary nodule seen on image 6 series 205. 4 mm right lower lobe pulmonary nodule identified on the same image. Hepatobiliary: No focal abnormality in the liver on this study without intravenous contrast. No evidence of hepatomegaly. Gallbladder is surgically absent. No intrahepatic or extrahepatic biliary dilation. Pancreas: No focal mass lesion. No dilatation of the main duct. No intraparenchymal cyst. No peripancreatic edema. Spleen: No splenomegaly. No focal mass lesion. Adrenals/Urinary Tract: No adrenal nodule or mass. Low-density lesions seen in the kidneys bilaterally approach water attenuation are likely cysts. Dominant lesion on the right is 2.5 cm and dominant lesion on the left is lower pole measuring 5.1 cm. No overt hydronephrosis in either kidney. No evidence for hydroureter. Small left-sided diverticulum noted in otherwise unremarkable appearing urinary bladder. Stomach/Bowel: Stomach is nondistended. No gastric wall thickening. No evidence of outlet obstruction. Duodenum is normally positioned as is the ligament of Treitz. No small bowel wall thickening. No small bowel dilatation. The terminal ileum is normal. The appendix is normal. No gross colonic mass. No colonic wall thickening. No substantial diverticular change. Vascular/Lymphatic: There is  abdominal aortic atherosclerosis without aneurysm. There is no gastrohepatic or hepatoduodenal ligament lymphadenopathy. No intraperitoneal or retroperitoneal lymphadenopathy. 10 mm short axis upper normal left para-aortic lymph node identified. No pelvic sidewall lymphadenopathy. Reproductive: The prostate gland and seminal vesicles have normal imaging features. Other: No intraperitoneal free fluid. Musculoskeletal: Degenerative changes are noted in the lower lumbar spine. IMPRESSION: 1. No acute abnormality identified. Specifically, no findings to explain the patient's history of abdominal pain with nausea vomiting. 2. 8 mm right middle lobe pulmonary nodule. Non-contrast chest CT at 6-12 months is recommended. If the nodule is stable at time of repeat CT, then future CT at 18-24 months (from today's scan) is considered optional for low-risk patients, but is recommended for high-risk patients. This recommendation follows the consensus statement: Guidelines for Management of Incidental Pulmonary Nodules Detected on CT Images:From the Fleischner Society 2017; published online before print (10.1148/radiol.1610960454). Probable bilateral renal cysts. Electronically Signed   By: Kennith Center M.D.   On: 01/12/2016 12:07   Ct Head Wo Contrast  01/16/2016  CLINICAL DATA:  82 year old male presenting with confusion. Acute blood loss resulting in anemia. EXAM: CT HEAD WITHOUT CONTRAST TECHNIQUE: Contiguous axial images were obtained from the base of the skull through the vertex without intravenous contrast. COMPARISON:  Head CT 08/16/2015. FINDINGS: Mild to moderate cerebral atrophy. Patchy and confluent areas of decreased attenuation are noted throughout the deep and periventricular white matter of the cerebral hemispheres bilaterally, compatible with chronic microvascular ischemic disease. Old lacunar infarct in the left cerebellar hemisphere is unchanged. No acute intracranial abnormalities. Specifically, no evidence  of acute intracranial hemorrhage, no definite findings of acute/subacute cerebral ischemia, no mass, mass effect, hydrocephalus or abnormal intra or extra-axial fluid collections. Visualized paranasal sinuses and mastoids are well pneumatized. No acute displaced skull fractures are identified. IMPRESSION: 1. No acute intracranial abnormalities. 2. Extensive chronic microvascular ischemic changes throughout the cerebral white matter, and old left cerebellar infarct redemonstrated, as above. 3. Mild to moderate cerebral atrophy. Electronically Signed   By: Trudie Reed M.D.   On: 01/16/2016 09:59   Dg Chest [facility] [place]  01/16/2016  CLINICAL DATA:  Altered mental status. EXAM: PORTABLE CHEST 1 VIEW COMPARISON:  Radiographs of August 17, 2015. FINDINGS: Stable cardiomegaly. Sternotomy wires are noted. Left-sided pacemaker is unchanged in position. Increased central pulmonary vascular congestion and bilateral interstitial densities are noted concerning for pulmonary edema. No pneumothorax is noted. No significant pleural effusion is noted. Bony thorax is unremarkable. IMPRESSION: Increased central pulmonary vascular congestion and bilateral pulmonary edema is noted. Electronically Signed   By: Lupita Raider, M.D.   On: 01/16/2016 12:20     ECG:  afib LBBB   ASSESSMENT AND PLAN:  Preop:  Clear to have any type of colonoscopy or EGD to further elucidate etiology of GI bleed AVR:  Repeat echo ordered. Gradients a bit high but stable by echo 2015 no need for SBE Afib:  Would hold anticoagulation indefinitely given age and significant bleed already with low dose NOAC PPM:  Normal device function f/u Dr Ladona Ridgel   Signed: Charlton Haws 01/16/2016, 12:34 PM

## 2016-01-16 NOTE — Progress Notes (Signed)
Chalmer Hallenbeck 11:56 AM  Subjective: Patient seen and examined and hospital computer chart reviewed and case dicussed with my partner Dr. Evette CristalGanem and no signs of obvious bleeding  Objective: Vital signs table no acute distress still not obviously not oriented despite language issues abdomen is soft nontender hemoglobin slight drop patient trying to get out of bed  Assessment: Multiple medical problems currently his neurologic issues seem to be the most important Plan: Please call us back if Gi  workup is need and wanted particularly after mental status returns to baseline  [facility] HospitalMAGOD,Mickelle Goupil E  Pager [phone] After 5PM or if no answer call [phone]

## 2016-01-16 NOTE — Progress Notes (Signed)
Spoke w/ Dr. Susie CassetteAbrol.     We agree that, now that pt's mental status is better, we should proceed w/ egd b/c it would help Cardiology decide about future anticoagulation, especially since there has been a significant drop in pt's hgb from baseline.  Spoke via telephone w/ pt's granddaughter, Harold Jones, who identifies herself as the appropriate one to provide permission for procedure (altho her mother, pt's dtr, also gave consent last Friday), regarding nature, purpose, and risks of procedure, and she is agreeable to proceed.  The egd is scheduled for noon tomorrow.  Have ordered dinner for this evening.  Florencia Reasonsobert V. [name], M.D. Pager [phone] If no answer or after 5 PM call [phone]

## 2016-01-16 NOTE — Progress Notes (Signed)
SLP Cancellation Note  Patient Details Name: Beryle BeamsRadomir Stefan MRN: [id number] DOB: 08/04/1927   Cancelled treatment:       Reason Eval/Treat Not Completed: Other (comment). Pt NPO for EGD. Will reattempt next date.   [place]ro Kungle[place]ak, Amy K, MA, CCC-SLP 01/16/2016, 10:35 AM [phone]

## 2016-01-16 NOTE — Progress Notes (Addendum)
PROGRESS NOTE    Harold Jones  ZOX:[id number] DOB: Feb 04, 1928 DOA: 01/12/2016    Brief Narrative:  Harold Jones is a 82 y.o. male with medical history significant for Afib, aortic valve replacement on Xarelto being admitted for acute blood loss anemia.  7/14 he was awaiting EGD and had an acute episode of agitation and EGD was post-poned.  GI declined to perform procedure until he is less agitated.     Assessment & Plan: GI Bleeding with acute blood loss anemia - DDx includes upper source such as an ulcer or gastritis and lower sources such as AVM, diverticular disease -Continue PPI.  I am not sure that he will tolerate EGD given intermittent confusion and agitation.  Patient may need to be sedated for EGD/colonoscopy- Hgb stabilized around 8 (7.5 today), iron is low, CONT  ferraheme  - GI consulted, but have not seen him since 7/15, reconsulted Dr Matthias Hughs  May have EGD under propofol today Cards consult for preop clearance  - NPO ,  - NS at 125cc/hr   Agitation, improved / hx of vascular dementia  - Multifactorial including age, non English speaker and confusion, possible dementia? - Improved today, now off of restraints.  Was comfortable with sitter who was with him today.  CT negative , likely at baseline cognitively ,  DNI , but would like CPR per grandaughter   Dyslipidemia - Continue home atorvastatin    Essential hypertension - SBP trending 130s  -170s - Currently on home tamsulosin and home lisinopril at half dose, renal function is improving.  Consider increasing lisinopril to  tomorrow - baseline CKD  Atrial fibrillation - Heart rate ranging 50s-110s - He is not on a beta blocker or CCB per home meds - Monitor, goal would be HR < 110 - Xarelto held in the setting of bleeding  S/P aortic valve replacement with bioprosthetic valve - Xarelto held in the setting of bleeding, likely okay to continue to hold given it is a bioprosthetic valve Cards to  decide on continuing longterm anticoagulation   and do preop clearance along with GI workup   Diabetes mellitus, type 2  - BS ranging 140s-160s - Home glipizide on hold -    If no procedure, would start back home glipizide - Continue SSI, gabapentin    History of stroke - Good BP and sugar control while in house - Holding some home BP medications given acute bleeding.   Dysphagia - Previously noted that he had an MBS with aspiration - SLP today, on dysphagia diet   DVT prophylaxis: SCDs while bleeding Code Status: Full  Family Communication: discussed with Granddaughter, Ammie Ferrier on the phone.  She noted that her mother would be able to come up tomorrow if EGD planned, ordered cards consult for preop clearance in case the patient needs to be sedated, Call grandaughter and update her about all plan of care   Disposition Plan: Pending EGD   Consultants:   GI next) cardiology  Procedures:   None to date  Antimicrobials:   None    Subjective: Patient more calm today, seen early in the day, sitting on bed, sleeping comfortably Out of restraints  Objective: Filed Vitals:   01/15/16 1643 01/15/16 2024 01/16/16 0508 01/16/16 0734  BP: 136/78 150/66 168/63 182/76  Pulse: 113 68 65 71  Temp: 98.6 F (37 C) 97.8 F (36.6 C) 98.1 F (36.7 C) 97.7 F (36.5 C)  TempSrc: Oral   Oral  Resp: Height:  Weight:  94 kg (207 lb 3.7 oz)    SpO2: 92% 99% 95% 97%    Intake/Output Summary (Last 24 hours) at 01/16/16 1059 Last data filed at 01/16/16 0914  Gross per 24 hour  Intake   1300 ml  Output   1525 ml  Net   -225 ml   Filed Weights   01/12/16 0851 01/12/16 2008 01/15/16 2024  Weight: 86.183 kg (190 lb) 94.4 kg (208 lb 1.8 oz) 94 kg (207 lb 3.7 oz)    Examination:  General exam: Appears calm and comfortable, no restraints HENT: NCAT, neck is supple   Respiratory system: Respiratory effort normal.  No audible wheezing Cardiovascular system: S1 & S2  heard, RR, NR. Gastrointestinal system: Abdomen is nondistended, soft and nontender. +BS Skin: Seborrheic keratosis noted on face/head Psychiatry: insight is low at this time, mood is calmer today    Data Reviewed:   CBC:  Recent Labs Lab 01/12/16 0841 01/13/16 0414 01/14/16 1045 01/15/16 0755 01/16/16 0450  WBC 6.3 6.5 9.0 7.9 6.2  NEUTROABS 4.5  --  6.6  --   --   HGB 6.0* 7.3* 8.1* 7.8* 7.5*  HCT 19.8* 23.2* 26.6* 25.1* 24.7*  MCV 79.8 80.8 80.4 81.2 82.6  PLT 185 176 200 197 190   Basic Metabolic Panel:  Recent Labs Lab 01/12/16 0841 01/13/16 0414 01/14/16 1045 01/15/16 0755  NA 138 137 137 140  K 4.8 5.3* 3.9 3.7  CL 107 108 111 115*  CO2 24 21* 17* 20*  GLUCOSE 178* 113* 169* 146*  BUN 50* 40* 28* 19  CREATININE 1.95* 1.59* 1.47* 1.34*  CALCIUM 9.1 8.4* 8.7* 8.5*   GFR: Estimated Creatinine Clearance: 45.6 mL/min (by C-G formula based on Cr of 1.34). Liver Function Tests:  Recent Labs Lab 01/12/16 0841  AST 13*  ALT 13*  ALKPHOS 32*  BILITOT 0.9  PROT 6.7  ALBUMIN 3.5    Recent Labs Lab 01/12/16 0841  LIPASE 19   No results for input(s): AMMONIA in the last 168 hours. Coagulation Profile: No results for input(s): INR, PROTIME in the last 168 hours. Cardiac Enzymes:  Recent Labs Lab 01/12/16 0841  TROPONINI 0.03*   BNP (last 3 results) No results for input(s): PROBNP in the last 8760 hours. HbA1C: No results for input(s): HGBA1C in the last 72 hours. CBG:  Recent Labs Lab 01/15/16 0741 01/15/16 1157 01/15/16 1628 01/15/16 2117 01/16/16 0732  GLUCAP 132* 203* 169* 220* 160*     Recent Results (from the past 240 hour(s))  MRSA PCR Screening     Status: None   Collection Time: 01/12/16  4:23 PM  Result Value Ref Range Status   MRSA by PCR NEGATIVE NEGATIVE Final    Comment:        The GeneXpert MRSA Assay (FDA approved for NASAL specimens only), is one component of a comprehensive MRSA colonization surveillance  program. It is not intended to diagnose MRSA infection nor to guide or monitor treatment for MRSA infections.          Radiology Studies: Ct Head Wo Contrast  01/16/2016  CLINICAL DATA:  82 year old male presenting with confusion. Acute blood loss resulting in anemia. EXAM: CT HEAD WITHOUT CONTRAST TECHNIQUE: Contiguous axial images were obtained from the base of the skull through the vertex without intravenous contrast. COMPARISON:  Head CT 08/16/2015. FINDINGS: Mild to moderate cerebral atrophy. Patchy and confluent areas of decreased attenuation are noted throughout the deep and periventricular white matter of the  cerebral hemispheres bilaterally, compatible with chronic microvascular ischemic disease. Old lacunar infarct in the left cerebellar hemisphere is unchanged. No acute intracranial abnormalities. Specifically, no evidence of acute intracranial hemorrhage, no definite findings of acute/subacute cerebral ischemia, no mass, mass effect, hydrocephalus or abnormal intra or extra-axial fluid collections. Visualized paranasal sinuses and mastoids are well pneumatized. No acute displaced skull fractures are identified. IMPRESSION: 1. No acute intracranial abnormalities. 2. Extensive chronic microvascular ischemic changes throughout the cerebral white matter, and old left cerebellar infarct redemonstrated, as above. 3. Mild to moderate cerebral atrophy. Electronically Signed   By: Trudie Reedaniel  Entrikin M.D.   On: 01/16/2016 09:59        Scheduled Meds: . sodium chloride   Intravenous Once  . atorvastatin  10 mg Oral Daily  . docusate sodium  100 mg Oral BID  . gabapentin  300 mg Oral BID  . insulin aspart  0-15 Units Subcutaneous Q4H  . lisinopril  10 mg Oral Daily  . pantoprazole  40 mg Oral BID AC  . polyethylene glycol  17 g Oral Daily  . tamsulosin  0.4 mg Oral QPC supper   Continuous Infusions: . sodium chloride 125 mL/hr at 01/16/16 1004  . sodium chloride       LOS: 4 days      Time spent: 25 minutes    Richarda OverlieABROL,Zamauri Nez, MD [facility] Pager [phone]  If 7PM-7AM, please contact night-coverage [email or web address] Password [email or web address] 01/16/2016, 10:59 AM

## 2016-01-17 ENCOUNTER — Encounter (HOSPITAL_COMMUNITY): Payer: Self-pay

## 2016-01-17 ENCOUNTER — Inpatient Hospital Stay (HOSPITAL_COMMUNITY): Payer: Medicare Other | Admitting: Anesthesiology

## 2016-01-17 ENCOUNTER — Encounter (HOSPITAL_COMMUNITY)
Admission: EM | Disposition: A | Discharge status: Skilled Nursing Facility | Payer: Self-pay | Source: Home / Self Care | Attending: Internal Medicine

## 2016-01-17 ENCOUNTER — Inpatient Hospital Stay (HOSPITAL_COMMUNITY): Payer: Medicare Other

## 2016-01-17 DIAGNOSIS — K922 Gastrointestinal hemorrhage, unspecified: Principal | ICD-10-CM

## 2016-01-17 DIAGNOSIS — Z01810 Encounter for preprocedural cardiovascular examination: Secondary | ICD-10-CM

## 2016-01-17 DIAGNOSIS — E785 Hyperlipidemia, unspecified: Secondary | ICD-10-CM

## 2016-01-17 DIAGNOSIS — I10 Essential (primary) hypertension: Secondary | ICD-10-CM

## 2016-01-17 HISTORY — PX: ESOPHAGOGASTRODUODENOSCOPY (EGD) WITH PROPOFOL: SHX5813

## 2016-01-17 LAB — COMPREHENSIVE METABOLIC PANEL
ALBUMIN: 3 g/dL — AB (ref 3.5–5.0)
ALK PHOS: 34 U/L — AB (ref 38–126)
ALT: 13 U/L — ABNORMAL LOW (ref 17–63)
ANION GAP: 5 (ref 5–15)
AST: 15 U/L (ref 15–41)
BILIRUBIN TOTAL: 1.1 mg/dL (ref 0.3–1.2)
BUN: 17 mg/dL (ref 6–20)
CALCIUM: 8.4 mg/dL — AB (ref 8.9–10.3)
CO2: 20 mmol/L — ABNORMAL LOW (ref 22–32)
Chloride: 119 mmol/L — ABNORMAL HIGH (ref 101–111)
Creatinine, Ser: 1.28 mg/dL — ABNORMAL HIGH (ref 0.61–1.24)
GFR, EST AFRICAN AMERICAN: 57 mL/min — AB (ref >60)
GFR, EST NON AFRICAN AMERICAN: 49 mL/min — AB (ref >60)
GLUCOSE: 152 mg/dL — AB (ref 65–99)
POTASSIUM: 3.6 mmol/L (ref 3.5–5.1)
Sodium: 144 mmol/L (ref 135–145)
TOTAL PROTEIN: 6.2 g/dL — AB (ref 6.5–8.1)

## 2016-01-17 LAB — GLUCOSE, CAPILLARY
GLUCOSE-CAPILLARY: 144 mg/dL — AB (ref 65–99)
GLUCOSE-CAPILLARY: 147 mg/dL — AB (ref 65–99)
GLUCOSE-CAPILLARY: 159 mg/dL — AB (ref 65–99)
GLUCOSE-CAPILLARY: 167 mg/dL — AB (ref 65–99)
Glucose-Capillary: 150 mg/dL — ABNORMAL HIGH (ref 65–99)
Glucose-Capillary: 196 mg/dL — ABNORMAL HIGH (ref 65–99)

## 2016-01-17 LAB — CBC
HEMATOCRIT: 24.7 % — AB (ref 39.0–52.0)
HEMOGLOBIN: 7.4 g/dL — AB (ref 13.0–17.0)
MCH: 24.9 pg — AB (ref 26.0–34.0)
MCHC: 30 g/dL (ref 30.0–36.0)
MCV: 83.2 fL (ref 78.0–100.0)
Platelets: 212 10*3/uL (ref 150–400)
RBC: 2.97 MIL/uL — ABNORMAL LOW (ref 4.22–5.81)
RDW: 15.2 % (ref 11.5–15.5)
WBC: 6.6 10*3/uL (ref 4.0–10.5)

## 2016-01-17 LAB — ECHOCARDIOGRAM COMPLETE
Height: 70 in
Weight: 3322.77 oz

## 2016-01-17 SURGERY — ESOPHAGOGASTRODUODENOSCOPY (EGD) WITH PROPOFOL
Anesthesia: Monitor Anesthesia Care

## 2016-01-17 MED ORDER — INSULIN ASPART 100 UNIT/ML ~~LOC~~ SOLN
0.0000 [IU] | Freq: Three times a day (TID) | SUBCUTANEOUS | Status: DC
  Administered 2016-01-17: 3 [IU] via SUBCUTANEOUS
  Administered 2016-01-18: 5 [IU] via SUBCUTANEOUS
  Administered 2016-01-18 (×2): 3 [IU] via SUBCUTANEOUS
  Administered 2016-01-18: 8 [IU] via SUBCUTANEOUS
  Administered 2016-01-19: 5 [IU] via SUBCUTANEOUS
  Administered 2016-01-19: 3 [IU] via SUBCUTANEOUS

## 2016-01-17 MED ORDER — LIDOCAINE HCL (CARDIAC) 20 MG/ML IV SOLN
INTRAVENOUS | Status: DC | PRN
  Administered 2016-01-17: 40 mg via INTRATRACHEAL

## 2016-01-17 MED ORDER — PROPOFOL 10 MG/ML IV BOLUS
INTRAVENOUS | Status: DC | PRN
  Administered 2016-01-17 (×2): 50 mg via INTRAVENOUS
  Administered 2016-01-17: 40 mg via INTRAVENOUS

## 2016-01-17 MED ORDER — LISINOPRIL 10 MG PO TABS: 10.0000 mg | ORAL_TABLET | Freq: Every day | ORAL | Status: DC

## 2016-01-17 MED ORDER — LACTATED RINGERS IV SOLN
INTRAVENOUS | Status: DC | PRN
  Administered 2016-01-17: 11:00:00 via INTRAVENOUS

## 2016-01-17 MED ORDER — LISINOPRIL 20 MG PO TABS
20.0000 mg | ORAL_TABLET | Freq: Every day | ORAL | Status: DC
  Administered 2016-01-18 – 2016-01-19 (×2): 20 mg via ORAL
  Filled 2016-01-17 (×3): qty 1

## 2016-01-17 MED ORDER — FUROSEMIDE 10 MG/ML IJ SOLN
40.0000 mg | Freq: Once | INTRAMUSCULAR | Status: DC
  Filled 2016-01-17: qty 4

## 2016-01-17 NOTE — Op Note (Signed)
[facility] Patient Name: Harold Jones Procedure Date : 01/17/2016 MRN: [id number] Attending MD: Bernette Redbird , MD Date of Birth: 01-31-1928 CSN: [id number] Age: 82 Admit Type: Inpatient Procedure:                Upper GI endoscopy Indications:              Recent gastrointestinal bleeding with significant                            drop in hemoglobin while on Xarelto Providers:                Bernette Redbird, MD, Will Bonnet RN, RN,                            Kandice Robinsons, Technician Referring MD:              Medicines:                Monitored Anesthesia Care Complications:            No immediate complications. Estimated Blood Loss:     Estimated blood loss: none. Procedure:                Pre-Anesthesia Assessment:                           - Prior to the procedure, a History and Physical                            was performed, and patient medications and                            allergies were reviewed. The risks and benefits of                            the procedure and the sedation options and risks                            were discussed with the patient's granddaughter.                            All questions were answered, and informed consent                            was obtained. Prior Anticoagulants: The patient has                            taken Xarelto (rivaroxaban), last dose was 6 days                            prior to procedure. ASA Grade Assessment: III - A                            patient with severe systemic disease. After  reviewing the risks and benefits, the patient was                            deemed in satisfactory condition to undergo the                            procedure nd he was propped from his hospital room                            [facility]                           After obtaining informed consent, the endoscope was                            passed  under direct vision. Throughout the                            procedure, the patient's blood pressure, pulse, and                            oxygen saturations were monitored continuously. The                            EG-[id number] ([id number]) scope was introduced through the                            mouth, and advanced to the second part of duodenum.                            The upper GI endoscopy was accomplished without                            difficulty. The patient tolerated the procedure                            well. Findings:      The larynx was normal.      A low-grade of narrowing, mild Schatzki ring (acquired) was found in the       distal esophagus above a small, intermittent hiatal hernia.      The exam of the esophagus was otherwise normal.      Patchy hemorrhagic mucosa (focal mucosal hemorrhages) with no active       bleeding and no stigmata of recent bleeding was found in the gastric       antrum.      No other significant abnormalities were identified in a careful       examination of the stomach. Specifically, no ulcers, erosions, masses,       or vascular ectasia were observed on very careful inspection, especially       in the prepyloric area.      The cardia and gastric fundus were normal on retroflexion.      The examined duodenum was normal with particularly careful inspection of       the bulb and junction of the bulb  and second duodenum. Impression:               - No active bleeding or blood in the stomach at the                            time of this procedure.                           - No prospective source for patient's recent                            significant GI blood loss identified.                           - Normal larynx.                           - Low-grade of narrowing and mild Schatzki ring.                           - Minimal hemorrhagic gastropathy not clinically                            significant.                           -  Normal examined duodenum.                           - No specimens collected. Moderate Sedation:      This patient was sedated with monitored anesthesia care, not moderate       sedation. Recommendation:           - Continue present medications.                           - No endoscopic contraindication to resumption of                            anticoagulation observed. I would defer the                            decision about whether, and what type, of                            anticoagulation to use in this patient to the                            patient's cardiologist.                           - In view of the patient's age and overall medical                            condition, and the fact that his BUN was elevated  on admission suggestive of an upper tract source, I                            would not currently suggest colonoscopic evaluation                            although that could be considered if he has future                            GI bleeding episodes suggestive of a lower tract                            origin.                           - Resume regular diet [Duration]. Procedure Code(s):        --- Professional ---                           [phone], Esophagogastroduodenoscopy, flexible,                            transoral; diagnostic, including collection of                            specimen(s) by brushing or washing, when performed                            (separate procedure) Diagnosis Code(s):        --- Professional ---                           K92.2, Gastrointestinal hemorrhage, unspecified CPT copyright 2016 [employer]. All rights reserved. The codes documented in this report are preliminary and upon coder review may  be revised to meet current compliance requirements. Bernette Redbird, MD 01/17/2016 12:35:34 PM This report has been signed electronically. Number of Addenda: 0

## 2016-01-17 NOTE — Progress Notes (Signed)
Patient ID: Harold Jones, male   DOB: 04/17/1928, 82 y.o.   MRN: [id number]    Subjective:  Does not speak english less agitated For EGD today   Objective:  Filed Vitals:   01/16/16 1626 01/16/16 2046 01/17/16 0542 01/17/16 0728  BP: 157/54 133/89 177/55 198/63  Pulse: 57 88 57 57  Temp: 98.4 F (36.9 C) 98.5 F (36.9 C) 98.6 F (37 C) 99.2 F (37.3 C)  TempSrc: Axillary Oral Oral Oral  Resp: Height:      Weight:  207 lb 10.8 oz (94.2 kg)    SpO2: 100% 98% 96% 95%    Intake/Output from previous day:  Intake/Output Summary (Last 24 hours) at 01/17/16 0957 Last data filed at 01/17/16 0910  Gross per 24 hour  Intake    120 ml  Output    575 ml  Net   -455 ml    Physical Exam: Affect appropriate Chronically ill white male  HEENT: normal Neck supple with no adenopathy JVP normal no bruits no thyromegaly Lungs clear with no wheezing and good diaphragmatic motion Heart:  S1/S2 AS/AR  murmur, no rub, gallop or click PMI normal  PPM under left clavicle  Abdomen: benighn, BS positve, no tenderness, no AAA no bruit.  No HSM or HJR Distal pulses intact with no bruits No edema Neuro non-focal Skin warm and dry No muscular weakness   Lab Results: Basic Metabolic Panel:  Recent Labs  13/24/40 0755 01/17/16 0608  NA 140 144  K 3.7 3.6  CL 115* 119*  CO2 20* 20*  GLUCOSE 146* 152*  BUN 19 17  CREATININE 1.34* 1.28*  CALCIUM 8.5* 8.4*   Liver Function Tests:  Recent Labs  01/17/16 0608  AST 15  ALT 13*  ALKPHOS 34*  BILITOT 1.1  PROT 6.2*  ALBUMIN 3.0*   CBC:  Recent Labs  01/14/16 1045  01/16/16 0450 01/17/16 0608  WBC 9.0  < > 6.2 6.6  NEUTROABS 6.6  --   --   --   HGB 8.1*  < > 7.5* 7.4*  HCT 26.6*  < > 24.7* 24.7*  MCV 80.4  < > 82.6 83.2  PLT 200  < > 190 212  < > = values in this interval not displayed. Thyroid Function Tests:  Recent Labs  01/16/16 0812  TSH 1.113   Anemia Panel:  Recent Labs  01/15/16 1134    FERRITIN 17*  TIBC 423  IRON 13*    Imaging: Ct Head Wo Contrast  01/16/2016  CLINICAL DATA:  82 year old male presenting with confusion. Acute blood loss resulting in anemia. EXAM: CT HEAD WITHOUT CONTRAST TECHNIQUE: Contiguous axial images were obtained from the base of the skull through the vertex without intravenous contrast. COMPARISON:  Head CT 08/16/2015. FINDINGS: Mild to moderate cerebral atrophy. Patchy and confluent areas of decreased attenuation are noted throughout the deep and periventricular white matter of the cerebral hemispheres bilaterally, compatible with chronic microvascular ischemic disease. Old lacunar infarct in the left cerebellar hemisphere is unchanged. No acute intracranial abnormalities. Specifically, no evidence of acute intracranial hemorrhage, no definite findings of acute/subacute cerebral ischemia, no mass, mass effect, hydrocephalus or abnormal intra or extra-axial fluid collections. Visualized paranasal sinuses and mastoids are well pneumatized. No acute displaced skull fractures are identified. IMPRESSION: 1. No acute intracranial abnormalities. 2. Extensive chronic microvascular ischemic changes throughout the cerebral white matter, and old left cerebellar infarct redemonstrated, as above. 3. Mild to moderate cerebral atrophy. Electronically  Signed   By: Trudie Reedaniel  Entrikin M.D.   On: 01/16/2016 09:59   Dg Chest Port 1 View  01/16/2016  CLINICAL DATA:  Altered mental status. EXAM: PORTABLE CHEST 1 VIEW COMPARISON:  Radiographs of August 17, 2015. FINDINGS: Stable cardiomegaly. Sternotomy wires are noted. Left-sided pacemaker is unchanged in position. Increased central pulmonary vascular congestion and bilateral interstitial densities are noted concerning for pulmonary edema. No pneumothorax is noted. No significant pleural effusion is noted. Bony thorax is unremarkable. IMPRESSION: Increased central pulmonary vascular congestion and bilateral pulmonary edema is  noted. Electronically Signed   By: Lupita RaiderJames  Green Jr, M.D.   On: 01/16/2016 12:20    Cardiac Studies:  ECG: Afib    Telemetry: afib rates ok   Echo: AVR mean gradient 26 mmHg peak 55 mmHg EF 60-65% moderate AR   Medications:   . sodium chloride   Intravenous Once  . atorvastatin  10 mg Oral Daily  . docusate sodium  100 mg Oral BID  . gabapentin  300 mg Oral BID  . insulin aspart  0-15 Units Subcutaneous Q4H  . lisinopril  10 mg Oral Daily  . pantoprazole  40 mg Oral BID AC  . polyethylene glycol  17 g Oral Daily  . tamsulosin  0.4 mg Oral QPC supper     . sodium chloride    . sodium chloride      Assessment/Plan:  Afib:  GI bleed on low dose NOAC and age have d/c anticoagulation GI:  For EGD today  AVR:  Gradients have been high since 2015 deteriorating tissue AVR but stable PPM:  Normal function f/u Dr Ladona Ridgelaylor HTN:  Increase lisinopril to 20 mg daily given AR  Harold HawsPeter Lizabeth Jones 01/17/2016, 9:57 AM

## 2016-01-17 NOTE — Anesthesia Preprocedure Evaluation (Addendum)
Anesthesia Evaluation  Patient identified by MRN, date of birth, ID band Patient awake    Reviewed: Allergy & Precautions, H&P , NPO status , Patient's Chart, lab work & pertinent test results  Airway Mallampati: II       Dental  (+) Edentulous Upper, Edentulous Lower   Pulmonary pneumonia,    Pulmonary exam normal        Cardiovascular hypertension, + CAD and + Peripheral Vascular Disease  Normal cardiovascular exam+ dysrhythmias Atrial Fibrillation      Neuro/Psych PSYCHIATRIC DISORDERS  Neuromuscular disease CVA    GI/Hepatic   Endo/Other  diabetes  Renal/GU      Musculoskeletal   Abdominal (+) + obese,   Peds  Hematology   Anesthesia Other Findings Study Conclusions  - Left ventricle: The cavity size was normal. There was moderate  concentric hypertrophy. Systolic function was normal. The  estimated ejection fraction was in the range of 60% to 65%. Wall  motion was normal; there were no regional wall motion  abnormalities. The study was not technically sufficient to allow  evaluation of LV diastolic dysfunction due to atrial  fibrillation. - Aortic valve: Inadequately visualized bioprosthetic AVR to  comment on structure, but by Doppler appears to be moderate AS  with mild perivalvular AI. There was mild stenosis. There was  moderate regurgitation. - Mitral valve: There was mild regurgitation. - Left atrium: The atrium was severely dilated. - Right atrium: The atrium was severely dilated. - Tricuspid valve: There was moderate regurgitation. - Pulmonary arteries: PA peak pressure: 59 mm Hg (S).  Reproductive/Obstetrics                         Anesthesia Physical Anesthesia Plan  ASA: II  Anesthesia Plan: MAC   Post-op Pain Management:    Induction:   Airway Management Planned:   Additional Equipment:   Intra-op Plan:   Post-operative Plan:   Informed Consent:  I have reviewed the patients History and Physical, chart, labs and discussed the procedure including the risks, benefits and alternatives for the proposed anesthesia with the patient or authorized representative who has indicated his/her understanding and acceptance.     Plan Discussed with: CRNA and Surgeon  Anesthesia Plan Comments:       Anesthesia Quick Evaluation

## 2016-01-17 NOTE — Interval H&P Note (Signed)
History and Physical Interval Note:  01/17/2016 12:00 PM  Harold Jones  has presented today for surgery, with the diagnosis of gastrointestinal bleeding  The various methods of treatment have been discussed with the family. After consideration of risks, benefits and other options for treatment, the patient has consented to  Procedure(s): ESOPHAGOGASTRODUODENOSCOPY (EGD) WITH PROPOFOL (N/A) as a surgical intervention .  The patient's history has been reviewed, patient examined, no change in status (other than improvement in mental status), stable for surgery.  I have reviewed the patient's chart and labs.  Questions were answered to the patient's satisfaction.     [name],Garett Tetzloff V

## 2016-01-17 NOTE — Progress Notes (Signed)
SLP Cancellation Note  Patient Details Name: Beryle BeamsRadomir Kettlewell MRN: [id number] DOB: 02/13/1928   Cancelled treatment:       Reason Eval/Treat Not Completed: Fatigue/lethargy limiting ability to participate. Patient politely declined pos, fatigued following EGD.   Ferdinand LangoLeah Dejana Pugsley MA, CCC-SLP [phone]    Madaline Lefeber Meryl 01/17/2016, 3:03 PM

## 2016-01-17 NOTE — Progress Notes (Signed)
patient has been refusing to have tele monitor placed. MD made aware. Cardiac monitoring order d/c. Will continue to monitor patient

## 2016-01-17 NOTE — Anesthesia Postprocedure Evaluation (Signed)
Anesthesia Post Note  Patient: Korby Riecke  Procedure(s) Performed: Procedure(s) (LRB): ESOPHAGOGASTRODUODENOSCOPY (EGD) WITH PROPOFOL (N/A)  Patient location during evaluation: PACU Anesthesia Type: MAC Level of consciousness: awake Pain management: pain level controlled Vital Signs Assessment: post-procedure vital signs reviewed and stable Respiratory status: spontaneous breathing Cardiovascular status: stable Postop Assessment: no signs of nausea or vomiting Anesthetic complications: no     Last Vitals:  Filed Vitals:   01/17/16 1103 01/17/16 1227  BP: 191/55 157/60  Pulse: 61 50  Temp:    Resp: 19 16    Last Pain:  Filed Vitals:   01/17/16 1228  PainSc: Asleep   Pain Goal:                 Siah Steely JR,JOHN Chin Wachter

## 2016-01-17 NOTE — Progress Notes (Signed)
SLP Cancellation Note  Patient Details Name: Beryle BeamsRadomir Devore MRN: [id number] DOB: 07/18/1927   Cancelled treatment:                                                      Patient at procedure.   Ferdinand LangoLeah Anneth Brunell MA, CCC-SLP [phone]                                                   Ferdinand LangoMcCoy Lawsen Arnott Meryl 01/17/2016, 12:18 PM

## 2016-01-17 NOTE — Anesthesia Procedure Notes (Signed)
Procedure Name: MAC Date/Time: 01/17/2016 12:12 PM Performed by: Carmela RimaMARTINELLI, Ralph Brouwer F Pre-anesthesia Checklist: Timeout performed, Patient being monitored, Suction available, Emergency Drugs available and Patient identified Oxygen Delivery Method: Nasal cannula Placement Confirmation: positive ETCO2

## 2016-01-17 NOTE — Progress Notes (Signed)
  Echocardiogram 2D Echocardiogram has been performed.  Harold SavoyCasey N Memphis Jones 01/17/2016, 8:53 AM

## 2016-01-17 NOTE — Progress Notes (Signed)
Patient's endoscopy was well tolerated, and was unrevealing for a source of blood loss.  Specifically, I did not see any ulcer or other contraindication to resumption of anticoagulation, the relative risks and benefits of which I would defer to the patient's cardiologist.  I have started the patient on a diet.  I would recommend lifelong PPI prophylaxis (once daily dosing should suffice).  I will sign off, but please call if I can be of further assistance in this patient's care, or if you have any questions.  Harold Reasonsobert V. Hana Trippett, M.D. Pager [phone] If no answer or after 5 PM call [phone]

## 2016-01-17 NOTE — Progress Notes (Signed)
PROGRESS NOTE    Harold Jones  XBJ:[id number] DOB: 1927/09/14 DOA: 01/12/2016    Brief Narrative:  Harold Jones is a 82 y.o. male with medical history significant for Afib, aortic valve replacement on Xarelto being admitted for acute blood loss anemia.  7/14 he was awaiting EGD and had an acute episode of agitation and EGD was post-poned.  GI declined to perform procedure until he is less agitated.     Assessment & Plan: GI Bleeding with acute blood loss anemia, no active bleeding for 48-72 hours - DDx includes upper source such as an ulcer or gastritis and lower sources such as AVM, diverticular disease -Continue PPI.   intermittent confusion and agitation has improved.  Patient may need to be sedated for EGD/colonoscopy- Hgb stabilized around 8 (7.4 today), iron is low, CONT  ferraheme    reconsulted Dr Matthias Hughs , tentatively schedule for EGD on 7/18 Cards recommends to proceed, if sedation needed from a cardiac standpoint - NPO ,  Hold IV fluids, chest x-ray concerning for pulmonary edema  Acute on chronic diastolic heart failure, moderate pulmonary hypertension Cardiology following, patient also has aVR,Gradients have been high since 2015 deteriorating tissue AVR but stable Lasix, increase lisinopril to 20 mg a day  Agitation, improved / hx of vascular dementia  - Multifactorial including age, non English speaker and confusion, possible dementia? - Improved today, now off of restraints.    CT negative , likely at baseline cognitively ,  As per the granddaughter DNI , but would like CPR if needed   Dyslipidemia - Continue home atorvastatin    Essential hypertension - SBP trending 130s  -170s, uncontrolled - Currently on home tamsulosin and home lisinopril at half dose, renal function is improving.   Increase lisinopril to 10 mg - baseline CKD stage II  Atrial fibrillation - Heart rate ranging 50s-110s - He is not on a beta blocker or CCB per home meds - Monitor,  goal would be HR < 110 - Xarelto held in the setting of bleeding, cardiology recommends to stop anticoagulation   S/P aortic valve replacement with bioprosthetic valve - Xarelto held in the setting of bleeding, likely okay to continue to hold given it is a bioprosthetic valve, cardiology recommends to discontinue all anticoagulation as he is no longer candidate for long-term anticoagulation 2-D echo results as above  Diabetes mellitus, type 2  - BS ranging 140s-160s - Home glipizide on hold -    If no procedure, would start back home glipizide - Continue SSI, gabapentin   Hemoglobin A1c 8.3  History of stroke - Good BP and sugar control while in house - Holding some home BP medications given acute bleeding.   Dysphagia - Previously noted that he had an MBS with aspiration - SLP today, on dysphagia diet   DVT prophylaxis: SCDs while bleeding Code Status: Full  Family Communication: discussed with Granddaughter, Harold Jones on the phone.  She noted that her mother would be able to come up   if EGD planned, Call grandaughter and update her about all plan of care   Disposition Plan: Pending EGD, anticipate discharge tomorrow   Consultants:   GI next) cardiology  Procedures:   None to date  Antimicrobials:   None    Subjective: Patient more calm today,  does not seem to be in any pain or distress   Objective: Filed Vitals:   01/16/16 1626 01/16/16 2046 01/17/16 0542 01/17/16 0728  BP: 157/54 133/89 177/55 198/63  Pulse: 57 88 57  57  Temp: 98.4 F (36.9 C) 98.5 F (36.9 C) 98.6 F (37 C) 99.2 F (37.3 C)  TempSrc: Axillary Oral Oral Oral  Resp: 18 19 18 18   Height:      Weight:  94.2 kg (207 lb 10.8 oz)    SpO2: 100% 98% 96% 95%    Intake/Output Summary (Last 24 hours) at 01/17/16 0821 Last data filed at 01/16/16 2144  Gross per 24 hour  Intake    120 ml  Output    875 ml  Net   -755 ml   Filed Weights   01/12/16 2008 01/15/16 2024 01/16/16 2046    Weight: 94.4 kg (208 lb 1.8 oz) 94 kg (207 lb 3.7 oz) 94.2 kg (207 lb 10.8 oz)    Examination:  General exam: Appears calm and comfortable, no restraints HENT: NCAT, neck is supple   Respiratory system: Respiratory effort normal.  No audible wheezing Cardiovascular system: S1 & S2 heard, RR, NR. Gastrointestinal system: Abdomen is nondistended, soft and nontender. +BS Skin: Seborrheic keratosis noted on face/head Psychiatry: insight is low at this time, mood is calmer today    Data Reviewed:   CBC:  Recent Labs Lab 01/12/16 0841 01/13/16 0414 01/14/16 1045 01/15/16 0755 01/16/16 0450 01/17/16 0608  WBC 6.3 6.5 9.0 7.9 6.2 6.6  NEUTROABS 4.5  --  6.6  --   --   --   HGB 6.0* 7.3* 8.1* 7.8* 7.5* 7.4*  HCT 19.8* 23.2* 26.6* 25.1* 24.7* 24.7*  MCV 79.8 80.8 80.4 81.2 82.6 83.2  PLT 185 176 200 197 190 212   Basic Metabolic Panel:  Recent Labs Lab 01/12/16 0841 01/13/16 0414 01/14/16 1045 01/15/16 0755 01/17/16 0608  NA 138 137 137 140 144  K 4.8 5.3* 3.9 3.7 3.6  CL 107 108 111 115* 119*  CO2 24 21* 17* 20* 20*  GLUCOSE 178* 113* 169* 146* 152*  BUN 50* 40* 28* 19 17  CREATININE 1.95* 1.59* 1.47* 1.34* 1.28*  CALCIUM 9.1 8.4* 8.7* 8.5* 8.4*   GFR: Estimated Creatinine Clearance: 47.8 mL/min (by C-G formula based on Cr of 1.28). Liver Function Tests:  Recent Labs Lab 01/12/16 0841 01/17/16 0608  AST 13* 15  ALT 13* 13*  ALKPHOS 32* 34*  BILITOT 0.9 1.1  PROT 6.7 6.2*  ALBUMIN 3.5 3.0*    Recent Labs Lab 01/12/16 0841  LIPASE 19    Recent Labs Lab 01/16/16 0912  AMMONIA 27   Coagulation Profile: No results for input(s): INR, PROTIME in the last 168 hours. Cardiac Enzymes:  Recent Labs Lab 01/12/16 0841  TROPONINI 0.03*   BNP (last 3 results) No results for input(s): PROBNP in the last 8760 hours. HbA1C: No results for input(s): HGBA1C in the last 72 hours. CBG:  Recent Labs Lab 01/16/16 1612 01/16/16 2045 01/17/16 0014  01/17/16 0448 01/17/16 0726  GLUCAP 141* 119* 159* 147* 150*     Recent Results (from the past 240 hour(s))  MRSA PCR Screening     Status: None   Collection Time: 01/12/16  4:23 PM  Result Value Ref Range Status   MRSA by PCR NEGATIVE NEGATIVE Final    Comment:        The GeneXpert MRSA Assay (FDA approved for NASAL specimens only), is one component of a comprehensive MRSA colonization surveillance program. It is not intended to diagnose MRSA infection nor to guide or monitor treatment for MRSA infections.          Radiology Studies: Ct  Head Wo Contrast  01/16/2016  CLINICAL DATA:  82 year old male presenting with confusion. Acute blood loss resulting in anemia. EXAM: CT HEAD WITHOUT CONTRAST TECHNIQUE: Contiguous axial images were obtained from the base of the skull through the vertex without intravenous contrast. COMPARISON:  Head CT 08/16/2015. FINDINGS: Mild to moderate cerebral atrophy. Patchy and confluent areas of decreased attenuation are noted throughout the deep and periventricular white matter of the cerebral hemispheres bilaterally, compatible with chronic microvascular ischemic disease. Old lacunar infarct in the left cerebellar hemisphere is unchanged. No acute intracranial abnormalities. Specifically, no evidence of acute intracranial hemorrhage, no definite findings of acute/subacute cerebral ischemia, no mass, mass effect, hydrocephalus or abnormal intra or extra-axial fluid collections. Visualized paranasal sinuses and mastoids are well pneumatized. No acute displaced skull fractures are identified. IMPRESSION: 1. No acute intracranial abnormalities. 2. Extensive chronic microvascular ischemic changes throughout the cerebral white matter, and old left cerebellar infarct redemonstrated, as above. 3. Mild to moderate cerebral atrophy. Electronically Signed   By: Trudie Reed M.D.   On: 01/16/2016 09:59   Dg Chest Port 1 View  01/16/2016  CLINICAL DATA:  Altered  mental status. EXAM: PORTABLE CHEST 1 VIEW COMPARISON:  Radiographs of August 17, 2015. FINDINGS: Stable cardiomegaly. Sternotomy wires are noted. Left-sided pacemaker is unchanged in position. Increased central pulmonary vascular congestion and bilateral interstitial densities are noted concerning for pulmonary edema. No pneumothorax is noted. No significant pleural effusion is noted. Bony thorax is unremarkable. IMPRESSION: Increased central pulmonary vascular congestion and bilateral pulmonary edema is noted. Electronically Signed   By: Lupita Raider, M.D.   On: 01/16/2016 12:20        Scheduled Meds: . sodium chloride   Intravenous Once  . atorvastatin  10 mg Oral Daily  . docusate sodium  100 mg Oral BID  . gabapentin  300 mg Oral BID  . insulin aspart  0-15 Units Subcutaneous Q4H  . lisinopril  5 mg Oral Daily  . pantoprazole  40 mg Oral BID AC  . polyethylene glycol  17 g Oral Daily  . tamsulosin  0.4 mg Oral QPC supper   Continuous Infusions: . sodium chloride 125 mL/hr at 01/16/16 1004  . sodium chloride    . sodium chloride       LOS: 5 days    Time spent: 25 minutes    Richarda Overlie, MD [facility] Pager [phone]  If 7PM-7AM, please contact night-coverage [email or web address] Password [email or web address] 01/17/2016, 8:21 AM

## 2016-01-17 NOTE — Transfer of Care (Signed)
Immediate Anesthesia Transfer of Care Note  Patient: Harold Jones  Procedure(s) Performed: Procedure(s): ESOPHAGOGASTRODUODENOSCOPY (EGD) WITH PROPOFOL (N/A)  Patient Location: Endoscopy Unit  Anesthesia Type:MAC  Level of Consciousness: awake  Airway & Oxygen Therapy: Patient Spontanous Breathing and Patient connected to nasal cannula oxygen  Post-op Assessment: Report given to RN, Post -op Vital signs reviewed and stable and Patient moving all extremities X 4  Post vital signs: Reviewed and stable  Last Vitals:  Filed Vitals:   01/17/16 0728 01/17/16 1103  BP: 198/63 191/55  Pulse: 57 61  Temp: 37.3 C   Resp: 18 19    Last Pain:  Filed Vitals:   01/17/16 1104  PainSc: Asleep         Complications: No apparent anesthesia complications

## 2016-01-18 ENCOUNTER — Encounter (HOSPITAL_COMMUNITY): Payer: Self-pay | Admitting: Gastroenterology

## 2016-01-18 ENCOUNTER — Inpatient Hospital Stay (HOSPITAL_COMMUNITY): Payer: Medicare Other

## 2016-01-18 LAB — COMPREHENSIVE METABOLIC PANEL
ALBUMIN: 2.7 g/dL — AB (ref 3.5–5.0)
ALK PHOS: 33 U/L — AB (ref 38–126)
ALT: 11 U/L — AB (ref 17–63)
ANION GAP: 8 (ref 5–15)
AST: 14 U/L — ABNORMAL LOW (ref 15–41)
BUN: 19 mg/dL (ref 6–20)
CHLORIDE: 115 mmol/L — AB (ref 101–111)
CO2: 20 mmol/L — AB (ref 22–32)
Calcium: 8.5 mg/dL — ABNORMAL LOW (ref 8.9–10.3)
Creatinine, Ser: 1.21 mg/dL (ref 0.61–1.24)
GFR calc non Af Amer: 52 mL/min — ABNORMAL LOW (ref >60)
GLUCOSE: 171 mg/dL — AB (ref 65–99)
Potassium: 3.6 mmol/L (ref 3.5–5.1)
SODIUM: 143 mmol/L (ref 135–145)
Total Bilirubin: 1 mg/dL (ref 0.3–1.2)
Total Protein: 5.9 g/dL — ABNORMAL LOW (ref 6.5–8.1)

## 2016-01-18 LAB — CBC
HCT: 21.4 % — ABNORMAL LOW (ref 39.0–52.0)
HEMOGLOBIN: 6.8 g/dL — AB (ref 13.0–17.0)
MCH: 25.5 pg — AB (ref 26.0–34.0)
MCHC: 31.8 g/dL (ref 30.0–36.0)
MCV: 80.1 fL (ref 78.0–100.0)
PLATELETS: 182 10*3/uL (ref 150–400)
RBC: 2.67 MIL/uL — AB (ref 4.22–5.81)
RDW: 15.4 % (ref 11.5–15.5)
WBC: 5.6 10*3/uL (ref 4.0–10.5)

## 2016-01-18 LAB — GLUCOSE, CAPILLARY
GLUCOSE-CAPILLARY: 184 mg/dL — AB (ref 65–99)
Glucose-Capillary: 166 mg/dL — ABNORMAL HIGH (ref 65–99)
Glucose-Capillary: 262 mg/dL — ABNORMAL HIGH (ref 65–99)
Glucose-Capillary: 236 mg/dL — ABNORMAL HIGH (ref 65–99)

## 2016-01-18 LAB — PREPARE RBC (CROSSMATCH)

## 2016-01-18 LAB — HEMOGLOBIN AND HEMATOCRIT, BLOOD
HEMATOCRIT: 26.5 % — AB (ref 39.0–52.0)
HEMOGLOBIN: 8.2 g/dL — AB (ref 13.0–17.0)

## 2016-01-18 MED ORDER — FUROSEMIDE 10 MG/ML IJ SOLN
40.0000 mg | Freq: Once | INTRAMUSCULAR | Status: AC
  Administered 2016-01-18: 40 mg via INTRAVENOUS
  Filled 2016-01-18: qty 4

## 2016-01-18 MED ORDER — SODIUM CHLORIDE 0.9 % IV SOLN
Freq: Once | INTRAVENOUS | Status: AC
  Administered 2016-01-18: 15:00:00 via INTRAVENOUS

## 2016-01-18 NOTE — Progress Notes (Signed)
Speech Language Pathology Treatment: Dysphagia  Patient Details Name: Harold Jones MRN: [id number] DOB: 12/16/1927 Today's Date: 01/18/2016 Time: [id number] SLP Time Calculation (min) (ACUTE ONLY): 10 min  Assessment / Plan / Recommendation Clinical Impression  Dysphagia treatment provided today for diet tolerance. Noted that diet had been upgraded to regular/ thin liquids; previous SLP recommendations were for dysphagia 2/ nectar thick liquids. RN reported he has continued to provide nectar-thick liquids and that pt has appeared to tolerate well. Pt had an immediate cough response when taking large sips of nectar-thick liquids, no cough response when pt was cued to take small sips at a time. No s/s of aspiration noted with trials of puree consistency; pt politely declined trial of solid. Previous MBS in November 2016 showed that pt had trace silent aspiration of thin liquids and penetration of nectar-thick liquids. While pt does not show s/s of aspiration when controlling sip size, silent aspiration cannot be ruled out at bedside.  Recommend proceeding with MBS to provide further recommendations. Until then, recommend nectar thick liquids/ dysphagia 2 diet, meds whole in puree, full supervision during meals to cue for small bites/ sips.    HPI HPI: Harold Jones is a 82 y.o. male with medical history significant for Afib, aortic valve replacement on Xarelto being admitted for acute blood loss anemia.       SLP Plan  MBS     Recommendations  Diet recommendations: Dysphagia 2 (fine chop);Nectar-thick liquid Liquids provided via: Cup;Straw Medication Administration: Whole meds with puree Supervision: Patient able to self feed;Full supervision/cueing for compensatory strategies Compensations: Slow rate;Small sips/bites Postural Changes and/or Swallow Maneuvers: Seated upright 90 degrees;Upright 30-60 min after meal             Oral Care Recommendations: Oral care BID Follow up  Recommendations: 24 hour supervision/assistance Plan: MBS     GO                Metro KungOleksiak, Amy K, MA, CCC-SLP 01/18/2016, 10:31 AM [id number]

## 2016-01-18 NOTE — Progress Notes (Signed)
CRITICAL VALUE ALERT  Critical value received:  Hbg 6.8  Date of notification:  01/18/2016  Time of notification:  7:35 AM  Critical value read back:Yes.    Nurse who received alert:  Janeann ForehandLuke Shabazz Mckey  MD notified (1st page):  Dr. Susie CassetteAbrol  Time of first page:  [phone]  MD notified (2nd page):  Time of second page:  Responding MD:  Dr. Susie CassetteAbrol  Time MD responded:  [phone]

## 2016-01-18 NOTE — Progress Notes (Signed)
I attempted to utilize the interpreter service to inform patient that I needed to give him more blood. I at the end I asked the patient if he understood and he said "ok,it's good." I also asked if he had any questions for me and he said the same thing.

## 2016-01-18 NOTE — Care Management Important Message (Signed)
Important Message  Patient Details  Name: Beryle BeamsRadomir Seoane MRN: [id number] Date of Birth: 09/12/1927   Medicare Important Message Given:  Yes    Bernadette HoitShoffner, Bartow Zylstra Coleman 01/18/2016, 10:11 AM

## 2016-01-18 NOTE — Progress Notes (Signed)
OT Cancellation Note  Patient Details Name: Beryle BeamsRadomir Wainer MRN: [id number] DOB: 02/12/1928   Cancelled Treatment:    Reason Eval/Treat Not Completed: Medical issues which prohibited therapy. Pt with low Hgb and is scheduled to recieve blood. Will re attempt later as appropriate/as time allows  Galen ManilaSpencer, Mathew Postiglione Jeanette 01/18/2016, 1:21 PM

## 2016-01-18 NOTE — Progress Notes (Signed)
PROGRESS NOTE    Harold Jones  ZOX:[id number] DOB: 07/15/1927 DOA: 01/12/2016    Brief Narrative:  Harold Jones is a 82 y.o. male with medical history significant for Afib, aortic valve replacement on Xarelto being admitted for acute blood loss anemia.  7/14 he was awaiting EGD and had an acute episode of agitation and EGD was post-poned.  GI declined to perform procedure until he is less agitated.  Agitation improved and the patient had EGD on 7/18   Assessment & Plan: GI Bleeding with acute blood loss anemia, drop in hemoglobin to 6.8 on 7/19 - DDx includes upper source such as an ulcer or gastritis and lower sources such as AVM, diverticular disease -Continue PPI.   intermittent confusion and agitation has improved.   Endoscopy did not show any ulcer, however hemoglobin dropped from 7.5-6.8  Discontinue anticoagulation, lifelong PPI, We'll transfuse 2 units of packed red blood cells   Acute on chronic diastolic heart failure, moderate pulmonary hypertension Cardiology following, patient also has aVR,Gradients have been high since 2015 deteriorating tissue AVR but stable Lasix continue lisinopril to 20 mg a day  Agitation, improved / hx of vascular dementia  - Multifactorial including age, non English speaker and confusion, possible dementia? - Improved today, now off of restraints.    CT negative , likely at baseline cognitively ,  As per the granddaughter DNI , but would like CPR if needed   Dyslipidemia - Continue home atorvastatin    Essential hypertension - SBP trending 130s  -170s, uncontrolled - Currently on home tamsulosin and home lisinopril at half dose, renal function is improving.   Increase lisinopril to 10 mg - baseline CKD stage II  Atrial fibrillation - Heart rate ranging 50s-110s - He is not on a beta blocker or CCB per home meds - Monitor, goal would be HR < 110 - Xarelto held in the setting of bleeding, cardiology recommends to stop  anticoagulation   S/P aortic valve replacement with bioprosthetic valve - Xarelto held in the setting of bleeding, likely okay to continue to hold given it is a bioprosthetic valve, cardiology recommends to discontinue all anticoagulation as he is no longer candidate for long-term anticoagulation 2-D echo results as above  Diabetes mellitus, type 2  - BS ranging 140s-160s - Home glipizide on hold -    If no procedure, would start back home glipizide - Continue SSI, gabapentin   Hemoglobin A1c 8.3  History of stroke - Good BP and sugar control while in house - Holding some home BP medications given acute bleeding.   Dysphagia - Previously noted that he had an MBS with aspiration - SLP today, on dysphagia diet   DVT prophylaxis: SCDs while bleeding Code Status: Full  Family Communication: discussed with Granddaughter, Ammie Ferrier on the phone.  She noted that her mother would be able to come up   if EGD planned, Call grandaughter and update her about all plan of care   Disposition Plan: Transfusions today, anticipate discharge tomorrow   Consultants:   GI    cardiology  Procedures:   None to date  Antimicrobials:   None    Subjective: Patient more calm today,  does not seem to be in any pain or distress   Objective: Filed Vitals:   01/17/16 1230 01/17/16 1240 01/17/16 1753 01/17/16 2309  BP: 157/60 196/72 150/64 140/73  Pulse: 63 48 63 74  Temp:   98.4 F (36.9 C) 98.8 F (37.1 C)  TempSrc:   Oral Oral  Resp: 20 19 18 18   Height:      Weight:      SpO2: 98% 94% 96% 97%    Intake/Output Summary (Last 24 hours) at 01/18/16 0758 Last data filed at 01/18/16 0600  Gross per 24 hour  Intake    660 ml  Output   1350 ml  Net   -690 ml   Filed Weights   01/12/16 2008 01/15/16 2024 01/16/16 2046  Weight: 94.4 kg (208 lb 1.8 oz) 94 kg (207 lb 3.7 oz) 94.2 kg (207 lb 10.8 oz)    Examination:  General exam: Appears calm and comfortable, no restraints HENT:  NCAT, neck is supple   Respiratory system: Respiratory effort normal.  No audible wheezing Cardiovascular system: S1 & S2 heard, RR, NR. Gastrointestinal system: Abdomen is nondistended, soft and nontender. +BS Skin: Seborrheic keratosis noted on face/head Psychiatry: insight is low at this time, mood is calmer today    Data Reviewed:   CBC:  Recent Labs Lab 01/12/16 0841  01/14/16 1045 01/15/16 0755 01/16/16 0450 01/17/16 0608 01/18/16 0546  WBC 6.3  < > 9.0 7.9 6.2 6.6 5.6  NEUTROABS 4.5  --  6.6  --   --   --   --   HGB 6.0*  < > 8.1* 7.8* 7.5* 7.4* 6.8*  HCT 19.8*  < > 26.6* 25.1* 24.7* 24.7* 21.4*  MCV 79.8  < > 80.4 81.2 82.6 83.2 80.1  PLT 185  < > 200 197 190 212 182  < > = values in this interval not displayed. Basic Metabolic Panel:  Recent Labs Lab 01/13/16 0414 01/14/16 1045 01/15/16 0755 01/17/16 0608 01/18/16 0546  NA 137 137 140 144 143  K 5.3* 3.9 3.7 3.6 3.6  CL 108 111 115* 119* 115*  CO2 21* 17* 20* 20* 20*  GLUCOSE 113* 169* 146* 152* 171*  BUN 40* 28* 19 17 19   CREATININE 1.59* 1.47* 1.34* 1.28* 1.21  CALCIUM 8.4* 8.7* 8.5* 8.4* 8.5*   GFR: Estimated Creatinine Clearance: 50.5 mL/min (by C-G formula based on Cr of 1.21). Liver Function Tests:  Recent Labs Lab 01/12/16 0841 01/17/16 0608 01/18/16 0546  AST 13* 15 14*  ALT 13* 13* 11*  ALKPHOS 32* 34* 33*  BILITOT 0.9 1.1 1.0  PROT 6.7 6.2* 5.9*  ALBUMIN 3.5 3.0* 2.7*    Recent Labs Lab 01/12/16 0841  LIPASE 19    Recent Labs Lab 01/16/16 0912  AMMONIA 27   Coagulation Profile: No results for input(s): INR, PROTIME in the last 168 hours. Cardiac Enzymes:  Recent Labs Lab 01/12/16 0841  TROPONINI 0.03*   BNP (last 3 results) No results for input(s): PROBNP in the last 8760 hours. HbA1C: No results for input(s): HGBA1C in the last 72 hours. CBG:  Recent Labs Lab 01/17/16 0448 01/17/16 0726 01/17/16 1303 01/17/16 1625 01/17/16 2231  GLUCAP 147* 150* 144*  167* 196*     Recent Results (from the past 240 hour(s))  MRSA PCR Screening     Status: None   Collection Time: 01/12/16  4:23 PM  Result Value Ref Range Status   MRSA by PCR NEGATIVE NEGATIVE Final    Comment:        The GeneXpert MRSA Assay (FDA approved for NASAL specimens only), is one component of a comprehensive MRSA colonization surveillance program. It is not intended to diagnose MRSA infection nor to guide or monitor treatment for MRSA infections.          Radiology  Studies: Ct Head Wo Contrast  01/16/2016  CLINICAL DATA:  82 year old male presenting with confusion. Acute blood loss resulting in anemia. EXAM: CT HEAD WITHOUT CONTRAST TECHNIQUE: Contiguous axial images were obtained from the base of the skull through the vertex without intravenous contrast. COMPARISON:  Head CT 08/16/2015. FINDINGS: Mild to moderate cerebral atrophy. Patchy and confluent areas of decreased attenuation are noted throughout the deep and periventricular white matter of the cerebral hemispheres bilaterally, compatible with chronic microvascular ischemic disease. Old lacunar infarct in the left cerebellar hemisphere is unchanged. No acute intracranial abnormalities. Specifically, no evidence of acute intracranial hemorrhage, no definite findings of acute/subacute cerebral ischemia, no mass, mass effect, hydrocephalus or abnormal intra or extra-axial fluid collections. Visualized paranasal sinuses and mastoids are well pneumatized. No acute displaced skull fractures are identified. IMPRESSION: 1. No acute intracranial abnormalities. 2. Extensive chronic microvascular ischemic changes throughout the cerebral white matter, and old left cerebellar infarct redemonstrated, as above. 3. Mild to moderate cerebral atrophy. Electronically Signed   By: Trudie Reed M.D.   On: 01/16/2016 09:59   Dg Chest Port 1 View  01/16/2016  CLINICAL DATA:  Altered mental status. EXAM: PORTABLE CHEST 1 VIEW COMPARISON:   Radiographs of August 17, 2015. FINDINGS: Stable cardiomegaly. Sternotomy wires are noted. Left-sided pacemaker is unchanged in position. Increased central pulmonary vascular congestion and bilateral interstitial densities are noted concerning for pulmonary edema. No pneumothorax is noted. No significant pleural effusion is noted. Bony thorax is unremarkable. IMPRESSION: Increased central pulmonary vascular congestion and bilateral pulmonary edema is noted. Electronically Signed   By: Lupita Raider, M.D.   On: 01/16/2016 12:20        Scheduled Meds: . sodium chloride   Intravenous Once  . sodium chloride   Intravenous Once  . atorvastatin  10 mg Oral Daily  . docusate sodium  100 mg Oral BID  . furosemide  40 mg Intravenous Once  . furosemide  40 mg Intravenous Once  . gabapentin  300 mg Oral BID  . insulin aspart  0-15 Units Subcutaneous TID AC & HS  . lisinopril  20 mg Oral Daily  . pantoprazole  40 mg Oral BID AC  . polyethylene glycol  17 g Oral Daily  . tamsulosin  0.4 mg Oral QPC supper   Continuous Infusions:     LOS: 6 days    Time spent: 25 minutes    Richarda Overlie, MD [facility] Pager [phone]  If 7PM-7AM, please contact night-coverage [email or web address] Password [facility] 01/18/2016, 7:58 AM

## 2016-01-18 NOTE — Progress Notes (Signed)
PT Cancellation Note  Patient Details Name: Harold Jones MRN: [id number] DOB: 11/25/1927   Cancelled Treatment:    Reason Eval/Treat Not Completed: Medical issues which prohibited therapy (Pt has very low hgb and will defer until later, as tolerated).  Will check and see how pt is doing.   Ivar DrapeStout, Altie Savard E 01/18/2016, 10:47 AM    Samul Dadauth Thorne Wirz, PT MS Acute Rehab Dept. Number: [facility] [phone] and [facility] [phone]

## 2016-01-19 ENCOUNTER — Inpatient Hospital Stay (HOSPITAL_COMMUNITY): Payer: Medicare Other

## 2016-01-19 DIAGNOSIS — E1100 Type 2 diabetes mellitus with hyperosmolarity without nonketotic hyperglycemic-hyperosmolar coma (NKHHC): Secondary | ICD-10-CM

## 2016-01-19 DIAGNOSIS — R404 Transient alteration of awareness: Secondary | ICD-10-CM

## 2016-01-19 LAB — TYPE AND SCREEN
ABO/RH(D): AB POS
Antibody Screen: NEGATIVE
Unit division: 0
Unit division: 0

## 2016-01-19 LAB — COMPREHENSIVE METABOLIC PANEL
ALBUMIN: 2.9 g/dL — AB (ref 3.5–5.0)
ALK PHOS: 34 U/L — AB (ref 38–126)
ALT: 11 U/L — AB (ref 17–63)
AST: 15 U/L (ref 15–41)
Anion gap: 6 (ref 5–15)
BUN: 23 mg/dL — ABNORMAL HIGH (ref 6–20)
CALCIUM: 8.6 mg/dL — AB (ref 8.9–10.3)
CHLORIDE: 115 mmol/L — AB (ref 101–111)
CO2: 21 mmol/L — AB (ref 22–32)
CREATININE: 1.36 mg/dL — AB (ref 0.61–1.24)
GFR calc Af Amer: 53 mL/min — ABNORMAL LOW (ref >60)
GFR calc non Af Amer: 45 mL/min — ABNORMAL LOW (ref >60)
GLUCOSE: 196 mg/dL — AB (ref 65–99)
Potassium: 3.7 mmol/L (ref 3.5–5.1)
SODIUM: 142 mmol/L (ref 135–145)
Total Bilirubin: 1.5 mg/dL — ABNORMAL HIGH (ref 0.3–1.2)
Total Protein: 5.6 g/dL — ABNORMAL LOW (ref 6.5–8.1)

## 2016-01-19 LAB — CBC
HCT: 28 % — ABNORMAL LOW (ref 39.0–52.0)
HEMOGLOBIN: 8.7 g/dL — AB (ref 13.0–17.0)
MCH: 25.1 pg — ABNORMAL LOW (ref 26.0–34.0)
MCHC: 31.1 g/dL (ref 30.0–36.0)
MCV: 80.9 fL (ref 78.0–100.0)
Platelets: 164 10*3/uL (ref 150–400)
RBC: 3.46 MIL/uL — AB (ref 4.22–5.81)
RDW: 15.1 % (ref 11.5–15.5)
WBC: 6.4 10*3/uL (ref 4.0–10.5)

## 2016-01-19 LAB — GLUCOSE, CAPILLARY
Glucose-Capillary: 198 mg/dL — ABNORMAL HIGH (ref 65–99)
Glucose-Capillary: 225 mg/dL — ABNORMAL HIGH (ref 65–99)

## 2016-01-19 MED ORDER — PANTOPRAZOLE SODIUM 40 MG PO TBEC: 40.0000 mg | DELAYED_RELEASE_TABLET | Freq: Every day | ORAL | Status: DC

## 2016-01-19 NOTE — Progress Notes (Signed)
Transport here to pick up the patient. Called the interpreter. Spoke to BenbrookNada [phone]) to tell the patient that he is being discharged to a nursing home. Per Kalman ShanNada, patient verbalized understanding.

## 2016-01-19 NOTE — Discharge Summary (Addendum)
Physician Discharge Summary  Harold Jones MRN: [id number] DOB/AGE: [birth year] 82 y.o.  PCP: No primary care provider on file.   Admit date: 01/12/2016 Discharge date: 01/19/2016  Discharge Diagnoses:     Active Problems:   Dyslipidemia   Essential hypertension   FIBRILLATION, ATRIAL   H/O aortic valve replacement   Diabetes mellitus, type 2 (HCC)   History of stroke   S/P aortic valve replacement with bioprosthetic valve   GI bleed   GIB (gastrointestinal bleeding)   Acute GI bleeding   Altered mental state   Confusion    Follow-up recommendations Follow-up with PCP in 3-5 days , including all  additional recommended appointments as below Follow-up CBC, CMP in 3-5 days  Diet recommendations: Dysphagia 2 (fine chop);Nectar-thick liquid Liquids provided via: Cup;Straw Medication Administration: Whole meds with puree Supervision: Patient able to self feed;Full supervision/cueing for compensatory strategies Compensations: Slow rate;Small sips/bites Postural Changes and/or Swallow Maneuvers: Seated upright 90 degrees;Upright 30-60      Current Discharge Medication List    START taking these medications   Details  pantoprazole (PROTONIX) 40 MG tablet Take 1 tablet (40 mg total) by mouth daily. Qty: 40 tablet, Refills: 0      CONTINUE these medications which have NOT CHANGED   Details  acetaminophen (TYLENOL) 325 MG tablet Take 650 mg by mouth 2 (two) times daily.     atorvastatin (LIPITOR) 10 MG tablet Take 10 mg by mouth daily.    docusate sodium (COLACE) 100 MG capsule Take 100 mg by mouth 2 (two) times daily.    furosemide (LASIX) 20 MG tablet Take 20 mg by mouth daily. Reported on 08/17/2015    gabapentin (NEURONTIN) 300 MG capsule Take 300 mg by mouth 2 (two) times daily.    glipiZIDE (GLUCOTROL) 5 MG tablet Take 5 mg by mouth daily before breakfast.     lisinopril (PRINIVIL,ZESTRIL) 20 MG tablet Take 20 mg by mouth daily.    polyethylene glycol  (MIRALAX / GLYCOLAX) packet Take 17 g by mouth daily.    tamsulosin (FLOMAX) 0.4 MG CAPS capsule Take 0.4 mg by mouth daily after supper. 30 minutes after the same meal every day. Refills: 3      STOP taking these medications     Rivaroxaban (XARELTO) 15 MG TABS tablet          Discharge Condition: *Stable   Discharge Instructions Get Medicines reviewed and adjusted: Please take all your medications with you for your next visit with your Primary MD  Please request your Primary MD to go over all hospital tests and procedure/radiological results at the follow up, please ask your Primary MD to get all Hospital records sent to his/her office.  If you experience worsening of your admission symptoms, develop shortness of breath, life threatening emergency, suicidal or homicidal thoughts you must seek medical attention immediately by calling 911 or calling your MD immediately if symptoms less severe.  You must read complete instructions/literature along with all the possible adverse reactions/side effects for all the Medicines you take and that have been prescribed to you. Take any new Medicines after you have completely understood and accpet all the possible adverse reactions/side effects.   Do not drive when taking Pain medications.   Do not take more than prescribed Pain, Sleep and Anxiety Medications  Special Instructions: If you have smoked or chewed Tobacco in the last 2 yrs please stop smoking, stop any regular Alcohol and or any Recreational drug use.  Wear Seat belts while  driving.  Please note  You were cared for by a hospitalist during your hospital stay. Once you are discharged, your primary care physician will handle any further medical issues. Please note that NO REFILLS for any discharge medications will be authorized once you are discharged, as it is imperative that you return to your primary care physician (or establish a relationship with a primary care physician if  you do not have one) for your aftercare needs so that they can reassess your need for medications and monitor your lab values.     No Known Allergies    Disposition: 03-Skilled Nursing Facility   Consults: * Cardiology Gastroenterology     Significant Diagnostic Studies:  Ct Abdomen Pelvis Wo Contrast  01/12/2016  CLINICAL DATA:  Abdominal pain with nausea vomiting. EXAM: CT ABDOMEN AND PELVIS WITHOUT CONTRAST TECHNIQUE: Multidetector CT imaging of the abdomen and pelvis was performed following the standard protocol without IV contrast. COMPARISON:  None. FINDINGS: Of note, study is degraded by patient motion. Lower chest: 8 mm right middle lobe pulmonary nodule seen on image 6 series 205. 4 mm right lower lobe pulmonary nodule identified on the same image. Hepatobiliary: No focal abnormality in the liver on this study without intravenous contrast. No evidence of hepatomegaly. Gallbladder is surgically absent. No intrahepatic or extrahepatic biliary dilation. Pancreas: No focal mass lesion. No dilatation of the main duct. No intraparenchymal cyst. No peripancreatic edema. Spleen: No splenomegaly. No focal mass lesion. Adrenals/Urinary Tract: No adrenal nodule or mass. Low-density lesions seen in the kidneys bilaterally approach water attenuation are likely cysts. Dominant lesion on the right is 2.5 cm and dominant lesion on the left is lower pole measuring 5.1 cm. No overt hydronephrosis in either kidney. No evidence for hydroureter. Small left-sided diverticulum noted in otherwise unremarkable appearing urinary bladder. Stomach/Bowel: Stomach is nondistended. No gastric wall thickening. No evidence of outlet obstruction. Duodenum is normally positioned as is the ligament of Treitz. No small bowel wall thickening. No small bowel dilatation. The terminal ileum is normal. The appendix is normal. No gross colonic mass. No colonic wall thickening. No substantial diverticular change.  Vascular/Lymphatic: There is abdominal aortic atherosclerosis without aneurysm. There is no gastrohepatic or hepatoduodenal ligament lymphadenopathy. No intraperitoneal or retroperitoneal lymphadenopathy. 10 mm short axis upper normal left para-aortic lymph node identified. No pelvic sidewall lymphadenopathy. Reproductive: The prostate gland and seminal vesicles have normal imaging features. Other: No intraperitoneal free fluid. Musculoskeletal: Degenerative changes are noted in the lower lumbar spine. IMPRESSION: 1. No acute abnormality identified. Specifically, no findings to explain the patient's history of abdominal pain with nausea vomiting. 2. 8 mm right middle lobe pulmonary nodule. Non-contrast chest CT at 6-12 months is recommended. If the nodule is stable at time of repeat CT, then future CT at 18-24 months (from today's scan) is considered optional for low-risk patients, but is recommended for high-risk patients. This recommendation follows the consensus statement: Guidelines for Management of Incidental Pulmonary Nodules Detected on CT Images:From the Fleischner Society 2017; published online before print (10.1148/radiol.7106269485). Probable bilateral renal cysts. Electronically Signed   By: Misty Stanley M.D.   On: 01/12/2016 12:07   Ct Head Wo Contrast  01/16/2016  CLINICAL DATA:  82 year old male presenting with confusion. Acute blood loss resulting in anemia. EXAM: CT HEAD WITHOUT CONTRAST TECHNIQUE: Contiguous axial images were obtained from the base of the skull through the vertex without intravenous contrast. COMPARISON:  Head CT 08/16/2015. FINDINGS: Mild to moderate cerebral atrophy. Patchy and confluent areas of  decreased attenuation are noted throughout the deep and periventricular white matter of the cerebral hemispheres bilaterally, compatible with chronic microvascular ischemic disease. Old lacunar infarct in the left cerebellar hemisphere is unchanged. No acute intracranial  abnormalities. Specifically, no evidence of acute intracranial hemorrhage, no definite findings of acute/subacute cerebral ischemia, no mass, mass effect, hydrocephalus or abnormal intra or extra-axial fluid collections. Visualized paranasal sinuses and mastoids are well pneumatized. No acute displaced skull fractures are identified. IMPRESSION: 1. No acute intracranial abnormalities. 2. Extensive chronic microvascular ischemic changes throughout the cerebral white matter, and old left cerebellar infarct redemonstrated, as above. 3. Mild to moderate cerebral atrophy. Electronically Signed   By: Vinnie Langton M.D.   On: 01/16/2016 09:59   Dg Chest Port 1 View  01/16/2016  CLINICAL DATA:  Altered mental status. EXAM: PORTABLE CHEST 1 VIEW COMPARISON:  Radiographs of August 17, 2015. FINDINGS: Stable cardiomegaly. Sternotomy wires are noted. Left-sided pacemaker is unchanged in position. Increased central pulmonary vascular congestion and bilateral interstitial densities are noted concerning for pulmonary edema. No pneumothorax is noted. No significant pleural effusion is noted. Bony thorax is unremarkable. IMPRESSION: Increased central pulmonary vascular congestion and bilateral pulmonary edema is noted. Electronically Signed   By: Marijo Conception, M.D.   On: 01/16/2016 12:20        Filed Weights   01/15/16 2024 01/16/16 2046 01/18/16 2027  Weight: 94 kg (207 lb 3.7 oz) 94.2 kg (207 lb 10.8 oz) 86.592 kg (190 lb 14.4 oz)     Microbiology: Recent Results (from the past 240 hour(s))  MRSA PCR Screening     Status: None   Collection Time: 01/12/16  4:23 PM  Result Value Ref Range Status   MRSA by PCR NEGATIVE NEGATIVE Final    Comment:        The GeneXpert MRSA Assay (FDA approved for NASAL specimens only), is one component of a comprehensive MRSA colonization surveillance program. It is not intended to diagnose MRSA infection nor to guide or monitor treatment for MRSA infections.         Blood Culture    Component Value Date/Time   SDES BLOOD RIGHT ARM 08/15/2015 1756   SPECREQUEST BOTTLES DRAWN AEROBIC AND ANAEROBIC 10ML 08/15/2015 1756   CULT NO GROWTH 5 DAYS 08/15/2015 1756   REPTSTATUS 08/20/2015 FINAL 08/15/2015 1756      Labs: Results for orders placed or performed during the hospital encounter of 01/12/16 (from the past 48 hour(s))  Glucose, capillary     Status: Abnormal   Collection Time: 01/17/16  1:03 PM  Result Value Ref Range   Glucose-Capillary 144 (H) 65 - 99 mg/dL  Glucose, capillary     Status: Abnormal   Collection Time: 01/17/16  4:25 PM  Result Value Ref Range   Glucose-Capillary 167 (H) 65 - 99 mg/dL  Glucose, capillary     Status: Abnormal   Collection Time: 01/17/16 10:31 PM  Result Value Ref Range   Glucose-Capillary 196 (H) 65 - 99 mg/dL  CBC     Status: Abnormal   Collection Time: 01/18/16  5:46 AM  Result Value Ref Range   WBC 5.6 4.0 - 10.5 K/uL   RBC 2.67 (L) 4.22 - 5.81 MIL/uL   Hemoglobin 6.8 (LL) 13.0 - 17.0 g/dL    Comment: REPEATED TO VERIFY CRITICAL RESULT CALLED TO, READ BACK BY AND VERIFIED WITH: LUKE CARMAN,RN AT 0729 01/18/16 BY K BARR    HCT 21.4 (L) 39.0 - 52.0 %   MCV  80.1 78.0 - 100.0 fL   MCH 25.5 (L) 26.0 - 34.0 pg   MCHC 31.8 30.0 - 36.0 g/dL   RDW 15.4 11.5 - 15.5 %   Platelets 182 150 - 400 K/uL  Comprehensive metabolic panel     Status: Abnormal   Collection Time: 01/18/16  5:46 AM  Result Value Ref Range   Sodium 143 135 - 145 mmol/L   Potassium 3.6 3.5 - 5.1 mmol/L   Chloride 115 (H) 101 - 111 mmol/L   CO2 20 (L) 22 - 32 mmol/L   Glucose, Bld 171 (H) 65 - 99 mg/dL   BUN 19 6 - 20 mg/dL   Creatinine, Ser 1.21 0.61 - 1.24 mg/dL   Calcium 8.5 (L) 8.9 - 10.3 mg/dL   Total Protein 5.9 (L) 6.5 - 8.1 g/dL   Albumin 2.7 (L) 3.5 - 5.0 g/dL   AST 14 (L) 15 - 41 U/L   ALT 11 (L) 17 - 63 U/L   Alkaline Phosphatase 33 (L) 38 - 126 U/L   Total Bilirubin 1.0 0.3 - 1.2 mg/dL   GFR calc non Af Amer 52  (L) >60 mL/min   GFR calc Af Amer >60 >60 mL/min    Comment: (NOTE) The eGFR has been calculated using the CKD EPI equation. This calculation has not been validated in all clinical situations. eGFR's persistently <60 mL/min signify possible Chronic Kidney Disease.    Anion gap 8 5 - 15  Glucose, capillary     Status: Abnormal   Collection Time: 01/18/16  7:53 AM  Result Value Ref Range   Glucose-Capillary 166 (H) 65 - 99 mg/dL  Type and screen [place]     Status: None   Collection Time: 01/18/16  8:14 AM  Result Value Ref Range   ABO/RH(D) AB POS    Antibody Screen NEG    Sample Expiration 01/21/2016    Unit Number [id number]    Blood Component Type RED CELLS,LR    Unit division 00    Status of Unit ISSUED,FINAL    Transfusion Status OK TO TRANSFUSE    Crossmatch Result Compatible    Unit Number [id number]    Blood Component Type RED CELLS,LR    Unit division 00    Status of Unit ISSUED,FINAL    Transfusion Status OK TO TRANSFUSE    Crossmatch Result Compatible   Prepare RBC     Status: None   Collection Time: 01/18/16  8:14 AM  Result Value Ref Range   Order Confirmation ORDER PROCESSED BY BLOOD BANK   Glucose, capillary     Status: Abnormal   Collection Time: 01/18/16 11:23 AM  Result Value Ref Range   Glucose-Capillary 262 (H) 65 - 99 mg/dL  Glucose, capillary     Status: Abnormal   Collection Time: 01/18/16  5:28 PM  Result Value Ref Range   Glucose-Capillary 184 (H) 65 - 99 mg/dL  Glucose, capillary     Status: Abnormal   Collection Time: 01/18/16  8:33 PM  Result Value Ref Range   Glucose-Capillary 236 (H) 65 - 99 mg/dL  Hemoglobin and hematocrit, blood     Status: Abnormal   Collection Time: 01/18/16 11:03 PM  Result Value Ref Range   Hemoglobin 8.2 (L) 13.0 - 17.0 g/dL   HCT 26.5 (L) 39.0 - 52.0 %  CBC     Status: Abnormal   Collection Time: 01/19/16  5:49 AM  Result Value Ref Range   WBC 6.4 4.0 -  10.5 K/uL   RBC 3.46 (L)  4.22 - 5.81 MIL/uL   Hemoglobin 8.7 (L) 13.0 - 17.0 g/dL   HCT 28.0 (L) 39.0 - 52.0 %   MCV 80.9 78.0 - 100.0 fL   MCH 25.1 (L) 26.0 - 34.0 pg   MCHC 31.1 30.0 - 36.0 g/dL   RDW 15.1 11.5 - 15.5 %   Platelets 164 150 - 400 K/uL  Comprehensive metabolic panel     Status: Abnormal   Collection Time: 01/19/16  5:49 AM  Result Value Ref Range   Sodium 142 135 - 145 mmol/L   Potassium 3.7 3.5 - 5.1 mmol/L   Chloride 115 (H) 101 - 111 mmol/L   CO2 21 (L) 22 - 32 mmol/L   Glucose, Bld 196 (H) 65 - 99 mg/dL   BUN 23 (H) 6 - 20 mg/dL   Creatinine, Ser 1.36 (H) 0.61 - 1.24 mg/dL   Calcium 8.6 (L) 8.9 - 10.3 mg/dL   Total Protein 5.6 (L) 6.5 - 8.1 g/dL   Albumin 2.9 (L) 3.5 - 5.0 g/dL   AST 15 15 - 41 U/L   ALT 11 (L) 17 - 63 U/L   Alkaline Phosphatase 34 (L) 38 - 126 U/L   Total Bilirubin 1.5 (H) 0.3 - 1.2 mg/dL   GFR calc non Af Amer 45 (L) >60 mL/min   GFR calc Af Amer 53 (L) >60 mL/min    Comment: (NOTE) The eGFR has been calculated using the CKD EPI equation. This calculation has not been validated in all clinical situations. eGFR's persistently <60 mL/min signify possible Chronic Kidney Disease.    Anion gap 6 5 - 15  Glucose, capillary     Status: Abnormal   Collection Time: 01/19/16  8:18 AM  Result Value Ref Range   Glucose-Capillary 198 (H) 65 - 99 mg/dL     Lipid Panel     Component Value Date/Time   CHOL 118 10/09/2011 0843   TRIG 125 10/09/2011 0843   TRIG 133 05/14/2006 0728   HDL 27* 10/09/2011 0843   CHOLHDL 4.4 10/09/2011 0843   CHOLHDL 5.3 CALC 05/14/2006 0728   VLDL 25 10/09/2011 0843   LDLCALC 66 10/09/2011 0843   LDLDIRECT 59.2 12/24/2007 1316     Lab Results  Component Value Date   HGBA1C 8.3* 01/12/2016   HGBA1C 7.5 08/22/2015   HGBA1C 8.2* 11/12/2013        HPI :  Lexus Stalvey is a 82 y.o. male with medical history significant for Afib, aortic valve replacement on Xarelto being admitted for acute blood loss anemia and hemoglobin  of 6.  7/14 he was awaiting EGD and had an acute episode of agitation and EGD was post-poned. GI declined to perform procedure until he is less agitated. Agitation improved and the patient had EGD on 7/18  HOSPITAL COURSE:   GI Bleeding with acute blood loss anemia, drop in hemoglobin to 6.8 on 7/19 - DDx includes upper source such as an ulcer or gastritis and lower sources such as AVM, diverticular disease -Continue PPI. intermittent confusion and agitation somewhat delayed GI workup .Family had an endoscopy on 7/18  Endoscopy did not show any ulcer, however hemoglobin dropped from 7.5-6.8 and patient was transfused with 2 more units of packed red blood cells on 7/19. Hemoglobin now 8.7 Discontinue anticoagulation permanently based on cardiology recommendations, Dr. Johnsie Cancel, lifelong PPI,  Discussed with daughter on the day of discharge  Acute on chronic diastolic heart failure, moderate pulmonary hypertension  Cardiology following, patient also has aVR,Gradients have been high since 2015 deteriorating tissue AVR but stable Lasix continue lisinopril to 20 mg a day  Agitation, improved / hx of vascular dementia  - Multifactorial including age, non English speaker and confusion, possible dementia? - Improved  off of restraints.  CT head negative , likely at baseline cognitively ,  As per the granddaughter DNI , but would like CPR if needed   Dyslipidemia - Continue home atorvastatin   Essential hypertension - SBP trending 130s -170s, uncontrolled - Currently on home tamsulosin and home lisinopril at half dose, renal function is improving.  Increase lisinopril to 10 mg - baseline CKD stage II , baseline creatinine around 1.2  Atrial fibrillation - Heart rate ranging 50s-110s - He is not on a beta blocker or CCB per home meds - Monitor, goal would be HR < 110 - Xarelto held in the setting of bleeding, cardiology recommends to stop anticoagulation   S/P aortic valve  replacement with bioprosthetic valve - Xarelto held in the setting of bleeding, likely okay to continue to hold given it is a bioprosthetic valve, cardiology recommends to discontinue all anticoagulation as he is no longer candidate for long-term anticoagulation 2-D echo results as above, no recommendations for aspirin per cardiology  Diabetes mellitus, type 2  - BS ranging 140s-160s - Home glipizide resumed - Continue SSI, gabapentin  Hemoglobin A1c 8.3  History of stroke - Good BP and sugar control while in house - Holding some home BP medications given acute bleeding.   Dysphagia - Previously noted that he had an MBS with aspiration - SLP today, Diet recommendations: Dysphagia 2 (fine chop);Nectar-thick liquid   Discharge Exam:  Blood pressure 193/84, pulse 69, temperature 98.1 F (36.7 C), temperature source Oral, resp. rate 16, height _0  (1.778 m), weight 86.592 kg (190 lb 14.4 oz), SpO2 98 %.  General exam: Appears calm and comfortable, no restraints HENT: NCAT, neck is supple  Respiratory system: Respiratory effort normal. No audible wheezing Cardiovascular system: S1 & S2 heard, RR, NR. Gastrointestinal system: Abdomen is nondistended, soft and nontender. +BS Skin: Seborrheic keratosis noted on face/head Psychiatry: insight is low at this time, mood is calmer today    Follow-up Information    Follow up with pcp. Schedule an appointment as soon as possible for a visit in 1 week.      SignedReyne Dumas 01/19/2016, 9:12 AM        Time spent >45 mins

## 2016-01-19 NOTE — Progress Notes (Signed)
OT Cancellation Note  Patient Details Name: Harold Jones MRN: [id number] DOB: 04/19/1928   Cancelled Treatment:    Noted plan is SNF- will defer OT eval to SNF   Cadi Rhinehart, Karin GoldenLorraine D 01/19/2016, 12:05 PM

## 2016-01-19 NOTE — Evaluation (Signed)
Physical Therapy Evaluation Patient Details Name: Harold Jones MRN: [id number] DOB: [birth year] Today's Date: 01/19/2016   History of Present Illness  Patient is an 82 yo male admitted 01/12/16 with acute blood loss anemia.  Patient became agitated during stay - improved.   Patient speaks [place].   PMH:  Afib, AVR, HOH, HTN, DM, CVA, cognitive deficits, CAD, neuropathic pain  Clinical Impression  Patient presents with problems listed below.  Will benefit from acute PT to maximize functional mobility prior to discharge.  Patient from ALF.  Currently with difficulty with mobility/gait, requiring assist.  Recommend SNF at d/c for continued therapy.    Follow Up Recommendations SNF;Supervision/Assistance - 24 hour    Equipment Recommendations  Rolling walker with 5" wheels (Unsure of equipment patient has)    Recommendations for Other Services       Precautions / Restrictions Precautions Precautions: Fall Restrictions Weight Bearing Restrictions: No      Mobility  Bed Mobility Overal bed mobility: Needs Assistance Bed Mobility: Supine to Sit;Sit to Supine     Supine to sit: Min guard Sit to supine: Min guard   General bed mobility comments: Assist for safety  Transfers Overall transfer level: Needs assistance Equipment used: 1 person hand held assist Transfers: Sit to/from Stand Sit to Stand: Min assist;Mod assist         General transfer comment: Assist to power up to stance.  Assist for balance once in stance due to posterior lean.  Ambulation/Gait Ambulation/Gait assistance: Mod assist Ambulation Distance (Feet):  (10 steps in place) Assistive device: 1 person hand held assist       General Gait Details: Patient very unsteady when stepping in place, losing balance posteriorly.  Trunk remains flexed during mobility.  Stairs            Wheelchair Mobility    Modified Rankin (Stroke Patients Only)       Balance Overall balance assessment: Needs  assistance         Standing balance support: Single extremity supported Standing balance-Leahy Scale: Poor Standing balance comment: UE support to maintain balance                             Pertinent Vitals/Pain Pain Assessment: No/denies pain Faces Pain Scale: No hurt    Home Living Family/patient expects to be discharged to:: Skilled nursing facility                 Additional Comments: Patient from ALF    Prior Function Level of Independence: Needs assistance         Comments: Patient from ALF - unsure of PLOF     Hand Dominance        Extremity/Trunk Assessment   Upper Extremity Assessment: Generalized weakness           Lower Extremity Assessment: Generalized weakness         Communication   Communication: Interpreter utilized;Prefers language other than [place] Garment/textile technologist # [phone])  Cognition Arousal/Alertness: Awake/alert Behavior During Therapy: WFL for tasks assessed/performed Overall Cognitive Status: Difficult to assess                      General Comments      Exercises        Assessment/Plan    PT Assessment Patient needs continued PT services  PT Diagnosis Difficulty walking;Generalized weakness   PT Problem List Decreased strength;Decreased activity tolerance;Decreased balance;Decreased mobility;Decreased knowledge  of use of DME  PT Treatment Interventions DME instruction;Gait training;Functional mobility training;Therapeutic activities;Therapeutic exercise;Balance training;Patient/family education   PT Goals (Current goals can be found in the Care Plan section) Acute Rehab PT Goals Patient Stated Goal: None stated PT Goal Formulation: With patient Time For Goal Achievement: 01/26/16 Potential to Achieve Goals: Good    Frequency Min 2X/week   Barriers to discharge        Co-evaluation               End of Session Equipment Utilized During Treatment: Gait belt Activity Tolerance:  Patient tolerated treatment well Patient left: in bed;with call bell/phone within reach;with bed alarm set           Time: 1140-1154 PT Time Calculation (min) (ACUTE ONLY): 14 min   Charges:   PT Evaluation $PT Eval Moderate Complexity: 1 Procedure     PT G CodesVena Austria:        Keshonda Monsour H 01/19/2016, 12:09 PM Durenda HurtSusan H. Renaldo Fiddleravis, PT, [facility] CenterMBA Acute Rehab Services Pager [phone]

## 2016-01-19 NOTE — Clinical Social Work Placement (Signed)
   CLINICAL SOCIAL WORK PLACEMENT  NOTE 01/19/16 - DISCHARGED TO [facility].   Date:  01/19/2016  Patient Details  Name: Harold Jones MRN: [id number] Date of Birth: 07/30/1927  Clinical Social Work is seeking post-discharge placement for this patient at the Skilled  Nursing Facility level of care (*CSW will initial, date and re-position this form in  chart as items are completed):  Yes   Patient/family provided with [facility] [facility] Clinical Social Work Department's list of facilities offering this level of care within the geographic area requested by the patient (or if unable, by the patient's family).  Yes   Patient/family informed of their freedom to choose among providers that offer the needed level of care, that participate in Medicare, Medicaid or managed care program needed by the patient, have an available bed and are willing to accept the patient.  No   Patient/family informed of [facility] ownership interest in [facility] and [facility], as well as of the fact that they are under no obligation to receive care at these facilities.  PASRR submitted to EDS on       PASRR number received on       Existing PASRR number confirmed on 01/19/16     FL2 transmitted to all facilities in geographic area requested by pt/family on 01/19/16     FL2 transmitted to all facilities within larger geographic area on       Patient informed that his/her managed care company has contracts with or will negotiate with certain facilities, including the following:        Yes   Patient/family informed of bed offers received.  [facility]     Physician recommends and patient chooses bed at      [facility] on 01/19/16.  Patient to be transferred to facility by ambulance     Patient family notified on 01/19/16 of transfer.  Name of family member notified:  Ammie FerrierLjerka [name] - granddaughter.      PHYSICIAN       Additional Comment:    _______________________________________________ Cristobal Goldmannrawford, Bannon Giammarco Bradley, LCSW 01/19/2016, 3:15 PM

## 2016-01-19 NOTE — Progress Notes (Signed)
MBSS complete. Full report located under chart review in imaging section.  Recommend: Dysphagia 2 diet, nectar thick liquids, meds whole in puree, full supervision: cue small bites/ sips, cue pt to swallow 2 times each bolus.  Thana AtesAmy Oleksiak, MA, CCC-SLP [phone]

## 2016-01-19 NOTE — Progress Notes (Signed)
Used interpreter via phone to do assessment on the patient because he speaks [place]. Called [phone] and helped by Melan ([phone]) interpreter. Patient asked also at the end of the call if he has any concerns/questions. He said he wants to go home. Told him that I will tell him if I have any information.

## 2016-01-19 NOTE — Progress Notes (Signed)
Patient for discharge to [facility]. Report given to Jodi-LPN using SBAR. All questions were answered.

## 2016-01-19 NOTE — [place] FL2 (Signed)
[place] MEDICAID FL2 LEVEL OF CARE SCREENING TOOL     IDENTIFICATION  Patient Name: Harold Jones Birthdate: 04/15/1928 Sex: male Admission Date (Current Location): 01/12/2016  [place]  [place] [id number] Facility and Address:  [facility] [place], [address], [place], [place] [address]      Provider Number: [id number]  Attending Physician Name and Address:  Richarda OverlieNayana Abrol, MD  Relative Name and Phone Number:  Talmadge CoventryLjerka [name] - granddaughter - [phone]    Current Level of Care: Hospital Recommended Level of Care: Skilled Nursing Facility Prior Approval Number:    Date Approved/Denied:   PASRR Number: [id number] (Eff. 10/07/15)  Discharge Plan: SNF    Current Diagnoses: Patient Active Problem List   Diagnosis Date Noted  . Altered mental state   . Confusion   . Acute GI bleeding   . GI bleed 01/12/2016  . GIB (gastrointestinal bleeding) 01/12/2016  . Symptomatic bradycardia 08/15/2015  . Intraventricular conduction delay 08/15/2015  . S/P aortic valve replacement with bioprosthetic valve 08/15/2015  . Atrial fibrillation with slow ventricular response (HCC) 08/15/2015  . Severe sepsis (HCC) 05/30/2015  . Acute respiratory failure (HCC) 05/30/2015  . History of stroke 03/15/2015  . Benign hypertensive heart disease without heart failure 03/08/2015  . 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 BPH without obstruction/lower urinary tract symptoms 03/08/2015  . Post herpetic neuralgia 03/08/2015  . CAP (community acquired pneumonia) 03/01/2015  . Syncope and collapse 11/12/2013  . Nasal fracture 11/12/2013  . Encephalopathy acute 06/23/2013  . Diabetes mellitus, type 2 (HCC) 06/23/2013  . Hypoglycemia 06/23/2013  . Supratherapeutic INR 06/23/2013  . Zoster 10/12/2011  . Altered mental status 10/08/2011  . GLAUCOMA 09/08/2010  . KNEE PAIN, LEFT 11/22/2009  . COGNITIVE DEFICITS DUE  CEREBROVASCULAR DISEASE 09/09/2009  . CAROTID BRUIT, RIGHT 01/18/2009  . GASTROINTESTINAL HEMORRHAGE 09/26/2007  . OSTEOARTHRITIS 03/18/2007  . Dyslipidemia 02/27/2007  . CORONARY ARTERY DISEASE 02/27/2007  . Essential hypertension 02/25/2007  . Cerebral artery occlusion with cerebral infarction (HCC) 02/28/2004  . ASCENDING AORTIC ANEURYSM 02/28/2004  . H/O aortic valve replacement 02/28/2004  . FIBRILLATION, ATRIAL 08/31/2002    Orientation RESPIRATION BLADDER Height & Weight     Self, Place  Normal Continent, Incontinent Weight: 190 lb 14.4 oz (86.592 kg) Height:  5\' 10"  (177.8 cm)  BEHAVIORAL SYMPTOMS/MOOD NEUROLOGICAL BOWEL NUTRITION STATUS      Continent Diet (DYS 2)  AMBULATORY STATUS COMMUNICATION OF NEEDS Skin   Extensive Assist Verbally Normal                       Personal Care Assistance Level of Assistance  Bathing, Feeding, Dressing Bathing Assistance: Maximum assistance Feeding assistance: Independent Dressing Assistance: Maximum assistance     Functional Limitations Info  Sight, Hearing, Speech Sight Info: Adequate Hearing Info: Impaired Speech Info: Adequate (Patient does not speak English)    SPECIAL CARE FACTORS FREQUENCY  PT (By licensed PT)     PT Frequency: Evaluated 01/19/16              Contractures Contractures Info: Not present    Additional Factors Info  Code Status, Allergies Code Status Info: Full Allergies Info: No known allergies           Current Medications (01/19/2016):  This is the current hospital active medication list Current Facility-Administered Medications  Medication Dose Route Frequency Provider Last Rate Last Dose  . 0.9 %  sodium chloride  infusion   Intravenous Once Mir Vergie Living, MD      . atorvastatin (LIPITOR) tablet 10 mg  10 mg Oral Daily Mir Vergie Living, MD   10 mg at 01/19/16 1128  . docusate sodium (COLACE) capsule 100 mg  100 mg Oral BID Mir Vergie Living, MD   100 mg at  01/19/16 1128  . gabapentin (NEURONTIN) capsule 300 mg  300 mg Oral BID Mir Vergie Living, MD   300 mg at 01/19/16 1128  . insulin aspart (novoLOG) injection 0-15 Units  0-15 Units Subcutaneous TID AC & HS Richarda Overlie, MD   3 Units at 01/19/16 0825  . lisinopril (PRINIVIL,ZESTRIL) tablet 20 mg  20 mg Oral Daily Richarda Overlie, MD   20 mg at 01/19/16 1128  . LORazepam (ATIVAN) tablet 1 mg  1 mg Oral Q4H PRN Mir Vergie Living, MD   1 mg at 01/16/16 0203   Or  . LORazepam (ATIVAN) injection 1 mg  1 mg Intramuscular Q4H PRN Mir Vergie Living, MD   1 mg at 01/13/16 1159  . morphine 2 MG/ML injection 1 mg  1 mg Intravenous Q4H PRN Mir Vergie Living, MD   1 mg at 01/17/16 0444  . ondansetron (ZOFRAN) tablet 4 mg  4 mg Oral Q6H PRN Mir Vergie Living, MD       Or  . ondansetron [facility]) injection 4 mg  4 mg Intravenous Q6H PRN Mir Vergie Living, MD      . pantoprazole (PROTONIX) EC tablet 40 mg  40 mg Oral BID AC Inez Catalina, MD   40 mg at 01/19/16 0819  . polyethylene glycol (MIRALAX / GLYCOLAX) packet 17 g  17 g Oral Daily Mir Vergie Living, MD   17 g at 01/19/16 1130  . RESOURCE THICKENUP CLEAR   Oral PRN Inez Catalina, MD      . tamsulosin [facility]) capsule 0.4 mg  0.4 mg Oral QPC supper Mir Vergie Living, MD   0.4 mg at 01/18/16 1705     Discharge Medications: Please see discharge summary for a list of discharge medications.  Relevant Imaging Results:  Relevant Lab Results:   Additional Information ss#[id number].  Cristobal Goldmann, LCSW

## 2016-01-19 NOTE — Clinical Social Work Note (Signed)
Clinical Social Work Assessment  Patient Details  Name: Harold Jones MRN: [id number] Date of Birth: 02/26/1928  Date of referral:  01/18/16               Reason for consult:  Facility Placement                Permission sought to share information with:  Other (No, patient oriented to person and place only) Permission granted to share information::  No  Name::     Harold Jones  Agency::     Relationship::  Granddaughter  Contact Information:  [phone]  Housing/Transportation Living arrangements for the past 2 months:  Single Family Home Source of Information:  Other (Comment Required) (Granddaughter [name]) Patient Interpreter Needed:  Other (Comment Required) Ottie Glazier(Bosnian) Criminal Activity/Legal Involvement Pertinent to Current Situation/Hospitalization:  No - Comment as needed Significant Relationships:  Other Family Members (Granddaughter) Lives with:  Facility Resident ([facility]) Do you feel safe going back to the place where you live?  No (Granddaughter agrees that patient could benefit from ST rehab before returning to ALF) Need for family participation in patient care:  Yes (Comment)  Care giving concerns:  Granddaughter agreeable to rehab if needed to help with strengthening and safety once back at ALF.   Social Worker assessment / plan:  CSW talked with patient's granddaughter as patient is oriented only to person and place. Talked with [name] regarding discharge planning and recommendation of ST rehab.  She is in agreement and informed CSW that her preference is [facility] as patient has been there before.   Employment status:  Retired Health and safety inspectornsurance information:  Armed forces operational officerMedicare, Medicaid In [employer] PT Recommendations:  Skilled Nursing Facility Information / Referral to community resources:  Skilled Holiday representativeursing Facility (Granddaughter provided [place] [facility])  Patient/Family's Response to care:  Granddaughter did not express any concerns  regarding patient's care during hospitalization.  Patient/Family's Understanding of and Emotional Response to Diagnosis, Current Treatment, and Prognosis:  Not discussed.  Emotional Assessment Appearance:  Appears stated age Attitude/Demeanor/Rapport:  Unable to Assess (Did not talk with patient as oriented only to Person and Place) Affect (typically observed):  Unable to Assess Orientation:  Oriented to Self, Oriented to Place Alcohol / Substance use:  Never Used Psych involvement (Current and /or in the community):  No (Comment)  Discharge Needs  Concerns to be addressed:  Discharge Planning Concerns Readmission within the last 30 days:  No Current discharge risk:  None Barriers to Discharge:  No Barriers Identified   Cristobal GoldmannCrawford, Brittain Smithey Bradley, LCSW 01/19/2016, 3:08 PM

## 2016-02-27 ENCOUNTER — Emergency Department (HOSPITAL_COMMUNITY): Payer: Medicare Other

## 2016-02-27 ENCOUNTER — Observation Stay (HOSPITAL_COMMUNITY): Payer: Medicare Other

## 2016-02-27 ENCOUNTER — Observation Stay (HOSPITAL_COMMUNITY)
Admission: EM | Admit: 2016-02-27 | Discharge: 2016-02-29 | Disposition: A | Discharge status: Home Health | Payer: Medicare Other | Attending: Internal Medicine | Admitting: Internal Medicine

## 2016-02-27 ENCOUNTER — Encounter (HOSPITAL_COMMUNITY): Payer: Self-pay | Admitting: Emergency Medicine

## 2016-02-27 DIAGNOSIS — R531 Weakness: Secondary | ICD-10-CM | POA: Insufficient documentation

## 2016-02-27 DIAGNOSIS — I130 Hypertensive heart and chronic kidney disease with heart failure and stage 1 through stage 4 chronic kidney disease, or unspecified chronic kidney disease: Secondary | ICD-10-CM | POA: Diagnosis not present

## 2016-02-27 DIAGNOSIS — Z66 Do not resuscitate: Secondary | ICD-10-CM | POA: Insufficient documentation

## 2016-02-27 DIAGNOSIS — J9600 Acute respiratory failure, unspecified whether with hypoxia or hypercapnia: Secondary | ICD-10-CM | POA: Diagnosis present

## 2016-02-27 DIAGNOSIS — I2510 Atherosclerotic heart disease of native coronary artery without angina pectoris: Secondary | ICD-10-CM | POA: Diagnosis not present

## 2016-02-27 DIAGNOSIS — Z95810 Presence of automatic (implantable) cardiac defibrillator: Secondary | ICD-10-CM | POA: Insufficient documentation

## 2016-02-27 DIAGNOSIS — N400 Enlarged prostate without lower urinary tract symptoms: Secondary | ICD-10-CM | POA: Insufficient documentation

## 2016-02-27 DIAGNOSIS — N183 Chronic kidney disease, stage 3 (moderate): Secondary | ICD-10-CM | POA: Insufficient documentation

## 2016-02-27 DIAGNOSIS — R0789 Other chest pain: Principal | ICD-10-CM | POA: Insufficient documentation

## 2016-02-27 DIAGNOSIS — Z953 Presence of xenogenic heart valve: Secondary | ICD-10-CM | POA: Diagnosis not present

## 2016-02-27 DIAGNOSIS — E1100 Type 2 diabetes mellitus with hyperosmolarity without nonketotic hyperglycemic-hyperosmolar coma (NKHHC): Secondary | ICD-10-CM | POA: Diagnosis not present

## 2016-02-27 DIAGNOSIS — I69319 Unspecified symptoms and signs involving cognitive functions following cerebral infarction: Secondary | ICD-10-CM | POA: Diagnosis not present

## 2016-02-27 DIAGNOSIS — H409 Unspecified glaucoma: Secondary | ICD-10-CM | POA: Insufficient documentation

## 2016-02-27 DIAGNOSIS — J189 Pneumonia, unspecified organism: Secondary | ICD-10-CM

## 2016-02-27 DIAGNOSIS — Z950 Presence of cardiac pacemaker: Secondary | ICD-10-CM | POA: Insufficient documentation

## 2016-02-27 DIAGNOSIS — I5033 Acute on chronic diastolic (congestive) heart failure: Secondary | ICD-10-CM | POA: Diagnosis not present

## 2016-02-27 DIAGNOSIS — I4891 Unspecified atrial fibrillation: Secondary | ICD-10-CM | POA: Diagnosis present

## 2016-02-27 DIAGNOSIS — E1122 Type 2 diabetes mellitus with diabetic chronic kidney disease: Secondary | ICD-10-CM | POA: Insufficient documentation

## 2016-02-27 DIAGNOSIS — R262 Difficulty in walking, not elsewhere classified: Secondary | ICD-10-CM | POA: Diagnosis not present

## 2016-02-27 DIAGNOSIS — F0390 Unspecified dementia without behavioral disturbance: Secondary | ICD-10-CM | POA: Diagnosis not present

## 2016-02-27 DIAGNOSIS — E785 Hyperlipidemia, unspecified: Secondary | ICD-10-CM | POA: Diagnosis not present

## 2016-02-27 DIAGNOSIS — Z8673 Personal history of transient ischemic attack (TIA), and cerebral infarction without residual deficits: Secondary | ICD-10-CM | POA: Diagnosis not present

## 2016-02-27 DIAGNOSIS — Z952 Presence of prosthetic heart valve: Secondary | ICD-10-CM | POA: Insufficient documentation

## 2016-02-27 DIAGNOSIS — I5032 Chronic diastolic (congestive) heart failure: Secondary | ICD-10-CM | POA: Diagnosis not present

## 2016-02-27 DIAGNOSIS — I69919 Unspecified symptoms and signs involving cognitive functions following unspecified cerebrovascular disease: Secondary | ICD-10-CM

## 2016-02-27 DIAGNOSIS — R109 Unspecified abdominal pain: Secondary | ICD-10-CM | POA: Diagnosis present

## 2016-02-27 DIAGNOSIS — J9601 Acute respiratory failure with hypoxia: Secondary | ICD-10-CM | POA: Diagnosis not present

## 2016-02-27 DIAGNOSIS — I482 Chronic atrial fibrillation: Secondary | ICD-10-CM | POA: Diagnosis not present

## 2016-02-27 DIAGNOSIS — R1310 Dysphagia, unspecified: Secondary | ICD-10-CM | POA: Insufficient documentation

## 2016-02-27 DIAGNOSIS — N179 Acute kidney failure, unspecified: Secondary | ICD-10-CM | POA: Insufficient documentation

## 2016-02-27 DIAGNOSIS — I509 Heart failure, unspecified: Secondary | ICD-10-CM

## 2016-02-27 DIAGNOSIS — E119 Type 2 diabetes mellitus without complications: Secondary | ICD-10-CM

## 2016-02-27 DIAGNOSIS — Z7982 Long term (current) use of aspirin: Secondary | ICD-10-CM | POA: Diagnosis not present

## 2016-02-27 HISTORY — DX: Unspecified dementia, unspecified severity, without behavioral disturbance, psychotic disturbance, mood disturbance, and anxiety: F03.90

## 2016-02-27 LAB — COMPREHENSIVE METABOLIC PANEL
ALK PHOS: 38 U/L (ref 38–126)
ALT: 12 U/L — AB (ref 17–63)
ANION GAP: 10 (ref 5–15)
AST: 16 U/L (ref 15–41)
Albumin: 3.7 g/dL (ref 3.5–5.0)
BUN: 30 mg/dL — ABNORMAL HIGH (ref 6–20)
CALCIUM: 9.2 mg/dL (ref 8.9–10.3)
CHLORIDE: 108 mmol/L (ref 101–111)
CO2: 20 mmol/L — AB (ref 22–32)
CREATININE: 1.88 mg/dL — AB (ref 0.61–1.24)
GFR, EST AFRICAN AMERICAN: 36 mL/min — AB (ref >60)
GFR, EST NON AFRICAN AMERICAN: 31 mL/min — AB (ref >60)
Glucose, Bld: 188 mg/dL — ABNORMAL HIGH (ref 65–99)
Potassium: 4.5 mmol/L (ref 3.5–5.1)
SODIUM: 138 mmol/L (ref 135–145)
Total Bilirubin: 0.8 mg/dL (ref 0.3–1.2)
Total Protein: 7.2 g/dL (ref 6.5–8.1)

## 2016-02-27 LAB — CBC WITH DIFFERENTIAL/PLATELET
BASOS ABS: 0 10*3/uL (ref 0.0–0.1)
BASOS PCT: 0 %
EOS ABS: 0.2 10*3/uL (ref 0.0–0.7)
Eosinophils Relative: 2 %
HCT: 32.8 % — ABNORMAL LOW (ref 39.0–52.0)
Hemoglobin: 10 g/dL — ABNORMAL LOW (ref 13.0–17.0)
Lymphocytes Relative: 12 %
Lymphs Abs: 1.4 10*3/uL (ref 0.7–4.0)
MCH: 24.8 pg — ABNORMAL LOW (ref 26.0–34.0)
MCHC: 30.5 g/dL (ref 30.0–36.0)
MCV: 81.2 fL (ref 78.0–100.0)
MONO ABS: 0.6 10*3/uL (ref 0.1–1.0)
MONOS PCT: 5 %
Neutro Abs: 9.2 10*3/uL — ABNORMAL HIGH (ref 1.7–7.7)
Neutrophils Relative %: 81 %
PLATELETS: 175 10*3/uL (ref 150–400)
RBC: 4.04 MIL/uL — ABNORMAL LOW (ref 4.22–5.81)
RDW: 16.7 % — AB (ref 11.5–15.5)
WBC: 11.4 10*3/uL — ABNORMAL HIGH (ref 4.0–10.5)

## 2016-02-27 LAB — URINALYSIS, ROUTINE W REFLEX MICROSCOPIC
Bilirubin Urine: NEGATIVE
GLUCOSE, UA: NEGATIVE mg/dL
Ketones, ur: NEGATIVE mg/dL
Leukocytes, UA: NEGATIVE
NITRITE: NEGATIVE
Protein, ur: 30 mg/dL — AB
SPECIFIC GRAVITY, URINE: 1.018 (ref 1.005–1.030)
pH: 5.5 (ref 5.0–8.0)

## 2016-02-27 LAB — URINE MICROSCOPIC-ADD ON
BACTERIA UA: NONE SEEN
WBC, UA: NONE SEEN WBC/hpf (ref 0–5)

## 2016-02-27 LAB — TROPONIN I: Troponin I: 0.28 ng/mL (ref <0.03)

## 2016-02-27 LAB — GLUCOSE, CAPILLARY: Glucose-Capillary: 155 mg/dL — ABNORMAL HIGH (ref 65–99)

## 2016-02-27 LAB — LIPASE, BLOOD: LIPASE: 18 U/L (ref 11–51)

## 2016-02-27 LAB — TSH: TSH: 0.227 u[IU]/mL — ABNORMAL LOW (ref 0.350–4.500)

## 2016-02-27 LAB — SODIUM, URINE, RANDOM: Sodium, Ur: 106 mmol/L

## 2016-02-27 LAB — BRAIN NATRIURETIC PEPTIDE: B NATRIURETIC PEPTIDE 5: 203.5 pg/mL — AB (ref 0.0–100.0)

## 2016-02-27 LAB — I-STAT TROPONIN, ED: TROPONIN I, POC: 0.02 ng/mL (ref 0.00–0.08)

## 2016-02-27 LAB — CBG MONITORING, ED: GLUCOSE-CAPILLARY: 171 mg/dL — AB (ref 65–99)

## 2016-02-27 LAB — CREATININE, URINE, RANDOM: CREATININE, URINE: 93.83 mg/dL

## 2016-02-27 LAB — PROCALCITONIN: Procalcitonin: 0.58 ng/mL

## 2016-02-27 LAB — LACTIC ACID, PLASMA: Lactic Acid, Venous: 1.1 mmol/L (ref 0.5–1.9)

## 2016-02-27 MED ORDER — VANCOMYCIN HCL 10 G IV SOLR
1250.0000 mg | Freq: Once | INTRAVENOUS | Status: AC
  Administered 2016-02-28: 1250 mg via INTRAVENOUS
  Filled 2016-02-27 (×2): qty 1250

## 2016-02-27 MED ORDER — PANTOPRAZOLE SODIUM 40 MG PO TBEC
40.0000 mg | DELAYED_RELEASE_TABLET | Freq: Every day | ORAL | Status: DC
  Administered 2016-02-28 – 2016-02-29 (×2): 40 mg via ORAL
  Filled 2016-02-27 (×2): qty 1

## 2016-02-27 MED ORDER — PIPERACILLIN-TAZOBACTAM 3.375 G IVPB
3.3750 g | Freq: Three times a day (TID) | INTRAVENOUS | Status: DC
  Administered 2016-02-28 (×2): 3.375 g via INTRAVENOUS
  Filled 2016-02-27 (×6): qty 50

## 2016-02-27 MED ORDER — SODIUM CHLORIDE 0.9% FLUSH
3.0000 mL | Freq: Two times a day (BID) | INTRAVENOUS | Status: DC
  Administered 2016-02-27 – 2016-02-29 (×4): 3 mL via INTRAVENOUS

## 2016-02-27 MED ORDER — ATORVASTATIN CALCIUM 10 MG PO TABS
10.0000 mg | ORAL_TABLET | Freq: Every day | ORAL | Status: DC
  Administered 2016-02-28 – 2016-02-29 (×2): 10 mg via ORAL
  Filled 2016-02-27 (×2): qty 1

## 2016-02-27 MED ORDER — ENOXAPARIN SODIUM 30 MG/0.3ML ~~LOC~~ SOLN
30.0000 mg | SUBCUTANEOUS | Status: DC
  Administered 2016-02-27 – 2016-02-28 (×2): 30 mg via SUBCUTANEOUS
  Filled 2016-02-27 (×2): qty 0.3

## 2016-02-27 MED ORDER — ACETAMINOPHEN 325 MG PO TABS: 650.0000 mg | ORAL_TABLET | ORAL | Status: DC | PRN

## 2016-02-27 MED ORDER — ASPIRIN EC 81 MG PO TBEC
81.0000 mg | DELAYED_RELEASE_TABLET | Freq: Every day | ORAL | Status: DC
  Administered 2016-02-27 – 2016-02-29 (×3): 81 mg via ORAL
  Filled 2016-02-27 (×3): qty 1

## 2016-02-27 MED ORDER — ONDANSETRON HCL 4 MG/2ML IJ SOLN: 4.0000 mg | Freq: Four times a day (QID) | INTRAMUSCULAR | Status: DC | PRN

## 2016-02-27 MED ORDER — FUROSEMIDE 10 MG/ML IJ SOLN
60.0000 mg | Freq: Once | INTRAMUSCULAR | Status: AC
  Administered 2016-02-27: 60 mg via INTRAVENOUS
  Filled 2016-02-27: qty 6

## 2016-02-27 MED ORDER — SODIUM CHLORIDE 0.9% FLUSH
3.0000 mL | INTRAVENOUS | Status: DC | PRN
Start: 2016-02-27 — End: 2016-02-29

## 2016-02-27 MED ORDER — DOCUSATE SODIUM 100 MG PO CAPS
100.0000 mg | ORAL_CAPSULE | Freq: Two times a day (BID) | ORAL | Status: DC
  Administered 2016-02-27 – 2016-02-29 (×4): 100 mg via ORAL
  Filled 2016-02-27 (×4): qty 1

## 2016-02-27 MED ORDER — INSULIN ASPART 100 UNIT/ML ~~LOC~~ SOLN
0.0000 [IU] | Freq: Three times a day (TID) | SUBCUTANEOUS | Status: DC
  Administered 2016-02-28: 2 [IU] via SUBCUTANEOUS
  Administered 2016-02-28: 1 [IU] via SUBCUTANEOUS
  Administered 2016-02-28: 3 [IU] via SUBCUTANEOUS
  Administered 2016-02-29 (×2): 2 [IU] via SUBCUTANEOUS

## 2016-02-27 MED ORDER — TAMSULOSIN HCL 0.4 MG PO CAPS
0.4000 mg | ORAL_CAPSULE | Freq: Every morning | ORAL | Status: DC
  Administered 2016-02-28 – 2016-02-29 (×2): 0.4 mg via ORAL
  Filled 2016-02-27 (×2): qty 1

## 2016-02-27 MED ORDER — SODIUM CHLORIDE 0.9 % IV SOLN: 250.0000 mL | INTRAVENOUS | Status: DC | PRN

## 2016-02-27 MED ORDER — GABAPENTIN 300 MG PO CAPS
300.0000 mg | ORAL_CAPSULE | Freq: Two times a day (BID) | ORAL | Status: DC
  Administered 2016-02-27 – 2016-02-29 (×4): 300 mg via ORAL
  Filled 2016-02-27 (×4): qty 1

## 2016-02-27 MED ORDER — FLUTICASONE PROPIONATE 50 MCG/ACT NA SUSP
2.0000 | Freq: Every day | NASAL | Status: DC
  Administered 2016-02-28 – 2016-02-29 (×2): 2 via NASAL
  Filled 2016-02-27 (×2): qty 16

## 2016-02-27 MED ORDER — POLYETHYLENE GLYCOL 3350 17 G PO PACK
17.0000 g | PACK | Freq: Every day | ORAL | Status: DC
  Administered 2016-02-28 – 2016-02-29 (×2): 17 g via ORAL
  Filled 2016-02-27 (×2): qty 1

## 2016-02-27 MED ORDER — ONDANSETRON HCL 4 MG/2ML IJ SOLN
4.0000 mg | Freq: Once | INTRAMUSCULAR | Status: AC
  Administered 2016-02-27: 4 mg via INTRAVENOUS
  Filled 2016-02-27: qty 2

## 2016-02-27 NOTE — ED Provider Notes (Signed)
[facility]-EMERGENCY DEPT Provider Note   CSN: [id number] Arrival date & time: 02/27/16  1251     History   Chief Complaint Chief Complaint  Patient presents with  . Emesis  . Altered Mental Status  . Chest Pain    HPI Harold Jones is a 82 y.o. male.  Patient is a 83 year old male with past medical history of hypertension, diabetes, A. Fib with pacemaker, CVA, CAD, hyperlipidemia, CHF and dementia who [facility] with complaint of chest pain, onset prior to arrival. Pt speaks [place] and an interpreter was used during interview/exam. I spoke with nurse at nursing facility who reports that the patient walked up to her and began rubbing his chest. She notes immediately after he ran back into his room and started vomiting. Nurse reports that patient appeared mildly short of breath during this episode and appeared slightly clammy. She notes she gave the patient for aspirin. Nurse also reports that the patient has had a decreased appetite over the past week. Denies fever, altered mental status, cough, wheezing, abdominal pain, diarrhea, urinary symptoms, numbness, tingling, weakness. Nurse denies any recent falls.   Level V caveat dementia      Past Medical History:  Diagnosis Date  . Aortic valve disorder   . BPH without obstruction/lower urinary tract symptoms   . Chest wall pain following surgery   . COGNITIVE DEFICITS DUE CEREBROVASCULAR DISEASE 09/09/2009   Qualifier: Diagnosis of  By: Delrae Alfred MD, Lanora Manis    . CORONARY ARTERY DISEASE 02/27/2007   Qualifier: Diagnosis of  By: Barbaraann Barthel MD, [place]    . CVA 02/28/2004   Annotation: embolic Qualifier: Diagnosis of  By: Barbaraann Barthel MD, [place]    . Diabetes mellitus   . DYSLIPIDEMIA 02/27/2007   Qualifier: Diagnosis of  By: Barbaraann Barthel MD, [place]    . FIBRILLATION, ATRIAL 08/31/2002   Qualifier: Diagnosis of  By: Barbaraann Barthel MD, [place]    . Grief   . Hypertension   . Neuropathic pain   . Osteoarthritis  of left knee   . Physical deconditioning   . Symptomatic bradycardia 08/16/15   [employer] PPM, Dr. Ladona Ridgel    Patient Active Problem List   Diagnosis Date Noted  . CHF exacerbation (HCC) 02/27/2016  . Altered mental state   . Confusion   . Acute GI bleeding   . GI bleed 01/12/2016  . GIB (gastrointestinal bleeding) 01/12/2016  . Symptomatic bradycardia 08/15/2015  . Intraventricular conduction delay 08/15/2015  . S/P aortic valve replacement with bioprosthetic valve 08/15/2015  . Atrial fibrillation with slow ventricular response (HCC) 08/15/2015  . Severe sepsis (HCC) 05/30/2015  . Acute respiratory failure (HCC) 05/30/2015  . History of stroke 03/15/2015  . Benign hypertensive heart disease without heart failure 03/08/2015  . 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 BPH without obstruction/lower urinary tract symptoms 03/08/2015  . Post herpetic neuralgia 03/08/2015  . CAP (community acquired pneumonia) 03/01/2015  . Syncope and collapse 11/12/2013  . Nasal fracture 11/12/2013  . Encephalopathy acute 06/23/2013  . Diabetes mellitus, type 2 (HCC) 06/23/2013  . Hypoglycemia 06/23/2013  . Supratherapeutic INR 06/23/2013  . Zoster 10/12/2011  . Altered mental status 10/08/2011  . GLAUCOMA 09/08/2010  . KNEE PAIN, LEFT 11/22/2009  . COGNITIVE DEFICITS DUE CEREBROVASCULAR DISEASE 09/09/2009  . CAROTID BRUIT, RIGHT 01/18/2009  . GASTROINTESTINAL HEMORRHAGE 09/26/2007  . OSTEOARTHRITIS 03/18/2007  . Dyslipidemia 02/27/2007  . CORONARY ARTERY DISEASE 02/27/2007  . Essential hypertension 02/25/2007  .  Cerebral artery occlusion with cerebral infarction (HCC) 02/28/2004  . ASCENDING AORTIC ANEURYSM 02/28/2004  . H/O aortic valve replacement 02/28/2004  . FIBRILLATION, ATRIAL 08/31/2002    Past Surgical History:  Procedure Laterality Date  . CARDIAC SURGERY    . EP IMPLANTABLE DEVICE N/A 08/16/2015   [facility] PPM, Dr.  Ladona Ridgel  . ESOPHAGOGASTRODUODENOSCOPY (EGD) WITH PROPOFOL N/A 01/17/2016   Procedure: ESOPHAGOGASTRODUODENOSCOPY (EGD) WITH PROPOFOL;  Surgeon: Bernette Redbird, MD;  Location: [facility] ENDOSCOPY;  Service: Endoscopy;  Laterality: N/A;       Home Medications    Prior to Admission medications   Medication Sig Start Date End Date Taking? Authorizing Provider  acetaminophen (MAPAP) 325 MG tablet Take 650 mg by mouth 2 (two) times daily. NOT TO EXCEED 3,000 MG/DAY FROM ALL (COMBINED) SOURCES   Yes Historical Provider, MD  aspirin 81 MG chewable tablet Chew 324 mg by mouth once. IF NEEDED FOR PAIN OR CHEST PAIN   Yes Historical Provider, MD  atorvastatin (LIPITOR) 10 MG tablet Take 10 mg by mouth daily.   Yes Historical Provider, MD  docusate sodium (COLACE) 100 MG capsule Take 100 mg by mouth 2 (two) times daily.   Yes Historical Provider, MD  fluticasone (FLONASE ALLERGY RELIEF) 50 MCG/ACT nasal spray Place 2 sprays into both nostrils daily. For 14 days (Start date: 02/22/16)   Yes Historical Provider, MD  furosemide (LASIX) 20 MG tablet Take 20 mg by mouth daily. Reported on 08/17/2015   Yes Historical Provider, MD  gabapentin (NEURONTIN) 300 MG capsule Take 300 mg by mouth 2 (two) times daily.   Yes Historical Provider, MD  glipiZIDE (GLUCOTROL) 5 MG tablet Take 5 mg by mouth daily before breakfast.  11/14/13  Yes Esperanza Sheets, MD  lisinopril (PRINIVIL,ZESTRIL) 20 MG tablet Take 20 mg by mouth daily.   Yes Historical Provider, MD  pantoprazole (PROTONIX) 40 MG tablet Take 1 tablet (40 mg total) by mouth daily. 01/19/16  Yes Richarda Overlie, MD  polyethylene glycol (MIRALAX / GLYCOLAX) packet Take 17 g by mouth daily. Mix with 8 ounces of liquid/Hold for loose stools   Yes Historical Provider, MD  tamsulosin (FLOMAX) 0.4 MG CAPS capsule Take 0.4 mg by mouth every morning.  02/12/15  Yes Historical Provider, MD  traMADol (ULTRAM) 50 MG tablet Take 50 mg by mouth every 6 (six) hours as needed (FOR KNEE PAIN).    Yes Historical Provider, MD    Family History No family history on file.  Social History Social History  Substance Use Topics  . Smoking status: Never Smoker  . Smokeless tobacco: Never Used  . Alcohol use No     Allergies   Review of patient's allergies indicates no known allergies.   Review of Systems Review of Systems  Unable to perform ROS: Dementia  Respiratory: Positive for shortness of breath.   Cardiovascular: Positive for chest pain.  Gastrointestinal: Positive for nausea and vomiting.     Physical Exam Updated Vital Signs BP 143/57   Pulse 60   Temp 97.7 F (36.5 C)   Resp 19   Ht 6' (1.829 m)   Wt 86.2 kg   SpO2 97%   BMI 25.77 kg/m   Physical Exam  Constitutional: He appears well-developed and well-nourished.  Elderly appearing male  HENT:  Head: Normocephalic and atraumatic.  Mouth/Throat: Oropharynx is clear and moist. No oropharyngeal exudate.  Eyes: Conjunctivae and EOM are normal. Right eye exhibits no discharge. Left eye exhibits no discharge. No scleral icterus.  Neck: Normal range of motion. Neck supple.  Cardiovascular: Normal rate, regular rhythm, normal heart sounds and intact distal pulses.   Pulmonary/Chest: Effort normal. No respiratory distress. He has no wheezes. He has rales (mild rales noted in bilateral lower lobes).  Abdominal: Soft. Bowel sounds are normal. He exhibits no distension and no mass. There is no tenderness. There is no rebound and no guarding. No hernia.  Musculoskeletal: He exhibits no edema.  Neurological: He is alert. He has normal strength. No sensory deficit.  Skin: Skin is warm and dry.  Nursing note and vitals reviewed.    ED Treatments / Results  Labs (all labs ordered are listed, but only abnormal results are displayed) Labs Reviewed  COMPREHENSIVE METABOLIC PANEL - Abnormal; Notable for the following:       Result Value   CO2 20 (*)    Glucose, Bld 188 (*)    BUN 30 (*)    Creatinine, Ser 1.88  (*)    ALT 12 (*)    GFR calc non Af Amer 31 (*)    GFR calc Af Amer 36 (*)    All other components within normal limits  CBC WITH DIFFERENTIAL/PLATELET - Abnormal; Notable for the following:    WBC 11.4 (*)    RBC 4.04 (*)    Hemoglobin 10.0 (*)    HCT 32.8 (*)    MCH 24.8 (*)    RDW 16.7 (*)    Neutro Abs 9.2 (*)    All other components within normal limits  BRAIN NATRIURETIC PEPTIDE - Abnormal; Notable for the following:    B Natriuretic Peptide 203.5 (*)    All other components within normal limits  CBG MONITORING, ED - Abnormal; Notable for the following:    Glucose-Capillary 171 (*)    All other components within normal limits  LIPASE, BLOOD  URINALYSIS, ROUTINE W REFLEX MICROSCOPIC (NOT AT [facility])  I-STAT TROPOININ, ED    EKG  EKG Interpretation  Date/Time:  Monday February 27 2016 12:54:18 EDT Ventricular Rate:  73 PR Interval:    QRS Duration: 138 QT Interval:  418 QTC Calculation: 461 R Axis:   -45 Text Interpretation:  Sinus or ectopic atrial rhythm Atrial premature complex Borderline prolonged PR interval Left bundle branch block No STEMI. Compared to prior tracings.  Confirmed by LONG MD, JOSHUA [phone]) on 02/27/2016 1:09:50 PM       Radiology Dg Chest 2 View  Result Date: 02/27/2016 CLINICAL DATA:  Chest pain with nausea and vomiting. EXAM: CHEST  2 VIEW COMPARISON:  01/16/2016 hand 08/17/2015 FINDINGS: There is slight pulmonary vascular prominence with slight interstitial edema at the lung bases. Slight chronic cardiomegaly. Pacemaker in place. No acute bone abnormality. No definitive effusions. Aortic valve replacement. IMPRESSION: Slight interstitial pulmonary edema. Electronically Signed   By: Francene Boyers M.D.   On: 02/27/2016 15:08    Procedures Procedures (including critical care time)  Medications Ordered in ED Medications  ondansetron (ZOFRAN) injection 4 mg (4 mg Intravenous Given 02/27/16 1343)     Initial Impression / Assessment and Plan /  ED Course  I have reviewed the triage vital signs and the nursing notes.  Pertinent labs & imaging results that were available during my care of the patient were reviewed by me and considered in my medical decision making (see chart for details).  Clinical Course    Patient presents with chest pain and episode of vomiting that occurred prior to arrival at nursing facility. History of dementia, CAD,  CHF and A. fib with pacemaker. Nursing facility reports patient has been eating and drinking less this week. VSS. Exam revealed mild rails and basilar lobes. No leg swelling. Exam limited due to language barrier and dementia. EKG showed sinus rhythm with left bundle branch block, unchanged from prior. Troponin negative. BUN 30, creatinine 1.88. WBC 11.4. BNP 203.5. Remaining labs consistent with patient's baseline values. Chest x-ray showed slight interstitial pulmonary edema. I was notified by nurse that while patient was on room air his oxygen dropped to 93%, patient was placed on 5 L nasal cannula with improvement of oxygen to 100%. Reevaluation patient is sitting resting comfortably without any signs of respiratory distress. Patient was then placed on 3 L nasal cannula, O2 saturation 99%. Consulted hospitalist for admission for fluid overload due to CHF exacerbation. Dr. Malachi BondsShort agrees to admission. Orders placed for telemetry bed.   Final Clinical Impressions(s) / ED Diagnoses   Final diagnoses:  Acute on chronic congestive heart failure, unspecified congestive heart failure type [facility](HCC)    New Prescriptions New Prescriptions   No medications on file     Barrett Henleicole Elizabeth Nadeau, PA-C 02/27/16 1549    Maia PlanJoshua G Long, MD 02/27/16 [phone]

## 2016-02-27 NOTE — ED Notes (Signed)
CBG 171 

## 2016-02-27 NOTE — ED Notes (Signed)
Dr. Short at bedside. 

## 2016-02-27 NOTE — H&P (Signed)
History and Physical    Harold Jones ZOX:[id number] DOB: 09/02/1927 DOA: 02/27/2016  Referring MD/NP/PA: Melburn HakeNicole Nadeau PCP: No primary care provider on file.  Patient [facility]: [facility]  Chief Complaint: chest pain, nausea, vomiting Speaks Bosnian, interpreter used.   HPI: Harold Jones is a 82 y.o. male with dementia, atrial fibrillation with symptomatic bradycardia s/p PPM/ICD insertion, GIB requiring blood transfusion and therefore no longer on anticoagulation, CAD, s/p bioprosthetic aortic valve replacement, diabetes mellitus type 2 who [facility].  History obtained from ER MD who spoke with the patient's facility and from patient.  Per SNF RN, patient has not been eating as well this week.  This morning, he was in a common area when he suddenly grasped his chest and ran into the bathroom where he vomited "lime green stuff".  Afterwards, he was pale and clammy.  They gave him 4 baby aspirin and he was transported to the ER for further evaluation.  He reports that he was having some lower abdominal pain this morning and suddenly felt feverish and vomited.  At the time of my exam, he denied fevers, chills, headache, sore throat.  He has had a cough the last few days.  Denies orthopnea and lower extremity edema and his weight appears stable from prior admissions.  Denies nausea, diarrhea, and constipation although he cannot remember when his last BM was.  Endorses difficulty urinating and dysuria.  Of note, he changed his answers to some of the questions.    ED Course:  Afebrile, RR 28 and placed on 3L O2.  BP elevated 179/73.  Labs notable for WBC 11.4 and mild AKI.  BNP 203.  CXR demonstrated interstitial edema.  UA negative.  Troponin negative.  ECG demonstrated stable LBBB with T-wave inversions in the lateral leads.  He was given zofran once and is being admitted for possible chest pain.    Review of Systems:  Limited by confusion/dementia General:  Subjective  fever at SNF prior to transfer.  Denies chills HEENT:  Denies sore throat CV:  Denies current chest pain and lower extremity edema.  PULM:  Denies SOB, wheezing, positive cough.   GI:  Denies constipation, diarrhea. positive vomiting GU:  positive dysuria, frequency, urgency ENDO:  Denies polyuria, polydipsia.   HEME:  Denies hematemesis, blood in stools, melena, abnormal bruising or bleeding.  LYMPH:  Denies lymphadenopathy.   MSK:  Denies arthralgias, myalgias.   DERM:  Denies skin rash or ulcer.   NEURO:  Denies focal numbness, weakness, slurred speech, confusion, facial droop.  PSYCH:  Denies anxiety and depression.    Past Medical History:  Diagnosis Date  . Aortic valve disorder   . BPH without obstruction/lower urinary tract symptoms   . Chest wall pain following surgery   . COGNITIVE DEFICITS DUE CEREBROVASCULAR DISEASE 09/09/2009   Qualifier: Diagnosis of  By: Delrae AlfredMulberry MD, Lanora ManisElizabeth    . CORONARY ARTERY DISEASE 02/27/2007   Qualifier: Diagnosis of  By: Barbaraann Barthelankins MD, [place]    . CVA 02/28/2004   Annotation: embolic Qualifier: Diagnosis of  By: Barbaraann Barthelankins MD, [place]    . Diabetes mellitus   . DYSLIPIDEMIA 02/27/2007   Qualifier: Diagnosis of  By: Barbaraann Barthelankins MD, [place]    . FIBRILLATION, ATRIAL 08/31/2002   Qualifier: Diagnosis of  By: Barbaraann Barthelankins MD, [place]    . Grief   . Hypertension   . Neuropathic pain   . Osteoarthritis of left knee   . Physical deconditioning   . Symptomatic bradycardia  08/16/15   [employer] PPM, Dr. Ladona Ridgel    Past Surgical History:  Procedure Laterality Date  . CARDIAC SURGERY     bioprosthetic AV replacement  . EP IMPLANTABLE DEVICE N/A 08/16/2015   [facility] PPM, Dr. Ladona Ridgel  . ESOPHAGOGASTRODUODENOSCOPY (EGD) WITH PROPOFOL N/A 01/17/2016   Procedure: ESOPHAGOGASTRODUODENOSCOPY (EGD) WITH PROPOFOL;  Surgeon: Bernette Redbird, MD;  Location: [facility] ENDOSCOPY;  Service: Endoscopy;  Laterality: N/A;     reports that he has never smoked. He has  never used smokeless tobacco. He reports that he does not drink alcohol or use drugs.  No Known Allergies  Family History  Problem Relation Age of Onset  . Other Mother     old age  . CAD Father   . CAD Brother     Prior to Admission medications   Medication Sig Start Date End Date Taking? Authorizing Provider  acetaminophen (MAPAP) 325 MG tablet Take 650 mg by mouth 2 (two) times daily. NOT TO EXCEED 3,000 MG/DAY FROM ALL (COMBINED) SOURCES   Yes Historical Provider, MD  aspirin 81 MG chewable tablet Chew 324 mg by mouth once. IF NEEDED FOR PAIN OR CHEST PAIN   Yes Historical Provider, MD  atorvastatin (LIPITOR) 10 MG tablet Take 10 mg by mouth daily.   Yes Historical Provider, MD  docusate sodium (COLACE) 100 MG capsule Take 100 mg by mouth 2 (two) times daily.   Yes Historical Provider, MD  fluticasone (FLONASE ALLERGY RELIEF) 50 MCG/ACT nasal spray Place 2 sprays into both nostrils daily. For 14 days (Start date: 02/22/16)   Yes Historical Provider, MD  furosemide (LASIX) 20 MG tablet Take 20 mg by mouth daily. Reported on 08/17/2015   Yes Historical Provider, MD  gabapentin (NEURONTIN) 300 MG capsule Take 300 mg by mouth 2 (two) times daily.   Yes Historical Provider, MD  glipiZIDE (GLUCOTROL) 5 MG tablet Take 5 mg by mouth daily before breakfast.  11/14/13  Yes Esperanza Sheets, MD  lisinopril (PRINIVIL,ZESTRIL) 20 MG tablet Take 20 mg by mouth daily.   Yes Historical Provider, MD  pantoprazole (PROTONIX) 40 MG tablet Take 1 tablet (40 mg total) by mouth daily. 01/19/16  Yes Richarda Overlie, MD  polyethylene glycol (MIRALAX / GLYCOLAX) packet Take 17 g by mouth daily. Mix with 8 ounces of liquid/Hold for loose stools   Yes Historical Provider, MD  tamsulosin (FLOMAX) 0.4 MG CAPS capsule Take 0.4 mg by mouth every morning.  02/12/15  Yes Historical Provider, MD  traMADol (ULTRAM) 50 MG tablet Take 50 mg by mouth every 6 (six) hours as needed (FOR KNEE PAIN).   Yes Historical Provider, MD     Physical Exam: Vitals:   02/27/16 1430 02/27/16 1500 02/27/16 1515 02/27/16 1545  BP: 143/57 144/60 136/79 142/61  Pulse: 60 (!) 53 (!) 51 (!) 52  Resp: 19     Temp:      SpO2: 97% 99% 98% 99%  Weight:      Height:        Constitutional: NAD, calm, comfortable.  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 Posterior pharynx clear of any exudate or lesions.  Normal dentition.  Possible blood blister middle of lip Neck: normal, supple, no masses, no thyromegaly Respiratory:  Course rales at the bilateral bases, + rhonchi, no wheezes.  No accessory muscle use.  Cardiovascular:  Regular rate and rhythm, no murmurs / rubs / gallops.  No extremity edema. 2+ pedal pulses. No carotid bruits.  Abdomen:   Hypoactive BS, soft, nondistended, TTP over the suprapubic area without rebound or guarding.   Musculoskeletal:  No clubbing / cyanosis. No joint deformity upper and lower extremities. Good ROM, no contractures. Normal muscle tone.  No lower extremity edema.   Skin: no rashes, lesions, ulcers. No induration Neurologic: CN 2-12 grossly intact. Sensation intact, DTR normal. Strength 5/5 in all 4.  Psychiatric: Normal judgment and insight. Alert and oriented to person and place. Normal mood.   Labs on Admission: I have personally reviewed following labs and imaging studies  CBC:  Recent Labs Lab 02/27/16 1314  WBC 11.4*  NEUTROABS 9.2*  HGB 10.0*  HCT 32.8*  MCV 81.2  PLT 175   Basic Metabolic Panel:  Recent Labs Lab 02/27/16 1314  NA 138  K 4.5  CL 108  CO2 20*  GLUCOSE 188*  BUN 30*  CREATININE 1.88*  CALCIUM 9.2   GFR: Estimated Creatinine Clearance: 31 mL/min (by C-G formula based on SCr of 1.88 mg/dL). Liver Function Tests:  Recent Labs Lab 02/27/16 1314  AST 16  ALT 12*  ALKPHOS 38  BILITOT 0.8  PROT 7.2  ALBUMIN 3.7    Recent Labs Lab 02/27/16 1314  LIPASE  18   No results for input(s): AMMONIA in the last 168 hours. Coagulation Profile: No results for input(s): INR, PROTIME in the last 168 hours. Cardiac Enzymes: No results for input(s): CKTOTAL, CKMB, CKMBINDEX, TROPONINI in the last 168 hours. BNP (last 3 results) No results for input(s): PROBNP in the last 8760 hours. HbA1C: No results for input(s): HGBA1C in the last 72 hours. CBG:  Recent Labs Lab 02/27/16 1317  GLUCAP 171*   Lipid Profile: No results for input(s): CHOL, HDL, LDLCALC, TRIG, CHOLHDL, LDLDIRECT in the last 72 hours. Thyroid Function Tests: No results for input(s): TSH, T4TOTAL, FREET4, T3FREE, THYROIDAB in the last 72 hours. Anemia Panel: No results for input(s): VITAMINB12, FOLATE, FERRITIN, TIBC, IRON, RETICCTPCT in the last 72 hours. Urine analysis:    Component Value Date/Time   COLORURINE YELLOW 02/27/2016 1534   APPEARANCEUR CLEAR 02/27/2016 1534   LABSPEC 1.018 02/27/2016 1534   PHURINE 5.5 02/27/2016 1534   GLUCOSEU NEGATIVE 02/27/2016 1534   HGBUR TRACE (A) 02/27/2016 1534   BILIRUBINUR NEGATIVE 02/27/2016 1534   KETONESUR NEGATIVE 02/27/2016 1534   PROTEINUR 30 (A) 02/27/2016 1534   UROBILINOGEN 0.2 03/01/2015 1940   NITRITE NEGATIVE 02/27/2016 1534   LEUKOCYTESUR NEGATIVE 02/27/2016 1534   Sepsis Labs: @LABRCNTIP (procalcitonin:4,lacticidven:4) )No results found for this or any previous visit (from the past 240 hour(s)).   Radiological Exams on Admission: Dg Chest 2 View  Result Date: 02/27/2016 CLINICAL DATA:  Chest pain with nausea and vomiting. EXAM: CHEST  2 VIEW COMPARISON:  01/16/2016 hand 08/17/2015 FINDINGS: There is slight pulmonary vascular prominence with slight interstitial edema at the lung bases. Slight chronic cardiomegaly. Pacemaker in place. No acute bone abnormality. No definitive effusions. Aortic valve replacement. IMPRESSION: Slight interstitial pulmonary edema. Electronically Signed   By: Francene Boyers M.D.   On:  02/27/2016 15:08    EKG: Independently reviewed. Sinus rhythm, LBBB with stable T-wave inversions in the lateral leads, unchanged from prior  Assessment/Plan Active Problems:   CHF exacerbation (HCC)   Acute hypoxic respiratory failure likely due to acute on chronic diastolic heart failure versus aspiration pneumonia or less likely, HCAP.  Patient has a history of dysphagia and previous aspiration pneumonia and his WBC  is double his baseline with predominant neutrophils suggesting aspiration.  Alternatively, he is afebrile and his CXR looks like interstitial edema.  Technically, he meets criteria for HCAP because he was hospitalized recently and resides in SNF.  I am going to initially treat for HCAP and aspiration pneumonia with vancomycin and zosyn, however, if his MRSA PCR is negative, pharmacy can discontinue his vancomycin.  Low threshold for narrowing his therapy or discontinuing altogether if he rapidly improves with diuresis.   -  Vancomycin and zosyn -  MRSA PCR -  Repeat speech therapy assessment -  Daily weights -  Strict I/O -  Lasix 60mg  IV once now and repeat CXR in AM -  Repeat BMP in AM -  If BMP shows improvement in creatinine and CXR improvement in edema with lasix, would continue to diurese.  If not improved and creatinine worsening, would start judicious fluids.    Possible chest pain, ECG unchanged -  Telemetry -  Cycle troponins -  Repeat ECG -  Repeat ECHO -  Aspirin -  No beta blocker due to bradycardia  Nausea and vomiting with abdominal pain.  LFTs, lipase, and UA were normal.  May be viral or sign of ACS.   -  KUB:  No evidence of obstruction -  Bladder scan and foley placement if needed -  If diarrhea, enteric precautions and GI pathogen panel -  CT scan if persistent vomiting  Mild AKI, may be due to worsening diastolic heart failure vs. Dehydration from not eating and drinking much this week.   - hold lisinopril -  FENa - Bladder scan - IVF   A-fib  with history of bradycardia, now s/p PPM/ICD insertion.  LBBB on ECG is stable. -  PPM interrogation was normal -  No anticoagulation due to GIB   Recent GIB without clear etiology, however, he had some focal mucosal hemorrhages in the gastric antrum on EGD -  Continue PPI -  No anticoagulation  BPH with bladder tenderness -  PVR -  Continue flomax  Dementia, confusion.    Diabetes mellitus type 2 with A1c 8.3 on 01/12/2016  -  Hold oral medications -  SSI  DVT prophylaxis: lovenox  Code Status:  DNR after extensive conversation with family regarding GOC using interpreter Family Communication: patient, his daughter Philmore Pali [facility]) and Conni Slipper (grandson) via interpreter  Disposition Plan:  3-4 days Consults called: none  Admission status: observation telemetry   Renae Fickle MD [facility] Pager [phone]  If 7PM-7AM, please contact night-coverage [email or web address] Password [email or web address]  02/27/2016, 4:45 PM

## 2016-02-27 NOTE — ED Notes (Signed)
Patient transported to X-ray 

## 2016-02-27 NOTE — Progress Notes (Signed)
Pharmacy Antibiotic Note  Harold Jones is a 82 y.o. male admitted on 02/27/2016 with pneumonia, aspiration versus HCAP.  Pharmacy has been consulted for vancomycin and zosyn. AKI noted with sCr 1.88 and CrCl 31 ml/min. Per Dr. Malachi BondsShort, ok to discontinue vancomycin if MRSA PCR is negative - currently pending.  Vancomycin trough goal 15-20  Plan: 1) Vancomycin 1250mg  x 1 - f/u MRSA PCR before entering further maintenance doses 2) Zosyn 3.375g IV q8 (4 hour infusion) 3) Follow renal function, cultures, LOT, level if needed  Height: 6' (182.9 cm) Weight: 190 lb (86.2 kg) IBW/kg (Calculated) : 77.6  Temp (24hrs), Avg:97.8 F (36.6 C), Min:97.7 F (36.5 C), Max:97.8 F (36.6 C)   Recent Labs Lab 02/27/16 1314 02/27/16 1840  WBC 11.4*  --   CREATININE 1.88*  --   LATICACIDVEN  --  1.1    Estimated Creatinine Clearance: 31 mL/min (by C-G formula based on SCr of 1.88 mg/dL).    No Known Allergies  Antimicrobials this admission: 8/28 Vanc >> 8/28 Zosyn >>   Dose adjustments this admission: n/a  Microbiology results: none  Thank you for allowing pharmacy to be a part of this patient's care.  Fredrik RiggerMarkle, Levada Bowersox Sue 02/27/2016 8:23 PM

## 2016-02-27 NOTE — ED Notes (Signed)
[place] interpert on skype - Vladimir - [phone]

## 2016-02-27 NOTE — ED Triage Notes (Signed)
Ar[facility] via [facility]. Called for possible chest pain nausea and emesis. EMS EKG afib CBG 181 IV Zofran 4mg  administered. History of dementia.

## 2016-02-27 NOTE — ED Notes (Signed)
This RN spoke with Molli KnockStephanie Martinez med tech from [facility]. She stated that she gave the pt "4 of the 81 mg Asprin because he had gotten clammy, was short of breath, and was rubbing his chest and pounding on it. After that he started crying, got up, went to the sink and threw up lime green stuff". She states that the pt does not normally wear oxygen. She states that the pt uses a cane to walk. She states that the pt is on a regular diet and that the pt is continent.

## 2016-02-27 NOTE — ED Notes (Signed)
This RN interrogated the pts pacemaker. Joey from [employer] called and stated that there have been no events recorded. He stated that in order to record the heart rate would have to be over 160. He also stated that if the heart rate falls below 50 then pacing would begin. He also stated that it is a single chamber unit.

## 2016-02-28 ENCOUNTER — Observation Stay (HOSPITAL_COMMUNITY): Payer: Medicare Other

## 2016-02-28 DIAGNOSIS — N179 Acute kidney failure, unspecified: Secondary | ICD-10-CM

## 2016-02-28 DIAGNOSIS — I4891 Unspecified atrial fibrillation: Secondary | ICD-10-CM | POA: Diagnosis not present

## 2016-02-28 DIAGNOSIS — J690 Pneumonitis due to inhalation of food and vomit: Secondary | ICD-10-CM | POA: Diagnosis not present

## 2016-02-28 DIAGNOSIS — I5033 Acute on chronic diastolic (congestive) heart failure: Secondary | ICD-10-CM

## 2016-02-28 DIAGNOSIS — R079 Chest pain, unspecified: Secondary | ICD-10-CM | POA: Diagnosis not present

## 2016-02-28 DIAGNOSIS — N183 Chronic kidney disease, stage 3 (moderate): Secondary | ICD-10-CM | POA: Diagnosis not present

## 2016-02-28 DIAGNOSIS — R0789 Other chest pain: Secondary | ICD-10-CM | POA: Diagnosis not present

## 2016-02-28 DIAGNOSIS — I482 Chronic atrial fibrillation: Secondary | ICD-10-CM | POA: Diagnosis not present

## 2016-02-28 LAB — GLUCOSE, CAPILLARY
GLUCOSE-CAPILLARY: 143 mg/dL — AB (ref 65–99)
GLUCOSE-CAPILLARY: 337 mg/dL — AB (ref 65–99)
Glucose-Capillary: 143 mg/dL — ABNORMAL HIGH (ref 65–99)
Glucose-Capillary: 188 mg/dL — ABNORMAL HIGH (ref 65–99)
Glucose-Capillary: 219 mg/dL — ABNORMAL HIGH (ref 65–99)

## 2016-02-28 LAB — BASIC METABOLIC PANEL
ANION GAP: 8 (ref 5–15)
BUN: 27 mg/dL — ABNORMAL HIGH (ref 6–20)
CO2: 26 mmol/L (ref 22–32)
Calcium: 9.3 mg/dL (ref 8.9–10.3)
Chloride: 105 mmol/L (ref 101–111)
Creatinine, Ser: 1.7 mg/dL — ABNORMAL HIGH (ref 0.61–1.24)
GFR, EST AFRICAN AMERICAN: 40 mL/min — AB (ref >60)
GFR, EST NON AFRICAN AMERICAN: 35 mL/min — AB (ref >60)
Glucose, Bld: 152 mg/dL — ABNORMAL HIGH (ref 65–99)
POTASSIUM: 4.6 mmol/L (ref 3.5–5.1)
SODIUM: 139 mmol/L (ref 135–145)

## 2016-02-28 LAB — STREP PNEUMONIAE URINARY ANTIGEN: STREP PNEUMO URINARY ANTIGEN: NEGATIVE

## 2016-02-28 LAB — TROPONIN I
TROPONIN I: 0.42 ng/mL — AB (ref <0.03)
TROPONIN I: 0.29 ng/mL — AB (ref <0.03)

## 2016-02-28 LAB — MRSA PCR SCREENING: MRSA by PCR: NEGATIVE

## 2016-02-28 LAB — HIV ANTIBODY (ROUTINE TESTING W REFLEX): HIV SCREEN 4TH GENERATION: NONREACTIVE

## 2016-02-28 MED ORDER — MAGNESIUM HYDROXIDE 400 MG/5ML PO SUSP: 15.0000 mL | Freq: Once | ORAL | Status: DC

## 2016-02-28 MED ORDER — RESOURCE THICKENUP CLEAR PO POWD
ORAL | Status: DC | PRN
  Filled 2016-02-28: qty 125

## 2016-02-28 MED ORDER — INSULIN ASPART 100 UNIT/ML ~~LOC~~ SOLN
6.0000 [IU] | Freq: Once | SUBCUTANEOUS | Status: AC
  Administered 2016-02-28: 6 [IU] via SUBCUTANEOUS

## 2016-02-28 NOTE — Consult Note (Signed)
CARDIOLOGY CONSULT NOTE   Patient ID: Harold Jones MRN: [id number] DOB/AGE: 06-Oct-1927 82 y.o.  Admit date: 02/27/2016  Primary Physician   No primary care provider on file. Primary Cardiologist   Dr. Ladona Ridgel (previously Dr. Jens Som last seen 01/18/10) Reason for Consultation   Elevated troponin Requesting Physician  Dr. Jerral Ralph  HPI: Harold Jones is a 82 y.o. male with a history of aortic valve disease status post previous bioprosthetic valve as well as replacement of thoracic aneurysm, PPM 08/2015 for symptomatic bradycardia, chronic atrial fibrillation not on anticoagulation due to recent GI bleed requiring transfusion, CVA, diabetes, hypertension, hyperlipidemia and vascular dementia who lives at [facility] and presented for evaluation of chest pain.  He is originally from [place]. Language line used for conversaion. He does not speak [place] or understands it.  Admitted with a GI bleed 12/2015 with positive stool and hemoglobin down to 6.8 on low-dose Xarelto 15 mg for chronic A. fib. Repeat echo during this admission shows elevated gradient for aortic stenosis. Advise to hold anticoagulation indefinitely given age and significant bleeding by Dr. Eden Emms.  Endoscopy did not show any ulcer.   Note, declining appetite recently. Intermittent chest port for the past few weeks. Yesterday morning he had a sudden episode of chest pain associated with vomiting. Admits to having shortness of breath with that. He also had cough for the past few days. Denies orthopnea, lower extremity edema, orthopnea, PND, syncope. His other main complaint is unable to walk and leg weakness.  EKG shows atrial fibrillation at rate of 67 bpm, chronic left bundle branch block and T-wave inversion in lead 1 and aVL which appears similar to prior EKG. BNP is 203. Point-of-care troponin 0.00. Troponin I of 0.28--> 0.42--> 0.29. TSH 0.22. Serum creatinine of 1.88-->1.7. CXR concerning for his CHF and infiltrate. Given Lasix  60 mg 1.   Past Medical History:  Diagnosis Date  . Aortic valve disorder   . BPH without obstruction/lower urinary tract symptoms   . Chest wall pain following surgery   . COGNITIVE DEFICITS DUE CEREBROVASCULAR DISEASE 09/09/2009   Qualifier: Diagnosis of  By: Delrae Alfred MD, Lanora Manis    . CORONARY ARTERY DISEASE 02/27/2007   Qualifier: Diagnosis of  By: Barbaraann Barthel MD, [place]    . CVA 02/28/2004   Annotation: embolic Qualifier: Diagnosis of  By: Barbaraann Barthel MD, [place]    . Dementia    Per paper work from [employer]  . Diabetes mellitus   . DYSLIPIDEMIA 02/27/2007   Qualifier: Diagnosis of  By: Barbaraann Barthel MD, [place]    . FIBRILLATION, ATRIAL 08/31/2002   Qualifier: Diagnosis of  By: Barbaraann Barthel MD, [place]    . Grief   . Hypertension   . Neuropathic pain   . Osteoarthritis of left knee   . Physical deconditioning   . Symptomatic bradycardia 08/16/15   [employer] PPM, Dr. Ladona Ridgel     Past Surgical History:  Procedure Laterality Date  . CARDIAC SURGERY     bioprosthetic AV replacement  . EP IMPLANTABLE DEVICE N/A 08/16/2015   [facility] PPM, Dr. Ladona Ridgel  . ESOPHAGOGASTRODUODENOSCOPY (EGD) WITH PROPOFOL N/A 01/17/2016   Procedure: ESOPHAGOGASTRODUODENOSCOPY (EGD) WITH PROPOFOL;  Surgeon: Bernette Redbird, MD;  Location: [facility] ENDOSCOPY;  Service: Endoscopy;  Laterality: N/A;    No Known Allergies  I have reviewed the patient's current medications . aspirin EC  81 mg Oral Daily  . atorvastatin  10 mg Oral Daily  . docusate sodium  100 mg Oral BID  . enoxaparin (LOVENOX) injection  30 mg Subcutaneous Q24H  . fluticasone  2 spray Each Nare Daily  . gabapentin  300 mg Oral BID  . insulin aspart  0-9 Units Subcutaneous TID WC  . pantoprazole  40 mg Oral Daily  . polyethylene glycol  17 g Oral Daily  . sodium chloride flush  3 mL Intravenous Q12H  . tamsulosin  0.4 mg Oral q morning - 10a     sodium chloride, acetaminophen, ondansetron (ZOFRAN) IV, RESOURCE THICKENUP CLEAR,  sodium chloride flush  Prior to Admission medications   Medication Sig Start Date End Date Taking? Authorizing Provider  acetaminophen (MAPAP) 325 MG tablet Take 650 mg by mouth 2 (two) times daily. NOT TO EXCEED 3,000 MG/DAY FROM ALL (COMBINED) SOURCES   Yes Historical Provider, MD  aspirin 81 MG chewable tablet Chew 324 mg by mouth once. IF NEEDED FOR PAIN OR CHEST PAIN   Yes Historical Provider, MD  atorvastatin (LIPITOR) 10 MG tablet Take 10 mg by mouth daily.   Yes Historical Provider, MD  docusate sodium (COLACE) 100 MG capsule Take 100 mg by mouth 2 (two) times daily.   Yes Historical Provider, MD  fluticasone (FLONASE ALLERGY RELIEF) 50 MCG/ACT nasal spray Place 2 sprays into both nostrils daily. For 14 days (Start date: 02/22/16)   Yes Historical Provider, MD  furosemide (LASIX) 20 MG tablet Take 20 mg by mouth daily. Reported on 08/17/2015   Yes Historical Provider, MD  gabapentin (NEURONTIN) 300 MG capsule Take 300 mg by mouth 2 (two) times daily.   Yes Historical Provider, MD  glipiZIDE (GLUCOTROL) 5 MG tablet Take 5 mg by mouth daily before breakfast.  11/14/13  Yes Esperanza Sheets, MD  lisinopril (PRINIVIL,ZESTRIL) 20 MG tablet Take 20 mg by mouth daily.   Yes Historical Provider, MD  pantoprazole (PROTONIX) 40 MG tablet Take 1 tablet (40 mg total) by mouth daily. 01/19/16  Yes Richarda Overlie, MD  polyethylene glycol (MIRALAX / GLYCOLAX) packet Take 17 g by mouth daily. Mix with 8 ounces of liquid/Hold for loose stools   Yes Historical Provider, MD  tamsulosin (FLOMAX) 0.4 MG CAPS capsule Take 0.4 mg by mouth every morning.  02/12/15  Yes Historical Provider, MD  traMADol (ULTRAM) 50 MG tablet Take 50 mg by mouth every 6 (six) hours as needed (FOR KNEE PAIN).   Yes Historical Provider, MD     Social History   Social History  . Marital status: Married    Spouse name: N/A  . Number of children: N/A  . Years of education: N/A   Occupational History  . Not on file.   Social History  Main Topics  . Smoking status: Never Smoker  . Smokeless tobacco: Never Used  . Alcohol use No  . Drug use: No  . Sexual activity: Not on file   Other Topics Concern  . Not on file   Social History Narrative  . No narrative on file    Family Status  Relation Status  . Mother Deceased  . Father Deceased  . Brother    Family History  Problem Relation Age of Onset  . Other Mother     old age  . CAD Father   . CAD Brother       ROS:  Full 14 point review of systems complete and found to be negative unless listed above.  Physical Exam: Blood pressure (!) 104/46, pulse (!) 51, temperature 97.6 F (36.4 C), temperature source Oral, resp. rate 16, height 6' (1.829 m), weight  183 lb 6.4 oz (83.2 kg), SpO2 97 %.  General: Well developed, well nourished, male in no acute distress Head: Eyes PERRLA, No xanthomas. Normocephalic and atraumatic, oropharynx without edema or exudate.  Lungs: Resp regular and unlabored. Diminished breath sound bibasilar.  Heart: RRR no s3, s4, or murmurs..   Neck: No carotid bruits. No lymphadenopathy.  No JVD. Abdomen: Bowel sounds present, abdomen soft and non-tender without masses or hernias noted. Msk:  No spine or cva tenderness. No weakness, no joint deformities or effusions. Extremities: No clubbing, cyanosis or edema. DP/PT/Radials 2+ and equal bilaterally. Neuro: Alert and oriented X 3. No focal deficits noted. Psych:  Good affect, responds appropriately Skin: No rashes or lesions noted.  Labs:   Lab Results  Component Value Date   WBC 11.4 (H) 02/27/2016   HGB 10.0 (L) 02/27/2016   HCT 32.8 (L) 02/27/2016   MCV 81.2 02/27/2016   PLT 175 02/27/2016   No results for input(s): INR in the last 72 hours.  Recent Labs Lab 02/27/16 1314 02/28/16 0257  NA 138 139  K 4.5 4.6  CL 108 105  CO2 20* 26  BUN 30* 27*  CREATININE 1.88* 1.70*  CALCIUM 9.2 9.3  PROT 7.2  --   BILITOT 0.8  --   ALKPHOS 38  --   ALT 12*  --   AST 16  --     GLUCOSE 188* 152*  ALBUMIN 3.7  --    Magnesium  Date Value Ref Range Status  03/06/2015 1.8 1.7 - 2.4 mg/dL Final    Recent Labs  96/04/54 2057 02/28/16 0257 02/28/16 0836  TROPONINI 0.28* 0.42* 0.29*    Recent Labs  02/27/16 1320  TROPIPOC 0.02   Pro B Natriuretic peptide (BNP)  Date/Time Value Ref Range Status  12/24/2007 01:16 PM 138.0 (H) 0.0 - 100.0 pg/mL Final  09/27/2007 03:35 AM 43.0  Final   Lab Results  Component Value Date   CHOL 118 10/09/2011   HDL 27 (L) 10/09/2011   LDLCALC 66 10/09/2011   TRIG 125 10/09/2011   No results found for: DDIMER Lipase  Date/Time Value Ref Range Status  02/27/2016 01:14 PM 18 11 - 51 U/L Final   TSH  Date/Time Value Ref Range Status  02/27/2016 08:57 PM 0.227 (L) 0.350 - 4.500 uIU/mL Final  06/23/2013 07:16 PM 0.184 (L) 0.350 - 4.500 uIU/mL Final    Comment:    Performed at [facility]   T3, Free  Date/Time Value Ref Range Status  06/25/2013 05:01 AM 2.7 2.3 - 4.2 pg/mL Final    Comment:    Performed at [facility]   Vitamin B-12  Date/Time Value Ref Range Status  06/23/2013 07:16 PM 324 211 - 911 pg/mL Final    Comment:    Performed at [facility]   Ferritin  Date/Time Value Ref Range Status  01/15/2016 11:34 AM 17 (L) 24 - 336 ng/mL Final   TIBC  Date/Time Value Ref Range Status  01/15/2016 11:34 AM 423 250 - 450 ug/dL Final   Iron  Date/Time Value Ref Range Status  01/15/2016 11:34 AM 13 (L) 45 - 182 ug/dL Final    Echo: 0/98/11 Study Conclusions  - Left ventricle: The cavity size was normal. There was moderate   concentric hypertrophy. Systolic function was normal. The   estimated ejection fraction was in the range of 60% to 65%. Wall   motion was normal; there were no regional wall motion  abnormalities. The study was not technically sufficient to allow   evaluation of LV diastolic dysfunction due to atrial   fibrillation. - Aortic valve: Inadequately  visualized bioprosthetic AVR to   comment on structure, but by Doppler appears to be moderate AS   with mild perivalvular AI. There was mild stenosis. There was   moderate regurgitation. - Mitral valve: There was mild regurgitation. - Left atrium: The atrium was severely dilated. - Right atrium: The atrium was severely dilated. - Tricuspid valve: There was moderate regurgitation. - Pulmonary arteries: PA peak pressure: 59 mm Hg (S).  Impressions:  - The right ventricular systolic pressure was increased consistent   with moderate pulmonary hypertension.  ------------------------------------------------------------------- Aortic valve:  Inadequately visualized bioprosthetic AVR to comment on structure, but by Doppler appears to be moderate AS with mild perivalvular AI.  Doppler:   There was mild stenosis.   There was moderate regurgitation.    VTI ratio of LVOT to aortic valve: 0.31. Peak velocity ratio of LVOT to aortic valve: 0.29. Mean velocity ratio of LVOT to aortic valve: 0.31.    Mean gradient (S): 26 mm Hg. Peak gradient (S): 55 mm Hg.   ECG:  EKG shows atrial fibrillation at rate of 67 bpm, chronic left bundle branch block and T-wave inversion in lead 1 and aVL which appears similar to prior EKG.  Radiology:  Dg Chest 2 View  Result Date: 02/27/2016 CLINICAL DATA:  Chest pain with nausea and vomiting. EXAM: CHEST  2 VIEW COMPARISON:  01/16/2016 hand 08/17/2015 FINDINGS: There is slight pulmonary vascular prominence with slight interstitial edema at the lung bases. Slight chronic cardiomegaly. Pacemaker in place. No acute bone abnormality. No definitive effusions. Aortic valve replacement. IMPRESSION: Slight interstitial pulmonary edema. Electronically Signed   By: Francene Boyers M.D.   On: 02/27/2016 15:08   Dg Chest Port 1 View  Result Date: 02/28/2016 CLINICAL DATA:  Heart failure. EXAM: PORTABLE CHEST 1 VIEW COMPARISON:  02/27/2016,, 03/01/2015 11/12/2013, 06/23/2013,  10/08/2011. FINDINGS: Cardiac pacer with lead tip over the right ventricle. Prior cardiac valve replacement. Cardiomegaly with mild diffuse interstitial prominence suggesting a mild component of congestive heart failure. Underlying chronic interstitial lung disease cannot be excluded . No interim change. No pleural effusion or pneumothorax . IMPRESSION: Cardiac pacer noted with lead tip over the right ventricle. No pneumothorax. 2. Prior cardiac valve replacement. Cardiomegaly with mild bilateral from interstitial prominence suggesting a mild component of congestive heart failure. Underlying chronic interstitial lung disease cannot be excluded . Electronically Signed   By: Maisie Fus  Register   On: 02/28/2016 07:17   Dg Abd Portable 1v  Result Date: 02/27/2016 CLINICAL DATA:  Chest pain and emesis.  Dementia.  Abdominal pain. EXAM: PORTABLE ABDOMEN - 1 VIEW COMPARISON:  CT 01/12/2016 FINDINGS: Single portable view abdomen and pelvis. Non-obstructive bowel gas pattern. Calcification projecting over the right kidney is likely vascular, when compared to the prior motion degraded CT. No other abnormal abdominal calcifications. Probable phleboliths in the pelvis. Right hip osteoarthritis. S shaped lumbar spine curvature with lumbar spondylosis. IMPRESSION: No acute findings. Electronically Signed   By: Jeronimo Greaves M.D.   On: 02/27/2016 17:06    ASSESSMENT AND PLAN:     1. Chest pain and elevated troponin - Difficult to obtain history due to language barrier. EKG shows atrial fibrillation (? P wave) at rate of 67 bpm, chronic left bundle branch block and T-wave inversion in lead 1 and aVL which appears similar to prior EKG. Point-of-care troponin  0.00. Troponin I of 0.28--> 0.42--> 0.29. Currently chest pain-free. Not a good candidate for invasive procedure given recent GIB requiring blood transfusion. Likely medical management. Continue ASA and statin.   2. S/p AVR bioprosthetic valve as well as replacement of  thoracic aneurysm - Echo 12/2015 showed mild AS, mild AI.  VTI ratio of LVOT to aortic valve: 0.31. Peak velocity ratio of LVOT to aortic valve: 0.29. Mean velocity ratio of LVOT to aortic valve: 0.31.   Mean gradient (S): 26 mm Hg. Peak gradient (S): 55 mm Hg.  3. Chronic Afib - Rate controlled. CHADSVASc score of 6.  Not on anticoagulation due to recent GI bleed requiring transfusion. ? PAF with P wave in EKG.   4. Symptomatic Bradycardia due to CHB  s/p PPM ([employer] single-chamber) 08/16/15 - Last device check was normal yesterday in ER. Enrolled in remote check every night.   5. Low TSH - Per primary  6. A on CKD, stage III - serum creatinine of 1.88-->1.7.   7. DM -Per primary  8. Leg weakness - Per primary  9. CXR concerning for his CHF vs infiltrate.  - BNP is 203.  - Given Lasix 60 mg 1. Net I&O negative 1.8L.  - Abx discontinued    Signed: Bhagat,Bhavinkumar, PA 02/28/2016, 1:23 PM Pager [phone]  Co-Sign MD  Patient seen, examined. Available data reviewed. Agree with findings, assessment, and plan as outlined by Chelsea AusVin Bhagat, PA-C. The patient is interviewed with the help of a Art therapistBosnian translator over the telephone. We are asked to see him for chest pain and mildly elevated troponin. The patient has a very complex medical history. He is a really delightful elderly gentleman who lives in an assisted living facility. He has multiple medical problems as outlined in the note above. I have carefully reviewed his medical history which is pertinent for atrial fibrillation, recently discontinued anticoagulation because of GI bleeding requiring transfusion, stroke, diabetes, and permanent pacemaker placement. The patient is undergone bioprosthetic aortic valve replacement and repair of a thoracic aortic aneurysm. Even with the help of translator, history is very difficult to obtain. Patient feels well and states he is much better than when he came into the hospital. He denies chest  pain at present. He denies shortness of breath.  The patient had a recent echocardiogram and was found to have moderately elevated transaortic valve gradients with mild aortic insufficiency. His pulmonary pressures are elevated.  Exam reveals an elderly male in no distress. He has just walked with physical therapy without problems. He has bilateral carotid bruits. Jugular venous pressure is normal. Heart is regular rate and rhythm with a grade 2/6 harsh systolic murmur at the right upper sternal border. There is no diastolic murmur. Lungs are clear. There is no pretibial edema.  I have carefully reviewed the patient's echo findings, laboratory data, and clinical presentation. He really is not a candidate for invasive cardiac evaluation with his chronic kidney disease, advanced age, and inability to take anticoagulant drugs. He doesn't seem to be in any distress and he may have had a mild troponin elevation related to congestive heart failure and chronic kidney disease. I think it is best to treat him medically. Would increase furosemide to 40 mg daily. Otherwise would continue current medications.   Tonny BollmanMichael Eryx Zane, M.D. 02/28/2016 4:22 PM

## 2016-02-28 NOTE — Evaluation (Signed)
Occupational Therapy Evaluation Patient Details Name: Harold Jones MRN: [id number] DOB: 12/19/1927 Today's Date: 02/28/2016    History of Present Illness Rita Jacklynn BueJovanovic is a 82 y.o. male with dementia, atrial fibrillation with symptomatic bradycardia s/p PPM/ICD insertion, GIB requiring blood transfusion and therefore no longer on anticoagulation, CAD, s/p bioprosthetic aortic valve replacement, diabetes mellitus type 2 who resides at ALF. Per ALF RN, p he was in a common area when he suddenly grasped his chest and ran into the bathroom where he vomited "lime green stuff".  Afterwards, he was pale and clammy.    Clinical Impression   Pt admitted with above. He demonstrates the below listed deficits and will benefit from continued OT to maximize safety and independence with BADLs.  Pt presents to OT with generalized weakness, impaired balance, and Lt knee pain (chronic).  He requires min A for ADLs, and should quickly progress back to his baseline status.  He resides at ALF and is quite happy there.  Feel he could transition back to ALF, with a little more support initially and HHOT.  Will follow acutely.      Follow Up Recommendations  Home health OT;Supervision/Assistance - 24 hour    Equipment Recommendations  None recommended by OT    Recommendations for Other Services       Precautions / Restrictions Precautions Precautions: Fall Restrictions Weight Bearing Restrictions: No      Mobility Bed Mobility Overal bed mobility: Modified Independent                Transfers Overall transfer level: Needs assistance Equipment used: Rolling walker (2 wheeled) Transfers: Sit to/from [employer] CorporationStand;Stand Pivot Transfers Sit to Stand: Min guard Stand pivot transfers: Min guard       General transfer comment: min/guard for safety    Balance Overall balance assessment: Needs assistance Sitting-balance support: Feet supported Sitting balance-Leahy Scale: Good     Standing  balance support: During functional activity;Single extremity supported Standing balance-Leahy Scale: Fair Standing balance comment: slight posterior tendency while doing tasks at sink as he fatigued                            ADL Overall ADL's : Needs assistance/impaired Eating/Feeding: Set up;Sitting   Grooming: Wash/dry hands;Wash/dry face;Oral care;Brushing hair;Min guard;Standing   Upper Body Bathing: Set up;Sitting   Lower Body Bathing: Minimal assistance;Sit to/from stand   Upper Body Dressing : Set up;Sitting   Lower Body Dressing: Minimal assistance;Sit to/from stand   Toilet Transfer: Minimal assistance;Ambulation;Comfort height toilet;Regular Toilet;RW;Grab bars   Toileting- Clothing Manipulation and Hygiene: Minimal assistance;Sit to/from stand       Functional mobility during ADLs: Minimal assistance;Rolling walker General ADL Comments: requires min A due to decreased balance      Vision     Perception     Praxis      Pertinent Vitals/Pain Pain Assessment: Faces Faces Pain Scale: Hurts even more Pain Location: Lt knee - chronic in nature  Pain Descriptors / Indicators: Grimacing Pain Intervention(s): Limited activity within patient's tolerance;Monitored during session     Hand Dominance Right   Extremity/Trunk Assessment Upper Extremity Assessment Upper Extremity Assessment: Generalized weakness   Lower Extremity Assessment Lower Extremity Assessment: Defer to PT evaluation   Cervical / Trunk Assessment Cervical / Trunk Assessment: Normal   Communication Communication Communication: Interpreter utilized;Prefers language other than English Geologist, engineering(Interpreter line [place] [phone])   Cognition Arousal/Alertness: Awake/alert Behavior During Therapy: WFL for tasks assessed/performed Overall  Cognitive Status: No family/caregiver present to determine baseline cognitive functioning (h/o dementia )                     General Comments        Exercises       Shoulder Instructions      Home Living Family/patient expects to be discharged to:: Assisted living                             Home Equipment: Gilmer Mor - single point   Additional Comments: Pt has been in [place] ~ 5 years.  Loved to dance and worked as a Museum/gallery exhibitions officer.  Was widowed ~ 6 months ago.      Prior Functioning/Environment Level of Independence: Independent with assistive device(s)        Comments: Amb with cane    OT Diagnosis: Generalized weakness;Acute pain   OT Problem List: Decreased strength;Decreased activity tolerance;Impaired balance (sitting and/or standing);Decreased safety awareness;Decreased knowledge of use of DME or AE;Pain   OT Treatment/Interventions: Self-care/ADL training;DME and/or AE instruction;Therapeutic activities;Patient/family education;Balance training    OT Goals(Current goals can be found in the care plan section) Acute Rehab OT Goals Patient Stated Goal: Return to his ALF OT Goal Formulation: With patient Time For Goal Achievement: 03/06/16 Potential to Achieve Goals: Good ADL Goals Pt Will Perform Grooming: with supervision;standing Pt Will Perform Lower Body Bathing: with supervision;sit to/from stand Pt Will Perform Lower Body Dressing: with supervision;sit to/from stand Pt Will Transfer to Toilet: with supervision;ambulating;regular height toilet;bedside commode;grab bars Pt Will Perform Toileting - Clothing Manipulation and hygiene: with supervision;sit to/from stand  OT Frequency: Min 2X/week   Barriers to D/C:            Co-evaluation PT/OT/SLP Co-Evaluation/Treatment: Yes Reason for Co-Treatment: For patient/therapist safety PT goals addressed during session: Mobility/safety with mobility;Proper use of DME OT goals addressed during session: ADL's and self-care      End of Session Equipment Utilized During Treatment: Rolling walker;Gait belt Nurse Communication: Mobility  status  Activity Tolerance: Patient tolerated treatment well Patient left: in chair;with call bell/phone within reach;with chair alarm set (Pt instructed in use of call bell)   Time: 1354-1447 OT Time Calculation (min): 53 min Charges:  OT General Charges $OT Visit: 1 Procedure OT Evaluation $OT Eval Moderate Complexity: 1 Procedure OT Treatments $Self Care/Home Management : 8-22 mins G-Codes: OT G-codes **NOT FOR INPATIENT CLASS** Functional Limitation: Self care Self Care Current Status (Z6109): At least 20 percent but less than 40 percent impaired, limited or restricted Self Care Goal Status (U0454): At least 1 percent but less than 20 percent impaired, limited or restricted  Aubrey Voong M 02/28/2016, 6:01 PM

## 2016-02-28 NOTE — Care Management Note (Signed)
Case Management Note  Patient Details  Name: Beryle BeamsRadomir Rappa MRN: [id number] Date of Birth: 08/19/1927  Subjective/Objective:   Admitted with CHF                 Action/Plan: Patient resides in a nursing facility and plans to return at discharge. CM will continue to follow for DCP.  Expected Discharge Date:   possibly 03/02/2016               Expected Discharge Plan:  Skilled Nursing Facility  In-House Referral:  Clinical Social Work  Discharge planning Services  CM Consult   Status of Service:  In process, will continue to follow  Reola MosherChandler, Lakeith Careaga L, RN,MHA,BSN [phone] 02/28/2016, 10:19 AM

## 2016-02-28 NOTE — Progress Notes (Signed)
PT Cancellation Note  Patient Details Name: Harold BeamsRadomir Jones MRN: [id number] DOB: 03/02/1928   Cancelled Treatment:    Reason Eval/Treat Not Completed: Patient not medically ready. Pt's Troponin elevated from .28 to .42.  Per department protocol, Troponin values need to be trending downward before initiating PT.  Will check back.   Kimberlea Schlag LUBECK 02/28/2016, 8:58 AM

## 2016-02-28 NOTE — Progress Notes (Signed)
   02/28/16 0900  SLP G-Codes **NOT FOR INPATIENT CLASS**  Functional Assessment Tool Used (clinical judgement)  Functional Limitations Swallowing  Swallow Current Status (Q6578(G8996) CJ  Swallow Goal Status (I6962(G8997) CJ  SLP Evaluations  $ SLP Speech Visit 1 Procedure  SLP Evaluations  $BSS Swallow 1 Procedure

## 2016-02-28 NOTE — Clinical Social Work Note (Signed)
[facility] an [facility] resident. CSW attempted assessment with patient with [place] interpreter over the phone. Patient has difficulty hearing and deferred discharge planning to family, specifically naming his granddaughter. CSW called her and left voicemail with contact information.   Prior to admission, patient was able to walk with a cane which was noted in an ED note. PT notified per request.  Charlynn CourtSarah Aneesa Romey, CSW [phone]

## 2016-02-28 NOTE — Progress Notes (Signed)
CRITICAL VALUE ALERT  Critical value received:  Troponin 0.28  Date of notification:  02/27/16  Time of notification:  2300  Critical value read back:Yes.    Nurse who received alert:  Leanor KailAmor Breeann Reposa  MD notified (1st page):  Donnamarie PoagK. Kirby  Time of first page:  2320  MD notified (2nd page):  Time of second page:  Responding MD:  Donnamarie PoagK. Kirby  Time MD responded:  1610:  2320

## 2016-02-28 NOTE — Evaluation (Signed)
Clinical/Bedside Swallow Evaluation Patient Details  Name: Harold BeamsRadomir Jones MRN: [id number] Date of Birth: 10/22/1927  Today's Date: 02/28/2016 Time: SLP Start Time (ACUTE ONLY): 0900 SLP Stop Time (ACUTE ONLY): 0916 SLP Time Calculation (min) (ACUTE ONLY): 16 min  Past Medical History:  Past Medical History:  Diagnosis Date  . Aortic valve disorder   . BPH without obstruction/lower urinary tract symptoms   . Chest wall pain following surgery   . COGNITIVE DEFICITS DUE CEREBROVASCULAR DISEASE 09/09/2009   Qualifier: Diagnosis of  By: Delrae AlfredMulberry MD, Lanora ManisElizabeth    . CORONARY ARTERY DISEASE 02/27/2007   Qualifier: Diagnosis of  By: Barbaraann Barthelankins MD, [place]    . CVA 02/28/2004   Annotation: embolic Qualifier: Diagnosis of  By: Barbaraann Barthelankins MD, [place]    . Dementia    [facility]  . Diabetes mellitus   . DYSLIPIDEMIA 02/27/2007   Qualifier: Diagnosis of  By: Barbaraann Barthelankins MD, [place]    . FIBRILLATION, ATRIAL 08/31/2002   Qualifier: Diagnosis of  By: Barbaraann Barthelankins MD, [place]    . Grief   . Hypertension   . Neuropathic pain   . Osteoarthritis of left knee   . Physical deconditioning   . Symptomatic bradycardia 08/16/15   [employer] PPM, Dr. Ladona Ridgelaylor   Past Surgical History:  Past Surgical History:  Procedure Laterality Date  . CARDIAC SURGERY     bioprosthetic AV replacement  . EP IMPLANTABLE DEVICE N/A 08/16/2015   [facility], Dr. Ladona Ridgelaylor  . ESOPHAGOGASTRODUODENOSCOPY (EGD) WITH PROPOFOL N/A 01/17/2016   Procedure: ESOPHAGOGASTRODUODENOSCOPY (EGD) WITH PROPOFOL;  Surgeon: Bernette Redbirdobert Buccini, MD;  [facility];  Service: Endoscopy;  Laterality: N/A;   HPI:  Harold Jovanovicis a 82 y.o.malewith dementia, atrial fibrillation with symptomatic bradycardia s/p PPM/ICD insertion, GIB requiring blood transfusion and therefore no longer on anticoagulation, CAD, s/p bioprosthetic aortic valve replacement, diabetes mellitus type 2 [facility]. Admitted with  abdominal pain, vomiting. Found to have Acute hypoxic respiratory failurelikely due to acute on chronic diastolic heart failureversus aspiration pneumonia or less likely,HCAP. MD reports pt WBC is double baseline and infections suspected, though CXR looks like intersitial edema. Pt has a history of dysphagia and has had multiple MBS and clinical swallow evals over the past year. MBS (3, last in 12/2015) consistently show sensed and silent aspriation of thin liquids and pt has been recommended to consume nectar, though taking small sips reduces risk. Granddaughter over the phone reports she does not think pt has been on a modified diet at his assisted living, but her family wants thickened liquids if they are necessary.    Assessment / Plan / Recommendation Clinical Impression  Pt demonstrates minimal clinical signs of aspiration, including slightly wet vocal quality and delayed, possibly unrelated cough. Pt takes small single sips, which is a strategy that has been recommended in the past. Pt does have consistent aspiration of thin liquids on objective swallow tests. Over three MBS have recommended pt consume nectar thick liquids, though he consumed thin PTA. It is difficult to determine if this dysphagia contributes to pts current infection given dx of CHF, possible HCAP or aspiration of vomit. Given that the pt is acutely ill and family desires more conservative approach, recommend resuming most recent MBS recommendations to reduce risk. Will discuss progression with family.     Aspiration Risk  Moderate aspiration risk    Diet Recommendation Dysphagia 2 (Fine chop);Nectar-thick liquid   Liquid Administration via: Cup Medication Administration: Whole meds with liquid Supervision:  Patient able to self feed Compensations: Slow rate;Small sips/bites Postural Changes: Seated upright at 90 degrees    Other  Recommendations Oral Care Recommendations: Oral care BID Other Recommendations: Order  thickener from pharmacy   Follow up Recommendations  24 hour supervision/assistance    Frequency and Duration min 2x/week  2 weeks       Prognosis Prognosis for Safe Diet Advancement: Fair      Swallow Study   General HPI: Harold Jovanovicis a 82 y.o.malewith dementia, atrial fibrillation with symptomatic bradycardia s/p PPM/ICD insertion, GIB requiring blood transfusion and therefore no longer on anticoagulation, CAD, s/p bioprosthetic aortic valve replacement, diabetes mellitus type 2 who resides at [facility]. Admitted with abdominal pain, vomiting. Found to have Acute hypoxic respiratory failurelikely due to acute on chronic diastolic heart failureversus aspiration pneumonia or less likely,HCAP. MD reports pt WBC is double baseline and infections suspected, though CXR looks like intersitial edema. Pt has a history of dysphagia and has had multiple MBS and clinical swallow evals over the past year. MBS (3, last in 12/2015) consistently show sensed and silent aspriation of thin liquids and pt has been recommended to consume nectar, though taking small sips reduces risk. Granddaughter over the phone reports she does not think pt has been on a modified diet at his assisted living, but her family wants thickened liquids if they are necessary.  Type of Study: Bedside Swallow Evaluation Previous Swallow Assessment: see HPI Diet Prior to this Study: Dysphagia 2 (chopped);Thin liquids Temperature Spikes Noted: No Respiratory Status: Room air History of Recent Intubation: No Behavior/Cognition: Alert;Cooperative;Pleasant mood Oral Cavity Assessment: Within Functional Limits Oral Care Completed by SLP: No Vision: Functional for self-feeding Self-Feeding Abilities: Able to feed self Patient Positioning: Upright in bed Baseline Vocal Quality: Normal Volitional Cough: Strong Volitional Swallow: Able to elicit    Oral/Motor/Sensory Function Overall Oral Motor/Sensory Function: Within functional  limits   Ice Chips Ice chips: Not tested   Thin Liquid Thin Liquid: Impaired Presentation: Cup;Self Fed Pharyngeal  Phase Impairments: Wet Vocal Quality;Cough - Delayed    Nectar Thick Nectar Thick Liquid: Not tested   Honey Thick Honey Thick Liquid: Not tested   Puree Puree: Not tested   Solid   GO   Solid: Not tested       Harlon Ditty, MA CCC-SLP [phone]  Shilpa Bushee, Riley Nearing 02/28/2016,9:58 AM

## 2016-02-28 NOTE — Evaluation (Signed)
Physical Therapy Evaluation Patient Details Name: Harold Jones MRN: [id number] DOB: Oct 31, 1927 Today's Date: 02/28/2016   History of Present Illness  Harold Jones is a 82 y.o. male with dementia, atrial fibrillation with symptomatic bradycardia s/p PPM/ICD insertion, GIB requiring blood transfusion and therefore no longer on anticoagulation, CAD, s/p bioprosthetic aortic valve replacement, diabetes mellitus type 2 who resides at ALF. Per ALF RN, p he was in a common area when he suddenly grasped his chest and ran into the bathroom where he vomited "lime green stuff".  Afterwards, he was pale and clammy.    Clinical Impression  Pt admitted with above diagnosis. Pt currently with functional limitations due to the deficits listed below (see PT Problem List). Pt ambulated in hallway with min/guard with RW.  Through use of interpreter, pt states he likes his ALF and was walking with cane there due to L knee pain.  Recommend returning to ALF with HHPT and RW.  Pt will benefit from skilled PT to increase their independence and safety with mobility to allow discharge to the venue listed below.  Pt very appreciative of PT/OT visit.     Follow Up Recommendations Home health PT;Supervision - Intermittent    Equipment Recommendations  Rolling walker with 5" wheels    Recommendations for Other Services       Precautions / Restrictions Precautions Precautions: Fall Restrictions Weight Bearing Restrictions: No      Mobility  Bed Mobility Overal bed mobility: Modified Independent                Transfers Overall transfer level: Needs assistance Equipment used: Rolling walker (2 wheeled) Transfers: Sit to/from Stand Sit to Stand: Min guard         General transfer comment: min/guard for safety  Ambulation/Gait Ambulation/Gait assistance: Min guard Ambulation Distance (Feet): 200 Feet Assistive device: Rolling walker (2 wheeled) Gait Pattern/deviations: Decreased step  length - right;Decreased stance time - left     General Gait Details: Pt with no LOB while ambulating with RW.  He would stop while speaking to interpreter on speaker phone.  Some c/o L knee pain, but did not appear to limit gait distance  Stairs            Wheelchair Mobility    Modified Rankin (Stroke Patients Only)       Balance Overall balance assessment: Needs assistance         Standing balance support: During functional activity Standing balance-Leahy Scale: Fair Standing balance comment: slight posterior tendency while doing tasks at sink as he fatigued                             Pertinent Vitals/Pain Pain Assessment: Faces Faces Pain Scale: Hurts even more Pain Location: L knee with gait Pain Descriptors / Indicators: Grimacing Pain Intervention(s): Limited activity within patient's tolerance;Monitored during session;Repositioned    Home Living Family/patient expects to be discharged to:: Assisted living               Home Equipment: Gilmer Mor - single point Additional Comments: Pt has been in [place] ~ 5 years.  Loved to dance and worked as a Museum/gallery exhibitions officer.  Was widowed ~ 6 months ago.    Prior Function Level of Independence: Independent with assistive device(s)         Comments: Amb with cane     Hand Dominance        Extremity/Trunk Assessment   Upper  Extremity Assessment: Defer to OT evaluation           Lower Extremity Assessment: Overall WFL for tasks assessed         Communication   Communication: Interpreter utilized;Prefers language other than English  Cognition   Behavior During Therapy: [facility] for tasks assessed/performed Overall Cognitive Status: No family/caregiver present to determine baseline cognitive functioning                      General Comments General comments (skin integrity, edema, etc.): Pt very animated when speaking with interpreter and very appreciative of hospital staff.  He also  states he is happy at his ALF.    Exercises        Assessment/Plan    PT Assessment Patient needs continued PT services  PT Diagnosis Difficulty walking   PT Problem List Decreased strength;Decreased activity tolerance;Decreased balance;Decreased knowledge of use of DME  PT Treatment Interventions DME instruction;Gait training;Functional mobility training;Balance training;Therapeutic exercise;Therapeutic activities;Patient/family education   PT Goals (Current goals can be found in the Care Plan section) Acute Rehab PT Goals Patient Stated Goal: Return to his ALF PT Goal Formulation: With patient Time For Goal Achievement: 03/06/16 Potential to Achieve Goals: Good    Frequency Min 3X/week   Barriers to discharge        Co-evaluation PT/OT/SLP Co-Evaluation/Treatment: Yes Reason for Co-Treatment: Necessary to address cognition/behavior during functional activity;For patient/therapist safety PT goals addressed during session: Mobility/safety with mobility;Proper use of DME         End of Session Equipment Utilized During Treatment: Gait belt Activity Tolerance: Patient tolerated treatment well Patient left: in chair;with call bell/phone within reach;with chair alarm set Nurse Communication: Mobility status    Functional Assessment Tool Used: clinical judgement and objective findings Functional Limitation: Mobility: Walking and moving around Mobility: Walking and Moving Around Current Status (W0981(G8978): At least 1 percent but less than 20 percent impaired, limited or restricted Mobility: Walking and Moving Around Goal Status [phone]([id number]): At least 1 percent but less than 20 percent impaired, limited or restricted    Time: 1354-1438 PT Time Calculation (min) (ACUTE ONLY): 44 min   Charges:   PT Evaluation $PT Eval Low Complexity: 1 Procedure     PT G Codes:   PT G-Codes **NOT FOR INPATIENT CLASS** Functional Assessment Tool Used: clinical judgement and objective  findings Functional Limitation: Mobility: Walking and moving around Mobility: Walking and Moving Around Current Status (W2956(G8978): At least 1 percent but less than 20 percent impaired, limited or restricted Mobility: Walking and Moving Around Goal Status [phone](G8979): At least 1 percent but less than 20 percent impaired, limited or restricted    [facility],Rutilio Yellowhair LUBECK 02/28/2016, 3:04 PM

## 2016-02-28 NOTE — Clinical Social Work Note (Signed)
Clinical Social Work Assessment  Patient Details  Name: Harold Jones MRN: [id number] Date of Birth: 1928/03/30  Date of referral:  02/28/16               Reason for consult:  Discharge Planning                Permission sought to share information with:  Facility Sport and exercise psychologist, Family Supports Permission granted to share information::  Yes, Verbal Permission Granted  Name::     [name]  Agency::  [facility]  Relationship::  Granddaughter  Contact Information:  [phone]  Housing/Transportation Living arrangements for the past 2 months:  [place] of Information:  Patient, Medical Team, Other (Comment Required) (Granddaughter) Patient Interpreter Needed:  Other (Comment Required) ([place]) Criminal Activity/Legal Involvement Pertinent to Current Situation/Hospitalization:  No - Comment as needed Significant Relationships:  Adult Children, Other Family Members Lives with:  Facility Resident Do you feel safe going back to the place where you live?  Yes Need for family participation in patient care:  Yes (Comment)  Care giving concerns:  Patient is from [facility]F.   Social Worker assessment / plan:  CSW met with patient. No supports at bedside. CSW utilized [place] interpreter via telephone but patient has hearing issues that inhibited this interaction. Patient gave verbal permission to defer discharge planning to his granddaughter [name]. CSW called and spoke with her over the phone. No interpreter needed. Patient's granddaughter confirmed that patient is from [facility]F and the plan is to return. The need for PTAR will depend on how well patient does in PT today. Will discuss on the day of discharge. No further concerns. CSW encouraged patient and his granddaughter to contact CSW as needed. CSW will continue to follow patient for support and facilitate discharge back to ALF once medically stable.  Employment status:   Retired Forensic scientist:  Medicare PT Recommendations:  Not assessed at this time [place] / Referral to community resources:  Other (Comment Required) (Plans to return to ALF.)  Patient/Family's Response to care:  Patient deferred discharge planning to his granddaughter due to language barriers. Patient's granddaughter agreeable to return to ALF. Patient's family supportive and involved in patient's care. Patient and his granddaughter appreciated social work intervention.  Patient/Family's Understanding of and Emotional Response to Diagnosis, Current Treatment, and Prognosis:  Patient and his granddaughter understand that he will return to ALF once discharged. Patient and his granddaughter appear happy with hospital care.  Emotional Assessment Appearance:  Appears stated age Attitude/Demeanor/Rapport:  Other (Pleasant) Affect (typically observed):  Accepting, Appropriate, Calm, Pleasant Orientation:  Oriented to Self, Oriented to Place, Oriented to  Time, Oriented to Situation Alcohol / Substance use:  Never Used Psych involvement (Current and /or in the community):  No (Comment)  Discharge Needs  Concerns to be addressed:  Care Coordination Readmission within the last 30 days:  No Current discharge risk:  None Barriers to Discharge:  No Barriers Identified   Candie Chroman, LCSW 02/28/2016, 2:15 PM

## 2016-02-28 NOTE — Progress Notes (Signed)
PROGRESS NOTE        PATIENT DETAILS Name: Harold Jones Age: 82 y.o. Sex: male Date of Birth: Jun 04, 1928 Admit Date: 02/27/2016 Admitting Physician Renae Fickle, MD PCP:No primary care provider on file.  Brief Narrative: Patient is a 82 y.o. male dementia, atrial fibrillation with symptomatic bradycardia s/p PPM/ICD insertion, GIB requiring blood transfusion and therefore no longer on anticoagulation, CAD, s/p bioprosthetic aortic valve replacement, diabetes mellitus type who presented to the hospital on 8/28, after an episode of chest pain along with vomiting.  Subjective: Lying comfortably in bed-shakes head no when asked if he has chest pain.  Assessment/Plan: Active Problems: Chest pain: Currently chest pain-free, troponins elevated- apparently has history of CAD-but has had recent GI bleeding requiring PRBC transfusion-has dementia and poor functional status, may not be a good candidate for further workup. But we will get a cardiology opinion. Continue aspirin and statin  ? Aspiration pneumonia: Chest x-ray on 8/28 and subsequent repeat chest x-ray on 8/29 without any pneumonia findings. Very minimal leukocytosis, afebrile-speech therapy evaluation completed-does not appear to have significant change in his chronic underlying dysphagia-hence at this time I will discontinue antibiotics and monitor him off antibiotics. Blood cultures ending.  Acute kidney injury on chronic kidney disease stage III: Creatinine downtrending with supportive care. Continue to hold lisinopril. Follow electrolytes.  History of permanent atrial fibrillation: No longer on anticoagulation given recent GI bleeding-CHADS2Vasc is at least 6. Continue aspirin. Reviewed prior cardiology note.  History of symptomatic bradycardia: PPM in place,follows with Dr. Ladona Ridgel  Type 2 diabetes: CBGs stable with SSI, resume glipizide on discharge  Recent GI bleeding: Continue PPI-recent  endoscopy did not show any obvious foci of bleeding.  History of aortic valve replacement with bioprosthetic valve  Prior history of CVA: Nonfocal exam-continue low-dose aspirin-as anticoagulation discontinued last admission due to GI bleeding.   Dysphagia: Discussed with speech therapist-chronic issue-no significant change in his usual baseline. Continue dysphagia 2 diet.  BPH: Continue Flomax  Dementia: Currently awake and alert-supportive care  DVT Prophylaxis: Prophylactic Lovenox   Code Status:  DNR  Family Communication: None at bedside-spoke with patient's granddaughter [name] over the phone  Disposition Plan: Remain inpatient-but will plan SNF on discharge-perhaps on 8/30  Antimicrobial agents: IV Zosyn 8/28>>8/29  Procedures: None  CONSULTS:  cardiology  Time spent: 25 minutes-Greater than 50% of this time was spent in counseling, explanation of diagnosis, planning of further management, and coordination of care.  MEDICATIONS: Anti-infectives    Start     Dose/Rate Route Frequency Ordered Stop   02/27/16 2100  piperacillin-tazobactam (ZOSYN) IVPB 3.375 g     3.375 g 12.5 mL/hr over 240 Minutes Intravenous Every 8 hours 02/27/16 2022     02/27/16 2100  vancomycin (VANCOCIN) 1,250 mg in sodium chloride 0.9 % 250 mL IVPB     1,250 mg 166.7 mL/hr over 90 Minutes Intravenous  Once 02/27/16 2022 02/28/16 0149      Scheduled Meds: . aspirin EC  81 mg Oral Daily  . atorvastatin  10 mg Oral Daily  . docusate sodium  100 mg Oral BID  . enoxaparin (LOVENOX) injection  30 mg Subcutaneous Q24H  . fluticasone  2 spray Each Nare Daily  . gabapentin  300 mg Oral BID  . insulin aspart  0-9 Units Subcutaneous TID WC  . pantoprazole  40 mg Oral Daily  .  piperacillin-tazobactam (ZOSYN)  IV  3.375 g Intravenous Q8H  . polyethylene glycol  17 g Oral Daily  . sodium chloride flush  3 mL Intravenous Q12H  . tamsulosin  0.4 mg Oral q morning - 10a   Continuous  Infusions:  PRN Meds:.sodium chloride, acetaminophen, ondansetron (ZOFRAN) IV, RESOURCE THICKENUP CLEAR, sodium chloride flush   PHYSICAL EXAM: Vital signs: Vitals:   02/27/16 2012 02/27/16 2300 02/27/16 2340 02/28/16 0419  BP:  115/66  (!) 126/57  Pulse:  (!) 53  (!) 53  Resp:  16  16  Temp:  97.4 F (36.3 C)  97.7 F (36.5 C)  TempSrc:  Oral  Oral  SpO2:  98%  98%  Weight: 87.2 kg (192 lb 3.2 oz)  84.3 kg (185 lb 12.8 oz) 83.2 kg (183 lb 6.4 oz)  Height: 6' (1.829 m)  6' (1.829 m)    Filed Weights   02/27/16 2012 02/27/16 2340 02/28/16 0419  Weight: 87.2 kg (192 lb 3.2 oz) 84.3 kg (185 lb 12.8 oz) 83.2 kg (183 lb 6.4 oz)   Body mass index is 24.87 kg/m.   Gen Exam: Awake and alert with clear speech. Not in any distress -Sitting up in bed and eating breakfast Neck: Supple, No JVD.   Chest: B/L Clear.   CVS: S1 S2 Regular, 2/6 systolic murmur Abdomen: soft, BS +, non tender, non distended.  Extremities: no edema, lower extremities warm to touch. Neurologic: Non Focal-generalized weakness Skin: No Rash or lesions   Wounds: N/A.   I have personally reviewed following labs and imaging studies  LABORATORY DATA: CBC:  Recent Labs Lab 02/27/16 1314  WBC 11.4*  NEUTROABS 9.2*  HGB 10.0*  HCT 32.8*  MCV 81.2  PLT 175    Basic Metabolic Panel:  Recent Labs Lab 02/27/16 1314 02/28/16 0257  NA 138 139  K 4.5 4.6  CL 108 105  CO2 20* 26  GLUCOSE 188* 152*  BUN 30* 27*  CREATININE 1.88* 1.70*  CALCIUM 9.2 9.3    GFR: Estimated Creatinine Clearance: 34.2 mL/min (by C-G formula based on SCr of 1.7 mg/dL).  Liver Function Tests:  Recent Labs Lab 02/27/16 1314  AST 16  ALT 12*  ALKPHOS 38  BILITOT 0.8  PROT 7.2  ALBUMIN 3.7    Recent Labs Lab 02/27/16 1314  LIPASE 18   No results for input(s): AMMONIA in the last 168 hours.  Coagulation Profile: No results for input(s): INR, PROTIME in the last 168 hours.  Cardiac Enzymes:  Recent  Labs Lab 02/27/16 2057 02/28/16 0257 02/28/16 0836  TROPONINI 0.28* 0.42* 0.29*    BNP (last 3 results) No results for input(s): PROBNP in the last 8760 hours.  HbA1C: No results for input(s): HGBA1C in the last 72 hours.  CBG:  Recent Labs Lab 02/27/16 1317 02/27/16 2026 02/28/16 0638 02/28/16 0658  GLUCAP 171* 155* 143* 143*    Lipid Profile: No results for input(s): CHOL, HDL, LDLCALC, TRIG, CHOLHDL, LDLDIRECT in the last 72 hours.  Thyroid Function Tests:  Recent Labs  02/27/16 2057  TSH 0.227*    Anemia Panel: No results for input(s): VITAMINB12, FOLATE, FERRITIN, TIBC, IRON, RETICCTPCT in the last 72 hours.  Urine analysis:    Component Value Date/Time   COLORURINE YELLOW 02/27/2016 1534   APPEARANCEUR CLEAR 02/27/2016 1534   LABSPEC 1.018 02/27/2016 1534   PHURINE 5.5 02/27/2016 1534   GLUCOSEU NEGATIVE 02/27/2016 1534   HGBUR TRACE (A) 02/27/2016 1534   BILIRUBINUR NEGATIVE 02/27/2016  1534   KETONESUR NEGATIVE 02/27/2016 1534   PROTEINUR 30 (A) 02/27/2016 1534   UROBILINOGEN 0.2 03/01/2015 1940   NITRITE NEGATIVE 02/27/2016 1534   LEUKOCYTESUR NEGATIVE 02/27/2016 1534    Sepsis Labs: Lactic Acid, Venous    Component Value Date/Time   LATICACIDVEN 1.1 02/27/2016 1840    MICROBIOLOGY: Recent Results (from the past 240 hour(s))  MRSA PCR Screening     Status: None   Collection Time: 02/27/16  8:17 PM  Result Value Ref Range Status   MRSA by PCR NEGATIVE NEGATIVE Final    Comment:        The GeneXpert MRSA Assay (FDA approved for NASAL specimens only), is one component of a comprehensive MRSA colonization surveillance program. It is not intended to diagnose MRSA infection nor to guide or monitor treatment for MRSA infections.     RADIOLOGY STUDIES/RESULTS: Dg Chest 2 View  Result Date: 02/27/2016 CLINICAL DATA:  Chest pain with nausea and vomiting. EXAM: CHEST  2 VIEW COMPARISON:  01/16/2016 hand 08/17/2015 FINDINGS: There is  slight pulmonary vascular prominence with slight interstitial edema at the lung bases. Slight chronic cardiomegaly. Pacemaker in place. No acute bone abnormality. No definitive effusions. Aortic valve replacement. IMPRESSION: Slight interstitial pulmonary edema. Electronically Signed   By: Francene Boyers M.D.   On: 02/27/2016 15:08   Dg Chest Port 1 View  Result Date: 02/28/2016 CLINICAL DATA:  Heart failure. EXAM: PORTABLE CHEST 1 VIEW COMPARISON:  02/27/2016,, 03/01/2015 11/12/2013, 06/23/2013, 10/08/2011. FINDINGS: Cardiac pacer with lead tip over the right ventricle. Prior cardiac valve replacement. Cardiomegaly with mild diffuse interstitial prominence suggesting a mild component of congestive heart failure. Underlying chronic interstitial lung disease cannot be excluded . No interim change. No pleural effusion or pneumothorax . IMPRESSION: Cardiac pacer noted with lead tip over the right ventricle. No pneumothorax. 2. Prior cardiac valve replacement. Cardiomegaly with mild bilateral from interstitial prominence suggesting a mild component of congestive heart failure. Underlying chronic interstitial lung disease cannot be excluded . Electronically Signed   By: Maisie Fus  Register   On: 02/28/2016 07:17   Dg Abd Portable 1v  Result Date: 02/27/2016 CLINICAL DATA:  Chest pain and emesis.  Dementia.  Abdominal pain. EXAM: PORTABLE ABDOMEN - 1 VIEW COMPARISON:  CT 01/12/2016 FINDINGS: Single portable view abdomen and pelvis. Non-obstructive bowel gas pattern. Calcification projecting over the right kidney is likely vascular, when compared to the prior motion degraded CT. No other abnormal abdominal calcifications. Probable phleboliths in the pelvis. Right hip osteoarthritis. S shaped lumbar spine curvature with lumbar spondylosis. IMPRESSION: No acute findings. Electronically Signed   By: Jeronimo Greaves M.D.   On: 02/27/2016 17:06     LOS: 0 days   Jeoffrey Massed, MD  [facility] Pager:[phone]  [phone]  If 7PM-7AM, please contact night-coverage [email or web address] Password [facility] 02/28/2016, 10:25 AM

## 2016-02-28 NOTE — NC FL2 (Signed)
[place] MEDICAID FL2 LEVEL OF CARE SCREENING TOOL     IDENTIFICATION  Patient Name: Harold Jones Birthdate: 10/24/1927 Sex: male Admission Date (Current Location): 02/27/2016  [facility] and [place] Number:  Producer, television/film/video and Address:  [facility], [address], [place], [place] [address]      Provider Number: [id number]  Attending Physician Name and Address:  Maretta Bees, MD  Relative Name and Phone Number:       Current Level of Care: Hospital Recommended Level of Care: Assisted Living Facility (with HHPT) Prior Approval Number:    Date Approved/Denied:   PASRR Number:    Discharge Plan:  (ALF with HHPT)    Current Diagnoses: Patient Active Problem List   Diagnosis Date Noted  . CHF exacerbation (HCC) 02/27/2016  . Altered mental state   . Confusion   . Acute GI bleeding   . GI bleed 01/12/2016  . GIB (gastrointestinal bleeding) 01/12/2016  . Symptomatic bradycardia 08/15/2015  . Intraventricular conduction delay 08/15/2015  . S/P aortic valve replacement with bioprosthetic valve 08/15/2015  . Atrial fibrillation with slow ventricular response (HCC) 08/15/2015  . Severe sepsis (HCC) 05/30/2015  . Acute respiratory failure (HCC) 05/30/2015  . History of stroke 03/15/2015  . Benign hypertensive heart disease without heart failure 03/08/2015  . 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 BPH without obstruction/lower urinary tract symptoms 03/08/2015  . Post herpetic neuralgia 03/08/2015  . CAP (community acquired pneumonia) 03/01/2015  . Syncope and collapse 11/12/2013  . Nasal fracture 11/12/2013  . Encephalopathy acute 06/23/2013  . Diabetes mellitus, type 2 (HCC) 06/23/2013  . Hypoglycemia 06/23/2013  . Supratherapeutic INR 06/23/2013  . Zoster 10/12/2011  . Altered mental status 10/08/2011  . GLAUCOMA 09/08/2010  . KNEE PAIN, LEFT 11/22/2009  . Cognitive deficits as late  effect of cerebrovascular disease 09/09/2009  . CAROTID BRUIT, RIGHT 01/18/2009  . GASTROINTESTINAL HEMORRHAGE 09/26/2007  . OSTEOARTHRITIS 03/18/2007  . Dyslipidemia 02/27/2007  . Coronary atherosclerosis 02/27/2007  . Essential hypertension 02/25/2007  . Cerebral artery occlusion with cerebral infarction (HCC) 02/28/2004  . ASCENDING AORTIC ANEURYSM 02/28/2004  . H/O aortic valve replacement 02/28/2004  . FIBRILLATION, ATRIAL 08/31/2002    Orientation RESPIRATION BLADDER Height & Weight     Self, Time, Situation, Place  O2 (Nasal Canula 3 L) Continent Weight: 183 lb 6.4 oz (83.2 kg) Height:  6' (182.9 cm)  BEHAVIORAL SYMPTOMS/MOOD NEUROLOGICAL BOWEL NUTRITION STATUS   (None)  (History of stroke) Continent Diet (DYS 2. Fluid nectar thick.)  AMBULATORY STATUS COMMUNICATION OF NEEDS Skin   Limited Assist Verbally Normal                       Personal Care Assistance Level of Assistance  Bathing, Feeding, Dressing Bathing Assistance: Limited assistance Feeding assistance: Independent Dressing Assistance: Limited assistance     Functional Limitations Info  Sight, Hearing, Speech Sight Info: Adequate Hearing Info: Impaired Speech Info: Adequate    SPECIAL CARE FACTORS FREQUENCY  PT (By licensed PT), OT (By licensed OT), Blood pressure, Diabetic urine testing     PT Frequency: 3 x week OT Frequency: 3 x week            Contractures Contractures Info: Not present    Additional Factors Info  Code Status, Allergies Code Status Info: DNR Allergies Info: NKDA           Current Medications (02/28/2016):  This is the  current hospital active medication list Current Facility-Administered Medications  Medication Dose Route Frequency Provider Last Rate Last Dose  . 0.9 %  sodium chloride infusion  250 mL Intravenous PRN Renae FickleMackenzie Short, MD      . acetaminophen (TYLENOL) tablet 650 mg  650 mg Oral Q4H PRN Renae FickleMackenzie Short, MD      . aspirin EC tablet 81 mg  81 mg  Oral Daily Renae FickleMackenzie Short, MD   81 mg at 02/28/16 0939  . atorvastatin (LIPITOR) tablet 10 mg  10 mg Oral Daily Renae FickleMackenzie Short, MD   10 mg at 02/28/16 0939  . docusate sodium (COLACE) capsule 100 mg  100 mg Oral BID Renae FickleMackenzie Short, MD   100 mg at 02/28/16 [id number]  . enoxaparin (LOVENOX) injection 30 mg  30 mg Subcutaneous Q24H Renae FickleMackenzie Short, MD   30 mg at 02/27/16 2257  . fluticasone (FLONASE) 50 MCG/ACT nasal spray 2 spray  2 spray Each Nare Daily Renae FickleMackenzie Short, MD   2 spray at 02/28/16 0940  . gabapentin (NEURONTIN) capsule 300 mg  300 mg Oral BID Renae FickleMackenzie Short, MD   300 mg at 02/28/16 [id number]  . insulin aspart (novoLOG) injection 0-9 Units  0-9 Units Subcutaneous TID WC Renae FickleMackenzie Short, MD   2 Units at 02/28/16 1212  . ondansetron (ZOFRAN) injection 4 mg  4 mg Intravenous Q6H PRN Renae FickleMackenzie Short, MD      . pantoprazole (PROTONIX) EC tablet 40 mg  40 mg Oral Daily Renae FickleMackenzie Short, MD   40 mg at 02/28/16 0939  . polyethylene glycol (MIRALAX / GLYCOLAX) packet 17 g  17 g Oral Daily Renae FickleMackenzie Short, MD   17 g at 02/28/16 0737  . RESOURCE THICKENUP CLEAR   Oral PRN Maretta BeesShanker M Ghimire, MD      . sodium chloride flush (NS) 0.9 % injection 3 mL  3 mL Intravenous Q12H Renae FickleMackenzie Short, MD   3 mL at 02/28/16 0939  . sodium chloride flush (NS) 0.9 % injection 3 mL  3 mL Intravenous PRN Renae FickleMackenzie Short, MD      . tamsulosin (FLOMAX) capsule 0.4 mg  0.4 mg Oral q morning - 10a Renae FickleMackenzie Short, MD   0.4 mg at 02/28/16 [id number]     Discharge Medications: Please see discharge summary for a list of discharge medications.  Relevant Imaging Results:  Relevant Lab Results:   Additional Information SS#: [id number]  Margarito LinerSarah C Rynlee Lisbon, LCSW

## 2016-02-29 DIAGNOSIS — R0789 Other chest pain: Secondary | ICD-10-CM | POA: Diagnosis not present

## 2016-02-29 DIAGNOSIS — I5033 Acute on chronic diastolic (congestive) heart failure: Secondary | ICD-10-CM | POA: Diagnosis not present

## 2016-02-29 DIAGNOSIS — I5023 Acute on chronic systolic (congestive) heart failure: Secondary | ICD-10-CM

## 2016-02-29 DIAGNOSIS — E1100 Type 2 diabetes mellitus with hyperosmolarity without nonketotic hyperglycemic-hyperosmolar coma (NKHHC): Secondary | ICD-10-CM

## 2016-02-29 DIAGNOSIS — R7989 Other specified abnormal findings of blood chemistry: Secondary | ICD-10-CM

## 2016-02-29 DIAGNOSIS — J9601 Acute respiratory failure with hypoxia: Secondary | ICD-10-CM

## 2016-02-29 DIAGNOSIS — I4891 Unspecified atrial fibrillation: Secondary | ICD-10-CM | POA: Diagnosis not present

## 2016-02-29 LAB — BASIC METABOLIC PANEL
Anion gap: 11 (ref 5–15)
BUN: 42 mg/dL — AB (ref 6–20)
CALCIUM: 9 mg/dL (ref 8.9–10.3)
CHLORIDE: 103 mmol/L (ref 101–111)
CO2: 22 mmol/L (ref 22–32)
CREATININE: 2.61 mg/dL — AB (ref 0.61–1.24)
GFR calc Af Amer: 24 mL/min — ABNORMAL LOW (ref >60)
GFR calc non Af Amer: 21 mL/min — ABNORMAL LOW (ref >60)
GLUCOSE: 234 mg/dL — AB (ref 65–99)
Potassium: 4.4 mmol/L (ref 3.5–5.1)
Sodium: 136 mmol/L (ref 135–145)

## 2016-02-29 LAB — GLUCOSE, CAPILLARY
GLUCOSE-CAPILLARY: 183 mg/dL — AB (ref 65–99)
Glucose-Capillary: 196 mg/dL — ABNORMAL HIGH (ref 65–99)

## 2016-02-29 LAB — URINE CULTURE

## 2016-02-29 MED ORDER — FUROSEMIDE 20 MG PO TABS: 40.0000 mg | ORAL_TABLET | Freq: Every day | ORAL | Status: DC

## 2016-02-29 MED ORDER — ASPIRIN 81 MG PO CHEW: 81.0000 mg | CHEWABLE_TABLET | Freq: Every day | ORAL | Status: DC

## 2016-02-29 MED ORDER — HYDRALAZINE HCL 25 MG PO TABS: 25.0000 mg | ORAL_TABLET | Freq: Two times a day (BID) | ORAL | 0 refills | Status: DC

## 2016-02-29 NOTE — Care Management Obs Status (Signed)
MEDICARE OBSERVATION STATUS NOTIFICATION   Patient Details  Name: Harold Jones MRN: [id number] Date of Birth: 11/11/1927   Medicare Observation Status Notification Given:  Yes (Letter explained by intepereter;[place] from [place]; all questions answered; )    Cherrie Distancehandler, Jamaia Brum L, RN 02/29/2016, 3:57 PM

## 2016-02-29 NOTE — Discharge Summary (Addendum)
PATIENT DETAILS Name: Harold Jones Age: 82 y.o. Sex: male Date of Birth: 08/04/1927 MRN: [id number]. Admitting Physician: Renae FickleMackenzie Short, MD PCP:No primary care provider on file.  Admit Date: 02/27/2016 Discharge date: 02/29/2016  Recommendations for Outpatient Follow-up:  1. Follow up with attending M.D. at ALF in the next 3-5 days. 2. Repeat chemistry panel in the next 3-5 days. 3. Daily weights  4. Please consider follow-up with primary cardiologist in 1 month 5. Consider palliative care evaluation while at assisted living facility.  Admitted From:  ALF   Disposition: ALF with home health PT/OT/SLP   Home Health:  Yes  Equipment/Devices: None  Discharge Condition: Stable  CODE STATUS: DNR  Diet recommendation:  Dysphagia 2 (Fine chop);Nectar-thick liquid  Liquid Administration via: Cup Medication Administration: Whole meds with liquid Supervision: Patient able to self feed Compensations: Slow rate;Small sips/bites Postural Changes: Seated upright at 90 degrees   Brief Summary: See H&P, Labs, Consult and Test reports for all details in brief, Patient is a 82 y.o. male dementia, atrial fibrillation with symptomatic bradycardia s/p PPM/ICD insertion, GIB requiring blood transfusion and therefore no longer on anticoagulation, CAD, s/p bioprosthetic aortic valve replacement, diabetes mellitus type who presented to the hospital on 8/28, after an episode of chest pain along with vomiting.  Brief Hospital Course: Chest pain: Currently chest pain-free, troponins elevated- apparently has history of CAD-but has had recent GI bleeding requiring PRBC transfusion-has dementia and poor functional status, may not be a good candidate for further workup. Cardiology consulted, agree that best managed medically without any further invasive studies. Plans are to continue aspirin and statin on discharge.   ? Aspiration pneumonia: Chest x-ray on 8/28 and subsequent repeat chest  x-ray on 8/29 without any pneumonia findings. Very minimal leukocytosis, afebrile-speech therapy evaluation completed-does not appear to have significant change in his chronic underlying dysphagia-although he was started on empiric antibiotics-since it was felt that he did not have pneumonia and this was discontinued on 8/29 and he was monitored off antibiotics. He continues to do well-with no recurrence of his admitting symptoms. hence at this time I will discontinue antibiotics and monitor him off antibiotics. Blood cultures are negative so far.   Possible acute diastolic heart failure (EF 60-65% by 2-D echo on 01/17/16): Given intravenous Lasix on admission, he is clinically compensated. Currently -1.9L negative balance, weight stable at 187 pounds (192 pounds on admission).  Acute kidney injury on chronic kidney disease stage III: Creatinine labile-slight bump in creatinine to 2.61 today, we will hold Lasix today, and resume Lasix dosing on 8/31 he did continue to hold lisinopril for now. Please repeat electrolytes panel in the next 3-5 days.   History of permanent atrial fibrillation: No longer on anticoagulation given recent GI bleeding-CHADS2Vasc is at least 6. Continue aspirin.   History of symptomatic bradycardia: PPM in place,follows with Dr. Ladona Ridgelaylor  Type 2 diabetes: CBGs stable with SSI, resume glipizide on discharge  Recent GI bleeding: Continue PPI-recent endoscopy did not show any obvious foci of bleeding.  History of aortic valve replacement with bioprosthetic valve  Prior history of CVA: Nonfocal exam-continue low-dose aspirin-as anticoagulation discontinued last admission due to GI bleeding.   Dysphagia: Discussed with speech therapist-chronic issue-no significant change in his usual baseline. Continue dysphagia 2 diet. Ensure speech therapy follow-up at SNF. Spoke with her granddaughter Ms Harold BarbanRenic-she is aware of dysphagia issue-and is aware of the risk of aspiration  pneumonitis.  BPH: Continue Flomax  Dementia: Currently awake and alert-supportive care  Palliative  care: Elderly [place] male with multiple medical issues, with recurrent hospitalizations over the past few months-presented with chest pain and elevated troponins. Unfortunately not a candidate for invasive studies, he has recently had a GI bleeding for which anticoagulation has been discontinued permanently. He also has chronic dysphagia, and as at risk for recurrent aspiration pneumonitis and further decompensation. I suggest at some point getting a palliative care evaluation while at his assisted living facility for goals of care consult.Spoke with her granddaughter Ms [name]-over the phone, she is aware of overall poor prognoses.  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   Diabetes mellitus, type 2 (HCC)   Acute respiratory failure (HCC)   Atrial fibrillation with slow ventricular response (HCC)   CHF exacerbation [facility])   Discharge Instructions:  Activity:  As tolerated with Full fall precautions use walker/cane & assistance   Discharge Instructions    (HEART FAILURE PATIENTS) Call MD:  Anytime you have any of the following symptoms: 1) 3 pound weight gain in 24 hours or 5 pounds in 1 week 2) shortness of breath, with or without a dry hacking cough 3) swelling in the hands, feet or stomach 4) if you have to sleep on extra pillows at night in order to breathe.    Complete by:  As directed   Diet - low sodium heart healthy    Complete by:  As directed   Dysphagia 2 (Fine chop);Nectar-thick liquid  Liquid Administration via: Cup Medication Administration: Whole meds with liquid Supervision: Patient able to self feed Compensations: Slow rate;Small sips/bites Postural Changes: Seated upright at 90 degrees   Discharge instructions    Complete by:  As directed   You have  Congestive Heart Failure: Please call your Cardiologist or Primary MD-Anytime you have any of the following symptoms:   1) 3 pound weight gain in 24 hours or 5 pounds in 1 week  2) shortness of breath, with or without a dry hacking cough 3) swelling in the hands, feet or stomach 4) if you have to sleep on extra pillows at night in order to breathe  Follow cardiac low salt diet and encourage 1.5 lit/day fluid restriction.  Follow with Primary MD  No primary care provider on file.  and other consultant's as instructed your Hospitalist MD  Please get a complete blood count and chemistry panel checked by your Primary MD at your next visit, and again as instructed by your Primary MD.  Get Medicines reviewed and adjusted: Please take all your medications with you for your next visit with your Primary MD  Laboratory/radiological data: Please request your Primary MD to go over all hospital tests and procedure/radiological results at the follow up, please ask your Primary MD to get all Hospital records sent to his/her office.  In some cases, they will be blood work, cultures and biopsy results pending at the time of your discharge. Please request that your primary care M.D. follows up on these results.  Also Note the following: If you experience worsening of your admission symptoms, develop shortness of breath, life threatening emergency, suicidal or homicidal thoughts you must seek medical attention immediately by calling 911 or calling your MD immediately  if symptoms less severe.  You must read complete instructions/literature along with all the possible adverse reactions/side effects for all the Medicines you take and that have been prescribed to you. Take any new Medicines after you have completely  understood and accpet all the possible adverse reactions/side effects.   Do not drive when taking Pain medications or sleeping medications (Benzodaizepines)  Do not take more than prescribed Pain,  Sleep and Anxiety Medications. It is not advisable to combine anxiety,sleep and pain medications without talking with your primary care practitioner  Special Instructions: If you have smoked or chewed Tobacco  in the last 2 yrs please stop smoking, stop any regular Alcohol  and or any Recreational drug use.  Wear Seat belts while driving.  Please note: You were cared for by a hospitalist during your hospital stay. Once you are discharged, your primary care physician will handle any further medical issues. Please note that NO REFILLS for any discharge medications will be authorized once you are discharged, as it is imperative that you return to your primary care physician (or establish a relationship with a primary care physician if you do not have one) for your post hospital discharge needs so that they can reassess your need for medications and monitor your lab values.   Increase activity slowly    Complete by:  As directed       Medication List    STOP taking these medications   lisinopril 20 MG tablet Commonly known as:  PRINIVIL,ZESTRIL     TAKE these medications   aspirin 81 MG chewable tablet Chew 1 tablet (81 mg total) by mouth daily. What changed:  how much to take  when to take this  additional instructions   atorvastatin 10 MG tablet Commonly known as:  LIPITOR Take 10 mg by mouth daily.   docusate sodium 100 MG capsule Commonly known as:  COLACE Take 100 mg by mouth 2 (two) times daily.   FLONASE ALLERGY RELIEF 50 MCG/ACT nasal spray Generic drug:  fluticasone Place 2 sprays into both nostrils daily. For 14 days (Start date: 02/22/16)   furosemide 20 MG tablet Commonly known as:  LASIX Take 2 tablets (40 mg total) by mouth daily. Reported on 08/17/2015 Start taking on:  03/01/2016 What changed:  how much to take   gabapentin 300 MG capsule Commonly known as:  NEURONTIN Take 300 mg by mouth 2 (two) times daily.   glipiZIDE 5 MG tablet Commonly known as:   GLUCOTROL Take 5 mg by mouth daily before breakfast.   hydrALAZINE 25 MG tablet Commonly known as:  APRESOLINE Take 1 tablet (25 mg total) by mouth 2 (two) times daily.   MAPAP 325 MG tablet Generic drug:  acetaminophen Take 650 mg by mouth 2 (two) times daily. NOT TO EXCEED 3,000 MG/DAY FROM ALL (COMBINED) SOURCES   pantoprazole 40 MG tablet Commonly known as:  PROTONIX Take 1 tablet (40 mg total) by mouth daily.   polyethylene glycol packet Commonly known as:  MIRALAX / GLYCOLAX Take 17 g by mouth daily. Mix with 8 ounces of liquid/Hold for loose stools   tamsulosin 0.4 MG Caps capsule Commonly known as:  FLOMAX Take 0.4 mg by mouth every morning.   traMADol 50 MG tablet Commonly known as:  ULTRAM Take 50 mg by mouth every 6 (six) hours as needed (FOR KNEE PAIN).      Follow-up Information    Lewayne Bunting, MD. Schedule an appointment as soon as possible for a visit in 1 month(s).   Specialty:  Cardiology Contact information: [address] [place] [address] [phone]        Attending M.D. at SNF. Schedule an appointment as soon as possible for a  visit in 3 day(s).          No Known Allergies   Consultations:   cardiology  Other Procedures/Studies: Dg Chest 2 View  Result Date: 02/27/2016 CLINICAL DATA:  Chest pain with nausea and vomiting. EXAM: CHEST  2 VIEW COMPARISON:  01/16/2016 hand 08/17/2015 FINDINGS: There is slight pulmonary vascular prominence with slight interstitial edema at the lung bases. Slight chronic cardiomegaly. Pacemaker in place. No acute bone abnormality. No definitive effusions. Aortic valve replacement. IMPRESSION: Slight interstitial pulmonary edema. Electronically Signed   By: Francene Boyers M.D.   On: 02/27/2016 15:08   Dg Chest Port 1 View  Result Date: 02/28/2016 CLINICAL DATA:  Heart failure. EXAM: PORTABLE CHEST 1 VIEW COMPARISON:  02/27/2016,, 03/01/2015 11/12/2013, 06/23/2013, 10/08/2011. FINDINGS:  Cardiac pacer with lead tip over the right ventricle. Prior cardiac valve replacement. Cardiomegaly with mild diffuse interstitial prominence suggesting a mild component of congestive heart failure. Underlying chronic interstitial lung disease cannot be excluded . No interim change. No pleural effusion or pneumothorax . IMPRESSION: Cardiac pacer noted with lead tip over the right ventricle. No pneumothorax. 2. Prior cardiac valve replacement. Cardiomegaly with mild bilateral from interstitial prominence suggesting a mild component of congestive heart failure. Underlying chronic interstitial lung disease cannot be excluded . Electronically Signed   By: Maisie Fus  Register   On: 02/28/2016 07:17   Dg Abd Portable 1v  Result Date: 02/27/2016 CLINICAL DATA:  Chest pain and emesis.  Dementia.  Abdominal pain. EXAM: PORTABLE ABDOMEN - 1 VIEW COMPARISON:  CT 01/12/2016 FINDINGS: Single portable view abdomen and pelvis. Non-obstructive bowel gas pattern. Calcification projecting over the right kidney is likely vascular, when compared to the prior motion degraded CT. No other abnormal abdominal calcifications. Probable phleboliths in the pelvis. Right hip osteoarthritis. S shaped lumbar spine curvature with lumbar spondylosis. IMPRESSION: No acute findings. Electronically Signed   By: Jeronimo Greaves M.D.   On: 02/27/2016 17:06     TODAY-DAY OF DISCHARGE:  Subjective:   Samuel Supan today has no headache,no chest abdominal pain,no new weakness tingling or numbness, feels much better   Objective:   Blood pressure 123/78, pulse (!) 55, temperature 97.8 F (36.6 C), temperature source Oral, resp. rate 18, height 6' (1.829 m), weight 84.9 kg (187 lb 3.2 oz), SpO2 98 %.  Intake/Output Summary (Last 24 hours) at 02/29/16 1026 Last data filed at 02/29/16 0836  Gross per 24 hour  Intake             1200 ml  Output             1110 ml  Net               90 ml   Filed Weights   02/27/16 2340 02/28/16 0419  02/29/16 0448  Weight: 84.3 kg (185 lb 12.8 oz) 83.2 kg (183 lb 6.4 oz) 84.9 kg (187 lb 3.2 oz)    Exam: Awake Alert, Oriented *3, No new F.N deficits, Normal affect [place].AT,PERRAL Supple Neck,No JVD, No cervical lymphadenopathy appriciated.  Symmetrical Chest wall movement, Good air movement bilaterally, CTAB RRR,No Gallops,Rubs or new Murmurs, No Parasternal Heave +ve B.Sounds, Abd Soft, Non tender, No organomegaly appriciated, No rebound -guarding or rigidity. No Cyanosis, Clubbing or edema, No new Rash or bruise   PERTINENT RADIOLOGIC STUDIES: Dg Chest 2 View  Result Date: 02/27/2016 CLINICAL DATA:  Chest pain with nausea and vomiting. EXAM: CHEST  2 VIEW COMPARISON:  01/16/2016 hand 08/17/2015 FINDINGS: There is slight pulmonary vascular  prominence with slight interstitial edema at the lung bases. Slight chronic cardiomegaly. Pacemaker in place. No acute bone abnormality. No definitive effusions. Aortic valve replacement. IMPRESSION: Slight interstitial pulmonary edema. Electronically Signed   By: Francene Boyers M.D.   On: 02/27/2016 15:08   Dg Chest Port 1 View  Result Date: 02/28/2016 CLINICAL DATA:  Heart failure. EXAM: PORTABLE CHEST 1 VIEW COMPARISON:  02/27/2016,, 03/01/2015 11/12/2013, 06/23/2013, 10/08/2011. FINDINGS: Cardiac pacer with lead tip over the right ventricle. Prior cardiac valve replacement. Cardiomegaly with mild diffuse interstitial prominence suggesting a mild component of congestive heart failure. Underlying chronic interstitial lung disease cannot be excluded . No interim change. No pleural effusion or pneumothorax . IMPRESSION: Cardiac pacer noted with lead tip over the right ventricle. No pneumothorax. 2. Prior cardiac valve replacement. Cardiomegaly with mild bilateral from interstitial prominence suggesting a mild component of congestive heart failure. Underlying chronic interstitial lung disease cannot be excluded . Electronically Signed   By: Maisie Fus  Register    On: 02/28/2016 07:17   Dg Abd Portable 1v  Result Date: 02/27/2016 CLINICAL DATA:  Chest pain and emesis.  Dementia.  Abdominal pain. EXAM: PORTABLE ABDOMEN - 1 VIEW COMPARISON:  CT 01/12/2016 FINDINGS: Single portable view abdomen and pelvis. Non-obstructive bowel gas pattern. Calcification projecting over the right kidney is likely vascular, when compared to the prior motion degraded CT. No other abnormal abdominal calcifications. Probable phleboliths in the pelvis. Right hip osteoarthritis. S shaped lumbar spine curvature with lumbar spondylosis. IMPRESSION: No acute findings. Electronically Signed   By: Jeronimo Greaves M.D.   On: 02/27/2016 17:06     PERTINENT LAB RESULTS: CBC:  Recent Labs  02/27/16 1314  WBC 11.4*  HGB 10.0*  HCT 32.8*  PLT 175   CMET CMP     Component Value Date/Time   NA 136 02/29/2016 0318   NA 139 08/22/2015   K 4.4 02/29/2016 0318   CL 103 02/29/2016 0318   CO2 22 02/29/2016 0318   GLUCOSE 234 (H) 02/29/2016 0318   GLUCOSE 133 (H) 05/14/2006 0728   BUN 42 (H) 02/29/2016 0318   BUN 26 (A) 08/22/2015   CREATININE 2.61 (H) 02/29/2016 0318   CALCIUM 9.0 02/29/2016 0318   PROT 7.2 02/27/2016 1314   ALBUMIN 3.7 02/27/2016 1314   AST 16 02/27/2016 1314   ALT 12 (L) 02/27/2016 1314   ALKPHOS 38 02/27/2016 1314   BILITOT 0.8 02/27/2016 1314   GFRNONAA 21 (L) 02/29/2016 0318   GFRAA 24 (L) 02/29/2016 0318    GFR Estimated Creatinine Clearance: 22.3 mL/min (by C-G formula based on SCr of 2.61 mg/dL).  Recent Labs  02/27/16 1314  LIPASE 18    Recent Labs  02/27/16 2057 02/28/16 0257 02/28/16 0836  TROPONINI 0.28* 0.42* 0.29*   Invalid input(s): POCBNP No results for input(s): DDIMER in the last 72 hours. No results for input(s): HGBA1C in the last 72 hours. No results for input(s): CHOL, HDL, LDLCALC, TRIG, CHOLHDL, LDLDIRECT in the last 72 hours.  Recent Labs  02/27/16 2057  TSH 0.227*   No results for input(s): VITAMINB12, FOLATE,  FERRITIN, TIBC, IRON, RETICCTPCT in the last 72 hours. Coags: No results for input(s): INR in the last 72 hours.  Invalid input(s): PT Microbiology: Recent Results (from the past 240 hour(s))  MRSA PCR Screening     Status: None   Collection Time: 02/27/16  8:17 PM  Result Value Ref Range Status   MRSA by PCR NEGATIVE NEGATIVE Final    Comment:  The GeneXpert MRSA Assay (FDA approved for NASAL specimens only), is one component of a comprehensive MRSA colonization surveillance program. It is not intended to diagnose MRSA infection nor to guide or monitor treatment for MRSA infections.   Culture, blood (routine x 2) Call MD if unable to obtain prior to antibiotics being given     Status: None (Preliminary result)   Collection Time: 02/27/16  8:55 PM  Result Value Ref Range Status   Specimen Description BLOOD LEFT ANTECUBITAL  Final   Special Requests BOTTLES DRAWN AEROBIC AND ANAEROBIC 5CC  Final   Culture NO GROWTH < 24 HOURS  Final   Report Status PENDING  Incomplete  Culture, blood (routine x 2) Call MD if unable to obtain prior to antibiotics being given     Status: None (Preliminary result)   Collection Time: 02/27/16  9:03 PM  Result Value Ref Range Status   Specimen Description BLOOD LEFT HAND  Final   Special Requests IN PEDIATRIC BOTTLE 3CC  Final   Culture NO GROWTH < 24 HOURS  Final   Report Status PENDING  Incomplete    FURTHER DISCHARGE INSTRUCTIONS:  Get Medicines reviewed and adjusted: Please take all your medications with you for your next visit with your Primary MD  Laboratory/radiological data: Please request your Primary MD to go over all hospital tests and procedure/radiological results at the follow up, please ask your Primary MD to get all Hospital records sent to his/her office.  In some cases, they will be blood work, cultures and biopsy results pending at the time of your discharge. Please request that your primary care M.D. goes through all  the records of your hospital data and follows up on these results.  Also Note the following: If you experience worsening of your admission symptoms, develop shortness of breath, life threatening emergency, suicidal or homicidal thoughts you must seek medical attention immediately by calling 911 or calling your MD immediately  if symptoms less severe.  You must read complete instructions/literature along with all the possible adverse reactions/side effects for all the Medicines you take and that have been prescribed to you. Take any new Medicines after you have completely understood and accpet all the possible adverse reactions/side effects.   Do not drive when taking Pain medications or sleeping medications (Benzodaizepines)  Do not take more than prescribed Pain, Sleep and Anxiety Medications. It is not advisable to combine anxiety,sleep and pain medications without talking with your primary care practitioner  Special Instructions: If you have smoked or chewed Tobacco  in the last 2 yrs please stop smoking, stop any regular Alcohol  and or any Recreational drug use.  Wear Seat belts while driving.  Please note: You were cared for by a hospitalist during your hospital stay. Once you are discharged, your primary care physician will handle any further medical issues. Please note that NO REFILLS for any discharge medications will be authorized once you are discharged, as it is imperative that you return to your primary care physician (or establish a relationship with a primary care physician if you do not have one) for your post hospital discharge needs so that they can reassess your need for medications and monitor your lab values.  Total Time spent coordinating discharge including counseling, education and face to face time equals 45 minutes.  SignedJeoffrey Massed 02/29/2016 10:26 AM

## 2016-02-29 NOTE — Progress Notes (Signed)
[place] and speaks [place]; awaiting for Interpreter to discuss Medicare Observation Letter; Interpreter to arrive at 3 pm; Alexis GoodellB Dale Ribeiro RN,MHA,BSN [phone]

## 2016-02-29 NOTE — Care Management Obs Status (Signed)
MEDICARE OBSERVATION STATUS NOTIFICATION   Patient Details  Name: Harold Jones MRN: [id number] Date of Birth: 11/11/1927   Medicare Observation Status Notification Given:  Yes    Cherrie DistanceChandler, Avey Mcmanamon L, RN 02/29/2016, 3:55 PM

## 2016-02-29 NOTE — Progress Notes (Signed)
Occupational Therapy Treatment Patient Details Name: Harold Jones MRN: [id number] DOB: 1928-01-01 Today's Date: 02/29/2016    History of present illness Alphonse Haupert is a 82 y.o. male with dementia, atrial fibrillation with symptomatic bradycardia s/p PPM/ICD insertion, GIB requiring blood transfusion and therefore no longer on anticoagulation, CAD, s/p bioprosthetic aortic valve replacement, diabetes mellitus type 2 who resides at ALF. Per ALF RN, p he was in a common area when he suddenly grasped his chest and ran into the bathroom where he vomited "lime green stuff".  Afterwards, he was pale and clammy.         Follow Up Recommendations  Home health OT;Supervision/Assistance - 24 hour (needs supervision, cues, assist for adls/mobility)    Equipment Recommendations  None recommended by OT    Recommendations for Other Services      Precautions / Restrictions Precautions Precautions: Fall Restrictions Weight Bearing Restrictions: No       Mobility Bed Mobility Overal bed mobility: Modified Independent             General bed mobility comments: HOB raised  Transfers   Equipment used: Rolling walker (2 wheeled) Transfers: Sit to/from Stand Sit to Stand: Min guard Stand pivot transfers: Min guard       General transfer comment: for safety: cues for hand placement as pt kept pushing up from rolling tray table    Balance           Standing balance support: No upper extremity supported;During functional activity Standing balance-Leahy Scale: Fair                     ADL                   Upper Body Dressing : Set up;Sitting   Lower Body Dressing: Minimal assistance;Sit to/from stand   Toilet Transfer: Min guard;Ambulation;RW (recliner)             General ADL Comments: Pt is being discharged back to ALF today.  He was insisting on wearing clothes back home:  Pants/underwear soiled; smelled of urine.    RN present.  We withheld  underwear and pt donned pants with min A.  Interpreter present. She called daughter so that staff will help him change when he returns to ALF.  Pt denied need to use toilet.       Vision                     Perception     Praxis      Cognition   Behavior During Therapy: [facility] for tasks assessed/performed Overall Cognitive Status: No family/caregiver present to determine baseline cognitive functioning                       Extremity/Trunk Assessment               Exercises     Shoulder Instructions       General Comments      Pertinent Vitals/ Pain       Pain Assessment: No/denies pain  Home Living                                          Prior Functioning/Environment              Frequency       Progress Toward  Goals  OT Goals(current goals can now be found in the care plan section)  Progress towards OT goals: Progressing toward goals     Plan      Co-evaluation                 End of Session     Activity Tolerance Patient tolerated treatment well   Patient Left in chair;with call bell/phone within reach;with chair alarm set (interpreter in room also)   Nurse Communication          Time: 1330-1350 OT Time Calculation (min): 20 min  Charges: OT General Charges $OT Visit: 1 Procedure OT Treatments $Self Care/Home Management : 8-22 mins  Esmond Hinch 02/29/2016, 4:00 PM  Marica OtterMaryellen Janilah Hojnacki, OTR/L [phone] 02/29/2016

## 2016-02-29 NOTE — Progress Notes (Signed)
Speech Language Pathology Treatment: Dysphagia  Patient Details Name: Harold Jones MRN: [id number] DOB: 09/06/1927 Today's Date: 02/29/2016 Time: [id number] SLP Time Calculation (min) (ACUTE ONLY): 17 min  Assessment / Plan / Recommendation Clinical Impression  Pt seen for f/u dysphagia tx with use of phone interpreter Harold Jones(Senja [phone]). Pt consumed nectar thick liquids with delayed cough x2, needing Mod cues from SLP for smaller sips. Through interpreter, SLP provided education regarding the rationale for nectar thick liquids during acute hospital stay. Suspect that upon return to ALF, pt will be able to progress back to thin liquids when he can more consistently take small, single sips to reduce the risks of aspiration.    HPI HPI: Harold Jones a 82 y.o.malewith dementia, atrial fibrillation with symptomatic bradycardia s/p PPM/ICD insertion, GIB requiring blood transfusion and therefore no longer on anticoagulation, CAD, s/p bioprosthetic aortic valve replacement, diabetes mellitus type 2 [facility] Admitted with abdominal pain, vomiting. Found to have Acute hypoxic respiratory failurelikely due to acute on chronic diastolic heart failureversus aspiration pneumonia or less likely,HCAP. MD reports pt WBC is double baseline and infections suspected, though CXR looks like intersitial edema. Pt has a history of dysphagia and has had multiple MBS and clinical swallow evals over the past year. MBS (3, last in 12/2015) consistently show sensed and silent aspriation of thin liquids and pt has been recommended to consume nectar, though taking small sips reduces risk. Granddaughter over the phone reports she does not think pt has been on a modified diet at his assisted living, but her family wants thickened liquids if they are necessary.       SLP Plan  Continue with current plan of care     Recommendations  Diet recommendations: Dysphagia 2 (fine chop);Nectar-thick liquid Liquids  provided via: Straw;Cup Medication Administration: Whole meds with liquid Supervision: Patient able to self feed;Full supervision/cueing for compensatory strategies Compensations: Slow rate;Small sips/bites Postural Changes and/or Swallow Maneuvers: Seated upright 90 degrees;Upright 30-60 min after meal             Oral Care Recommendations: Oral care BID Follow up Recommendations: Home health SLP;24 hour supervision/assistance Plan: Continue with current plan of care     GO                Harold Hamaiewonsky, Khyleigh Furney 02/29/2016, 11:02 AM  Harold HamLaura Jones, M.A. CCC-SLP [phone]

## 2016-02-29 NOTE — Progress Notes (Addendum)
Patient to be discharged to ALF today. Telemetry and IV removed; site WNL. Discharge paperwork review with patient with use of an interpreter. No further questions or concerns. Family aware that patient is going to be discharged today. [facility]. Awaiting [facility] for transport.

## 2016-02-29 NOTE — Clinical Social Work Note (Addendum)
FL2 and [facility]. Will follow up with them after they have had time to review documentation. CSW called and left voicemail for patient's granddaughter to see if he will need PTAR or not.  Charlynn CourtSarah Jacquie Lukes, CSW [phone]  12:38 pm Patient's granddaughter called back and stated that patient will need PTAR. Will notify her when called. Still waiting on ALF to give clearance to return.  Charlynn CourtSarah Aeneas Longsworth, CSW [phone]  1:36 pm Spoke with ALF. Patient can discharge back to facility when ready.  Charlynn CourtSarah Ayvah Caroll, CSW [phone]

## 2016-02-29 NOTE — Clinical Social Work Note (Addendum)
CSW facilitated patient discharge including contacting patient family and facility to confirm patient discharge plans. Clinical information faxed to facility and family agreeable with plan. CSW arranged ambulance [facility] via [facility] around 3:30 pm. RN to call report prior to discharge [phone]).  CSW will sign off for now as social work intervention is no longer needed. Please consult us again if new needs arise.  Charlynn CourtSarah Tianah Lonardo, CSW [phone]

## 2016-02-29 NOTE — Progress Notes (Signed)
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 Ladona Ridgel (previously Dr. Jens Som last seen 01/18/10) Patient Profile: Harold Jones is a 82 y.o. male with a history of aortic valve disease status post previous bioprosthetic valve as well as replacement of thoracic aneurysm, PPM 08/2015 for symptomatic bradycardia, chronic atrial fibrillation not on anticoagulation due to recent GI bleed requiring transfusion, CVA, diabetes, hypertension, hyperlipidemia and vascular dementia who lives at [facility] and presented for evaluation of chest pain.  SUBJECTIVE: Resting comfortably, in no acute distress.    OBJECTIVE Vitals:   02/28/16 1206 02/28/16 1454 02/28/16 1953 02/29/16 0448  BP: (!) 104/46  (!) 100/47 123/78  Pulse: (!) 51 65 (!) 56 (!) 55  Resp: 16  18 18   Temp: 97.6 F (36.4 C)  97.7 F (36.5 C) 97.8 F (36.6 C)  TempSrc: Oral  Oral Oral  SpO2: 97% 95% 94% 98%  Weight:    187 lb 3.2 oz (84.9 kg)  Height:        Intake/Output Summary (Last 24 hours) at 02/29/16 0803 Last data filed at 02/29/16 0530  Gross per 24 hour  Intake             1200 ml  Output             1760 ml  Net             -560 ml   Filed Weights   02/27/16 2340 02/28/16 0419 02/29/16 0448  Weight: 185 lb 12.8 oz (84.3 kg) 183 lb 6.4 oz (83.2 kg) 187 lb 3.2 oz (84.9 kg)    PHYSICAL EXAM General: Well developed, well nourished, male in no acute distress. Head: Normocephalic, atraumatic.  Neck: Supple without bruits, no JVD. Lungs:  Resp regular and unlabored, CTA. Heart: RRR, S1, S2, no S3, S4, 2/6 systolic murmur; no rub. Abdomen: Soft, non-tender, non-distended, BS + x 4.  Extremities: No clubbing,  cyanosis, no edema.  Neuro: Alert and oriented X 3. Moves all extremities spontaneously. Psych: Normal affect.  LABS: CBC: Recent Labs  02/27/16 1314  WBC 11.4*  NEUTROABS 9.2*  HGB 10.0*  HCT 32.8*  MCV 81.2  PLT 175   Basic Metabolic Panel: Recent Labs  02/28/16 0257 02/29/16 0318  NA 139 136  K 4.6 4.4  CL 105 103  CO2 26 22  GLUCOSE 152* 234*  BUN 27* 42*  CREATININE 1.70* 2.61*  CALCIUM 9.3 9.0   Liver Function Tests: Recent Labs  02/27/16 1314  AST 16  ALT 12*  ALKPHOS 38  BILITOT 0.8  PROT 7.2  ALBUMIN 3.7   Cardiac Enzymes: Recent Labs  02/27/16 2057 02/28/16 0257 02/28/16 0836  TROPONINI 0.28* 0.42* 0.29*    Recent Labs  02/27/16 1320  TROPIPOC 0.02   BNP:  B Natriuretic Peptide  Date/Time Value Ref Range Status  02/27/2016 01:14 PM 203.5 (H) 0.0 - 100.0 pg/mL Final   Thyroid Function Tests: Recent Labs  02/27/16 2057  TSH 0.227*    Current Facility-Administered Medications:  .  0.9 %  sodium chloride infusion, 250 mL, Intravenous, PRN, Renae Fickle, MD .  acetaminophen (TYLENOL) tablet 650 mg, 650 mg, Oral, Q4H PRN, Renae Fickle, MD .  aspirin EC tablet 81 mg, 81 mg, Oral, Daily, Renae FickleMackenzie Short, MD, 81 mg at 02/28/16 0939 .  atorvastatin (LIPITOR) tablet 10 mg, 10 mg, Oral, Daily, Renae FickleMackenzie Short, MD, 10 mg at 02/28/16 0939 .  docusate sodium (COLACE) capsule 100 mg, 100 mg, Oral, BID, Renae FickleMackenzie Short, MD, 100 mg at 02/28/16 2202 .  enoxaparin (LOVENOX) injection 30 mg, 30 mg, Subcutaneous, Q24H, Renae FickleMackenzie Short, MD, 30 mg at 02/28/16 2203 .  fluticasone (FLONASE) 50 MCG/ACT nasal spray 2 spray, 2 spray, Each Nare, Daily, Renae FickleMackenzie Short, MD, 2 spray at 02/28/16 0940 .  gabapentin (NEURONTIN) capsule 300 mg, 300 mg, Oral, BID, Renae FickleMackenzie Short, MD, 300 mg at 02/28/16 2203 .  insulin aspart (novoLOG) injection 0-9 Units, 0-9 Units, Subcutaneous, TID WC, Renae FickleMackenzie Short, MD, 2 Units at 02/29/16 [phone] .  ondansetron (ZOFRAN)  injection 4 mg, 4 mg, Intravenous, Q6H PRN, Renae FickleMackenzie Short, MD .  pantoprazole (PROTONIX) EC tablet 40 mg, 40 mg, Oral, Daily, Renae FickleMackenzie Short, MD, 40 mg at 02/28/16 0939 .  polyethylene glycol (MIRALAX / GLYCOLAX) packet 17 g, 17 g, Oral, Daily, Renae FickleMackenzie Short, MD, 17 g at 02/28/16 0737 .  RESOURCE THICKENUP CLEAR, , Oral, PRN, Maretta BeesShanker M Ghimire, MD .  sodium chloride flush (NS) 0.9 % injection 3 mL, 3 mL, Intravenous, Q12H, Renae FickleMackenzie Short, MD, 3 mL at 02/28/16 2203 .  sodium chloride flush (NS) 0.9 % injection 3 mL, 3 mL, Intravenous, PRN, Renae FickleMackenzie Short, MD .  tamsulosin (FLOMAX) capsule 0.4 mg, 0.4 mg, Oral, q morning - 10a, Renae FickleMackenzie Short, MD, 0.4 mg at 02/28/16 [id number]    TELE:     V paced    Radiology/Studies: Dg Chest 2 View  Result Date: 02/27/2016 CLINICAL DATA:  Chest pain with nausea and vomiting. EXAM: CHEST  2 VIEW COMPARISON:  01/16/2016 hand 08/17/2015 FINDINGS: There is slight pulmonary vascular prominence with slight interstitial edema at the lung bases. Slight chronic cardiomegaly. Pacemaker in place. No acute bone abnormality. No definitive effusions. Aortic valve replacement. IMPRESSION: Slight interstitial pulmonary edema. Electronically Signed   By: Francene BoyersJames  Maxwell M.D.   On: 02/27/2016 15:08   Dg Chest Port 1 View  Result Date: 02/28/2016 CLINICAL DATA:  Heart failure. EXAM: PORTABLE CHEST 1 VIEW COMPARISON:  02/27/2016,, 03/01/2015 11/12/2013, 06/23/2013, 10/08/2011. FINDINGS: Cardiac pacer with lead tip over the right ventricle. Prior cardiac valve replacement. Cardiomegaly with mild diffuse interstitial prominence suggesting a mild component of congestive heart failure. Underlying chronic interstitial lung disease cannot be excluded . No interim change. No pleural effusion or pneumothorax . IMPRESSION: Cardiac pacer noted with lead tip over the right ventricle. No pneumothorax. 2. Prior cardiac valve replacement. Cardiomegaly with mild bilateral from interstitial  prominence suggesting a mild component of congestive heart failure. Underlying chronic interstitial lung disease cannot be excluded . Electronically Signed   By: Maisie Fushomas  Register   On: 02/28/2016 07:17   Dg Abd Portable 1v  Result Date: 02/27/2016 CLINICAL DATA:  Chest pain and emesis.  Dementia.  Abdominal pain. EXAM: PORTABLE ABDOMEN - 1 VIEW COMPARISON:  CT 01/12/2016 FINDINGS: Single portable view abdomen and pelvis. Non-obstructive bowel gas pattern. Calcification projecting over the right kidney is likely vascular, when compared to the prior motion degraded CT. No other abnormal abdominal calcifications. Probable phleboliths in the pelvis. Right hip osteoarthritis. S shaped lumbar spine curvature with lumbar spondylosis. IMPRESSION: No acute findings. Electronically Signed   By: Jeronimo GreavesKyle  Talbot M.D.   On: 02/27/2016 17:06     Current Medications:  . aspirin  EC  81 mg Oral Daily  . atorvastatin  10 mg Oral Daily  . docusate sodium  100 mg Oral BID  . enoxaparin (LOVENOX) injection  30 mg Subcutaneous Q24H  . fluticasone  2 spray Each Nare Daily  . gabapentin  300 mg Oral BID  . insulin aspart  0-9 Units Subcutaneous TID WC  . pantoprazole  40 mg Oral Daily  . polyethylene glycol  17 g Oral Daily  . sodium chloride flush  3 mL Intravenous Q12H  . 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 Chest pain and elevated troponin: EKG shows atrial fibrillation (? P wave) at rate of 67 bpm, chronic left bundle branch block and T-wave inversion in lead 1 and aVL which appears similar to prior EKG. Point-of-care troponin 0.00. Troponin I of 0.28--> 0.42--> 0.29, likely representative of CHF.   Currently chest  pain-free. Not a good candidate for invasive procedure given recent GIB requiring blood transfusion and chronic renal disease.  Continue ASA and statin.   2. S/p AVR bioprosthetic valve as well as repair of thoracic aneurysm - Echo 12/2015 showed mild AS, mild AI. VTI ratio of LVOT to aortic valve: 0.31. Peak velocity ratio of LVOT to aortic valve: 0.29. Mean velocity ratio of LVOT to aortic valve: 0.31. Mean gradient (S): 26 mmHg. Peak gradient (S): 55 mm Hg.  3. Chronic Afib - Rate controlled. CHADSVASc score of 6.  Not on anticoagulation due to recent GI bleed requiring transfusion.    4. Symptomatic Bradycardia due to CHB  s/p PPM ([place] Sci single-chamber) 08/16/15 - Last device check was normal yesterday in ER. Enrolled in remote check every night.   5. Hyperthyroidism - Per primary  6. Acute on CKD, stage III - serum creatinine of 1.88-->1.7.   7. DM -Per primary  8. Leg weakness - Per primary  9. CXR concerning for his CHF vs infiltrate.  - BNP is 203.  - Given Lasix 60 mg 1. Net I&O negative 1.8L.  - Abx discontinued   - Lasix increased to 40mg  daily yesterday.      Signed, Little Ishikawa , NP 8:04 AM 02/29/2016 Pager [phone]  Chart reviewed. Pt to be discharged. No further cardiac recommendations. Pt not seen today.  Tonny Bollman 02/29/2016 10:28 PM

## 2016-03-02 ENCOUNTER — Encounter: Payer: Self-pay | Admitting: Physician Assistant

## 2016-03-03 LAB — CULTURE, BLOOD (ROUTINE X 2)
Culture: NO GROWTH
Culture: NO GROWTH

## 2016-03-05 NOTE — Progress Notes (Deleted)
Cardiology Office Note Date:  03/05/2016  Patient ID:  Harold Jones, DOB 09/05/1927, MRN [id number] PCP:   Julieanne MansonMULBERRY,ELIZABETH, MD Cardiologist:  Dr. Ladona Ridgelaylor  ***refresh   Chief Complaint: ***  History of Present Illness: Harold Jones is a 82 y.o. male with history of aortic valve disease status post previous bioprosthetic valve as well as replacement of thoracic aneurysm, PPM 08/2015 for symptomatic bradycardia, chronic atrial fibrillationnot on anticoagulation due to recent GI bleed requiring transfusion, hospital notes state permanently off a/c, CVA, diabetes, hypertension, hyperlipidemia and vascular dementia [facility].  He comes in today to be seen for Dr. Ladona Ridgelaylor, was recently hospitalized, part of this stay secondary to CP, was noted to have mildly elevated Troponins, felt to be secondary to CHF, and not a candidate for invsive w/u secondary to recent GIB, renal disease.  He was discharged 02/29/16, there was some conceren for aspiration pneumonia, though this eventually felt not to be the case and abx stopped, he was treated woih IV lasx and transitioned to PO.  His discharge summary suggests out patient palliative eval given advancing age, dementia and multiple medical problems.    He comes in today ***   Speaks Bosnian   *** symptoms? *** weight, f;luid status *** labs done at SNF? *** who is PMD??? *** DNR status in-patient  Device information: BSCi single chamber PPM, implanted 08/16/15, Dr. Ladona Ridgelaylor, symptomatic bradycardia   Past Medical History:  Diagnosis Date  . Aortic valve disorder   . BPH without obstruction/lower urinary tract symptoms   . Chest wall pain following surgery   . COGNITIVE DEFICITS DUE CEREBROVASCULAR DISEASE 09/09/2009   Qualifier: Diagnosis of  By: Delrae AlfredMulberry MD, Harold ManisElizabeth    . CORONARY ARTERY DISEASE 02/27/2007   Qualifier: Diagnosis of  By: Barbaraann Barthelankins MD, [place]    . CVA 02/28/2004   Annotation: embolic Qualifier: Diagnosis of  By:  Barbaraann Barthelankins MD, [place]    . D[facility]    [facility]  . Diabetes mellitus   . DYSLIPIDEMIA 02/27/2007   Qualifier: Diagnosis of  By: Barbaraann Barthelankins MD, [place]    . FIBRILLATION, ATRIAL 08/31/2002   Qualifier: Diagnosis of  By: Barbaraann Barthelankins MD, [place]    . Grief   . Hypertension   . Neuropathic pain   . Osteoarthritis of left knee   . Physical deconditioning   . Symptomatic bradycardia 08/16/15   [employer] PPM, Dr. Ladona Ridgelaylor    Past Surgical History:  Procedure Laterality Date  . CARDIAC SURGERY     bioprosthetic AV replacement  . EP IMPLANTABLE DEVICE N/A 08/16/2015   [facility] Dr. Ladona Ridgelaylor  . ESOPHAGOGASTRODUODENOSCOPY (EGD) WITH PROPOFOL N/A 01/17/2016   Procedure: ESOPHAGOGASTRODUODENOSCOPY (EGD) WITH PROPOFOL;  Surgeon: Bernette Redbirdobert Buccini, MD;  [facility];  Service: Endoscopy;  Laterality: N/A;    Current Outpatient Prescriptions  Medication Sig Dispense Refill  . acetaminophen (MAPAP) 325 MG tablet Take 650 mg by mouth 2 (two) times daily. NOT TO EXCEED 3,000 MG/DAY FROM ALL (COMBINED) SOURCES    . aspirin 81 MG chewable tablet Chew 1 tablet (81 mg total) by mouth daily.    Marland Kitchen. atorvastatin (LIPITOR) 10 MG tablet Take 10 mg by mouth daily.    Marland Kitchen. docusate sodium (COLACE) 100 MG capsule Take 100 mg by mouth 2 (two) times daily.    . fluticasone (FLONASE ALLERGY RELIEF) 50 MCG/ACT nasal spray Place 2 sprays into both nostrils daily. For 14 days (Start date: 02/22/16)    . furosemide (LASIX)  20 MG tablet Take 2 tablets (40 mg total) by mouth daily. Reported on 08/17/2015    . gabapentin (NEURONTIN) 300 MG capsule Take 300 mg by mouth 2 (two) times daily.    Marland Kitchen glipiZIDE (GLUCOTROL) 5 MG tablet Take 5 mg by mouth daily before breakfast.     . hydrALAZINE (APRESOLINE) 25 MG tablet Take 1 tablet (25 mg total) by mouth 2 (two) times daily. 60 tablet 0  . pantoprazole (PROTONIX) 40 MG tablet Take 1 tablet (40 mg total) by mouth daily. 40 tablet 0  .  polyethylene glycol (MIRALAX / GLYCOLAX) packet Take 17 g by mouth daily. Mix with 8 ounces of liquid/Hold for loose stools    . tamsulosin (FLOMAX) 0.4 MG CAPS capsule Take 0.4 mg by mouth every morning.   3  . traMADol (ULTRAM) 50 MG tablet Take 50 mg by mouth every 6 (six) hours as needed (FOR KNEE PAIN).     No current facility-administered medications for this visit.     Allergies:   Review of patient's allergies indicates no known allergies.   Social History:  The patient  reports that he has never smoked. He has never used smokeless tobacco. He reports that he does not drink alcohol or use drugs.   Family History:  The patient's family history includes CAD in his brother and father; Other in his mother.  ROS:  Please see the history of present illness.  All other systems are reviewed and otherwise negative.   PHYSICAL EXAM: *** VS:  There were no vitals taken for this visit. BMI: There is no height or weight on file to calculate BMI. Well nourished, well developed, in no acute distress  HEENT: normocephalic, atraumatic  Neck: no JVD, carotid bruits or masses Cardiac:  normal S1, S2; RRR; no significant murmurs, no rubs, or gallops Lungs:  clear to auscultation bilaterally, no wheezing, rhonchi or rales  Abd: soft, nontender MS: no deformity or atrophy Ext: no edema  Skin: warm and dry, no rash Neuro:  No gross deficits appreciated Psych: euthymic mood, full affect  *** PPM site is stable, no tethering or discomfort   EKG:  Done today shows ***  11/13/13: Echocardiogram Study Conclusions - Left ventricle: The cavity size was mildly dilated. Systolic function was vigorous. The estimated ejection fraction was in the range of 65% to 70%. Wall motion was normal; there were no regional wall motion abnormalities. - Aortic valve: Valve area: 1.09cm^2 (Vmax). - Mitral valve: Calcified annulus. Mildly thickened leaflets - Left atrium: The atrium was moderately dilated. -  Right ventricle: The cavity size was mildly dilated. Wall thickness was normal. Systolic function was normal. - Pulmonary arteries: PA peak pressure: 40mm Hg (S). - Pericardium, extracardiac: There was no pericardial effusion. - Impressions: Stable mild paravalvular leak. Mildly elevated transaortic gradients, but stable when compared to the prior study from 03/2012. Impressions: - Stable mild paravalvular leak. Mildly elevated transaortic gradients, but stable when compared to the prior study from 03/2012.   Recent Labs: 02/27/2016: ALT 12; B Natriuretic Peptide 203.5; Hemoglobin 10.0; Platelets 175; TSH 0.227 02/29/2016: BUN 42; Creatinine, Ser 2.61; Potassium 4.4; Sodium 136  No results found for requested labs within last 8760 hours.   Estimated Creatinine Clearance: 22.3 mL/min (by C-G formula based on SCr of 2.61 mg/dL).   Wt Readings from Last 3 Encounters:  02/29/16 187 lb 3.2 oz (84.9 kg)  01/18/16 190 lb 14.4 oz (86.6 kg)  09/06/15 197 lb 6 oz (89.5 kg)  Other studies reviewed: Additional studies/records reviewed today include: summarized above  ASSESSMENT AND PLAN:  1. Chronic AFib, SVR s/p PPM     CHA2DS2Vasc is at least 7, a/c was discontinued after GIB requiring PRBC, notes state permanently     *** normal device function  2. HTN    *** controlled  3. Recent hospital stay woth ?CP/CHF     *** fluid status     *** labs    Disposition: F/u with ***  Current medicines are reviewed at length with the patient today.  The patient did not have any concerns regarding medicines.***  Signed, Sherrilee Gilles, PA-C 03/05/2016 2:23 PM     [facility] [address] [place] [address] [phone] (office)  [phone] (fax)

## 2016-03-06 ENCOUNTER — Encounter: Payer: Medicare Other | Admitting: Physician Assistant

## 2016-03-07 ENCOUNTER — Encounter: Payer: Self-pay | Admitting: Physician Assistant

## 2016-04-25 ENCOUNTER — Emergency Department (HOSPITAL_COMMUNITY)
Admission: EM | Admit: 2016-04-25 | Discharge: 2016-04-25 | Disposition: A | Discharge status: Home / Self Care | Payer: Medicare Other | Attending: Emergency Medicine | Admitting: Emergency Medicine

## 2016-04-25 ENCOUNTER — Encounter (HOSPITAL_COMMUNITY): Payer: Self-pay

## 2016-04-25 ENCOUNTER — Emergency Department (HOSPITAL_COMMUNITY): Payer: Medicare Other

## 2016-04-25 DIAGNOSIS — Z7984 Long term (current) use of oral hypoglycemic drugs: Secondary | ICD-10-CM | POA: Diagnosis not present

## 2016-04-25 DIAGNOSIS — Z7982 Long term (current) use of aspirin: Secondary | ICD-10-CM | POA: Diagnosis not present

## 2016-04-25 DIAGNOSIS — E1149 Type 2 diabetes mellitus with other diabetic neurological complication: Secondary | ICD-10-CM | POA: Insufficient documentation

## 2016-04-25 DIAGNOSIS — I2510 Atherosclerotic heart disease of native coronary artery without angina pectoris: Secondary | ICD-10-CM | POA: Diagnosis not present

## 2016-04-25 DIAGNOSIS — I110 Hypertensive heart disease with heart failure: Secondary | ICD-10-CM | POA: Diagnosis not present

## 2016-04-25 DIAGNOSIS — R079 Chest pain, unspecified: Secondary | ICD-10-CM | POA: Insufficient documentation

## 2016-04-25 DIAGNOSIS — Z8673 Personal history of transient ischemic attack (TIA), and cerebral infarction without residual deficits: Secondary | ICD-10-CM | POA: Diagnosis not present

## 2016-04-25 DIAGNOSIS — I509 Heart failure, unspecified: Secondary | ICD-10-CM | POA: Diagnosis not present

## 2016-04-25 LAB — CBC
HEMATOCRIT: 29.8 % — AB (ref 39.0–52.0)
HEMOGLOBIN: 9.3 g/dL — AB (ref 13.0–17.0)
MCH: 24.2 pg — AB (ref 26.0–34.0)
MCHC: 31.2 g/dL (ref 30.0–36.0)
MCV: 77.6 fL — AB (ref 78.0–100.0)
Platelets: 182 10*3/uL (ref 150–400)
RBC: 3.84 MIL/uL — AB (ref 4.22–5.81)
RDW: 16 % — ABNORMAL HIGH (ref 11.5–15.5)
WBC: 7 10*3/uL (ref 4.0–10.5)

## 2016-04-25 LAB — I-STAT TROPONIN, ED
TROPONIN I, POC: 0.03 ng/mL (ref 0.00–0.08)
Troponin i, poc: 0.02 ng/mL (ref 0.00–0.08)

## 2016-04-25 LAB — BASIC METABOLIC PANEL
ANION GAP: 11 (ref 5–15)
BUN: 38 mg/dL — ABNORMAL HIGH (ref 6–20)
CALCIUM: 9.4 mg/dL (ref 8.9–10.3)
CHLORIDE: 106 mmol/L (ref 101–111)
CO2: 21 mmol/L — AB (ref 22–32)
Creatinine, Ser: 1.9 mg/dL — ABNORMAL HIGH (ref 0.61–1.24)
GFR calc non Af Amer: 30 mL/min — ABNORMAL LOW (ref >60)
GFR, EST AFRICAN AMERICAN: 35 mL/min — AB (ref >60)
GLUCOSE: 202 mg/dL — AB (ref 65–99)
POTASSIUM: 3.8 mmol/L (ref 3.5–5.1)
Sodium: 138 mmol/L (ref 135–145)

## 2016-04-25 NOTE — ED Triage Notes (Signed)
Pt BIB [facility] for evaluation of CP starting today. Pt. Speaks Bosnian as primary language. [facility], states pt. Began complaining of CP this AM. Pt. Given 324 ASA and 1 Nitro PTA. Pt. With hx of CABG and pacemaker. VSS. Pt. Alert and at baseline per facility. Pt. Has hx of dementia.

## 2016-04-25 NOTE — Discharge Instructions (Signed)
Workup for the chest pain without any acute findings. Patient without any complaint of chest pain here. Stable for return back to nursing facility.

## 2016-04-25 NOTE — ED Provider Notes (Addendum)
[facility]-EMERGENCY DEPT Provider Note   CSN: [id number] Arrival date & time: 04/25/16  0935     History   Chief Complaint Chief Complaint  Patient presents with  . Chest Pain    HPI Harold Jones is a 82 y.o. male.  Asian sent in from nursing facility. Reportedly was complaining of chest pain. Upon arrival here no further complaint of chest pain. Patient was given aspirin and 1 nitroglycerin prior to arrival by EMS. Patient has a known history of coronary artery disease. Had a CABG has a pacemaker. Patient with recent admission for chest pain was deemed by cardiology to be medical management only not a candidate for invasive procedures. Patient is a DO NOT RESUSCITATE. Patient has a history of dementia.      Past Medical History:  Diagnosis Date  . Aortic valve disorder   . BPH without obstruction/lower urinary tract symptoms   . Chest wall pain following surgery   . COGNITIVE DEFICITS DUE CEREBROVASCULAR DISEASE 09/09/2009   Qualifier: Diagnosis of  By: Delrae AlfredMulberry MD, Lanora ManisElizabeth    . CORONARY ARTERY DISEASE 02/27/2007   Qualifier: Diagnosis of  By: Barbaraann Barthelankins MD, [place]    . CVA 02/28/2004   Annotation: embolic Qualifier: Diagnosis of  By: Barbaraann Barthelankins MD, [place]    . Dementia    [facility]  . Diabetes mellitus   . DYSLIPIDEMIA 02/27/2007   Qualifier: Diagnosis of  By: Barbaraann Barthelankins MD, [place]    . FIBRILLATION, ATRIAL 08/31/2002   Qualifier: Diagnosis of  By: Barbaraann Barthelankins MD, [place]    . Grief   . Hypertension   . Neuropathic pain   . Osteoarthritis of left knee   . Physical deconditioning   . Symptomatic bradycardia 08/16/15   [employer] PPM, Dr. Ladona Ridgelaylor    Patient Active Problem List   Diagnosis Date Noted  . CHF exacerbation (HCC) 02/27/2016  . Altered mental state   . Confusion   . Acute GI bleeding   . GI bleed 01/12/2016  . GIB (gastrointestinal bleeding) 01/12/2016  . Symptomatic bradycardia 08/15/2015  . Intraventricular conduction delay  08/15/2015  . S/P aortic valve replacement with bioprosthetic valve 08/15/2015  . Atrial fibrillation with slow ventricular response (HCC) 08/15/2015  . Severe sepsis (HCC) 05/30/2015  . Acute respiratory failure (HCC) 05/30/2015  . History of stroke 03/15/2015  . Benign hypertensive heart disease without heart failure 03/08/2015  . 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 BPH without obstruction/lower urinary tract symptoms 03/08/2015  . Post herpetic neuralgia 03/08/2015  . CAP (community acquired pneumonia) 03/01/2015  . Syncope and collapse 11/12/2013  . Nasal fracture 11/12/2013  . Encephalopathy acute 06/23/2013  . Diabetes mellitus, type 2 (HCC) 06/23/2013  . Hypoglycemia 06/23/2013  . Supratherapeutic INR 06/23/2013  . Zoster 10/12/2011  . Altered mental status 10/08/2011  . GLAUCOMA 09/08/2010  . KNEE PAIN, LEFT 11/22/2009  . 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 CAROTID BRUIT, RIGHT 01/18/2009  . GASTROINTESTINAL HEMORRHAGE 09/26/2007  . OSTEOARTHRITIS 03/18/2007  . Dyslipidemia 02/27/2007  . Coronary atherosclerosis 02/27/2007  . Essential hypertension 02/25/2007  . Cerebral artery occlusion with cerebral infarction (HCC) 02/28/2004  . ASCENDING AORTIC ANEURYSM 02/28/2004  . H/O aortic valve replacement 02/28/2004  . FIBRILLATION, ATRIAL 08/31/2002    Past Surgical History:  Procedure Laterality Date  . CARDIAC SURGERY     bioprosthetic AV replacement  . EP IMPLANTABLE DEVICE N/A 08/16/2015   [facility] Dr. Ladona Ridgelaylor  .  ESOPHAGOGASTRODUODENOSCOPY (EGD) WITH PROPOFOL N/A 01/17/2016   Procedure: ESOPHAGOGASTRODUODENOSCOPY (EGD) WITH PROPOFOL;  Surgeon: Bernette Redbird, MD;  Location: [facility] ENDOSCOPY;  Service: Endoscopy;  Laterality: N/A;       Home Medications    Prior to Admission medications   Medication Sig Start Date End Date Taking? Authorizing Provider  acetaminophen  (MAPAP) 325 MG tablet Take 650 mg by mouth 2 (two) times daily. NOT TO EXCEED 3,000 MG/DAY FROM ALL (COMBINED) SOURCES   Yes Historical Provider, MD  aspirin 81 MG chewable tablet Chew 1 tablet (81 mg total) by mouth daily. 02/29/16  Yes Shanker Levora Dredge, MD  atorvastatin (LIPITOR) 10 MG tablet Take 10 mg by mouth daily.   Yes Historical Provider, MD  docusate sodium (COLACE) 100 MG capsule Take 100 mg by mouth 2 (two) times daily.   Yes Historical Provider, MD  furosemide (LASIX) 20 MG tablet Take 2 tablets (40 mg total) by mouth daily. Reported on 08/17/2015 03/01/16  Yes Shanker Levora Dredge, MD  gabapentin (NEURONTIN) 300 MG capsule Take 300 mg by mouth 2 (two) times daily.   Yes Historical Provider, MD  glipiZIDE (GLUCOTROL) 5 MG tablet Take 5 mg by mouth daily before breakfast.  11/14/13  Yes Esperanza Sheets, MD  hydrALAZINE (APRESOLINE) 25 MG tablet Take 1 tablet (25 mg total) by mouth 2 (two) times daily. 02/29/16  Yes Shanker Levora Dredge, MD  pantoprazole (PROTONIX) 40 MG tablet Take 1 tablet (40 mg total) by mouth daily. 01/19/16  Yes Richarda Overlie, MD  polyethylene glycol (MIRALAX / GLYCOLAX) packet Take 17 g by mouth daily. Mix with 8 ounces of liquid/Hold for loose stools   Yes Historical Provider, MD  tamsulosin (FLOMAX) 0.4 MG CAPS capsule Take 0.4 mg by mouth every morning.  02/12/15  Yes Historical Provider, MD  traMADol (ULTRAM) 50 MG tablet Take 50 mg by mouth 2 (two) times daily.    Yes Historical Provider, MD    Family History Family History  Problem Relation Age of Onset  . Other Mother     old age  . CAD Father   . CAD Brother     Social History Social History  Substance Use Topics  . Smoking status: Never Smoker  . Smokeless tobacco: Never Used  . Alcohol use No     Allergies   Review of patient's allergies indicates no known allergies.   Review of Systems Review of Systems  Unable to perform ROS: Dementia     Physical Exam Updated Vital Signs BP 158/67    Pulse (!) 36   Temp 98.1 F (36.7 C) (Oral)   Resp 18   Ht 6' (1.829 m)   Wt 81.6 kg   SpO2 95%   BMI 24.41 kg/m   Physical Exam  Constitutional: He appears well-developed and well-nourished. No distress.  HENT:  Head: Normocephalic and atraumatic.  Mouth/Throat: Oropharynx is clear and moist.  Eyes: EOM are normal. Pupils are equal, round, and reactive to light.  Neck: Normal range of motion. Neck supple.  Cardiovascular: Normal rate and regular rhythm.   Pulmonary/Chest: Effort normal and breath sounds normal. No respiratory distress.  Abdominal: Soft. Bowel sounds are normal.  Musculoskeletal: Normal range of motion. He exhibits no edema.  Neurological: He is alert. No cranial nerve deficit. He exhibits normal muscle tone. Coordination normal.  Skin: Skin is warm.  Nursing note and vitals reviewed.    ED Treatments / Results  Labs (all labs ordered are listed, but only abnormal results  are displayed) Labs Reviewed  BASIC METABOLIC PANEL - Abnormal; Notable for the following:       Result Value   CO2 21 (*)    Glucose, Bld 202 (*)    BUN 38 (*)    Creatinine, Ser 1.90 (*)    GFR calc non Af Amer 30 (*)    GFR calc Af Amer 35 (*)    All other components within normal limits  CBC - Abnormal; Notable for the following:    RBC 3.84 (*)    Hemoglobin 9.3 (*)    HCT 29.8 (*)    MCV 77.6 (*)    MCH 24.2 (*)    RDW 16.0 (*)    All other components within normal limits  I-STAT TROPOININ, ED  I-STAT TROPOININ, ED   Results for orders placed or performed during the hospital encounter of 04/25/16  Basic metabolic panel  Result Value Ref Range   Sodium 138 135 - 145 mmol/L   Potassium 3.8 3.5 - 5.1 mmol/L   Chloride 106 101 - 111 mmol/L   CO2 21 (L) 22 - 32 mmol/L   Glucose, Bld 202 (H) 65 - 99 mg/dL   BUN 38 (H) 6 - 20 mg/dL   Creatinine, Ser 1.61 (H) 0.61 - 1.24 mg/dL   Calcium 9.4 8.9 - 09.6 mg/dL   GFR calc non Af Amer 30 (L) >60 mL/min   GFR calc Af Amer 35  (L) >60 mL/min   Anion gap 11 5 - 15  CBC  Result Value Ref Range   WBC 7.0 4.0 - 10.5 K/uL   RBC 3.84 (L) 4.22 - 5.81 MIL/uL   Hemoglobin 9.3 (L) 13.0 - 17.0 g/dL   HCT 04.5 (L) 40.9 - 81.1 %   MCV 77.6 (L) 78.0 - 100.0 fL   MCH 24.2 (L) 26.0 - 34.0 pg   MCHC 31.2 30.0 - 36.0 g/dL   RDW 91.4 (H) 78.2 - 95.6 %   Platelets 182 150 - 400 K/uL  I-stat troponin, ED  Result Value Ref Range   Troponin i, poc 0.03 0.00 - 0.08 ng/mL   Comment 3          I-stat troponin, ED  Result Value Ref Range   Troponin i, poc 0.02 0.00 - 0.08 ng/mL   Comment 3             EKG  EKG Interpretation  Date/Time:  Wednesday April 25 2016 09:38:31 EDT Ventricular Rate:  81 PR Interval:    QRS Duration: 140 QT Interval:  412 QTC Calculation: 376 R Axis:   -40 Text Interpretation:  Atrial fibrillation Ventricular bigeminy Aberrant complex Left bundle branch block Confirmed by Eliese Kerwood  MD, Lorin Picket ([id number]) on 04/25/2016 9:45:47 AM Also confirmed by Deretha Emory  MD, Corin Tilly [phone]), editor Valentina Lucks CT, Jola Babinski [phone])  on 04/25/2016 10:09:02 AM       Radiology Dg Chest 2 View  Result Date: 04/25/2016 CLINICAL DATA:  Chest pain. EXAM: CHEST  2 VIEW COMPARISON:  02/28/2016. FINDINGS: Trachea is midline. Heart is enlarged, stable. Left subclavian pacemaker lead tip is in the right ventricle. Mild diffuse interstitial prominence is unchanged. Mild scarring in the medial aspects of both lower hemithoraces. No airspace consolidation or pleural fluid. Lungs are low in volume. IMPRESSION: No acute findings. Electronically Signed   By: Leanna Battles M.D.   On: 04/25/2016 10:16    Procedures Procedures (including critical care time)  Medications Ordered in ED Medications - No data to display  Initial Impression / Assessment and Plan / ED Course  I have reviewed the triage vital signs and the nursing notes.  Pertinent labs & imaging results that were available during my care of the patient were reviewed  by me and considered in my medical decision making (see chart for details).  Clinical Course    Patient without complaint of chest pain however nursing facility was concerned about that. Troponins 2 are negative. EKG without acute changes chest x-ray negative. Labs without significant abnormalities. Patient stable for discharge back to nursing facility.  Final Clinical Impressions(s) / ED Diagnoses   Final diagnoses:  Chest pain, unspecified type    New Prescriptions New Prescriptions   No medications on file     Vanetta Mulders, MD 04/25/16 1354    Vanetta Mulders, MD 04/25/16 1357

## 2016-04-25 NOTE — ED Notes (Signed)
Family updated on patient via interpreter.

## 2016-04-25 NOTE — ED Notes (Signed)
Pt given water per MD approval

## 2016-05-01 NOTE — Progress Notes (Signed)
Cardiology Office Note Date:  05/02/2016  Patient ID:  Harold Jones, Harold Jones [birth year], MRN [id number] PCP:  Mortimer Fries, PA  Cardiologist:  Dr. Ladona Ridgel   Chief Complaint: PPM check  History of Present Illness: Harold Jones is a 82 y.o. male with history of aortic valve disease status post previous bioprosthetic valve as well as repair of a thoracic aneurysm,  PPM 08/2015 for symptomatic bradycardia, chronic atrial fibrillation not on anticoagulation due to recent GI bleed requiring transfusion, CVA, diabetes, CRI, hypertension, hyperlipidemia and vascular dementia who lives at [facility].  Admitted with a GI bleed 12/2015 with positive stool and hemoglobin down to 6.8 on low-dose Xarelto 15 mg for chronic A. fib. Repeat echo during this admission shows elevated gradient for aortic stenosis. Advise to hold anticoagulation indefinitely given age and significant bleeding by Dr. Eden Emms.  Hospitalization August 2017 with c/o CP treated conservatively/medically for any CAD and his AV disease given GIB history.  More recently an ER visit 04/26/16, via EMS from his ALF with c/o CP, he was noted to have DNR status on file, and given managed conservatively in August, 2 negative POC troponins and no ongoing complaints discharged from the ER.  He comes in today to be seen for Dr. Ladona Ridgel, he is from [place], speaks no English and the [employer] translation service is used, [place] [phone].  The patient is accompanied by his daughter ([place]peaks on [place] as well), and a personal/driver from the ALF.  The ALF personal called in and confirmed he remains DNR status.  The patient tells [place] that he continues to occassionally feels the same pressure in his chest, though after a fairly lengthy discussion, this appears unchanged, no escalation at all, and he ultimately states he feels he needs more time to get used to living in his new home and without his wife. No reports of fainting or near fainting, he will feel a little SOB on  occasion.   DEVICE information: BSci single lead PPM, implanted 08/16/15, Dr. Ladona Ridgel, [facility]  Past Medical History:  Diagnosis Date  . Aortic valve disorder   . BPH without obstruction/lower urinary tract symptoms   . Chest wall pain following surgery   . COGNITIVE DEFICITS DUE CEREBROVASCULAR DISEASE 09/09/2009   Qualifier: Diagnosis of  By: Delrae Alfred MD, Lanora Manis    . CORONARY ARTERY DISEASE 02/27/2007   Qualifier: Diagnosis of  By: Barbaraann Barthel MD, [place]    . CVA 02/28/2004   Annotation: embolic Qualifier: Diagnosis of  By: Barbaraann Barthel MD, [place]    . Dementia    Per paper work from [employer]  . Diabetes mellitus   . DYSLIPIDEMIA 02/27/2007   Qualifier: Diagnosis of  By: Barbaraann Barthel MD, [place]    . FIBRILLATION, ATRIAL 08/31/2002   Qualifier: Diagnosis of  By: Barbaraann Barthel MD, [place]    . Grief   . Hypertension   . Neuropathic pain   . Osteoarthritis of left knee   . Physical deconditioning   . Symptomatic bradycardia 08/16/15   [employer] PPM, Dr. Ladona Ridgel    Past Surgical History:  Procedure Laterality Date  . CARDIAC SURGERY     bioprosthetic AV replacement  . EP IMPLANTABLE DEVICE N/A 08/16/2015   [facility] Scientific PPM, Dr. Ladona Ridgel  . ESOPHAGOGASTRODUODENOSCOPY (EGD) WITH PROPOFOL N/A 01/17/2016   Procedure: ESOPHAGOGASTRODUODENOSCOPY (EGD) WITH PROPOFOL;  Surgeon: Bernette Redbird, MD;  Location: [facility] ENDOSCOPY;  Service: Endoscopy;  Laterality: N/A;    Current Outpatient Prescriptions  Medication Sig Dispense Refill  . acetaminophen (MAPAP) 325 MG tablet  Take 650 mg by mouth 2 (two) times daily. NOT TO EXCEED 3,000 MG/DAY FROM ALL (COMBINED) SOURCES    . aspirin 81 MG chewable tablet Chew 1 tablet (81 mg total) by mouth daily.    Marland Kitchen. atorvastatin (LIPITOR) 10 MG tablet Take 10 mg by mouth daily.    Marland Kitchen. docusate sodium (COLACE) 100 MG capsule Take 100 mg by mouth 2 (two) times daily.    . furosemide (LASIX) 20 MG tablet Take 2 tablets (40 mg total) by mouth daily. Reported on  08/17/2015    . gabapentin (NEURONTIN) 300 MG capsule Take 300 mg by mouth 2 (two) times daily.    Marland Kitchen. glipiZIDE (GLUCOTROL) 5 MG tablet Take 5 mg by mouth daily before breakfast.     . hydrALAZINE (APRESOLINE) 25 MG tablet Take 1 tablet (25 mg total) by mouth 2 (two) times daily. 60 tablet 0  . pantoprazole (PROTONIX) 40 MG tablet Take 1 tablet (40 mg total) by mouth daily. 40 tablet 0  . polyethylene glycol (MIRALAX / GLYCOLAX) packet Take 17 g by mouth daily. Mix with 8 ounces of liquid/Hold for loose stools    . tamsulosin (FLOMAX) 0.4 MG CAPS capsule Take 0.4 mg by mouth every morning.   3  . traMADol (ULTRAM) 50 MG tablet Take 50 mg by mouth 2 (two) times daily.      No current facility-administered medications for this visit.     Allergies:   Review of patient's allergies indicates no known allergies.   Social History:  The patient  reports that he has never smoked. He has never used smokeless tobacco. He reports that he does not drink alcohol or use drugs.   Family History:  The patient's family history includes CAD in his brother and father; Other in his mother.  ROS:  Please see the history of present illness.   All other systems are reviewed and otherwise negative.   PHYSICAL EXAM:  VS:  BP 128/60   Pulse 60   Ht 6' (1.829 m)   Wt 197 lb (89.4 kg)   BMI 26.72 kg/m  BMI: Body mass index is 26.72 kg/m. Well nourished, well developed, elderly male, in no acute distress  HEENT: normocephalic, atraumatic  Neck: no JVD, carotid bruits or masses Cardiac: IRRR; 2+ SM, no rubs, or gallops Lungs:  clear to auscultation bilaterally, no wheezing, rhonchi or rales  Abd: soft, nontender MS: no deformity, age appropriate  atrophy Ext: no edema  Skin: warm and dry, no rash Neuro:  No gross deficits appreciated Psych: euthymic mood, full affect  PPM site is stable, no tethering or discomfort   EKG:  Done 04/25/16, reviewed today by myself showed AFib, 81bpm, some V paced beats PPM  interrogation today and reviewed by myself: normal device function, batter/lead status is stable  01/17/16: TTE Study Conclusions - Left ventricle: The cavity size was normal. There was moderate   concentric hypertrophy. Systolic function was normal. The   estimated ejection fraction was in the range of 60% to 65%. Wall   motion was normal; there were no regional wall motion   abnormalities. The study was not technically sufficient to allow   evaluation of LV diastolic dysfunction due to atrial   fibrillation. - Aortic valve: Inadequately visualized bioprosthetic AVR to   comment on structure, but by Doppler appears to be moderate AS   with mild perivalvular AI. There was mild stenosis. There was   moderate regurgitation. - Mitral valve: There was mild regurgitation. -  Left atrium: The atrium was severely dilated. - Right atrium: The atrium was severely dilated. - Tricuspid valve: There was moderate regurgitation. - Pulmonary arteries: PA peak pressure: 59 mm Hg (S). Impressions: - The right ventricular systolic pressure was increased consistent   with moderate pulmonary hypertension.  Recent Labs: 02/27/2016: ALT 12; B Natriuretic Peptide 203.5; TSH 0.227 04/25/2016: BUN 38; Creatinine, Ser 1.90; Hemoglobin 9.3; Platelets 182; Potassium 3.8; Sodium 138  No results found for requested labs within last 8760 hours.   Estimated Creatinine Clearance: 30.6 mL/min (by C-G formula based on SCr of 1.9 mg/dL (H)).   Wt Readings from Last 3 Encounters:  05/02/16 197 lb (89.4 kg)  04/25/16 180 lb (81.6 kg)  02/29/16 187 lb 3.2 oz (84.9 kg)     Other studies reviewed: Additional studies/records reviewed today include: summarized above  ASSESSMENT AND PLAN:  1. CHB w/PPM    normal device function, no changes made    3 month remote check  2. VHD hx of Bioprosthetic AVR     Echo as noted     No further intervention recommended at last hospital stay  3. Permanent AFib     CHA2DS2Vasc  is at least 7, no longer on a/c secondary to severe GIB  4. HTN     stable  5. Dementia     Lives at SNF     carries DNR status per hospital record, ALF employee confirms though form is not on hand today  6. CP     not an active complaint today     conservative management after a number of hospital stays and given severe GIB, has been recommended     Patient today mentions he feels he needs more time to get used to his new home and without his wife   Disposition: F/u with Dr. Ladona Ridgelaylor in 6 months, sooner if needed  Current medicines are reviewed at length with the patient today.  The patient did not have any concerns regarding medicines.  Judith BlonderSigned, Euriah Matlack Ursy, PA-C 05/02/2016 12:36 PM     [facility] [address] [place] [address] [phone] (office)  [phone] (fax)

## 2016-05-02 ENCOUNTER — Encounter: Payer: Self-pay | Admitting: Physician Assistant

## 2016-05-02 ENCOUNTER — Ambulatory Visit (INDEPENDENT_AMBULATORY_CARE_PROVIDER_SITE_OTHER): Payer: Medicare Other | Admitting: Physician Assistant

## 2016-05-02 VITALS — BP 128/60 | HR 60 | Ht 72.0 in | Wt 197.0 lb

## 2016-05-02 DIAGNOSIS — I208 Other forms of angina pectoris: Secondary | ICD-10-CM

## 2016-05-02 DIAGNOSIS — I482 Chronic atrial fibrillation: Secondary | ICD-10-CM | POA: Diagnosis not present

## 2016-05-02 DIAGNOSIS — I2510 Atherosclerotic heart disease of native coronary artery without angina pectoris: Secondary | ICD-10-CM

## 2016-05-02 DIAGNOSIS — I350 Nonrheumatic aortic (valve) stenosis: Secondary | ICD-10-CM

## 2016-05-02 DIAGNOSIS — I10 Essential (primary) hypertension: Secondary | ICD-10-CM | POA: Diagnosis not present

## 2016-05-02 DIAGNOSIS — I442 Atrioventricular block, complete: Secondary | ICD-10-CM | POA: Diagnosis not present

## 2016-05-02 DIAGNOSIS — I209 Angina pectoris, unspecified: Secondary | ICD-10-CM

## 2016-05-02 DIAGNOSIS — I4821 Permanent atrial fibrillation: Secondary | ICD-10-CM

## 2016-05-02 NOTE — Patient Instructions (Addendum)
Medication Instructions:   Your physician recommends that you continue on your current medications as directed. Please refer to the Current Medication list given to you today.   If you need a refill on your cardiac medications before your next appointment, please call your pharmacy.  Labwork: NONE ORDERED  TODAY    Testing/Procedures: NONE ORDERED  TODAY    Follow-Up: Your physician wants you to follow-up in:  IN  6  MONTHS WITH DR TAYLOR   You will receive a reminder letter in the mail two months in advance. If you don't receive a letter, please call our office to schedule the follow-up appointment.      Any Other Special Instructions Will Be Listed Below (If Applicable).                                                                                                                                                   

## 2016-05-08 LAB — CUP PACEART INCLINIC DEVICE CHECK
Date Time Interrogation Session: 20171101040000
Implantable Lead Implant Date: 20170214
Implantable Lead Location: 753860
Implantable Lead Model: 7742
Implantable Lead Serial Number: 699159
Lead Channel Impedance Value: 983 Ohm
Lead Channel Setting Sensing Sensitivity: 2.5 mV
MDC IDC MSMT LEADCHNL RV SENSING INTR AMPL: 18.5 mV
MDC IDC PG IMPLANT DT: 20170214
MDC IDC PG SERIAL: 714375
MDC IDC SET LEADCHNL RV PACING AMPLITUDE: 1 V
MDC IDC SET LEADCHNL RV PACING PULSEWIDTH: 0.4 ms

## 2016-07-09 ENCOUNTER — Emergency Department (HOSPITAL_COMMUNITY)
Admission: EM | Admit: 2016-07-09 | Discharge: 2016-07-09 | Disposition: A | Discharge status: Skilled Nursing Facility | Payer: Medicare Other | Attending: Emergency Medicine | Admitting: Emergency Medicine

## 2016-07-09 ENCOUNTER — Encounter (HOSPITAL_COMMUNITY): Payer: Self-pay | Admitting: Emergency Medicine

## 2016-07-09 DIAGNOSIS — Z79899 Other long term (current) drug therapy: Secondary | ICD-10-CM | POA: Diagnosis not present

## 2016-07-09 DIAGNOSIS — Y939 Activity, unspecified: Secondary | ICD-10-CM | POA: Diagnosis not present

## 2016-07-09 DIAGNOSIS — X58XXXA Exposure to other specified factors, initial encounter: Secondary | ICD-10-CM | POA: Diagnosis not present

## 2016-07-09 DIAGNOSIS — E119 Type 2 diabetes mellitus without complications: Secondary | ICD-10-CM | POA: Diagnosis not present

## 2016-07-09 DIAGNOSIS — I509 Heart failure, unspecified: Secondary | ICD-10-CM | POA: Diagnosis not present

## 2016-07-09 DIAGNOSIS — I2510 Atherosclerotic heart disease of native coronary artery without angina pectoris: Secondary | ICD-10-CM | POA: Insufficient documentation

## 2016-07-09 DIAGNOSIS — Y999 Unspecified external cause status: Secondary | ICD-10-CM | POA: Diagnosis not present

## 2016-07-09 DIAGNOSIS — Z7984 Long term (current) use of oral hypoglycemic drugs: Secondary | ICD-10-CM | POA: Diagnosis not present

## 2016-07-09 DIAGNOSIS — Y9289 Other specified places as the place of occurrence of the external cause: Secondary | ICD-10-CM | POA: Insufficient documentation

## 2016-07-09 DIAGNOSIS — Z7982 Long term (current) use of aspirin: Secondary | ICD-10-CM | POA: Diagnosis not present

## 2016-07-09 DIAGNOSIS — S0992XA Unspecified injury of nose, initial encounter: Secondary | ICD-10-CM | POA: Diagnosis present

## 2016-07-09 DIAGNOSIS — S0121XA Laceration without foreign body of nose, initial encounter: Secondary | ICD-10-CM | POA: Insufficient documentation

## 2016-07-09 DIAGNOSIS — Z8673 Personal history of transient ischemic attack (TIA), and cerebral infarction without residual deficits: Secondary | ICD-10-CM | POA: Insufficient documentation

## 2016-07-09 DIAGNOSIS — I110 Hypertensive heart disease with heart failure: Secondary | ICD-10-CM | POA: Diagnosis not present

## 2016-07-09 NOTE — ED Triage Notes (Signed)
Brought in by EMS from [facility] with c/o nose bleeding.  Pt not on any blood thinners or anti-coagulants.

## 2016-07-09 NOTE — ED Provider Notes (Addendum)
[facility]-EMERGENCY DEPT Provider Note   CSN: [id number] Arrival date & time: 07/09/16  0533     History   Chief Complaint Chief Complaint  Patient presents with  . Epistaxis    HPI Harold Jones is a 83 y.o. male.  Patient sent to the emergency department for evaluation of nosebleed. Patient had sudden onset of bleeding from the right side of his nose prior to arrival in the ER. He arrives by ambulance. Patient comes from a skilled nursing facility. Facility was uncertain what language the patient speaks. Level V Caveat due to language barrier.      Past Medical History:  Diagnosis Date  . Aortic valve disorder   . BPH without obstruction/lower urinary tract symptoms   . Chest wall pain following surgery   . COGNITIVE DEFICITS DUE CEREBROVASCULAR DISEASE 09/09/2009   Qualifier: Diagnosis of  By: Delrae Alfred MD, Lanora Manis    . CORONARY ARTERY DISEASE 02/27/2007   Qualifier: Diagnosis of  By: Barbaraann Barthel MD, [place]    . CVA 02/28/2004   Annotation: embolic Qualifier: Diagnosis of  By: Barbaraann Barthel MD, [place]    . Dementia    Per paper work from [employer]  . Diabetes mellitus   . DYSLIPIDEMIA 02/27/2007   Qualifier: Diagnosis of  By: Barbaraann Barthel MD, [place]    . FIBRILLATION, ATRIAL 08/31/2002   Qualifier: Diagnosis of  By: Barbaraann Barthel MD, [place]    . Grief   . Hypertension   . Neuropathic pain   . Osteoarthritis of left knee   . Physical deconditioning   . Symptomatic bradycardia 08/16/15   [employer] PPM, Dr. Ladona Ridgel    Patient Active Problem List   Diagnosis Date Noted  . CHF exacerbation (HCC) 02/27/2016  . Altered mental state   . Confusion   . Acute GI bleeding   . GI bleed 01/12/2016  . GIB (gastrointestinal bleeding) 01/12/2016  . Symptomatic bradycardia 08/15/2015  . Intraventricular conduction delay 08/15/2015  . S/P aortic valve replacement with bioprosthetic valve 08/15/2015  . Atrial fibrillation with slow ventricular response (HCC) 08/15/2015  . Severe  sepsis (HCC) 05/30/2015  . Acute respiratory failure (HCC) 05/30/2015  . History of stroke 03/15/2015  . Benign hypertensive heart disease without heart failure 03/08/2015  . 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 BPH without obstruction/lower urinary tract symptoms 03/08/2015  . Post herpetic neuralgia 03/08/2015  . CAP (community acquired pneumonia) 03/01/2015  . Syncope and collapse 11/12/2013  . Nasal fracture 11/12/2013  . Encephalopathy acute 06/23/2013  . Diabetes mellitus, type 2 (HCC) 06/23/2013  . Hypoglycemia 06/23/2013  . Supratherapeutic INR 06/23/2013  . Zoster 10/12/2011  . Altered mental status 10/08/2011  . GLAUCOMA 09/08/2010  . KNEE PAIN, LEFT 11/22/2009  . 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 CAROTID BRUIT, RIGHT 01/18/2009  . GASTROINTESTINAL HEMORRHAGE 09/26/2007  . OSTEOARTHRITIS 03/18/2007  . Dyslipidemia 02/27/2007  . Coronary atherosclerosis 02/27/2007  . Essential hypertension 02/25/2007  . Cerebral artery occlusion with cerebral infarction (HCC) 02/28/2004  . ASCENDING AORTIC ANEURYSM 02/28/2004  . H/O aortic valve replacement 02/28/2004  . FIBRILLATION, ATRIAL 08/31/2002    Past Surgical History:  Procedure Laterality Date  . CARDIAC SURGERY     bioprosthetic AV replacement  . EP IMPLANTABLE DEVICE N/A 08/16/2015   [facility] Scientific PPM, Dr. Ladona Ridgel  . ESOPHAGOGASTRODUODENOSCOPY (EGD) WITH PROPOFOL N/A 01/17/2016   Procedure: ESOPHAGOGASTRODUODENOSCOPY (EGD) WITH PROPOFOL;  Surgeon: Bernette Redbird, MD;  Location: [facility] ENDOSCOPY;  Service: Endoscopy;  Laterality: N/A;       Home Medications    Prior to Admission medications   Medication Sig Start Date End Date Taking? Authorizing Provider  acetaminophen (MAPAP) 325 MG tablet Take 650 mg by mouth 2 (two) times daily. NOT TO EXCEED 3,000 MG/DAY FROM ALL (COMBINED) SOURCES    Historical Provider, MD  aspirin 81 MG  chewable tablet Chew 1 tablet (81 mg total) by mouth daily. 02/29/16   Shanker Levora DredgeM Ghimire, MD  atorvastatin (LIPITOR) 10 MG tablet Take 10 mg by mouth daily.    Historical Provider, MD  docusate sodium (COLACE) 100 MG capsule Take 100 mg by mouth 2 (two) times daily.    Historical Provider, MD  furosemide (LASIX) 20 MG tablet Take 2 tablets (40 mg total) by mouth daily. Reported on 08/17/2015 03/01/16   Maretta BeesShanker M Ghimire, MD  gabapentin (NEURONTIN) 300 MG capsule Take 300 mg by mouth 2 (two) times daily.    Historical Provider, MD  glipiZIDE (GLUCOTROL) 5 MG tablet Take 5 mg by mouth daily before breakfast.  11/14/13   Esperanza SheetsUlugbek N Buriev, MD  hydrALAZINE (APRESOLINE) 25 MG tablet Take 1 tablet (25 mg total) by mouth 2 (two) times daily. 02/29/16   Shanker Levora DredgeM Ghimire, MD  pantoprazole (PROTONIX) 40 MG tablet Take 1 tablet (40 mg total) by mouth daily. 01/19/16   Richarda OverlieNayana Abrol, MD  polyethylene glycol (MIRALAX / GLYCOLAX) packet Take 17 g by mouth daily. Mix with 8 ounces of liquid/Hold for loose stools    Historical Provider, MD  tamsulosin (FLOMAX) 0.4 MG CAPS capsule Take 0.4 mg by mouth every morning.  02/12/15   Historical Provider, MD  traMADol (ULTRAM) 50 MG tablet Take 50 mg by mouth 2 (two) times daily.     Historical Provider, MD    Family History Family History  Problem Relation Age of Onset  . Other Mother     old age  . CAD Father   . CAD Brother     Social History Social History  Substance Use Topics  . Smoking status: Never Smoker  . Smokeless tobacco: Never Used  . Alcohol use No     Allergies   Patient has no known allergies.   Review of Systems Review of Systems  Unable to perform ROS: Other     Physical Exam Updated Vital Signs BP 156/62 (BP Location: Right Arm)   Pulse (!) 56   Temp 98 F (36.7 C) (Oral)   Resp 18   SpO2 97%   Physical Exam  Constitutional: He is oriented to person, place, and time. He appears well-developed and well-nourished. No distress.    HENT:  Head: Normocephalic and atraumatic.  Right Ear: Hearing normal.  Left Ear: Hearing normal.  Nose: Nose lacerations (1.345mm linear laceration just below entrance to the right nare, actively bleeding) present.  Mouth/Throat: Oropharynx is clear and moist and mucous membranes are normal.  Eyes: Conjunctivae and EOM are normal. Pupils are equal, round, and reactive to light.  Neck: Normal range of motion. Neck supple.  Cardiovascular: Regular rhythm, S1 normal and S2 normal.  Exam reveals no gallop and no friction rub.   No murmur heard. Pulmonary/Chest: Effort normal and breath sounds normal. No respiratory distress. He exhibits no tenderness.  Abdominal: Soft. Normal appearance and bowel sounds are normal. There is no hepatosplenomegaly. There is no tenderness. There is no rebound, no guarding, no tenderness at McBurney's point and negative Murphy's sign. No hernia.  Musculoskeletal: Normal range of motion.  Neurological: He is alert and oriented to person, place, and time. He has normal strength. No cranial nerve deficit or sensory deficit. Coordination normal. GCS eye subscore is 4. GCS verbal subscore is 5. GCS motor subscore is 6.  Skin: Skin is warm, dry and intact. No rash noted. No cyanosis.  Psychiatric: He has a normal mood and affect. His speech is normal and behavior is normal. Thought content normal.  Nursing note and vitals reviewed.    ED Treatments / Results  Labs (all labs ordered are listed, but only abnormal results are displayed) Labs Reviewed - No data to display  EKG  EKG Interpretation None       Radiology No results found.  Procedures Procedures (including critical care time)  Medications Ordered in ED Medications - No data to display   Initial Impression / Assessment and Plan / ED Course  I have reviewed the triage vital signs and the nursing notes.  Pertinent labs & imaging results that were available during my care of the patient were  reviewed by me and considered in my medical decision making (see chart for details).  Clinical Course    Patient presented initially with concerns over epistaxis, however evaluation revealed that he was bleeding from a tiny laceration just below the nose. This was treated with wound seal hemostatic applicator with immediate hemostasis achieved. Patient monitored with no further bleeding. Patient appropriate for return to nursing facility.  Final Clinical Impressions(s) / ED Diagnoses   Final diagnoses:  Laceration of nose, initial encounter    New Prescriptions New Prescriptions   No medications on file     Gilda Crease, MD 07/09/16 [id number]    Gilda Crease, MD 07/09/16 [phone]

## 2016-07-09 NOTE — ED Notes (Signed)
Bed: [id number] Expected date:  Expected time:  Means of arrival:  Comments: 83 yo M/ nose bleed

## 2016-07-09 NOTE — ED Notes (Signed)
[facility] here to transport pt back to [facility]. 

## 2016-07-09 NOTE — ED Notes (Signed)
[facility] was called and notified of pt's need of trans[portation back to [facility].

## 2016-07-09 NOTE — ED Notes (Signed)
Staff at [facility] was called and given report on pt's condition and discharge.

## 2016-07-18 ENCOUNTER — Emergency Department (HOSPITAL_COMMUNITY)
Admission: EM | Admit: 2016-07-18 | Discharge: 2016-07-18 | Disposition: A | Discharge status: Home / Self Care | Payer: Medicare Other | Attending: Emergency Medicine | Admitting: Emergency Medicine

## 2016-07-18 ENCOUNTER — Encounter (HOSPITAL_COMMUNITY): Payer: Self-pay | Admitting: *Deleted

## 2016-07-18 ENCOUNTER — Emergency Department (HOSPITAL_COMMUNITY): Payer: Medicare Other

## 2016-07-18 DIAGNOSIS — Z79899 Other long term (current) drug therapy: Secondary | ICD-10-CM | POA: Diagnosis not present

## 2016-07-18 DIAGNOSIS — E1140 Type 2 diabetes mellitus with diabetic neuropathy, unspecified: Secondary | ICD-10-CM | POA: Diagnosis not present

## 2016-07-18 DIAGNOSIS — R0789 Other chest pain: Secondary | ICD-10-CM | POA: Diagnosis not present

## 2016-07-18 DIAGNOSIS — Z8673 Personal history of transient ischemic attack (TIA), and cerebral infarction without residual deficits: Secondary | ICD-10-CM | POA: Diagnosis not present

## 2016-07-18 DIAGNOSIS — I110 Hypertensive heart disease with heart failure: Secondary | ICD-10-CM | POA: Insufficient documentation

## 2016-07-18 DIAGNOSIS — Z7984 Long term (current) use of oral hypoglycemic drugs: Secondary | ICD-10-CM | POA: Insufficient documentation

## 2016-07-18 DIAGNOSIS — Z7982 Long term (current) use of aspirin: Secondary | ICD-10-CM | POA: Insufficient documentation

## 2016-07-18 DIAGNOSIS — I2510 Atherosclerotic heart disease of native coronary artery without angina pectoris: Secondary | ICD-10-CM | POA: Diagnosis not present

## 2016-07-18 DIAGNOSIS — R079 Chest pain, unspecified: Secondary | ICD-10-CM | POA: Diagnosis present

## 2016-07-18 DIAGNOSIS — I509 Heart failure, unspecified: Secondary | ICD-10-CM | POA: Insufficient documentation

## 2016-07-18 LAB — TROPONIN I: Troponin I: <0.03 ng/mL (ref <0.03)

## 2016-07-18 LAB — CBC
HCT: 30.1 % — ABNORMAL LOW (ref 39.0–52.0)
Hemoglobin: 9.3 g/dL — ABNORMAL LOW (ref 13.0–17.0)
MCH: 24 pg — AB (ref 26.0–34.0)
MCHC: 30.9 g/dL (ref 30.0–36.0)
MCV: 77.6 fL — AB (ref 78.0–100.0)
PLATELETS: 183 10*3/uL (ref 150–400)
RBC: 3.88 MIL/uL — AB (ref 4.22–5.81)
RDW: 15.8 % — ABNORMAL HIGH (ref 11.5–15.5)
WBC: 5.2 10*3/uL (ref 4.0–10.5)

## 2016-07-18 LAB — PROTIME-INR
INR: 1.18
PROTHROMBIN TIME: 15 s (ref 11.4–15.2)

## 2016-07-18 LAB — BASIC METABOLIC PANEL
Anion gap: 10 (ref 5–15)
BUN: 34 mg/dL — ABNORMAL HIGH (ref 6–20)
CALCIUM: 9.4 mg/dL (ref 8.9–10.3)
CHLORIDE: 105 mmol/L (ref 101–111)
CO2: 24 mmol/L (ref 22–32)
CREATININE: 1.73 mg/dL — AB (ref 0.61–1.24)
GFR calc non Af Amer: 34 mL/min — ABNORMAL LOW (ref >60)
GFR, EST AFRICAN AMERICAN: 39 mL/min — AB (ref >60)
GLUCOSE: 141 mg/dL — AB (ref 65–99)
Potassium: 3.9 mmol/L (ref 3.5–5.1)
Sodium: 139 mmol/L (ref 135–145)

## 2016-07-18 LAB — I-STAT TROPONIN, ED: Troponin i, poc: 0.01 ng/mL (ref 0.00–0.08)

## 2016-07-18 NOTE — ED Notes (Signed)
Patient still waiting on [facility] to transport , patient updated , supper tray ordered.

## 2016-07-18 NOTE — ED Triage Notes (Signed)
Patient presents to ed via [facility]  From nursing [facility]. States patient came up to nursing station and was holding his chest . Patient doesn't speak english

## 2016-07-18 NOTE — ED Notes (Signed)
[facility] called to return patient back

## 2016-07-18 NOTE — ED Notes (Signed)
Called and spoke with Marylene LandAngela at [facility] aware patient will be returning.

## 2016-07-18 NOTE — ED Provider Notes (Signed)
[facility]PT Provider Note   CSN: [id number] Arrival date & time: 07/18/16  1102   By signing my name below, I, Freida Busman, attest that this documentation has been prepared under the direction and in the presence of Gerhard Munch, MD . Electronically Signed: Freida Busman, Scribe. 07/18/2016. 12:32 PM.  History   Chief Complaint Chief Complaint  Patient presents with  . Chest Pain   LEVEL 5 CAVEAT DUE TO Language barrier and dementia  The history is provided by the patient. The history is limited by a language barrier. A language interpreter was used Art gallery manager).     HPI Comments:  Umer [place]aca is a 83 y.o. male, with a history of Dementia, DM, HTN, CHF, and AFIB, who presents to the Emergency Department via EMS. Pt is not able to say why he is here today. He states to interpreter that he needs to get in touch with his family; states his daughter and grandchildren are in this country. He denies pain at this time. Per triage note, pt arrives from a nursing facility after being seen holding his chest.    Past Medical History:  Diagnosis Date  . Aortic valve disorder   . BPH without obstruction/lower urinary tract symptoms   . Chest wall pain following surgery   . COGNITIVE DEFICITS DUE CEREBROVASCULAR DISEASE 09/09/2009   Qualifier: Diagnosis of  By: Delrae Alfred MD, Lanora Manis    . CORONARY ARTERY DISEASE 02/27/2007   Qualifier: Diagnosis of  By: Barbaraann Barthel MD, [place]    . CVA 02/28/2004   Annotation: embolic Qualifier: Diagnosis of  By: Barbaraann Barthel MD, [place]    . Dementia    Per paper work from [employer]  . Diabetes mellitus   . DYSLIPIDEMIA 02/27/2007   Qualifier: Diagnosis of  By: Barbaraann Barthel MD, [place]    . FIBRILLATION, ATRIAL 08/31/2002   Qualifier: Diagnosis of  By: Barbaraann Barthel MD, [place]    . Grief   . Hypertension   . Neuropathic pain   . Osteoarthritis of left knee   . Physical deconditioning   . Symptomatic bradycardia 08/16/15   [employer] PPM, Dr.  Ladona Ridgel    Patient Active Problem List   Diagnosis Date Noted  . CHF exacerbation (HCC) 02/27/2016  . Altered mental state   . Confusion   . Acute GI bleeding   . GI bleed 01/12/2016  . GIB (gastrointestinal bleeding) 01/12/2016  . Symptomatic bradycardia 08/15/2015  . Intraventricular conduction delay 08/15/2015  . S/P aortic valve replacement with bioprosthetic valve 08/15/2015  . Atrial fibrillation with slow ventricular response (HCC) 08/15/2015  . Severe sepsis (HCC) 05/30/2015  . Acute respiratory failure (HCC) 05/30/2015  . History of stroke 03/15/2015  . Benign hypertensive heart disease without heart failure 03/08/2015  . 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 BPH without obstruction/lower urinary tract symptoms 03/08/2015  . Post herpetic neuralgia 03/08/2015  . CAP (community acquired pneumonia) 03/01/2015  . Syncope and collapse 11/12/2013  . Nasal fracture 11/12/2013  . Encephalopathy acute 06/23/2013  . Diabetes mellitus, type 2 (HCC) 06/23/2013  . Hypoglycemia 06/23/2013  . Supratherapeutic INR 06/23/2013  . Zoster 10/12/2011  . Altered mental status 10/08/2011  . GLAUCOMA 09/08/2010  . KNEE PAIN, LEFT 11/22/2009  . 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 CAROTID BRUIT, RIGHT 01/18/2009  . GASTROINTESTINAL HEMORRHAGE 09/26/2007  . OSTEOARTHRITIS 03/18/2007  . Dyslipidemia 02/27/2007  . Coronary atherosclerosis 02/27/2007  . Essential hypertension 02/25/2007  . Cerebral artery occlusion  with cerebral infarction (HCC) 02/28/2004  . ASCENDING AORTIC ANEURYSM 02/28/2004  . H/O aortic valve replacement 02/28/2004  . FIBRILLATION, ATRIAL 08/31/2002    Past Surgical History:  Procedure Laterality Date  . CARDIAC SURGERY     bioprosthetic AV replacement  . EP IMPLANTABLE DEVICE N/A 08/16/2015   [facility] [employer] PPM, Dr. Ladona Ridgel  . ESOPHAGOGASTRODUODENOSCOPY (EGD) WITH PROPOFOL N/A  01/17/2016   Procedure: ESOPHAGOGASTRODUODENOSCOPY (EGD) WITH PROPOFOL;  Surgeon: Bernette Redbird, MD;  Location: [facility] ENDOSCOPY;  Service: Endoscopy;  Laterality: N/A;       Home Medications    Prior to Admission medications   Medication Sig Start Date End Date Taking? Authorizing Provider  acetaminophen (MAPAP) 325 MG tablet Take 650 mg by mouth 2 (two) times daily. NOT TO EXCEED 3,000 MG/DAY FROM ALL (COMBINED) SOURCES   Yes Historical Provider, MD  aspirin 81 MG chewable tablet Chew 1 tablet (81 mg total) by mouth daily. 02/29/16  Yes Shanker Levora Dredge, MD  atorvastatin (LIPITOR) 10 MG tablet Take 10 mg by mouth daily.   Yes Historical Provider, MD  docusate sodium (COLACE) 100 MG capsule Take 100 mg by mouth 2 (two) times daily.   Yes Historical Provider, MD  furosemide (LASIX) 20 MG tablet Take 2 tablets (40 mg total) by mouth daily. Reported on 08/17/2015 03/01/16  Yes Shanker Levora Dredge, MD  gabapentin (NEURONTIN) 300 MG capsule Take 300 mg by mouth 2 (two) times daily.   Yes Historical Provider, MD  glipiZIDE (GLUCOTROL) 5 MG tablet Take 5 mg by mouth daily before breakfast.  11/14/13  Yes Esperanza Sheets, MD  hydrALAZINE (APRESOLINE) 25 MG tablet Take 1 tablet (25 mg total) by mouth 2 (two) times daily. 02/29/16  Yes Shanker Levora Dredge, MD  pantoprazole (PROTONIX) 40 MG tablet Take 1 tablet (40 mg total) by mouth daily. 01/19/16  Yes Richarda Overlie, MD  polyethylene glycol (MIRALAX / GLYCOLAX) packet Take 17 g by mouth daily. Mix with 8 ounces of liquid/Hold for loose stools   Yes Historical Provider, MD  tamsulosin (FLOMAX) 0.4 MG CAPS capsule Take 0.4 mg by mouth every morning.  02/12/15  Yes Historical Provider, MD  traMADol (ULTRAM) 50 MG tablet Take 50 mg by mouth 2 (two) times daily.    Yes Historical Provider, MD    Family History Family History  Problem Relation Age of Onset  . Other Mother     old age  . CAD Father   . CAD Brother     Social History Social History  Substance  Use Topics  . Smoking status: Never Smoker  . Smokeless tobacco: Never Used  . Alcohol use No     Allergies   Patient has no known allergies.   Review of Systems Review of Systems  Unable to perform ROS: Dementia   Physical Exam Updated Vital Signs BP 115/63 (BP Location: Right Arm)   Pulse (!) 52   Temp 97.7 F (36.5 C) (Oral)   Resp 15   SpO2 94%   Physical Exam  Constitutional: He appears well-developed. No distress.  HENT:  Head: Normocephalic and atraumatic.  Eyes: Conjunctivae and EOM are normal.  Cardiovascular: Normal rate and regular rhythm.   Pulmonary/Chest: Effort normal. No stridor. No respiratory distress.  Sternal surgical scar   Abdominal: He exhibits no distension.  Musculoskeletal: He exhibits no edema.  Neurological: He is alert.  Skin: Skin is warm and dry.  Psychiatric: He has a normal mood and affect.  Nursing note and vitals reviewed.  ED Treatments / Results  DIAGNOSTIC STUDIES:  Oxygen Saturation is 95% on RA, adequate by my interpretation.    COORDINATION OF CARE:  12:28 PM Discussed treatment plan with pt at bedside via interpreter and pt seems to understand and agrees.   Labs (all labs ordered are listed, but only abnormal results are displayed) Labs Reviewed  BASIC METABOLIC PANEL - Abnormal; Notable for the following:       Result Value   Glucose, Bld 141 (*)    BUN 34 (*)    Creatinine, Ser 1.73 (*)    GFR calc non Af Amer 34 (*)    GFR calc Af Amer 39 (*)    All other components within normal limits  CBC - Abnormal; Notable for the following:    RBC 3.88 (*)    Hemoglobin 9.3 (*)    HCT 30.1 (*)    MCV 77.6 (*)    MCH 24.0 (*)    RDW 15.8 (*)    All other components within normal limits  TROPONIN I  PROTIME-INR  I-STAT TROPOININ, ED    EKG  EKG Interpretation  Date/Time:  Wednesday July 18 2016 11:13:58 EST Ventricular Rate:  54 PR Interval:    QRS Duration: 135 QT Interval:  475 QTC  Calculation: 463 R Axis:   -53 Text Interpretation:  Atrial fibrillation Paired ventricular premature complexes Left bundle branch block No significant change since last tracing Confirmed by LITTLE MD, RACHEL ([id number]) on 07/18/2016 11:43:37 AM Also confirmed by LITTLE MD, RACHEL [phone]), editor Valentina Lucks CT, Jola Babinski [phone])  on 07/18/2016 11:59:21 AM       Radiology Dg Chest 2 View  Result Date: 07/18/2016 CLINICAL DATA:  Chest pain.  Atrial fibrillation. EXAM: CHEST  2 VIEW COMPARISON:  04/25/2016 and 02/28/2016 FINDINGS: Stable appearance of the single lead left cardiac pacemaker. Patient has had an aortic valve replacement. Heart size is upper limits of normal but stable. Again noted are prominent lung markings which have not significantly changed and probably represent chronic changes. No large areas of consolidation. The trachea is midline. No large pleural effusions. IMPRESSION: Stable chest radiograph findings with postoperative changes. Prominent lung markings appear to represent chronic changes. Difficult to exclude vascular congestion or mild edema. Electronically Signed   By: Richarda Overlie M.D.   On: 07/18/2016 12:10    Procedures Procedures (including critical care time)  Medications Ordered in ED Medications - No data to display   Initial Impression / Assessment and Plan / ED Course  I have reviewed the triage vital signs and the nursing notes.  Pertinent labs & imaging results that were available during my care of the patient were reviewed by me and considered in my medical decision making (see chart for details).  Clinical Course   on repeat exam the patient is in no distress. I again used the interpreter device, and the patient continues to deny any ongoing complaints.  This elderly male with multiple medical issues including dementia presents with possible concern of chest pain, though here, for several hours he has no ongoing complaints. Via translator, I discussed the patient's  case with him, and subsequently discussed all findings with the patient's family members. Absent evidence for distress, ongoing complaints, and was generally reassuring findings, consistent with patient's prior lab abnormalities, patient was discharged back to his nursing facility.   1:18 PM Spoke with pt's granddaughter, Warren Danes "Lily" [name], who is aware pt is at our facility and would like him to be transferred back  to the assisted living facility when his work up is complete.    Final Clinical Impressions(s) / ED Diagnoses   Final diagnoses:  Atypical chest pain   I personally performed the services described in this documentation, which was scribed in my presence. The recorded information has been reviewed and is accurate.       Gerhard Munchobert Tyreese Thain, MD 07/18/16 [phone]

## 2016-07-18 NOTE — ED Notes (Signed)
Called [facility] for update states it will be a while due to the weather

## 2016-07-18 NOTE — Discharge Instructions (Signed)
As discussed, your evaluation today has been largely reassuring.  But, it is important that you monitor your condition carefully, and do not hesitate to return to the ED if you develop new, or concerning changes in your condition. ? ?Otherwise, please follow-up with your physician for appropriate ongoing care. ? ?

## 2016-07-18 NOTE — ED Notes (Signed)
Patient is resting on stretcher with eyes closed

## 2016-11-20 ENCOUNTER — Encounter (HOSPITAL_COMMUNITY): Payer: Self-pay

## 2016-11-20 ENCOUNTER — Emergency Department (HOSPITAL_COMMUNITY): Payer: Medicare Other

## 2016-11-20 ENCOUNTER — Emergency Department (HOSPITAL_COMMUNITY)
Admission: EM | Admit: 2016-11-20 | Discharge: 2016-11-20 | Disposition: A | Discharge status: Home / Self Care | Payer: Medicare Other | Attending: Emergency Medicine | Admitting: Emergency Medicine

## 2016-11-20 DIAGNOSIS — I509 Heart failure, unspecified: Secondary | ICD-10-CM | POA: Diagnosis not present

## 2016-11-20 DIAGNOSIS — R079 Chest pain, unspecified: Secondary | ICD-10-CM | POA: Diagnosis present

## 2016-11-20 DIAGNOSIS — Z7982 Long term (current) use of aspirin: Secondary | ICD-10-CM | POA: Diagnosis not present

## 2016-11-20 DIAGNOSIS — Z7984 Long term (current) use of oral hypoglycemic drugs: Secondary | ICD-10-CM | POA: Diagnosis not present

## 2016-11-20 DIAGNOSIS — R0789 Other chest pain: Secondary | ICD-10-CM | POA: Insufficient documentation

## 2016-11-20 DIAGNOSIS — E119 Type 2 diabetes mellitus without complications: Secondary | ICD-10-CM | POA: Insufficient documentation

## 2016-11-20 DIAGNOSIS — I2510 Atherosclerotic heart disease of native coronary artery without angina pectoris: Secondary | ICD-10-CM | POA: Insufficient documentation

## 2016-11-20 DIAGNOSIS — I110 Hypertensive heart disease with heart failure: Secondary | ICD-10-CM | POA: Diagnosis not present

## 2016-11-20 DIAGNOSIS — R112 Nausea with vomiting, unspecified: Secondary | ICD-10-CM | POA: Diagnosis not present

## 2016-11-20 DIAGNOSIS — Z8673 Personal history of transient ischemic attack (TIA), and cerebral infarction without residual deficits: Secondary | ICD-10-CM | POA: Insufficient documentation

## 2016-11-20 LAB — CBC
HEMATOCRIT: 28.8 % — AB (ref 39.0–52.0)
HEMOGLOBIN: 8.7 g/dL — AB (ref 13.0–17.0)
MCH: 23.8 pg — ABNORMAL LOW (ref 26.0–34.0)
MCHC: 30.2 g/dL (ref 30.0–36.0)
MCV: 78.7 fL (ref 78.0–100.0)
Platelets: 182 10*3/uL (ref 150–400)
RBC: 3.66 MIL/uL — AB (ref 4.22–5.81)
RDW: 16.1 % — ABNORMAL HIGH (ref 11.5–15.5)
WBC: 5.3 10*3/uL (ref 4.0–10.5)

## 2016-11-20 LAB — BASIC METABOLIC PANEL
ANION GAP: 7 (ref 5–15)
BUN: 27 mg/dL — ABNORMAL HIGH (ref 6–20)
CHLORIDE: 108 mmol/L (ref 101–111)
CO2: 23 mmol/L (ref 22–32)
Calcium: 9.1 mg/dL (ref 8.9–10.3)
Creatinine, Ser: 1.69 mg/dL — ABNORMAL HIGH (ref 0.61–1.24)
GFR calc Af Amer: 40 mL/min — ABNORMAL LOW (ref >60)
GFR calc non Af Amer: 35 mL/min — ABNORMAL LOW (ref >60)
Glucose, Bld: 127 mg/dL — ABNORMAL HIGH (ref 65–99)
POTASSIUM: 4 mmol/L (ref 3.5–5.1)
SODIUM: 138 mmol/L (ref 135–145)

## 2016-11-20 LAB — I-STAT TROPONIN, ED
TROPONIN I, POC: 0.02 ng/mL (ref 0.00–0.08)
Troponin i, poc: 0.02 ng/mL (ref 0.00–0.08)

## 2016-11-20 MED ORDER — IPRATROPIUM-ALBUTEROL 0.5-2.5 (3) MG/3ML IN SOLN
3.0000 mL | Freq: Once | RESPIRATORY_TRACT | Status: AC
  Administered 2016-11-20: 3 mL via RESPIRATORY_TRACT
  Filled 2016-11-20: qty 3

## 2016-11-20 MED ORDER — ONDANSETRON 4 MG PO TBDP
4.0000 mg | ORAL_TABLET | Freq: Once | ORAL | Status: AC
  Administered 2016-11-20: 4 mg via ORAL
  Filled 2016-11-20: qty 1

## 2016-11-20 NOTE — ED Provider Notes (Signed)
[facility] Provider Note   CSN: [id number] Arrival date & time: 11/20/16  1146     History   Chief Complaint Chief Complaint  Patient presents with  . Chest Pain    HPI Harold Jones is a 83 y.o. male.  83 year old male with extensive past medical history including CAD, dementia, atrial fibrillation, hypertension, CHF who presents with chest pain. History very limited due to the fact that the patient speaks [place] and has hearing loss and therefore is unable to understand [place] interpreter. She was brought in by EMS from his nursing facility for an episode of chest pain associated with shortness of breath that began earlier this morning. The patient is unable to describe the chest pain. I later spoke with his daughter and she stated that he told her his episode was mild and he did not have any pain in his arm. He has had a cough.  LEVEL 5 CAVEAT DUE TO HEARING LOSS AND LANGUAGE BARRIER   The history is provided by the patient. The history is limited by a language barrier. A language interpreter was used.  Chest Pain      Past Medical History:  Diagnosis Date  . Aortic valve disorder   . BPH without obstruction/lower urinary tract symptoms   . Chest wall pain following surgery   . COGNITIVE DEFICITS DUE CEREBROVASCULAR DISEASE 09/09/2009   Qualifier: Diagnosis of  By: Delrae Alfred MD, Lanora Manis    . CORONARY ARTERY DISEASE 02/27/2007   Qualifier: Diagnosis of  By: Barbaraann Barthel MD, [place]    . CVA 02/28/2004   Annotation: embolic Qualifier: Diagnosis of  By: Barbaraann Barthel MD, [place]    . Dementia    Per paper work from [employer]  . Diabetes mellitus   . DYSLIPIDEMIA 02/27/2007   Qualifier: Diagnosis of  By: Barbaraann Barthel MD, [place]    . FIBRILLATION, ATRIAL 08/31/2002   Qualifier: Diagnosis of  By: Barbaraann Barthel MD, [place]    . Grief   . Hypertension   . Neuropathic pain   . Osteoarthritis of left knee   . Physical deconditioning   . Symptomatic bradycardia 08/16/15   [employer] PPM, Dr. Ladona Ridgel    Patient Active Problem List   Diagnosis Date Noted  . CHF exacerbation (HCC) 02/27/2016  . Altered mental state   . Confusion   . Acute GI bleeding   . GI bleed 01/12/2016  . GIB (gastrointestinal bleeding) 01/12/2016  . Symptomatic bradycardia 08/15/2015  . Intraventricular conduction delay 08/15/2015  . S/P aortic valve replacement with bioprosthetic valve 08/15/2015  . Atrial fibrillation with slow ventricular response (HCC) 08/15/2015  . Severe sepsis (HCC) 05/30/2015  . Acute respiratory failure (HCC) 05/30/2015  . History of stroke 03/15/2015  . Benign hypertensive heart disease without heart failure 03/08/2015  . 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 BPH without obstruction/lower urinary tract symptoms 03/08/2015  . Post herpetic neuralgia 03/08/2015  . CAP (community acquired pneumonia) 03/01/2015  . Syncope and collapse 11/12/2013  . Nasal fracture 11/12/2013  . Encephalopathy acute 06/23/2013  . Diabetes mellitus, type 2 (HCC) 06/23/2013  . Hypoglycemia 06/23/2013  . Supratherapeutic INR 06/23/2013  . Zoster 10/12/2011  . Altered mental status 10/08/2011  . GLAUCOMA 09/08/2010  . KNEE PAIN, LEFT 11/22/2009  . 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 CAROTID BRUIT, RIGHT 01/18/2009  . GASTROINTESTINAL HEMORRHAGE 09/26/2007  . OSTEOARTHRITIS 03/18/2007  . Dyslipidemia 02/27/2007  . Coronary atherosclerosis 02/27/2007  . Essential hypertension 02/25/2007  .  Cerebral artery occlusion with cerebral infarction (HCC) 02/28/2004  . ASCENDING AORTIC ANEURYSM 02/28/2004  . H/O aortic valve replacement 02/28/2004  . FIBRILLATION, ATRIAL 08/31/2002    Past Surgical History:  Procedure Laterality Date  . CARDIAC SURGERY     bioprosthetic AV replacement  . EP IMPLANTABLE DEVICE N/A 08/16/2015   [facility] [employer] PPM, Dr. Ladona Ridgel  . ESOPHAGOGASTRODUODENOSCOPY  (EGD) WITH PROPOFOL N/A 01/17/2016   Procedure: ESOPHAGOGASTRODUODENOSCOPY (EGD) WITH PROPOFOL;  Surgeon: Bernette Redbird, MD;  Location: [facility] ENDOSCOPY;  Service: Endoscopy;  Laterality: N/A;       Home Medications    Prior to Admission medications   Medication Sig Start Date End Date Taking? Authorizing Provider  acetaminophen (MAPAP) 325 MG tablet Take 650 mg by mouth 2 (two) times daily. NOT TO EXCEED 3,000 MG/DAY FROM ALL (COMBINED) SOURCES   Yes [provider]  aspirin 81 MG chewable tablet Chew 1 tablet (81 mg total) by mouth daily. 02/29/16  Yes Ghimire, Werner Lean, MD  atorvastatin (LIPITOR) 10 MG tablet Take 10 mg by mouth daily.   Yes [provider]  docusate sodium (COLACE) 100 MG capsule Take 100 mg by mouth 2 (two) times daily.   Yes [provider]  furosemide (LASIX) 20 MG tablet Take 2 tablets (40 mg total) by mouth daily. Reported on 08/17/2015 03/01/16  Yes Ghimire, Werner Lean, MD  gabapentin (NEURONTIN) 300 MG capsule Take 300 mg by mouth 2 (two) times daily.   Yes [provider]  glipiZIDE (GLUCOTROL) 5 MG tablet Take 5 mg by mouth 2 (two) times daily before a meal.  11/14/13  Yes Buriev, Isaiah Serge, MD  hydrALAZINE (APRESOLINE) 25 MG tablet Take 1 tablet (25 mg total) by mouth 2 (two) times daily. 02/29/16  Yes Ghimire, Werner Lean, MD  metFORMIN (GLUCOPHAGE) 1000 MG tablet Take 1,000 mg by mouth at bedtime.   Yes [provider]  pantoprazole (PROTONIX) 40 MG tablet Take 1 tablet (40 mg total) by mouth daily. 01/19/16  Yes Richarda Overlie, MD  polyethylene glycol (MIRALAX / GLYCOLAX) packet Take 17 g by mouth daily. Mix with 8 ounces of liquid/Hold for loose stools   Yes [provider]  tamsulosin (FLOMAX) 0.4 MG CAPS capsule Take 0.4 mg by mouth every morning.  02/12/15  Yes [provider]  traMADol (ULTRAM) 50 MG tablet Take 50 mg by mouth 2 (two) times daily.    Yes [provider]    Family History Family  History  Problem Relation Age of Onset  . Other Mother        old age  . CAD Father   . CAD Brother     Social History Social History  Substance Use Topics  . Smoking status: Never Smoker  . Smokeless tobacco: Never Used  . Alcohol use No     Allergies   Patient has no known allergies.   Review of Systems Review of Systems  Unable to perform ROS: Dementia  Cardiovascular: Positive for chest pain.  hearing difficulty and language barrier   Physical Exam Updated Vital Signs BP (!) 126/93   Pulse (!) 39   Temp 97.7 F (36.5 C) (Oral)   Resp 17   Wt 89.4 kg (197 lb)   SpO2 97%   BMI 26.72 kg/m   Physical Exam  Constitutional: He appears well-developed. No distress.  Frail elderly man, comfortable  HENT:  Head: Normocephalic and atraumatic.  Eyes: Conjunctivae are normal. Pupils are equal, round, and reactive to light.  Neck: Neck supple.  Cardiovascular: Normal rate and regular rhythm.   Murmur heard. Pulmonary/Chest: Effort normal.  Occasional expiratory wheezes  Abdominal: Soft. Bowel sounds are normal. He exhibits no distension. There is no tenderness.  Musculoskeletal: He exhibits no edema.  Neurological: He is alert.  Hard of hearing, fluent speech  Skin: Skin is warm and dry.  Nursing note and vitals reviewed.    ED Treatments / Results  Labs (all labs ordered are listed, but only abnormal results are displayed) Labs Reviewed  BASIC METABOLIC PANEL - Abnormal; Notable for the following:       Result Value   Glucose, Bld 127 (*)    BUN 27 (*)    Creatinine, Ser 1.69 (*)    GFR calc non Af Amer 35 (*)    GFR calc Af Amer 40 (*)    All other components within normal limits  CBC - Abnormal; Notable for the following:    RBC 3.66 (*)    Hemoglobin 8.7 (*)    HCT 28.8 (*)    MCH 23.8 (*)    RDW 16.1 (*)    All other components within normal limits  I-STAT TROPOININ, ED  I-STAT TROPOININ, ED    EKG  EKG  Interpretation  Date/Time:  Tuesday Nov 20 2016 16:46:44 EDT Ventricular Rate:  78 PR Interval:    QRS Duration: 172 QT Interval:  507 QTC Calculation: 453 R Axis:   -76 Text Interpretation:  Atrial fibrillation Ventricular bigeminy Right bundle branch block LVH with secondary repolarization abnormality Inferior infarct, old Anterior infarct, old Bigeminy new from previous but otherwise similar Confirmed by Frederick Peers [phone]) on 11/20/2016 4:53:41 PM       Radiology Dg Chest 2 View  Result Date: 11/20/2016 CLINICAL DATA:  Chest pain since this morning. EXAM: CHEST  2 VIEW COMPARISON:  07/18/2016 FINDINGS: The right ventricular pacer wire is stable. Stable mild cardiac enlargement and tortuous calcified thoracic aorta. There are chronic lung changes and probable scattered calcified granulomas. No acute pulmonary findings or pleural effusion. The bony thorax is intact. IMPRESSION: Stable cardiac enlargement. Chronic bronchitic type interstitial lung changes and scattered calcified granulomas but no acute pulmonary findings. Electronically Signed   By: Rudie Meyer M.D.   On: 11/20/2016 13:04    Procedures Procedures (including critical care time)  Medications Ordered in ED Medications  ipratropium-albuterol (DUONEB) 0.5-2.5 (3) MG/3ML nebulizer solution 3 mL (3 mLs Nebulization Given 11/20/16 1439)  ondansetron (ZOFRAN-ODT) disintegrating tablet 4 mg (4 mg Oral Given 11/20/16 1439)     Initial Impression / Assessment and Plan / ED Course  I have reviewed the triage vital signs and the nursing notes.  Pertinent labs & imaging results that were available during my care of the patient were reviewed by me and considered in my medical decision making (see chart for details).    Pt w/ dementia who p/w Episode of chest pain and shortness of breath reported at his nursing facility today. I had a hard time obtaining any historical information from the patient because he was unable to hear  the language line interpreter despite having video interpreter available. I later spoke with his granddaughter who reported that he stated his symptoms were mild. His initial EKG shows PVCs but is otherwise similar to previous. Repeat EKG several hours later is without any ischemic changes. Lab work shows negative serial troponins, creatinine 1.69 which is similar to previous, anemia with hemoglobin of 8.7 which has been present on previous labs.  Chest x-ray with chronic changes, no acute findings. His vital signs have been stable here. He reported an episode of vomiting earlier but has had no vomiting in the ED and has been able to eat and drink. I reviewed his workup findings with his granddaughter over the phone and recommended close follow-up with his cardiologist in the clinic as I feel a hospitalization would be difficult on him given his dementia and language barrier. She agreed with this plan and understands return precautions. Patient discharged in satisfactory condition.  Final Clinical Impressions(s) / ED Diagnoses   Final diagnoses:  Chest pain, unspecified type  Non-intractable vomiting with nausea, unspecified vomiting type    New Prescriptions New Prescriptions   No medications on file     Little, Ambrose Finlandachel Morgan, MD 11/20/16 1712

## 2016-11-20 NOTE — ED Triage Notes (Signed)
Pt. Coming from [facility] for chest pain that started this morning. Pt. Speaks [place]. Pt. Has yellow DNR paperwork at bedside. Pt. Has pacemaker, but EMS reports no paced rhythm @ 44 bpm with RBBB and frequent PVC.

## 2016-11-20 NOTE — Discharge Instructions (Signed)
CONTACT THE CARDIOLOGY CLINIC FOR A FOLLOW UP APPOINTMENT. RETURN TO ER IF ANY WORSENING SYMPTOMS, ONGOING CHEST PAIN OR SHORTNESS OF BREATH.

## 2016-11-20 NOTE — ED Notes (Signed)
Attempted report to [facility] 3x with no answer. Will keep attempting.

## 2016-11-20 NOTE — ED Notes (Signed)
Cleaned up Pt and changed sheet and gown.

## 2016-12-26 ENCOUNTER — Emergency Department (HOSPITAL_COMMUNITY): Payer: Medicare Other

## 2016-12-26 ENCOUNTER — Encounter (HOSPITAL_COMMUNITY): Payer: Self-pay | Admitting: *Deleted

## 2016-12-26 ENCOUNTER — Emergency Department (HOSPITAL_COMMUNITY)
Admission: EM | Admit: 2016-12-26 | Discharge: 2016-12-26 | Disposition: A | Discharge status: Home / Self Care | Payer: Medicare Other | Attending: Emergency Medicine | Admitting: Emergency Medicine

## 2016-12-26 DIAGNOSIS — Z7982 Long term (current) use of aspirin: Secondary | ICD-10-CM | POA: Diagnosis not present

## 2016-12-26 DIAGNOSIS — E119 Type 2 diabetes mellitus without complications: Secondary | ICD-10-CM | POA: Diagnosis not present

## 2016-12-26 DIAGNOSIS — I10 Essential (primary) hypertension: Secondary | ICD-10-CM | POA: Insufficient documentation

## 2016-12-26 DIAGNOSIS — R0989 Other specified symptoms and signs involving the circulatory and respiratory systems: Secondary | ICD-10-CM

## 2016-12-26 DIAGNOSIS — Z8673 Personal history of transient ischemic attack (TIA), and cerebral infarction without residual deficits: Secondary | ICD-10-CM | POA: Insufficient documentation

## 2016-12-26 DIAGNOSIS — Z7984 Long term (current) use of oral hypoglycemic drugs: Secondary | ICD-10-CM | POA: Diagnosis not present

## 2016-12-26 NOTE — Discharge Instructions (Signed)
As discussed, follow up with gastroenterology for possible repeat endoscopy. Your CT scan did not show any acute abnormalities and exam is reassuring today. Follow a liquid diet until evaluated by Gastroenterology.  Follow up with your primary care doctor as needed. Return to the ER if you experience difficulty breathing or new concerning symptoms in the meantime.

## 2016-12-26 NOTE — ED Triage Notes (Signed)
Patient is alert and oriented to baseline.  [facility]  Patient through a translator states that he choked on a piece of food and now has a sore throat.  Patient is not showing any signs of distress on arrival to the ED.  Patient speak [place].

## 2016-12-26 NOTE — ED Provider Notes (Signed)
[facility]-EMERGENCY DEPT Provider Note   CSN: [id number] Arrival date & time: 12/26/16  1349     History   Chief Complaint Chief Complaint  Patient presents with  . Sore Throat    choked on food    HPI Harold Jones is a 83 y.o. male history of Schatzki ring on endoscopy presenting via EMS from [facility] with dysphagia which has been chronic for the past 3 years. He explains that he had just sat down for dinner when all of a sudden he started choking and couldn't breathe. He had not been eating or drinking yet when the episode occurred. He has had episodes like this in the past and was diagnosed with a condition in which food goes down the wrong way. He had swallowing studies and went through rehab. Today he explains that he feels as though something is stuck in his throat and moving around which makes it hard to swallow and he feels like he is suffocating. He denies any pain associated with this or other symptoms.   [place] interpreter used during this encounter.  HPI  Past Medical History:  Diagnosis Date  . Aortic valve disorder   . BPH without obstruction/lower urinary tract symptoms   . Chest wall pain following surgery   . COGNITIVE DEFICITS DUE CEREBROVASCULAR DISEASE 09/09/2009   Qualifier: Diagnosis of  By: Delrae Alfred MD, Lanora Manis    . CORONARY ARTERY DISEASE 02/27/2007   Qualifier: Diagnosis of  By: Barbaraann Barthel MD, [place]    . CVA 02/28/2004   Annotation: embolic Qualifier: Diagnosis of  By: Barbaraann Barthel MD, [place]    . Dementia    Per paper work from [employer]  . Diabetes mellitus   . DYSLIPIDEMIA 02/27/2007   Qualifier: Diagnosis of  By: Barbaraann Barthel MD, [place]    . FIBRILLATION, ATRIAL 08/31/2002   Qualifier: Diagnosis of  By: Barbaraann Barthel MD, [place]    . Grief   . Hypertension   . Neuropathic pain   . Osteoarthritis of left knee   . Physical deconditioning   . Symptomatic bradycardia 08/16/15   [employer] PPM, Dr. Ladona Ridgel    Patient Active Problem List   Diagnosis Date Noted  . CHF exacerbation (HCC) 02/27/2016  . Altered mental state   . Confusion   . Acute GI bleeding   . GI bleed 01/12/2016  . GIB (gastrointestinal bleeding) 01/12/2016  . Symptomatic bradycardia 08/15/2015  . Intraventricular conduction delay 08/15/2015  . S/P aortic valve replacement with bioprosthetic valve 08/15/2015  . Atrial fibrillation with slow ventricular response (HCC) 08/15/2015  . Severe sepsis (HCC) 05/30/2015  . Acute respiratory failure (HCC) 05/30/2015  . History of stroke 03/15/2015  . Benign hypertensive heart disease without heart failure 03/08/2015  . 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 BPH without obstruction/lower urinary tract symptoms 03/08/2015  . Post herpetic neuralgia 03/08/2015  . CAP (community acquired pneumonia) 03/01/2015  . Syncope and collapse 11/12/2013  . Nasal fracture 11/12/2013  . Encephalopathy acute 06/23/2013  . Diabetes mellitus, type 2 (HCC) 06/23/2013  . Hypoglycemia 06/23/2013  . Supratherapeutic INR 06/23/2013  . Zoster 10/12/2011  . Altered mental status 10/08/2011  . GLAUCOMA 09/08/2010  . KNEE PAIN, LEFT 11/22/2009  . 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 CAROTID BRUIT, RIGHT 01/18/2009  . GASTROINTESTINAL HEMORRHAGE 09/26/2007  . OSTEOARTHRITIS 03/18/2007  . Dyslipidemia 02/27/2007  . Coronary atherosclerosis 02/27/2007  . Essential hypertension 02/25/2007  . Cerebral artery occlusion with cerebral infarction (  HCC) 02/28/2004  . ASCENDING AORTIC ANEURYSM 02/28/2004  . H/O aortic valve replacement 02/28/2004  . FIBRILLATION, ATRIAL 08/31/2002    Past Surgical History:  Procedure Laterality Date  . CARDIAC SURGERY     bioprosthetic AV replacement  . EP IMPLANTABLE DEVICE N/A 08/16/2015   [facility] [employer] PPM, Dr. Ladona Ridgel  . ESOPHAGOGASTRODUODENOSCOPY (EGD) WITH PROPOFOL N/A 01/17/2016   Procedure:  ESOPHAGOGASTRODUODENOSCOPY (EGD) WITH PROPOFOL;  Surgeon: Bernette Redbird, MD;  Location: [facility] ENDOSCOPY;  Service: Endoscopy;  Laterality: N/A;       Home Medications    Prior to Admission medications   Medication Sig Start Date End Date Taking? Authorizing Provider  acetaminophen (MAPAP) 325 MG tablet Take 650 mg by mouth 2 (two) times daily. NOT TO EXCEED 3,000 MG/DAY FROM ALL (COMBINED) SOURCES    [provider]  aspirin 81 MG chewable tablet Chew 1 tablet (81 mg total) by mouth daily. 02/29/16   Ghimire, Werner Lean, MD  atorvastatin (LIPITOR) 10 MG tablet Take 10 mg by mouth daily.    [provider]  docusate sodium (COLACE) 100 MG capsule Take 100 mg by mouth 2 (two) times daily.    [provider]  furosemide (LASIX) 20 MG tablet Take 2 tablets (40 mg total) by mouth daily. Reported on 08/17/2015 03/01/16   Maretta Bees, MD  gabapentin (NEURONTIN) 300 MG capsule Take 300 mg by mouth 2 (two) times daily.    [provider]  glipiZIDE (GLUCOTROL) 5 MG tablet Take 5 mg by mouth 2 (two) times daily before a meal.  11/14/13   Buriev, Isaiah Serge, MD  hydrALAZINE (APRESOLINE) 25 MG tablet Take 1 tablet (25 mg total) by mouth 2 (two) times daily. 02/29/16   Ghimire, Werner Lean, MD  metFORMIN (GLUCOPHAGE) 1000 MG tablet Take 1,000 mg by mouth at bedtime.    [provider]  pantoprazole (PROTONIX) 40 MG tablet Take 1 tablet (40 mg total) by mouth daily. 01/19/16   Richarda Overlie, MD  polyethylene glycol (MIRALAX / GLYCOLAX) packet Take 17 g by mouth daily. Mix with 8 ounces of liquid/Hold for loose stools    [provider]  tamsulosin (FLOMAX) 0.4 MG CAPS capsule Take 0.4 mg by mouth every morning.  02/12/15   [provider]  traMADol (ULTRAM) 50 MG tablet Take 50 mg by mouth 2 (two) times daily.     [provider]    Family History Family History  Problem Relation Age of Onset  . Other Mother        old age  . CAD  Father   . CAD Brother     Social History Social History  Substance Use Topics  . Smoking status: Never Smoker  . Smokeless tobacco: Never Used  . Alcohol use No     Allergies   Patient has no known allergies.   Review of Systems Review of Systems  Constitutional: Negative for chills, fatigue and fever.  HENT: Positive for trouble swallowing. Negative for congestion, drooling, ear pain, sore throat and voice change.        Sensation of foreign body in the throat  Eyes: Negative for pain and visual disturbance.  Respiratory: Positive for choking. Negative for cough, shortness of breath, wheezing and stridor.   Cardiovascular: Negative for chest pain and palpitations.  Gastrointestinal: Negative for abdominal pain, diarrhea, nausea and vomiting.  Genitourinary: Negative for dysuria and hematuria.  Musculoskeletal: Negative for arthralgias, back pain, myalgias, neck pain and neck stiffness.  Skin: Negative  for color change, pallor and rash.  Neurological: Negative for dizziness, tremors, seizures, syncope, facial asymmetry, speech difficulty, weakness, light-headedness, numbness and headaches.     Physical Exam Updated Vital Signs BP (!) 170/56   Pulse 60   Temp 98.3 F (36.8 C) (Oral)   Resp 16   Ht 5\' 11"  (1.803 m)   Wt 93 kg (205 lb)   SpO2 96%   BMI 28.59 kg/m   Physical Exam  Constitutional: He appears well-developed and well-nourished. No distress.  Afebrile, nontoxic-appearing, lying comfortably in bed in no acute distress.  HENT:  Head: Normocephalic and atraumatic.  Mouth/Throat: Oropharynx is clear and moist. No oropharyngeal exudate.  No gross oral abscess. Uvula is midline, arches are simmetrical and intact. No peritonsilar swelling or exudate. No trismus. Tolerating oral secretions.   Eyes: Conjunctivae are normal.  Neck: Normal range of motion. Neck supple. No tracheal deviation present.  Cardiovascular: Normal rate, regular rhythm and normal heart  sounds.   No murmur heard. Pulmonary/Chest: Effort normal and breath sounds normal. No stridor. No respiratory distress. He has no wheezes. He has no rales. He exhibits no tenderness.  Abdominal: Soft. He exhibits no distension. There is no tenderness. There is no guarding.  Musculoskeletal: He exhibits no edema.  Neurological: He is alert.  Skin: Skin is warm and dry. No rash noted. He is not diaphoretic. No erythema. No pallor.  Psychiatric: He has a normal mood and affect.  Nursing note and vitals reviewed.     ED Treatments / Results  Labs (all labs ordered are listed, but only abnormal results are displayed) Labs Reviewed - No data to display  EKG  EKG Interpretation None       Radiology Dg Neck Soft Tissue  Result Date: 12/26/2016 CLINICAL DATA:  Choked on food.  Sore throat. EXAM: NECK SOFT TISSUES - 1+ VIEW COMPARISON:  None. FINDINGS: There is no evidence of retropharyngeal soft tissue swelling or epiglottic enlargement. The cervical airway is unremarkable and no radio-opaque foreign body identified. IMPRESSION: Negative. Electronically Signed   By: Elsie StainJohn T Curnes M.D.   On: 12/26/2016 19:30    Procedures Procedures (including critical care time)  Medications Ordered in ED Medications - No data to display   Initial Impression / Assessment and Plan / ED Course  I have reviewed the triage vital signs and the nursing notes.  Pertinent labs & imaging results that were available during my care of the patient were reviewed by me and considered in my medical decision making (see chart for details).    Patient presents with dysphagia with foreign body sensation in his throat. He states that he didn't choke on any food, he was sitting down to get ready to eat when the episode hit him suddenly and he felt that he was choking. schatzki ring on endoscopy a year ago.  Reassuring exam, he is well-appearing comfortable and tolerating oral secretions well. Oropharynx is clear,  arches are symmetrical, uvula midline.  Will have patient drink fluids and observe. Observed patient drink water with no difficulties. CT neck negative for soft tissue abnormalities.  Patient was discussed with Dr. Madilyn Hookees who has seen patient and agrees with assessment and plan.  Will discharge with GI follow up for repeat endoscopy as indicated. Patient was observed and stable in ED. Successful PO challenge.  Will advise liquid diet. Spoke to his grand daughter Ammie FerrierLjerka over the phone to give her an update as requested upon initial assessment with interpreter. She will be following  up with family members. Discussed strict return precautions and advised to return to the emergency department if experiencing any new or worsening symptoms. Instructions were understood and granddaughter agreed with discharge plan. Patient will be discharged back to [facility].  Final Clinical Impressions(s) / ED Diagnoses   Final diagnoses:  Foreign body sensation in throat    New Prescriptions Discharge Medication List as of 12/26/2016  8:30 PM       Georgiana Shore, PA-C 12/27/16 0206    Tilden Fossa, MD 12/27/16 1435

## 2017-04-20 ENCOUNTER — Emergency Department (HOSPITAL_COMMUNITY): Payer: Medicare Other

## 2017-04-20 ENCOUNTER — Encounter (HOSPITAL_COMMUNITY): Payer: Self-pay | Admitting: Emergency Medicine

## 2017-04-20 ENCOUNTER — Emergency Department (HOSPITAL_BASED_OUTPATIENT_CLINIC_OR_DEPARTMENT_OTHER)
Admit: 2017-04-20 | Discharge: 2017-04-20 | Disposition: A | Discharge status: Home / Self Care | Payer: Medicare Other | Attending: Emergency Medicine | Admitting: Emergency Medicine

## 2017-04-20 ENCOUNTER — Emergency Department (HOSPITAL_COMMUNITY)
Admission: EM | Admit: 2017-04-20 | Discharge: 2017-04-21 | Disposition: A | Discharge status: Home / Self Care | Payer: Medicare Other | Attending: Emergency Medicine | Admitting: Emergency Medicine

## 2017-04-20 DIAGNOSIS — E1149 Type 2 diabetes mellitus with other diabetic neurological complication: Secondary | ICD-10-CM | POA: Diagnosis not present

## 2017-04-20 DIAGNOSIS — R079 Chest pain, unspecified: Secondary | ICD-10-CM | POA: Insufficient documentation

## 2017-04-20 DIAGNOSIS — I2510 Atherosclerotic heart disease of native coronary artery without angina pectoris: Secondary | ICD-10-CM | POA: Insufficient documentation

## 2017-04-20 DIAGNOSIS — M79609 Pain in unspecified limb: Secondary | ICD-10-CM | POA: Diagnosis not present

## 2017-04-20 DIAGNOSIS — R7989 Other specified abnormal findings of blood chemistry: Secondary | ICD-10-CM | POA: Insufficient documentation

## 2017-04-20 DIAGNOSIS — Z7982 Long term (current) use of aspirin: Secondary | ICD-10-CM | POA: Diagnosis not present

## 2017-04-20 DIAGNOSIS — M79605 Pain in left leg: Secondary | ICD-10-CM | POA: Insufficient documentation

## 2017-04-20 DIAGNOSIS — R4182 Altered mental status, unspecified: Secondary | ICD-10-CM | POA: Diagnosis not present

## 2017-04-20 DIAGNOSIS — Z79899 Other long term (current) drug therapy: Secondary | ICD-10-CM | POA: Diagnosis not present

## 2017-04-20 DIAGNOSIS — I10 Essential (primary) hypertension: Secondary | ICD-10-CM | POA: Insufficient documentation

## 2017-04-20 DIAGNOSIS — F0390 Unspecified dementia without behavioral disturbance: Secondary | ICD-10-CM | POA: Diagnosis not present

## 2017-04-20 DIAGNOSIS — I4891 Unspecified atrial fibrillation: Secondary | ICD-10-CM | POA: Diagnosis not present

## 2017-04-20 DIAGNOSIS — Z7984 Long term (current) use of oral hypoglycemic drugs: Secondary | ICD-10-CM | POA: Insufficient documentation

## 2017-04-20 DIAGNOSIS — Z950 Presence of cardiac pacemaker: Secondary | ICD-10-CM | POA: Insufficient documentation

## 2017-04-20 LAB — CBC WITH DIFFERENTIAL/PLATELET
Basophils Absolute: 0 10*3/uL (ref 0.0–0.1)
Basophils Relative: 0 %
Eosinophils Absolute: 0.2 10*3/uL (ref 0.0–0.7)
Eosinophils Relative: 3 %
HCT: 28.8 % — ABNORMAL LOW (ref 39.0–52.0)
Hemoglobin: 8.8 g/dL — ABNORMAL LOW (ref 13.0–17.0)
Lymphocytes Relative: 30 %
Lymphs Abs: 1.4 10*3/uL (ref 0.7–4.0)
MCH: 23.6 pg — ABNORMAL LOW (ref 26.0–34.0)
MCHC: 30.6 g/dL (ref 30.0–36.0)
MCV: 77.2 fL — ABNORMAL LOW (ref 78.0–100.0)
Monocytes Absolute: 0.4 10*3/uL (ref 0.1–1.0)
Monocytes Relative: 9 %
Neutro Abs: 2.7 10*3/uL (ref 1.7–7.7)
Neutrophils Relative %: 58 %
Platelets: 177 10*3/uL (ref 150–400)
RBC: 3.73 MIL/uL — ABNORMAL LOW (ref 4.22–5.81)
RDW: 15.7 % — ABNORMAL HIGH (ref 11.5–15.5)
WBC: 4.7 10*3/uL (ref 4.0–10.5)

## 2017-04-20 LAB — URINALYSIS, ROUTINE W REFLEX MICROSCOPIC
Bilirubin Urine: NEGATIVE
Glucose, UA: NEGATIVE mg/dL
Hgb urine dipstick: NEGATIVE
Ketones, ur: NEGATIVE mg/dL
Leukocytes, UA: NEGATIVE
Nitrite: NEGATIVE
Protein, ur: NEGATIVE mg/dL
Specific Gravity, Urine: 1.008 (ref 1.005–1.030)
pH: 5 (ref 5.0–8.0)

## 2017-04-20 LAB — COMPREHENSIVE METABOLIC PANEL
ALT: 14 U/L — ABNORMAL LOW (ref 17–63)
AST: 17 U/L (ref 15–41)
Albumin: 3.6 g/dL (ref 3.5–5.0)
Alkaline Phosphatase: 36 U/L — ABNORMAL LOW (ref 38–126)
Anion gap: 9 (ref 5–15)
BUN: 25 mg/dL — ABNORMAL HIGH (ref 6–20)
CO2: 24 mmol/L (ref 22–32)
Calcium: 8.9 mg/dL (ref 8.9–10.3)
Chloride: 104 mmol/L (ref 101–111)
Creatinine, Ser: 1.69 mg/dL — ABNORMAL HIGH (ref 0.61–1.24)
GFR calc Af Amer: 40 mL/min — ABNORMAL LOW (ref >60)
GFR calc non Af Amer: 35 mL/min — ABNORMAL LOW (ref >60)
Glucose, Bld: 68 mg/dL (ref 65–99)
Potassium: 3.8 mmol/L (ref 3.5–5.1)
Sodium: 137 mmol/L (ref 135–145)
Total Bilirubin: 0.6 mg/dL (ref 0.3–1.2)
Total Protein: 6.7 g/dL (ref 6.5–8.1)

## 2017-04-20 LAB — I-STAT TROPONIN, ED
TROPONIN I, POC: 0.02 ng/mL (ref 0.00–0.08)
Troponin i, poc: 0.02 ng/mL (ref 0.00–0.08)

## 2017-04-20 LAB — PROTIME-INR
INR: 1.22
Prothrombin Time: 15.3 seconds — ABNORMAL HIGH (ref 11.4–15.2)

## 2017-04-20 LAB — BRAIN NATRIURETIC PEPTIDE: B Natriuretic Peptide: 285.8 pg/mL — ABNORMAL HIGH (ref 0.0–100.0)

## 2017-04-20 MED ORDER — IOPAMIDOL (ISOVUE-370) INJECTION 76%
INTRAVENOUS | Status: AC
  Administered 2017-04-20: 80 mL via INTRAVENOUS
  Filled 2017-04-20: qty 100

## 2017-04-20 NOTE — ED Notes (Signed)
ED Provider at bedside. 

## 2017-04-20 NOTE — Discharge Instructions (Signed)
Your workup today showed no evidence of pneumonia.  You had imaging of your entire torso showing no new problem with your aorta or other significant cause of your symptoms.  The imaging did not show significant evidence of fluid overload in your lungs.  The ultrasound showed no blood clot in your leg.  Your cardiac testing showed no elevation in your troponin which would indicate cardiac injury.  As your symptoms resolved and your workup was reassuring, we felt safe with you being discharged.  Please follow-up with your primary doctor in the next few days for reassessment.  If any symptoms change or worsen, please return to the nearest emergency department.

## 2017-04-20 NOTE — ED Notes (Signed)
Pt was given a sandwich bag and sprite

## 2017-04-20 NOTE — Progress Notes (Signed)
VASCULAR LAB PRELIMINARY  PRELIMINARY  PRELIMINARY  PRELIMINARY  Left lower extremity venous duplex completed.    Preliminary report:  There is no DVT or SVT noted in the left lower extremity.    Gave report to Dr. Cecelia Byarsegeler  Shaynah Hund, [facility], RVT 04/20/2017, 5:53 PM

## 2017-04-20 NOTE — ED Triage Notes (Signed)
[facility] for possible LLE [facility].  Pt does not speak [place], EMS reports staff unsure of complaint.  Pt appears to be in no distress at this time. Resp e/u, skin warm and dry.

## 2017-04-20 NOTE — ED Provider Notes (Signed)
[facility] EMERGENCY DEPARTMENT Provider Note   CSN: [id number] Arrival date & time: 04/20/17  1412     History   Chief Complaint Chief Complaint  Patient presents with  . Leg Pain    HPI Axyl Fischman is a 83 y.o. male with history of CAD, CHF, aortic valve replacement, pacemaker who presents with chest pain and left leg pain.  He reports he has had chest pain before, however it seems to be worsening.  It is a pulsing-like pain.  It is worse when he takes a deep breath.  He reports left leg pain for an unspecified amount of time.  He also reports urinary frequency.  He has lower abdominal pain as well.  He could not specify if he had nausea or vomiting.  The patient is hard of hearing and the [place] interpreter was only able to obtain some, incomplete information from the patient above.  Level 5 caveat due to hearing loss and language barrier-Bosnian  HPI  Past Medical History:  Diagnosis Date  . Aortic valve disorder   . BPH without obstruction/lower urinary tract symptoms   . Chest wall pain following surgery   . COGNITIVE DEFICITS DUE CEREBROVASCULAR DISEASE 09/09/2009   Qualifier: Diagnosis of  By: Delrae Alfred MD, Lanora Manis    . CORONARY ARTERY DISEASE 02/27/2007   Qualifier: Diagnosis of  By: Barbaraann Barthel MD, [place]    . CVA 02/28/2004   Annotation: embolic Qualifier: Diagnosis of  By: Barbaraann Barthel MD, [place]    . Dementia    Per paper work from [employer]  . Diabetes mellitus   . DYSLIPIDEMIA 02/27/2007   Qualifier: Diagnosis of  By: Barbaraann Barthel MD, [place]    . FIBRILLATION, ATRIAL 08/31/2002   Qualifier: Diagnosis of  By: Barbaraann Barthel MD, [place]    . Grief   . Hypertension   . Neuropathic pain   . Osteoarthritis of left knee   . Physical deconditioning   . Symptomatic bradycardia 08/16/15   [employer] PPM, Dr. Ladona Ridgel    Patient Active Problem List   Diagnosis Date Noted  . CHF exacerbation (HCC) 02/27/2016  . Altered mental state   .  Confusion   . Acute GI bleeding   . GI bleed 01/12/2016  . GIB (gastrointestinal bleeding) 01/12/2016  . Symptomatic bradycardia 08/15/2015  . Intraventricular conduction delay 08/15/2015  . S/P aortic valve replacement with bioprosthetic valve 08/15/2015  . Atrial fibrillation with slow ventricular response (HCC) 08/15/2015  . Severe sepsis (HCC) 05/30/2015  . Acute respiratory failure (HCC) 05/30/2015  . History of stroke 03/15/2015  . Benign hypertensive heart disease without heart failure 03/08/2015  . 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 BPH without obstruction/lower urinary tract symptoms 03/08/2015  . Post herpetic neuralgia 03/08/2015  . CAP (community acquired pneumonia) 03/01/2015  . Syncope and collapse 11/12/2013  . Nasal fracture 11/12/2013  . Encephalopathy acute 06/23/2013  . Diabetes mellitus, type 2 (HCC) 06/23/2013  . Hypoglycemia 06/23/2013  . Supratherapeutic INR 06/23/2013  . Zoster 10/12/2011  . Altered mental status 10/08/2011  . GLAUCOMA 09/08/2010  . KNEE PAIN, LEFT 11/22/2009  . 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 CAROTID BRUIT, RIGHT 01/18/2009  . GASTROINTESTINAL HEMORRHAGE 09/26/2007  . OSTEOARTHRITIS 03/18/2007  . Dyslipidemia 02/27/2007  . Coronary atherosclerosis 02/27/2007  . Essential hypertension 02/25/2007  . Cerebral artery occlusion with cerebral infarction (HCC) 02/28/2004  . ASCENDING AORTIC ANEURYSM 02/28/2004  . H/O aortic valve replacement 02/28/2004  .  FIBRILLATION, ATRIAL 08/31/2002    Past Surgical History:  Procedure Laterality Date  . CARDIAC SURGERY     bioprosthetic AV replacement  . EP IMPLANTABLE DEVICE N/A 08/16/2015   [facility] [employer] PPM, Dr. Ladona Ridgel  . ESOPHAGOGASTRODUODENOSCOPY (EGD) WITH PROPOFOL N/A 01/17/2016   Procedure: ESOPHAGOGASTRODUODENOSCOPY (EGD) WITH PROPOFOL;  Surgeon: Bernette Redbird, MD;  Location: [facility] ENDOSCOPY;  Service:  Endoscopy;  Laterality: N/A;       Home Medications    Prior to Admission medications   Medication Sig Start Date End Date Taking? Authorizing Provider  acetaminophen (MAPAP) 325 MG tablet Take 650 mg by mouth 2 (two) times daily. NOT TO EXCEED 3,000 MG/DAY FROM ALL (COMBINED) SOURCES    [provider]  aspirin 81 MG chewable tablet Chew 1 tablet (81 mg total) by mouth daily. 02/29/16   Ghimire, Werner Lean, MD  atorvastatin (LIPITOR) 10 MG tablet Take 10 mg by mouth daily.    [provider]  docusate sodium (COLACE) 100 MG capsule Take 100 mg by mouth 2 (two) times daily.    [provider]  furosemide (LASIX) 20 MG tablet Take 2 tablets (40 mg total) by mouth daily. Reported on 08/17/2015 03/01/16   Maretta Bees, MD  gabapentin (NEURONTIN) 300 MG capsule Take 300 mg by mouth 2 (two) times daily.    [provider]  glipiZIDE (GLUCOTROL) 5 MG tablet Take 5 mg by mouth 2 (two) times daily before a meal.  11/14/13   Buriev, Isaiah Serge, MD  hydrALAZINE (APRESOLINE) 25 MG tablet Take 1 tablet (25 mg total) by mouth 2 (two) times daily. 02/29/16   Ghimire, Werner Lean, MD  metFORMIN (GLUCOPHAGE) 1000 MG tablet Take 1,000 mg by mouth at bedtime.    [provider]  pantoprazole (PROTONIX) 40 MG tablet Take 1 tablet (40 mg total) by mouth daily. 01/19/16   Richarda Overlie, MD  polyethylene glycol (MIRALAX / GLYCOLAX) packet Take 17 g by mouth daily. Mix with 8 ounces of liquid/Hold for loose stools    [provider]  tamsulosin (FLOMAX) 0.4 MG CAPS capsule Take 0.4 mg by mouth every morning.  02/12/15   [provider]  traMADol (ULTRAM) 50 MG tablet Take 50 mg by mouth 2 (two) times daily.     [provider]    Family History Family History  Problem Relation Age of Onset  . Other Mother        old age  . CAD Father   . CAD Brother     Social History Social History  Substance Use Topics  . Smoking status: Never Smoker    . Smokeless tobacco: Never Used  . Alcohol use No     Allergies   Patient has no known allergies.   Review of Systems Review of Systems  Unable to perform ROS: Dementia     Physical Exam Updated Vital Signs BP 135/70   Pulse (!) 53   Temp 98.3 F (36.8 C) (Oral)   Resp 16   Wt 93 kg (205 lb)   SpO2 94%   BMI 28.59 kg/m   Physical Exam  Constitutional: He appears well-developed and well-nourished. No distress.  HENT:  Head: Normocephalic and atraumatic.  Eyes: Pupils are equal, round, and reactive to light. Conjunctivae are normal. Right eye exhibits no discharge. Left eye exhibits no discharge. No scleral icterus.  Neck: Normal range of motion. Neck supple. No thyromegaly present.  Cardiovascular: Normal rate, regular rhythm, normal heart sounds and intact distal  pulses.  Exam reveals no gallop and no friction rub.   No murmur heard. Pulmonary/Chest: Effort normal and breath sounds normal. No stridor. No respiratory distress. He has no wheezes. He has no rales.  Abdominal: Soft. Bowel sounds are normal. He exhibits no distension. There is tenderness in the right lower quadrant, suprapubic area and left lower quadrant. There is no rebound and no guarding.  Musculoskeletal: He exhibits no edema.  Left posterior leg pain from ankle to thigh, no hip tenderness Some varicosities to the left leg more prominent than right  Lymphadenopathy:    He has no cervical adenopathy.  Neurological: He is alert. Coordination normal.  Skin: Skin is warm and dry. No rash noted. He is not diaphoretic. No pallor.  Psychiatric: He has a normal mood and affect.  Nursing note and vitals reviewed.    ED Treatments / Results  Labs (all labs ordered are listed, but only abnormal results are displayed) Labs Reviewed  CBC WITH DIFFERENTIAL/PLATELET  COMPREHENSIVE METABOLIC PANEL  URINALYSIS, ROUTINE W REFLEX MICROSCOPIC  BRAIN NATRIURETIC PEPTIDE  I-STAT TROPONIN, ED    EKG  EKG  Interpretation  Date/Time:  Saturday April 20 2017 14:19:54 EDT Ventricular Rate:  50 PR Interval:    QRS Duration: 180 QT Interval:  496 QTC Calculation: 453 R Axis:   -62 Text Interpretation:  Atrial fibrillation Ventricular premature complex Left bundle branch block No significant change since last tracing Confirmed by Rochele RaringWard, Kristen [phone]([id number]) on 04/20/2017 2:26:07 PM       Radiology Dg Chest Portable 1 View  Result Date: 04/20/2017 CLINICAL DATA:  Chest pain. EXAM: PORTABLE CHEST 1 VIEW COMPARISON:  11/20/2016 FINDINGS: Mild hyperinflation. Prior median sternotomy. Single lead pacer. Midline trachea. Mild cardiomegaly. Diffuse peribronchial thickening. No congestive failure. Left apical calcified granuloma. IMPRESSION: No acute cardiopulmonary disease. Cardiomegaly.  Aortic Atherosclerosis (ICD10-I70.0). Mild hyperinflation and chronic interstitial thickening. No overt congestive failure. Electronically Signed   By: Jeronimo GreavesKyle  Talbot M.D.   On: 04/20/2017 15:44    Procedures Procedures (including critical care time)  Medications Ordered in ED Medications - No data to display   Initial Impression / Assessment and Plan / ED Course  I have reviewed the triage vital signs and the nursing notes.  Pertinent labs & imaging results that were available during my care of the patient were reviewed by me and considered in my medical decision making (see chart for details).     Patient with incomplete history due to language barrier and hearing loss.  Concern for PE and or DVT.  CT angio chest and lower extremity Doppler ultrasound pending.  CT abdomen pelvis pending as well considering lower abdominal tenderness.  Labs and urine pending. At shift change, patient care transferred to Dr. Rush Landmarkegeler for continued evaluation, follow up of labs and imaging and determination of disposition.    Final Clinical Impressions(s) / ED Diagnoses   Final diagnoses:  Left leg pain  Chest pain, unspecified  type    New Prescriptions New Prescriptions   No medications on file     Emi HolesLaw, Mirka Barbone M, PA-C 04/20/17 1611    Tegeler, Canary Brimhristopher J, MD 04/21/17 [phone]

## 2017-04-20 NOTE — ED Provider Notes (Signed)
Care assumed from Harold Jones.   Harold Jones is a 83 y.o. male with a past medical history significant for hypertension, diabetes, CAD, CVA, ascending aortic aneurysm, aortic valve replacement, atrial fibrillation not on anticoagulation, CHF with pacemaker, prior GI bleed, and presents for left leg pain, abdominal pain, urinary frequency and chest pain.  At time of transfer of care, patient is awaiting diagnostic testing to further evaluate etiology of symptoms.  Patient describes his pain as pleuritic, PE study was ordered.  Patient will have ultrasound of the leg to look for DVT as he does not appear to be on anticoagulation.  Due to abdominal tenderness on exam, patient will have CT abdomen and pelvis to further evaluate.  Patient was having urinary frequency, will look for UTI.   On further review of patient's chart, patient was found to have the aortic aneurysm.  The description of throbbing pain in his chest and abdomen, patient will have the CT imaging change to a CTA of the chest abdomen pelvis to look for dissection.   10:40 PM Patient's diagnostic testing results completed.  Patient had negative troponin x2.  Urinalysis showed no infection.  No leukocytosis and similar anemia prior.  Metabolic panel showed similar creatinine to prior and reassuring electrolytes.  BNP slightly elevated however, patient's lungs did not have significant crackles on my exam.  CTA did not show significant pulmonary edema.  CT imaging showed no evidence of new aortic problem.  No evidence of aortic dissection.  There was some evidence of possible remote infarct on the imaging however troponins negative.  Similar thoracic adenopathy to prior.  Stable pulmonary nodules.  No other explanation of symptoms seen on imaging.  Patient was reassessed with a [place] interpreter.  Patient described no other complaints aside from being hungry and thirsty.  Patient will be fed and given fluids.  Given improvement in symptoms  and reassuring workup overall, patient will be felt stable for discharge home.  Anticipate discharge if he successfully eats and drinks.   Clinical Impression: 1. Left leg pain   2. Chest pain, unspecified type   3. Elevated brain natriuretic peptide (BNP) level     Disposition: Discharge  Condition: Good  I have discussed the results, Dx and Tx plan with the pt(& family if present). He/she/they expressed understanding and agree(s) with the plan. Discharge instructions discussed at great length. Strict return precautions discussed and pt &/or family have verbalized understanding of the instructions. No further questions at time of discharge.    Discharge Medication List as of 04/20/2017 11:13 PM      Follow Up: Mortimer FriesCurl, David, PA [name] [place] [address] [phone]  Schedule an appointment as soon as possible for a visit    [facility] [address] [id number] [place] [id number] [phone]  If symptoms worsen     Tegeler, Canary Brimhristopher J, MD 04/21/17 [phone]

## 2017-04-21 NOTE — ED Notes (Signed)
[facility] ARRIVED TO TRANSPORT PT BACK TO NURSING FACILITY. PT NAD AT DC

## 2017-10-02 ENCOUNTER — Emergency Department (HOSPITAL_COMMUNITY): Payer: Medicare Other

## 2017-10-02 ENCOUNTER — Encounter (HOSPITAL_COMMUNITY): Payer: Self-pay | Admitting: Emergency Medicine

## 2017-10-02 ENCOUNTER — Emergency Department (HOSPITAL_COMMUNITY)
Admission: EM | Admit: 2017-10-02 | Discharge: 2017-10-02 | Disposition: A | Discharge status: Home / Self Care | Payer: Medicare Other | Attending: Emergency Medicine | Admitting: Emergency Medicine

## 2017-10-02 ENCOUNTER — Other Ambulatory Visit: Payer: Self-pay

## 2017-10-02 DIAGNOSIS — F0390 Unspecified dementia without behavioral disturbance: Secondary | ICD-10-CM | POA: Insufficient documentation

## 2017-10-02 DIAGNOSIS — I2510 Atherosclerotic heart disease of native coronary artery without angina pectoris: Secondary | ICD-10-CM | POA: Diagnosis not present

## 2017-10-02 DIAGNOSIS — Z7982 Long term (current) use of aspirin: Secondary | ICD-10-CM | POA: Diagnosis not present

## 2017-10-02 DIAGNOSIS — R079 Chest pain, unspecified: Secondary | ICD-10-CM | POA: Insufficient documentation

## 2017-10-02 DIAGNOSIS — E1149 Type 2 diabetes mellitus with other diabetic neurological complication: Secondary | ICD-10-CM | POA: Diagnosis not present

## 2017-10-02 DIAGNOSIS — Z79899 Other long term (current) drug therapy: Secondary | ICD-10-CM | POA: Diagnosis not present

## 2017-10-02 DIAGNOSIS — Z7984 Long term (current) use of oral hypoglycemic drugs: Secondary | ICD-10-CM | POA: Insufficient documentation

## 2017-10-02 DIAGNOSIS — I119 Hypertensive heart disease without heart failure: Secondary | ICD-10-CM | POA: Insufficient documentation

## 2017-10-02 LAB — CBC WITH DIFFERENTIAL/PLATELET
Basophils Absolute: 0 10*3/uL (ref 0.0–0.1)
Basophils Relative: 0 %
Eosinophils Absolute: 0.1 10*3/uL (ref 0.0–0.7)
Eosinophils Relative: 2 %
HCT: 28.6 % — ABNORMAL LOW (ref 39.0–52.0)
Hemoglobin: 8.5 g/dL — ABNORMAL LOW (ref 13.0–17.0)
LYMPHS ABS: 0.8 10*3/uL (ref 0.7–4.0)
LYMPHS PCT: 19 %
MCH: 23 pg — AB (ref 26.0–34.0)
MCHC: 29.7 g/dL — AB (ref 30.0–36.0)
MCV: 77.5 fL — AB (ref 78.0–100.0)
MONO ABS: 0.3 10*3/uL (ref 0.1–1.0)
MONOS PCT: 7 %
NEUTROS ABS: 2.8 10*3/uL (ref 1.7–7.7)
Neutrophils Relative %: 72 %
Platelets: 185 10*3/uL (ref 150–400)
RBC: 3.69 MIL/uL — ABNORMAL LOW (ref 4.22–5.81)
RDW: 16.9 % — AB (ref 11.5–15.5)
WBC: 3.9 10*3/uL — ABNORMAL LOW (ref 4.0–10.5)

## 2017-10-02 LAB — I-STAT TROPONIN, ED
TROPONIN I, POC: 0.02 ng/mL (ref 0.00–0.08)
Troponin i, poc: 0.02 ng/mL (ref 0.00–0.08)

## 2017-10-02 LAB — BASIC METABOLIC PANEL
ANION GAP: 9 (ref 5–15)
BUN: 26 mg/dL — ABNORMAL HIGH (ref 6–20)
CALCIUM: 8.9 mg/dL (ref 8.9–10.3)
CO2: 22 mmol/L (ref 22–32)
CREATININE: 1.61 mg/dL — AB (ref 0.61–1.24)
Chloride: 105 mmol/L (ref 101–111)
GFR calc Af Amer: 43 mL/min — ABNORMAL LOW (ref >60)
GFR calc non Af Amer: 37 mL/min — ABNORMAL LOW (ref >60)
GLUCOSE: 238 mg/dL — AB (ref 65–99)
Potassium: 3.8 mmol/L (ref 3.5–5.1)
Sodium: 136 mmol/L (ref 135–145)

## 2017-10-02 NOTE — ED Triage Notes (Addendum)
Pt arrives via [facility] from [facility] [place]. Pt does not speak english. The chart has bosnian documented as native language. Staff there reported pt clutching at chest/upper abdomen. Attempted to use [place] translator to assess why pt is here and he reported to the interpreter that he could not hear him. I also attempted to call grandaughter and there was no answer. Pt motioning for water. Noted to have strong yellow productive cough. Hx of dementia, afib, pacemaker. Afebrile. VSS.

## 2017-10-02 NOTE — ED Notes (Signed)
Contacted [facility] for tx back home 

## 2017-10-02 NOTE — ED Provider Notes (Signed)
[facility] Provider Note   CSN: [id number] Arrival date & time: 10/02/17  1018     History   Chief Complaint Chief Complaint  Patient presents with  . Chest Pain    HPI Harold Jones is a 84 y.o. male.  HPI  A LEVEL 5 CAVEAT PERTAINS DUE TO DEMENTIA AND LANGUAGE BARRIER.  Video interpreter was attempted for bosnian language but patient seemed to not be able to hear the interpreter or communicate with them.  Per report he was seen holding his chest at his nursing facility today.  He currently appears comfortable, is smiling when answering questions but does not speak english.    Past Medical History:  Diagnosis Date  . Aortic valve disorder   . BPH without obstruction/lower urinary tract symptoms   . Chest wall pain following surgery   . COGNITIVE DEFICITS DUE CEREBROVASCULAR DISEASE 09/09/2009   Qualifier: Diagnosis of  By: Delrae AlfredMulberry MD, Lanora ManisElizabeth    . CORONARY ARTERY DISEASE 02/27/2007   Qualifier: Diagnosis of  By: Barbaraann Barthelankins MD, [place]    . CVA 02/28/2004   Annotation: embolic Qualifier: Diagnosis of  By: Barbaraann Barthelankins MD, [place]    . Dementia    [facility]  . Diabetes mellitus   . DYSLIPIDEMIA 02/27/2007   Qualifier: Diagnosis of  By: Barbaraann Barthelankins MD, [place]    . FIBRILLATION, ATRIAL 08/31/2002   Qualifier: Diagnosis of  By: Barbaraann Barthelankins MD, [place]    . Grief   . Hypertension   . Neuropathic pain   . Osteoarthritis of left knee   . Physical deconditioning   . Symptomatic bradycardia 08/16/15   [employer] PPM, Dr. Ladona Ridgelaylor    Patient Active Problem List   Diagnosis Date Noted  . CHF exacerbation (HCC) 02/27/2016  . Altered mental state   . Confusion   . Acute GI bleeding   . GI bleed 01/12/2016  . GIB (gastrointestinal bleeding) 01/12/2016  . Symptomatic bradycardia 08/15/2015  . Intraventricular conduction delay 08/15/2015  . S/P aortic valve replacement with bioprosthetic valve 08/15/2015  . Atrial  fibrillation with slow ventricular response (HCC) 08/15/2015  . Severe sepsis (HCC) 05/30/2015  . Acute respiratory failure (HCC) 05/30/2015  . History of stroke 03/15/2015  . Benign hypertensive heart disease without heart failure 03/08/2015  . 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 BPH without obstruction/lower urinary tract symptoms 03/08/2015  . Post herpetic neuralgia 03/08/2015  . CAP (community acquired pneumonia) 03/01/2015  . Syncope and collapse 11/12/2013  . Nasal fracture 11/12/2013  . Encephalopathy acute 06/23/2013  . Diabetes mellitus, type 2 (HCC) 06/23/2013  . Hypoglycemia 06/23/2013  . Supratherapeutic INR 06/23/2013  . Zoster 10/12/2011  . Altered mental status 10/08/2011  . GLAUCOMA 09/08/2010  . KNEE PAIN, LEFT 11/22/2009  . 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 CAROTID BRUIT, RIGHT 01/18/2009  . GASTROINTESTINAL HEMORRHAGE 09/26/2007  . OSTEOARTHRITIS 03/18/2007  . Dyslipidemia 02/27/2007  . Coronary atherosclerosis 02/27/2007  . Essential hypertension 02/25/2007  . Cerebral artery occlusion with cerebral infarction (HCC) 02/28/2004  . ASCENDING AORTIC ANEURYSM 02/28/2004  . H/O aortic valve replacement 02/28/2004  . FIBRILLATION, ATRIAL 08/31/2002    Past Surgical History:  Procedure Laterality Date  . CARDIAC SURGERY     bioprosthetic AV replacement  . EP IMPLANTABLE DEVICE N/A 08/16/2015   [facility] Dr[facility] Ladona Ridgelaylor  . ESOPHAGOGASTRODUODENOSCOPY (EGD) WITH PROPOFOL N/A 01/17/2016   Procedure: ESOPHAGOGASTRODUODENOSCOPY (EGD) WITH PROPOFOL;  Surgeon: Bernette Redbird, MD;  [facility];  Service: Endoscopy;  Laterality: N/A;        Home Medications    Prior to Admission medications   Medication Sig Start Date End Date Taking? Authorizing Provider  aspirin 81 MG chewable tablet Chew 1 tablet (81 mg total) by mouth daily. 02/29/16  Yes Ghimire, Werner Lean, MD    atorvastatin (LIPITOR) 10 MG tablet Take 10 mg by mouth daily.   Yes [provider]  docusate sodium (COLACE) 100 MG capsule Take 100 mg by mouth 2 (two) times daily.   Yes [provider]  furosemide (LASIX) 20 MG tablet Take 2 tablets (40 mg total) by mouth daily. Reported on 08/17/2015 03/01/16  Yes Ghimire, Werner Lean, MD  gabapentin (NEURONTIN) 300 MG capsule Take 300 mg by mouth 2 (two) times daily.   Yes [provider]  glipiZIDE (GLUCOTROL) 5 MG tablet Take 5 mg by mouth 2 (two) times daily.  11/14/13  Yes Buriev, Isaiah Serge, MD  hydrALAZINE (APRESOLINE) 25 MG tablet Take 1 tablet (25 mg total) by mouth 2 (two) times daily. Patient taking differently: Take 25 mg by mouth 3 (three) times daily with meals.  02/29/16  Yes Ghimire, Werner Lean, MD  metFORMIN (GLUCOPHAGE) 1000 MG tablet Take 1,000 mg by mouth every evening.    Yes [provider]  oxyCODONE (OXY IR/ROXICODONE) 5 MG immediate release tablet Take 5 mg by mouth 2 (two) times daily.   Yes [provider]  pantoprazole (PROTONIX) 40 MG tablet Take 1 tablet (40 mg total) by mouth daily. 01/19/16  Yes Richarda Overlie, MD  polyethylene glycol (MIRALAX / GLYCOLAX) packet Take 17 g by mouth daily. Mix with 8 ounces of liquid and drink/Hold for loose stools   Yes [provider]  tamsulosin (FLOMAX) 0.4 MG CAPS capsule Take 0.4 mg by mouth daily.  02/12/15  Yes [provider]    Family History Family History  Problem Relation Age of Onset  . Other Mother        old age  . CAD Father   . CAD Brother     Social History Social History   Tobacco Use  . Smoking status: Never Smoker  . Smokeless tobacco: Never Used  Substance Use Topics  . Alcohol use: No  . Drug use: No     Allergies   Patient has no known allergies.   Review of Systems Review of Systems  UNABLE TO OBTAIN ROS DUE TO LEVEL 5 CAVEAT   Physical Exam Updated Vital Signs BP (!) 140/58   Pulse (!) 50    Temp 98.3 F (36.8 C) (Oral)   Resp 14   SpO2 100%  Vitals reviewed Physical Exam  Physical Examination: General appearance - alert, well appearing, and in no distress Mental status - alert, oriented to person, place, and time Eyes -  No conjunctival injection, no scleral icterus Mouth - mucous membranes moist, pharynx normal without lesions Chest - clear to auscultation, no wheezes, rales or rhonchi, symmetric air entry Heart - normal rate, regular rhythm, normal S1, S2, no murmurs, rubs, clicks or gallops Abdomen - soft, nontender, nondistended, no masses or organomegaly Neurological - alert, unable to assess orientation due to language barrier, no facial droop, moving all extremities Extremities - peripheral pulses normal, no pedal edema, no clubbing or cyanosis Skin - normal coloration and turgor, no rashes   ED Treatments / Results  Labs (all labs ordered are listed, but only abnormal  results are displayed) Labs Reviewed  CBC WITH DIFFERENTIAL/PLATELET - Abnormal; Notable for the following components:      Result Value   WBC 3.9 (*)    RBC 3.69 (*)    Hemoglobin 8.5 (*)    HCT 28.6 (*)    MCV 77.5 (*)    MCH 23.0 (*)    MCHC 29.7 (*)    RDW 16.9 (*)    All other components within normal limits  BASIC METABOLIC PANEL - Abnormal; Notable for the following components:   Glucose, Bld 238 (*)    BUN 26 (*)    Creatinine, Ser 1.61 (*)    GFR calc non Af Amer 37 (*)    GFR calc Af Amer 43 (*)    All other components within normal limits  I-STAT TROPONIN, ED  I-STAT TROPONIN, ED    EKG EKG Interpretation  Date/Time:  Wednesday October 02 2017 10:34:36 EDT Ventricular Rate:  50 PR Interval:    QRS Duration: 180 QT Interval:  524 QTC Calculation: 478 R Axis:   -57 Text Interpretation:  A-flutter/fibrillation Left bundle branch block No significant change since last tracing Confirmed by Jerelyn Scott [phone]) on 10/02/2017 10:39:28 AM   Radiology Dg Chest 2  View  Result Date: 10/02/2017 CLINICAL DATA:  Chest pain and cough EXAM: CHEST - 2 VIEW COMPARISON:  04/02/2017 FINDINGS: LEFT-sided pacemaker overlies enlarged cardiac silhouette. Central venous congestion is increased from prior. Mild interstitial edema pattern small effusions. No infiltrate or pneumothorax. IMPRESSION: Cardiomegaly and central venous congestion and mild interstitial edema suggests mild congestive heart failure Electronically Signed   By: Genevive Bi M.D.   On: 10/02/2017 11:56    Procedures Procedures (including critical care time)  Medications Ordered in ED Medications - No data to display   Initial Impression / Assessment and Plan / ED Course  I have reviewed the triage vital signs and the nursing notes.  Pertinent labs & imaging results that were available during my care of the patient were reviewed by me and considered in my medical decision making (see chart for details).     Patient presenting after report of clutching his chest at nursing facility this morning.  Not able to communicate with this patient despite attempts with video interpreter as he is hard of hearing and speaks [place] and is not able to communicate with the interpreter.  He is well-appearing and is smiling and attempting to answer questions.  Per chart review he has had multiple evaluations for similar presentations.  2 sets of troponins were negative.  His EKG is unchanged from prior.  Doubt ACS at this time and patient is stable for discharge back to his facility.  Final Clinical Impressions(s) / ED Diagnoses   Final diagnoses:  Chest pain, unspecified type    ED Discharge Orders    None       Phillis Haggis, MD 10/02/17 1616

## 2017-10-02 NOTE — Discharge Instructions (Signed)
Return to the ED with any concerns including

## 2017-10-02 NOTE — ED Notes (Signed)
[facility]. Report given to Merdis DelayBernice, RN at facility. [facility]

## 2017-12-19 ENCOUNTER — Encounter (HOSPITAL_COMMUNITY): Payer: Self-pay

## 2017-12-19 ENCOUNTER — Emergency Department (HOSPITAL_COMMUNITY): Payer: Medicare Other

## 2017-12-19 ENCOUNTER — Observation Stay (HOSPITAL_COMMUNITY): Payer: Medicare Other

## 2017-12-19 ENCOUNTER — Observation Stay (HOSPITAL_COMMUNITY)
Admission: EM | Admit: 2017-12-19 | Discharge: 2017-12-21 | Disposition: A | Discharge status: Nursing Home (Includes ICF) | Payer: Medicare Other | Attending: Internal Medicine | Admitting: Internal Medicine

## 2017-12-19 ENCOUNTER — Other Ambulatory Visit: Payer: Self-pay

## 2017-12-19 DIAGNOSIS — I5032 Chronic diastolic (congestive) heart failure: Secondary | ICD-10-CM | POA: Diagnosis not present

## 2017-12-19 DIAGNOSIS — Z7901 Long term (current) use of anticoagulants: Secondary | ICD-10-CM | POA: Diagnosis not present

## 2017-12-19 DIAGNOSIS — R0789 Other chest pain: Secondary | ICD-10-CM | POA: Diagnosis not present

## 2017-12-19 DIAGNOSIS — Z79899 Other long term (current) drug therapy: Secondary | ICD-10-CM | POA: Diagnosis not present

## 2017-12-19 DIAGNOSIS — R0989 Other specified symptoms and signs involving the circulatory and respiratory systems: Secondary | ICD-10-CM | POA: Diagnosis not present

## 2017-12-19 DIAGNOSIS — N183 Chronic kidney disease, stage 3 unspecified: Secondary | ICD-10-CM

## 2017-12-19 DIAGNOSIS — F0390 Unspecified dementia without behavioral disturbance: Secondary | ICD-10-CM

## 2017-12-19 DIAGNOSIS — Z953 Presence of xenogenic heart valve: Secondary | ICD-10-CM | POA: Insufficient documentation

## 2017-12-19 DIAGNOSIS — I700 Atherosclerosis of aorta: Secondary | ICD-10-CM | POA: Diagnosis not present

## 2017-12-19 DIAGNOSIS — I712 Thoracic aortic aneurysm, without rupture: Secondary | ICD-10-CM | POA: Diagnosis not present

## 2017-12-19 DIAGNOSIS — F329 Major depressive disorder, single episode, unspecified: Secondary | ICD-10-CM | POA: Diagnosis not present

## 2017-12-19 DIAGNOSIS — G9340 Encephalopathy, unspecified: Secondary | ICD-10-CM | POA: Insufficient documentation

## 2017-12-19 DIAGNOSIS — K922 Gastrointestinal hemorrhage, unspecified: Secondary | ICD-10-CM | POA: Insufficient documentation

## 2017-12-19 DIAGNOSIS — I482 Chronic atrial fibrillation: Secondary | ICD-10-CM | POA: Diagnosis not present

## 2017-12-19 DIAGNOSIS — Z7982 Long term (current) use of aspirin: Secondary | ICD-10-CM | POA: Insufficient documentation

## 2017-12-19 DIAGNOSIS — E1122 Type 2 diabetes mellitus with diabetic chronic kidney disease: Secondary | ICD-10-CM | POA: Diagnosis not present

## 2017-12-19 DIAGNOSIS — I2510 Atherosclerotic heart disease of native coronary artery without angina pectoris: Secondary | ICD-10-CM | POA: Diagnosis not present

## 2017-12-19 DIAGNOSIS — M1990 Unspecified osteoarthritis, unspecified site: Secondary | ICD-10-CM | POA: Insufficient documentation

## 2017-12-19 DIAGNOSIS — R778 Other specified abnormalities of plasma proteins: Secondary | ICD-10-CM

## 2017-12-19 DIAGNOSIS — E785 Hyperlipidemia, unspecified: Secondary | ICD-10-CM | POA: Diagnosis not present

## 2017-12-19 DIAGNOSIS — K219 Gastro-esophageal reflux disease without esophagitis: Secondary | ICD-10-CM | POA: Diagnosis not present

## 2017-12-19 DIAGNOSIS — R079 Chest pain, unspecified: Secondary | ICD-10-CM

## 2017-12-19 DIAGNOSIS — I130 Hypertensive heart and chronic kidney disease with heart failure and stage 1 through stage 4 chronic kidney disease, or unspecified chronic kidney disease: Secondary | ICD-10-CM | POA: Insufficient documentation

## 2017-12-19 DIAGNOSIS — E119 Type 2 diabetes mellitus without complications: Secondary | ICD-10-CM

## 2017-12-19 DIAGNOSIS — I10 Essential (primary) hypertension: Secondary | ICD-10-CM | POA: Diagnosis present

## 2017-12-19 DIAGNOSIS — I447 Left bundle-branch block, unspecified: Secondary | ICD-10-CM | POA: Insufficient documentation

## 2017-12-19 DIAGNOSIS — Z8673 Personal history of transient ischemic attack (TIA), and cerebral infarction without residual deficits: Secondary | ICD-10-CM | POA: Diagnosis not present

## 2017-12-19 DIAGNOSIS — N400 Enlarged prostate without lower urinary tract symptoms: Secondary | ICD-10-CM | POA: Insufficient documentation

## 2017-12-19 DIAGNOSIS — Z8249 Family history of ischemic heart disease and other diseases of the circulatory system: Secondary | ICD-10-CM | POA: Diagnosis not present

## 2017-12-19 DIAGNOSIS — I080 Rheumatic disorders of both mitral and aortic valves: Secondary | ICD-10-CM | POA: Insufficient documentation

## 2017-12-19 DIAGNOSIS — H409 Unspecified glaucoma: Secondary | ICD-10-CM | POA: Insufficient documentation

## 2017-12-19 DIAGNOSIS — N281 Cyst of kidney, acquired: Secondary | ICD-10-CM | POA: Insufficient documentation

## 2017-12-19 DIAGNOSIS — R7989 Other specified abnormal findings of blood chemistry: Secondary | ICD-10-CM

## 2017-12-19 DIAGNOSIS — Z9049 Acquired absence of other specified parts of digestive tract: Secondary | ICD-10-CM | POA: Insufficient documentation

## 2017-12-19 DIAGNOSIS — Z794 Long term (current) use of insulin: Secondary | ICD-10-CM | POA: Diagnosis not present

## 2017-12-19 DIAGNOSIS — R748 Abnormal levels of other serum enzymes: Secondary | ICD-10-CM | POA: Diagnosis present

## 2017-12-19 DIAGNOSIS — R791 Abnormal coagulation profile: Secondary | ICD-10-CM | POA: Insufficient documentation

## 2017-12-19 LAB — COMPREHENSIVE METABOLIC PANEL
ALT: 14 U/L — AB (ref 17–63)
AST: 21 U/L (ref 15–41)
Albumin: 3.9 g/dL (ref 3.5–5.0)
Alkaline Phosphatase: 37 U/L — ABNORMAL LOW (ref 38–126)
Anion gap: 10 (ref 5–15)
BUN: 33 mg/dL — ABNORMAL HIGH (ref 6–20)
CHLORIDE: 110 mmol/L (ref 101–111)
CO2: 26 mmol/L (ref 22–32)
CREATININE: 1.77 mg/dL — AB (ref 0.61–1.24)
Calcium: 9.6 mg/dL (ref 8.9–10.3)
GFR, EST AFRICAN AMERICAN: 38 mL/min — AB (ref >60)
GFR, EST NON AFRICAN AMERICAN: 33 mL/min — AB (ref >60)
Glucose, Bld: 129 mg/dL — ABNORMAL HIGH (ref 65–99)
Potassium: 3.8 mmol/L (ref 3.5–5.1)
Sodium: 146 mmol/L — ABNORMAL HIGH (ref 135–145)
Total Bilirubin: 0.9 mg/dL (ref 0.3–1.2)
Total Protein: 7.3 g/dL (ref 6.5–8.1)

## 2017-12-19 LAB — CBC WITH DIFFERENTIAL/PLATELET
BASOS ABS: 0 10*3/uL (ref 0.0–0.1)
Basophils Relative: 0 %
EOS ABS: 0 10*3/uL (ref 0.0–0.7)
Eosinophils Relative: 0 %
HCT: 31.6 % — ABNORMAL LOW (ref 39.0–52.0)
HEMOGLOBIN: 9.7 g/dL — AB (ref 13.0–17.0)
Lymphocytes Relative: 16 %
Lymphs Abs: 0.9 10*3/uL (ref 0.7–4.0)
MCH: 24.4 pg — ABNORMAL LOW (ref 26.0–34.0)
MCHC: 30.7 g/dL (ref 30.0–36.0)
MCV: 79.4 fL (ref 78.0–100.0)
Monocytes Absolute: 0.6 10*3/uL (ref 0.1–1.0)
Monocytes Relative: 10 %
NEUTROS PCT: 74 %
Neutro Abs: 4 10*3/uL (ref 1.7–7.7)
Platelets: 182 10*3/uL (ref 150–400)
RBC: 3.98 MIL/uL — AB (ref 4.22–5.81)
RDW: 16.4 % — ABNORMAL HIGH (ref 11.5–15.5)
WBC: 5.5 10*3/uL (ref 4.0–10.5)

## 2017-12-19 LAB — SEDIMENTATION RATE: Sed Rate: 18 mm/hr — ABNORMAL HIGH (ref 0–16)

## 2017-12-19 LAB — CK: Total CK: 106 U/L (ref 49–397)

## 2017-12-19 LAB — GLUCOSE, CAPILLARY
GLUCOSE-CAPILLARY: 123 mg/dL — AB (ref 65–99)
GLUCOSE-CAPILLARY: 149 mg/dL — AB (ref 65–99)

## 2017-12-19 LAB — I-STAT TROPONIN, ED: TROPONIN I, POC: 0.11 ng/mL — AB (ref 0.00–0.08)

## 2017-12-19 LAB — TROPONIN I
TROPONIN I: 0.09 ng/mL — AB (ref <0.03)
Troponin I: 0.12 ng/mL (ref <0.03)

## 2017-12-19 LAB — MRSA PCR SCREENING: MRSA by PCR: NEGATIVE

## 2017-12-19 MED ORDER — INSULIN ASPART 100 UNIT/ML ~~LOC~~ SOLN
0.0000 [IU] | Freq: Three times a day (TID) | SUBCUTANEOUS | Status: DC
  Administered 2017-12-20: 2 [IU] via SUBCUTANEOUS
  Administered 2017-12-20 – 2017-12-21 (×2): 1 [IU] via SUBCUTANEOUS
  Administered 2017-12-21: 2 [IU] via SUBCUTANEOUS

## 2017-12-19 MED ORDER — TAMSULOSIN HCL 0.4 MG PO CAPS
0.4000 mg | ORAL_CAPSULE | Freq: Every day | ORAL | Status: DC
  Administered 2017-12-20 – 2017-12-21 (×2): 0.4 mg via ORAL
  Filled 2017-12-19 (×2): qty 1

## 2017-12-19 MED ORDER — CITALOPRAM HYDROBROMIDE 20 MG PO TABS
10.0000 mg | ORAL_TABLET | Freq: Every day | ORAL | Status: DC
  Administered 2017-12-20 – 2017-12-21 (×2): 10 mg via ORAL
  Filled 2017-12-19 (×2): qty 1

## 2017-12-19 MED ORDER — POLYETHYLENE GLYCOL 3350 17 G PO PACK
17.0000 g | PACK | Freq: Every day | ORAL | Status: DC
  Administered 2017-12-20 – 2017-12-21 (×2): 17 g via ORAL
  Filled 2017-12-19 (×2): qty 1

## 2017-12-19 MED ORDER — GABAPENTIN 300 MG PO CAPS
300.0000 mg | ORAL_CAPSULE | Freq: Two times a day (BID) | ORAL | Status: DC
  Administered 2017-12-19 – 2017-12-21 (×4): 300 mg via ORAL
  Filled 2017-12-19 (×4): qty 1

## 2017-12-19 MED ORDER — RISPERIDONE 0.5 MG PO TABS
0.5000 mg | ORAL_TABLET | Freq: Every day | ORAL | Status: DC
  Administered 2017-12-20 – 2017-12-21 (×2): 0.5 mg via ORAL
  Filled 2017-12-19 (×2): qty 1

## 2017-12-19 MED ORDER — ACETAMINOPHEN 325 MG PO TABS
650.0000 mg | ORAL_TABLET | Freq: Four times a day (QID) | ORAL | Status: DC | PRN
  Administered 2017-12-20: 650 mg via ORAL
  Filled 2017-12-19: qty 2

## 2017-12-19 MED ORDER — ONDANSETRON HCL 4 MG/2ML IJ SOLN
4.0000 mg | Freq: Four times a day (QID) | INTRAMUSCULAR | Status: DC | PRN
Start: 2017-12-19 — End: 2017-12-21

## 2017-12-19 MED ORDER — ACETAMINOPHEN 650 MG RE SUPP: 650.0000 mg | Freq: Four times a day (QID) | RECTAL | Status: DC | PRN

## 2017-12-19 MED ORDER — ASPIRIN 81 MG PO CHEW
324.0000 mg | CHEWABLE_TABLET | Freq: Once | ORAL | Status: AC
  Administered 2017-12-19: 324 mg via ORAL
  Filled 2017-12-19: qty 4

## 2017-12-19 MED ORDER — ASPIRIN 81 MG PO CHEW
81.0000 mg | CHEWABLE_TABLET | Freq: Every day | ORAL | Status: DC
  Administered 2017-12-20 – 2017-12-21 (×2): 81 mg via ORAL
  Filled 2017-12-19 (×2): qty 1

## 2017-12-19 MED ORDER — FUROSEMIDE 40 MG PO TABS
40.0000 mg | ORAL_TABLET | Freq: Every day | ORAL | Status: DC
  Administered 2017-12-20: 40 mg via ORAL
  Filled 2017-12-19: qty 1

## 2017-12-19 MED ORDER — ONDANSETRON HCL 4 MG PO TABS: 4.0000 mg | ORAL_TABLET | Freq: Four times a day (QID) | ORAL | Status: DC | PRN

## 2017-12-19 MED ORDER — ACETAMINOPHEN 325 MG PO TABS
650.0000 mg | ORAL_TABLET | Freq: Once | ORAL | Status: AC
  Administered 2017-12-19: 650 mg via ORAL
  Filled 2017-12-19: qty 2

## 2017-12-19 MED ORDER — ATORVASTATIN CALCIUM 10 MG PO TABS
10.0000 mg | ORAL_TABLET | Freq: Every day | ORAL | Status: DC
  Administered 2017-12-20 – 2017-12-21 (×2): 10 mg via ORAL
  Filled 2017-12-19 (×2): qty 1

## 2017-12-19 MED ORDER — IOHEXOL 300 MG/ML  SOLN
75.0000 mL | Freq: Once | INTRAMUSCULAR | Status: AC | PRN
  Administered 2017-12-19: 60 mL via INTRAVENOUS

## 2017-12-19 MED ORDER — PANTOPRAZOLE SODIUM 40 MG PO TBEC
40.0000 mg | DELAYED_RELEASE_TABLET | Freq: Every day | ORAL | Status: DC
  Administered 2017-12-20 – 2017-12-21 (×2): 40 mg via ORAL
  Filled 2017-12-19 (×2): qty 1

## 2017-12-19 MED ORDER — HYDRALAZINE HCL 25 MG PO TABS
25.0000 mg | ORAL_TABLET | Freq: Three times a day (TID) | ORAL | Status: DC
  Administered 2017-12-19 – 2017-12-21 (×6): 25 mg via ORAL
  Filled 2017-12-19 (×6): qty 1

## 2017-12-19 NOTE — ED Triage Notes (Signed)
[facility]IB[facility]. Pt is non English speaking and [facility] not [facility] interpreter. Pt points to epigastric area for pain possibly. Pt did not eat breakfast today. 172/69 HR 56 97% 2L O2 FSBG 104.

## 2017-12-19 NOTE — H&P (Signed)
History and Physical    Kirsten Sweis ZOX:[id number] DOB: 1927/07/24 DOA: 12/19/2017  PCP: Mortimer Fries, PA  Patient coming from: SNF   Chief Complaint: Chest pain   HPI: Harold Jones is a 84 y.o. male with medical history significant of coronary artery disease, hyperlipidemia, dementia, diabetes, who presents to the hospital with complaints of chest pain.  Patient is [place] speaking, with history of dementia, very poor historian.  It is very difficult to obtain history from patient.  Phone interpreter was used during examination.  Patient states that he started having sharp central chest pain starting last night.  He also states that he has not been able to ambulate because of leg pain.  He is unable to state how long this is been going on for, or is able to locate his source of leg pain. He states that he does not have leg pain at rest.  Denies any falling or trauma. He also admits to productive cough, states that it has been bloody.   ED Course: Labs obtained which revealed mildly elevated troponin 0.11.  EKG did not reveal any acute ST abnormalities.  Review of Systems: As per HPI otherwise 10 point review of systems negative.   Past Medical History:  Diagnosis Date  . Aortic valve disorder   . BPH without obstruction/lower urinary tract symptoms   . Chest wall pain following surgery   . COGNITIVE DEFICITS DUE CEREBROVASCULAR DISEASE 09/09/2009   Qualifier: Diagnosis of  By: Delrae Alfred MD, Lanora Manis    . CORONARY ARTERY DISEASE 02/27/2007   Qualifier: Diagnosis of  By: Barbaraann Barthel MD, [place]    . CVA 02/28/2004   Annotation: embolic Qualifier: Diagnosis of  By: Barbaraann Barthel MD, [place]    . Dementia    Per paper work from [employer]  . Diabetes mellitus   . DYSLIPIDEMIA 02/27/2007   Qualifier: Diagnosis of  By: Barbaraann Barthel MD, [place]    . FIBRILLATION, ATRIAL 08/31/2002   Qualifier: Diagnosis of  By: Barbaraann Barthel MD, [place]    . Grief   . Hypertension   . Neuropathic pain   .  Osteoarthritis of left knee   . Physical deconditioning   . Symptomatic bradycardia 08/16/15   [employer] PPM, Dr. Ladona Ridgel    Past Surgical History:  Procedure Laterality Date  . CARDIAC SURGERY     bioprosthetic AV replacement  . EP IMPLANTABLE DEVICE N/A 08/16/2015   [facility] [employer] PPM, Dr. Ladona Ridgel  . ESOPHAGOGASTRODUODENOSCOPY (EGD) WITH PROPOFOL N/A 01/17/2016   Procedure: ESOPHAGOGASTRODUODENOSCOPY (EGD) WITH PROPOFOL;  Surgeon: Bernette Redbird, MD;  Location: [facility] ENDOSCOPY;  Service: Endoscopy;  Laterality: N/A;     reports that he has never smoked. He has never used smokeless tobacco. He reports that he does not drink alcohol or use drugs.  No Known Allergies  Family History  Problem Relation Age of Onset  . Other Mother        old age  . CAD Father   . CAD Brother     Prior to Admission medications   Medication Sig Start Date End Date Taking? Authorizing Provider  acetaminophen (TYLENOL) 500 MG tablet Take 1,000 mg by mouth 3 (three) times daily.   Yes [provider]  aspirin 81 MG chewable tablet Chew 1 tablet (81 mg total) by mouth daily. 02/29/16  Yes Ghimire, Werner Lean, MD  atorvastatin (LIPITOR) 10 MG tablet Take 10 mg by mouth daily.   Yes [provider]  citalopram (CELEXA) 10 MG tablet Take 10 mg by mouth  daily.   Yes [provider]  docusate sodium (COLACE) 100 MG capsule Take 100 mg by mouth 2 (two) times daily.   Yes [provider]  furosemide (LASIX) 20 MG tablet Take 2 tablets (40 mg total) by mouth daily. Reported on 08/17/2015 03/01/16  Yes Ghimire, Werner Lean, MD  gabapentin (NEURONTIN) 300 MG capsule Take 300 mg by mouth 2 (two) times daily.   Yes [provider]  glipiZIDE (GLUCOTROL) 5 MG tablet Take 5 mg by mouth 2 (two) times daily.  11/14/13  Yes Buriev, Isaiah Serge, MD  hydrALAZINE (APRESOLINE) 25 MG tablet Take 1 tablet (25 mg total) by mouth 2 (two) times daily. Patient taking differently: Take 25 mg  by mouth 3 (three) times daily.  02/29/16  Yes Ghimire, Werner Lean, MD  metFORMIN (GLUCOPHAGE) 1000 MG tablet Take 1,000 mg by mouth every evening.    Yes [provider]  oxyCODONE (OXY IR/ROXICODONE) 5 MG immediate release tablet Take 5 mg by mouth 2 (two) times daily.   Yes [provider]  pantoprazole (PROTONIX) 40 MG tablet Take 1 tablet (40 mg total) by mouth daily. 01/19/16  Yes Richarda Overlie, MD  polyethylene glycol (MIRALAX / GLYCOLAX) packet Take 17 g by mouth daily. Mix with 8 ounces of liquid and drink/Hold for loose stools   Yes [provider]  risperiDONE (RISPERDAL) 0.5 MG tablet Take 0.5 mg by mouth daily.   Yes [provider]  tamsulosin (FLOMAX) 0.4 MG CAPS capsule Take 0.4 mg by mouth daily.  02/12/15  Yes [provider]    Physical Exam: Vitals:   12/19/17 1430 12/19/17 1500 12/19/17 1530 12/19/17 1553  BP: 137/63 (!) 147/63 (!) 164/63 (!) 164/63  Pulse: (!) 50 (!) 50 (!) 50 (!) 50  Resp: 16 19 14 16   Temp:      TempSrc:      SpO2: 95% 95% 96% 98%    Constitutional: NAD, calm, comfortable Eyes: PERRL, lids and conjunctivae normal ENMT: Mucous membranes are dry. Posterior pharynx clear of any exudate or lesions.Normal dentition.  Neck: normal, supple, no masses, no thyromegaly Respiratory: clear to auscultation bilaterally, no wheezing, no crackles. Normal respiratory effort. No accessory muscle use.  Cardiovascular: Regular rhythm, bradycardic, systolic murmur heard. No extremity edema. Abdomen: no tenderness, no masses palpated. No hepatosplenomegaly. Bowel sounds positive.  Musculoskeletal: no clubbing / cyanosis. No joint deformity upper and lower extremities. Good ROM, no contractures. Normal muscle tone.  Able to move bilateral lower extremities without pain.  No pain to palpation of hip joint or knee joint bilaterally Skin: no rashes, lesions, ulcers. No induration Neurologic: CN 2-12 grossly intact.  Strength 5/5 in  all 4.  Psychiatric: Poor judgment and insight.  History of dementia  Labs on Admission: I have personally reviewed following labs and imaging studies  CBC: Recent Labs  Lab 12/19/17 1248  WBC 5.5  NEUTROABS 4.0  HGB 9.7*  HCT 31.6*  MCV 79.4  PLT 182   Basic Metabolic Panel: Recent Labs  Lab 12/19/17 1248  NA 146*  K 3.8  CL 110  CO2 26  GLUCOSE 129*  BUN 33*  CREATININE 1.77*  CALCIUM 9.6   GFR: CrCl cannot be calculated (Unknown ideal weight.). Liver Function Tests: Recent Labs  Lab 12/19/17 1248  AST 21  ALT 14*  ALKPHOS 37*  BILITOT 0.9  PROT 7.3  ALBUMIN 3.9   No results for input(s): LIPASE, AMYLASE in the last 168 hours. No results for input(s):  AMMONIA in the last 168 hours. Coagulation Profile: No results for input(s): INR, PROTIME in the last 168 hours. Cardiac Enzymes: Recent Labs  Lab 12/19/17 1248  CKTOTAL 106   BNP (last 3 results) No results for input(s): PROBNP in the last 8760 hours. HbA1C: No results for input(s): HGBA1C in the last 72 hours. CBG: No results for input(s): GLUCAP in the last 168 hours. Lipid Profile: No results for input(s): CHOL, HDL, LDLCALC, TRIG, CHOLHDL, LDLDIRECT in the last 72 hours. Thyroid Function Tests: No results for input(s): TSH, T4TOTAL, FREET4, T3FREE, THYROIDAB in the last 72 hours. Anemia Panel: No results for input(s): VITAMINB12, FOLATE, FERRITIN, TIBC, IRON, RETICCTPCT in the last 72 hours. Urine analysis:    Component Value Date/Time   COLORURINE STRAW (A) 04/20/2017 1702   APPEARANCEUR CLEAR 04/20/2017 1702   LABSPEC 1.008 04/20/2017 1702   PHURINE 5.0 04/20/2017 1702   GLUCOSEU NEGATIVE 04/20/2017 1702   HGBUR NEGATIVE 04/20/2017 1702   BILIRUBINUR NEGATIVE 04/20/2017 1702   KETONESUR NEGATIVE 04/20/2017 1702   PROTEINUR NEGATIVE 04/20/2017 1702   UROBILINOGEN 0.2 03/01/2015 1940   NITRITE NEGATIVE 04/20/2017 1702   LEUKOCYTESUR NEGATIVE 04/20/2017 1702   Sepsis Labs:  !!!!!!!!!!!!!!!!!!!!!!!!!!!!!!!!!!!!!!!!!!!! @LABRCNTIP (procalcitonin:4,lacticidven:4) )No results found for this or any previous visit (from the past 240 hour(s)).   Radiological Exams on Admission: Dg Chest 2 View  Result Date: 12/19/2017 CLINICAL DATA:  Chest pain EXAM: CHEST - 2 VIEW COMPARISON:  10/02/2017 FINDINGS: Cardiac enlargement. Aortic valve replacement. Single lead pacemaker unchanged. Mild vascular congestion. Negative for edema or effusion. Negative for pneumonia IMPRESSION: Mild vascular congestion without edema or infiltrate. Electronically Signed   By: Marlan Palauharles  Clark M.D.   On: 12/19/2017 12:55   Ct Head Wo Contrast  Result Date: 12/19/2017 CLINICAL DATA:  Headaches EXAM: CT HEAD WITHOUT CONTRAST TECHNIQUE: Contiguous axial images were obtained from the base of the skull through the vertex without intravenous contrast. COMPARISON:  01/16/2016 FINDINGS: Brain: Diffuse atrophic changes are noted. Old left cerebellar infarct is noted. Chronic white matter ischemic changes are again seen similar to that noted on the prior exam. No findings to suggest acute hemorrhage, acute infarction or space-occupying mass lesion are noted. Vascular: No hyperdense vessel or unexpected calcification. Skull: Normal. Negative for fracture or focal lesion. Sinuses/Orbits: No acute finding. Other: None. IMPRESSION: Chronic atrophic and ischemic changes stable from the prior exam. Electronically Signed   By: Alcide CleverMark  Lukens M.D.   On: 12/19/2017 12:26    EKG: Independently reviewed.  Junctional rhythm, left bundle branch block, T wave inversion lateral leads.  No significant change since EKG April 2019.  Assessment/Plan Principal Problem:   Chest pain Active Problems:   Dyslipidemia   Essential hypertension   Diabetes mellitus, type 2 (HCC)   Chronic diastolic CHF (congestive heart failure) (HCC)   CKD (chronic kidney disease) stage 3, GFR 30-59 ml/min (HCC)   Dementia   Atypical chest pain with  mildly elevated troponin -Trend troponin -Echocardiogram  Hemoptysis  -Check CT chest    Chronic diastolic heart failure -Without exacerbation -Continue home Lasix  CKD stage III -Baseline creatinine 1.6  -Stable   Hyperlipidemia -Continue Lipitor  Depression -Continue Celexa  Diabetes mellitus -Hold glipizide, metformin -Sliding scale insulin  Hypertension -Continue hydralazine  GERD -Continue Protonix  Dementia -Continue Risperdal     DVT prophylaxis: SCD Code Status: Full  Family Communication: No family at bedside Disposition Plan: Back to SNF once stable Consults called: None  Admission status: Observation   Severity of Illness:  The appropriate patient status for this patient is OBSERVATION. Observation status is judged to be reasonable and necessary in order to provide the required intensity of service to ensure the patient's safety. The patient's presenting symptoms, physical exam findings, and initial radiographic and laboratory data in the context of their medical condition is felt to place them at decreased risk for further clinical deterioration. Furthermore, it is anticipated that the patient will be medically stable for discharge from the hospital within 2 midnights of admission.   Noralee Stain, DO [facility] [email or web address] Password [email or web address] 12/19/2017, 4:30 PM

## 2017-12-19 NOTE — ED Notes (Signed)
Bed: WA12 Expected date:  Expected time:  Means of arrival:  Comments: EMS abdominal pain 

## 2017-12-19 NOTE — Progress Notes (Signed)
CRITICAL VALUE ALERT  Critical Value:  0.12  Date & Time Notied: 12/19/17   1812  Provider Notified: Dr. Alvino Chapelhoi  Orders Received/Actions taken: no action taken continue to monitor

## 2017-12-19 NOTE — ED Notes (Signed)
ED TO INPATIENT HANDOFF REPORT  Name/Age/Gender Harold Jones 84 y.o. male  Code Status Code Status History    Date Active Date Inactive Code Status Order ID Comments User Context   04/25/2016 0957 04/25/2016 1820 DNR [id number]  Fredia Sorrow, MD ED   02/27/2016 2017 02/29/2016 1905 DNR [id number]  Janece Canterbury, MD Inpatient   01/12/2016 1339 01/19/2016 1946 Full Code [id number]  Tomma Rakers, MD ED   03/01/2015 2324 03/07/2015 2119 Full Code [id number]  Lavina Hamman, MD ED   11/12/2013 1453 11/14/2013 1748 Full Code [id number]  Thurnell Lose, MD Inpatient   06/23/2013 1822 06/25/2013 2201 Full Code [id number]  Cristal Ford, DO Inpatient   10/08/2011 1833 10/12/2011 1630 Full Code [id number]  Lindalou Hose, RN Inpatient    Questions for Most Recent Historical Code Status (Order [id number])    Question Answer Comment   In the event of cardiac or respiratory ARREST Do not call a "code blue"    In the event of cardiac or respiratory ARREST Do not perform Intubation, CPR, defibrillation or ACLS    In the event of cardiac or respiratory ARREST Use medication by any route, position, wound care, and other measures to relive pain and suffering. May use oxygen, suction and manual treatment of airway obstruction as needed for comfort.       Home/SNF/Other Nursing Home  Chief Complaint Abd Pain  Level of Care/Admitting Diagnosis ED Disposition    ED Disposition Condition Comment   Admit  Hospital Area: [facility] [[id number]]  Level of Care: Telemetry [5]  Admit to tele based on following criteria: Monitor for Ischemic changes  Diagnosis: Chest pain [737106]  Admitting Physician: Dessa Phi [[id number]]  Attending Physician: Dessa Phi [phone]  PT Class (Do Not Modify): Observation [104]  PT Acc Code (Do Not Modify): Observation [10022]       Medical History Past Medical History:  Diagnosis Date  . Aortic valve disorder   . BPH without  obstruction/lower urinary tract symptoms   . Chest wall pain following surgery   . COGNITIVE DEFICITS DUE CEREBROVASCULAR DISEASE 09/09/2009   Qualifier: Diagnosis of  By: Amil Amen MD, Benjamine Mola    . CORONARY ARTERY DISEASE 02/27/2007   Qualifier: Diagnosis of  By: Radene Ou MD, [place]    . CVA 02/28/2004   Annotation: embolic Qualifier: Diagnosis of  By: Radene Ou MD, [place]    . Dementia    Per paper work from [employer]  . Diabetes mellitus   . DYSLIPIDEMIA 02/27/2007   Qualifier: Diagnosis of  By: Radene Ou MD, [place]    . FIBRILLATION, ATRIAL 08/31/2002   Qualifier: Diagnosis of  By: Radene Ou MD, [place]    . Grief   . Hypertension   . Neuropathic pain   . Osteoarthritis of left knee   . Physical deconditioning   . Symptomatic bradycardia 08/16/15   [employer] PPM, Dr. Lovena Le    Allergies No Known Allergies  IV Location/Drains/Wounds Patient Lines/Drains/Airways Status   Active Line/Drains/Airways    Name:   Placement date:   Placement time:   Site:   Days:   Peripheral IV 12/19/17 Right Antecubital   12/19/17    1249    Antecubital   less than 1          Labs/Imaging Results for orders placed or performed during the hospital encounter of 12/19/17 (from the past 48 hour(s))  Comprehensive metabolic panel     Status: Abnormal   Collection Time: 12/19/17 12:48 PM  Result Value Ref Range   Sodium 146 (H) 135 - 145 mmol/L   Potassium 3.8 3.5 - 5.1 mmol/L   Chloride 110 101 - 111 mmol/L   CO2 26 22 - 32 mmol/L   Glucose, Bld 129 (H) 65 - 99 mg/dL   BUN 33 (H) 6 - 20 mg/dL   Creatinine, Ser 1.77 (H) 0.61 - 1.24 mg/dL   Calcium 9.6 8.9 - 10.3 mg/dL   Total Protein 7.3 6.5 - 8.1 g/dL   Albumin 3.9 3.5 - 5.0 g/dL   AST 21 15 - 41 U/L   ALT 14 (L) 17 - 63 U/L   Alkaline Phosphatase 37 (L) 38 - 126 U/L   Total Bilirubin 0.9 0.3 - 1.2 mg/dL   GFR calc non Af Amer 33 (L) >60 mL/min   GFR calc Af Amer 38 (L) >60 mL/min    Comment: (NOTE) The eGFR has been  calculated using the CKD EPI equation. This calculation has not been validated in all clinical situations. eGFR's persistently <60 mL/min signify possible Chronic Kidney Disease.    Anion gap 10 5 - 15    Comment: Performed at [facility], [place] [address] [place], [place] [address]  CBC with Differential     Status: Abnormal   Collection Time: 12/19/17 12:48 PM  Result Value Ref Range   WBC 5.5 4.0 - 10.5 K/uL   RBC 3.98 (L) 4.22 - 5.81 MIL/uL   Hemoglobin 9.7 (L) 13.0 - 17.0 g/dL   HCT 31.6 (L) 39.0 - 52.0 %   MCV 79.4 78.0 - 100.0 fL   MCH 24.4 (L) 26.0 - 34.0 pg   MCHC 30.7 30.0 - 36.0 g/dL   RDW 16.4 (H) 11.5 - 15.5 %   Platelets 182 150 - 400 K/uL   Neutrophils Relative % 74 %   Neutro Abs 4.0 1.7 - 7.7 K/uL   Lymphocytes Relative 16 %   Lymphs Abs 0.9 0.7 - 4.0 K/uL   Monocytes Relative 10 %   Monocytes Absolute 0.6 0.1 - 1.0 K/uL   Eosinophils Relative 0 %   Eosinophils Absolute 0.0 0.0 - 0.7 K/uL   Basophils Relative 0 %   Basophils Absolute 0.0 0.0 - 0.1 K/uL    Comment: Performed at [facility], [place] [address], [place], [place] [address]  CK     Status: None   Collection Time: 12/19/17 12:48 PM  Result Value Ref Range   Total CK 106 49 - 397 U/L    Comment: Performed at [facility], [place] [address] [place], [place] [address]  Sedimentation rate     Status: Abnormal   Collection Time: 12/19/17 12:48 PM  Result Value Ref Range   Sed Rate 18 (H) 0 - 16 mm/hr    Comment: Performed at [facility], [place] [address] [place],  [address]  I-stat troponin, ED     Status: Abnormal   Collection Time: 12/19/17 12:54 PM  Result Value Ref Range   Troponin i, poc 0.11 (HH) 0.00 - 0.08 ng/mL   Comment NOTIFIED PHYSICIAN    Comment 3            Comment: Due to the release kinetics of cTnI, a negative result within the first hours of the onset of symptoms does not rule out myocardial  infarction with certainty. If myocardial infarction is still suspected, repeat the test at appropriate intervals.    Dg Chest 2 View  Result Date:  12/19/2017 CLINICAL DATA:  Chest pain EXAM: CHEST - 2 VIEW COMPARISON:  10/02/2017 FINDINGS: Cardiac enlargement. Aortic valve replacement. Single lead pacemaker unchanged. Mild vascular congestion. Negative for edema or effusion. Negative for pneumonia IMPRESSION: Mild vascular congestion without edema or infiltrate. Electronically Signed   By: Franchot Gallo M.D.   On: 12/19/2017 12:55   Ct Head Wo Contrast  Result Date: 12/19/2017 CLINICAL DATA:  Headaches EXAM: CT HEAD WITHOUT CONTRAST TECHNIQUE: Contiguous axial images were obtained from the base of the skull through the vertex without intravenous contrast. COMPARISON:  01/16/2016 FINDINGS: Brain: Diffuse atrophic changes are noted. Old left cerebellar infarct is noted. Chronic white matter ischemic changes are again seen similar to that noted on the prior exam. No findings to suggest acute hemorrhage, acute infarction or space-occupying mass lesion are noted. Vascular: No hyperdense vessel or unexpected calcification. Skull: Normal. Negative for fracture or focal lesion. Sinuses/Orbits: No acute finding. Other: None. IMPRESSION: Chronic atrophic and ischemic changes stable from the prior exam. Electronically Signed   By: Inez Catalina M.D.   On: 12/19/2017 12:26    Pending Labs Unresulted Labs (From admission, onward)   None      Vitals/Pain Today's Vitals   12/19/17 1430 12/19/17 1500 12/19/17 1530 12/19/17 1553  BP: 137/63 (!) 147/63 (!) 164/63 (!) 164/63  Pulse: (!) 50 (!) 50 (!) 50 (!) 50  Resp: '16 19 14 16  ' Temp:      TempSrc:      SpO2: 95% 95% 96% 98%  PainSc:        Isolation Precautions No active isolations  Medications Medications  acetaminophen (TYLENOL) tablet 650 mg (650 mg Oral Given 12/19/17 1249)  aspirin chewable tablet 324 mg (324 mg Oral Given 12/19/17 1322)     Mobility manual wheelchair

## 2017-12-19 NOTE — ED Notes (Signed)
MD AND RN NOTIFIED OF PATIENT'S TROPONIN LEVEL OF 0.11

## 2017-12-19 NOTE — ED Provider Notes (Signed)
[facility]-EMERGENCY DEPT Provider Note   CSN: [id number] Arrival date & time: 12/19/17  1021  LEVEL 5 CAVEAT - DEMENTIA, HARD OF HEARING, LANGUAGE BARRIER   History   Chief Complaint Chief Complaint  Patient presents with  . Abdominal Pain    HPI Harold Jones is a 84 y.o. male.  HPI  84 year old male complains of chest pain.  He is unable to describe what the pain is like or for how long.  He states 75-year-old male presents with chest pain.  The history is extremely limited given his history of dementia, he only speaks [place], and is very hard of hearing.  I used a Midwife.  That he is old and has had pain for a long time.  He also complains of bilateral leg pain and a frontal headache.  These also seem to been for a long time but it is unclear for how long.  He denies abdominal pain or shortness of breath.  The history is quite limited.  Past Medical History:  Diagnosis Date  . Aortic valve disorder   . BPH without obstruction/lower urinary tract symptoms   . Chest wall pain following surgery   . COGNITIVE DEFICITS DUE CEREBROVASCULAR DISEASE 09/09/2009   Qualifier: Diagnosis of  By: Delrae Alfred MD, Lanora Manis    . CORONARY ARTERY DISEASE 02/27/2007   Qualifier: Diagnosis of  By: Barbaraann Barthel MD, [place]    . CVA 02/28/2004   Annotation: embolic Qualifier: Diagnosis of  By: Barbaraann Barthel MD, [place]    . Dementia    Per paper work from [employer]  . Diabetes mellitus   . DYSLIPIDEMIA 02/27/2007   Qualifier: Diagnosis of  By: Barbaraann Barthel MD, [place]    . FIBRILLATION, ATRIAL 08/31/2002   Qualifier: Diagnosis of  By: Barbaraann Barthel MD, [place]    . Grief   . Hypertension   . Neuropathic pain   . Osteoarthritis of left knee   . Physical deconditioning   . Symptomatic bradycardia 08/16/15   [employer] PPM, Dr. Ladona Ridgel    Patient Active Problem List   Diagnosis Date Noted  . Chest pain 12/19/2017  . CHF exacerbation (HCC) 02/27/2016  . Altered  mental state   . Confusion   . Acute GI bleeding   . GI bleed 01/12/2016  . GIB (gastrointestinal bleeding) 01/12/2016  . Symptomatic bradycardia 08/15/2015  . Intraventricular conduction delay 08/15/2015  . S/P aortic valve replacement with bioprosthetic valve 08/15/2015  . Atrial fibrillation with slow ventricular response (HCC) 08/15/2015  . Severe sepsis (HCC) 05/30/2015  . Acute respiratory failure (HCC) 05/30/2015  . History of stroke 03/15/2015  . Benign hypertensive heart disease without heart failure 03/08/2015  . 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 BPH without obstruction/lower urinary tract symptoms 03/08/2015  . Post herpetic neuralgia 03/08/2015  . CAP (community acquired pneumonia) 03/01/2015  . Syncope and collapse 11/12/2013  . Nasal fracture 11/12/2013  . Encephalopathy acute 06/23/2013  . Diabetes mellitus, type 2 (HCC) 06/23/2013  . Hypoglycemia 06/23/2013  . Supratherapeutic INR 06/23/2013  . Zoster 10/12/2011  . Altered mental status 10/08/2011  . GLAUCOMA 09/08/2010  . KNEE PAIN, LEFT 11/22/2009  . 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 CAROTID BRUIT, RIGHT 01/18/2009  . GASTROINTESTINAL HEMORRHAGE 09/26/2007  . OSTEOARTHRITIS 03/18/2007  . Dyslipidemia 02/27/2007  . Coronary atherosclerosis 02/27/2007  . Essential hypertension 02/25/2007  . Cerebral artery occlusion with cerebral infarction (HCC) 02/28/2004  . ASCENDING AORTIC  ANEURYSM 02/28/2004  . H/O aortic valve replacement 02/28/2004  . FIBRILLATION, ATRIAL 08/31/2002    Past Surgical History:  Procedure Laterality Date  . CARDIAC SURGERY     bioprosthetic AV replacement  . EP IMPLANTABLE DEVICE N/A 08/16/2015   [facility] PPM, Dr. Ladona Ridgel  . ESOPHAGOGASTRODUODENOSCOPY (EGD) WITH PROPOFOL N/A 01/17/2016   Procedure: ESOPHAGOGASTRODUODENOSCOPY (EGD) WITH PROPOFOL;  Surgeon: Bernette Redbird, MD;  Location: [facility]  ENDOSCOPY;  Service: Endoscopy;  Laterality: N/A;        Home Medications    Prior to Admission medications   Medication Sig Start Date End Date Taking? Authorizing Provider  acetaminophen (TYLENOL) 500 MG tablet Take 1,000 mg by mouth 3 (three) times daily.   Yes [provider]  aspirin 81 MG chewable tablet Chew 1 tablet (81 mg total) by mouth daily. 02/29/16  Yes Ghimire, Werner Lean, MD  atorvastatin (LIPITOR) 10 MG tablet Take 10 mg by mouth daily.   Yes [provider]  citalopram (CELEXA) 10 MG tablet Take 10 mg by mouth daily.   Yes [provider]  docusate sodium (COLACE) 100 MG capsule Take 100 mg by mouth 2 (two) times daily.   Yes [provider]  furosemide (LASIX) 20 MG tablet Take 2 tablets (40 mg total) by mouth daily. Reported on 08/17/2015 03/01/16  Yes Ghimire, Werner Lean, MD  gabapentin (NEURONTIN) 300 MG capsule Take 300 mg by mouth 2 (two) times daily.   Yes [provider]  glipiZIDE (GLUCOTROL) 5 MG tablet Take 5 mg by mouth 2 (two) times daily.  11/14/13  Yes Buriev, Isaiah Serge, MD  hydrALAZINE (APRESOLINE) 25 MG tablet Take 1 tablet (25 mg total) by mouth 2 (two) times daily. Patient taking differently: Take 25 mg by mouth 3 (three) times daily.  02/29/16  Yes Ghimire, Werner Lean, MD  metFORMIN (GLUCOPHAGE) 1000 MG tablet Take 1,000 mg by mouth every evening.    Yes [provider]  oxyCODONE (OXY IR/ROXICODONE) 5 MG immediate release tablet Take 5 mg by mouth 2 (two) times daily.   Yes [provider]  pantoprazole (PROTONIX) 40 MG tablet Take 1 tablet (40 mg total) by mouth daily. 01/19/16  Yes Richarda Overlie, MD  polyethylene glycol (MIRALAX / GLYCOLAX) packet Take 17 g by mouth daily. Mix with 8 ounces of liquid and drink/Hold for loose stools   Yes [provider]  risperiDONE (RISPERDAL) 0.5 MG tablet Take 0.5 mg by mouth daily.   Yes [provider]  tamsulosin (FLOMAX) 0.4 MG CAPS capsule  Take 0.4 mg by mouth daily.  02/12/15  Yes [provider]    Family History Family History  Problem Relation Age of Onset  . Other Mother        old age  . CAD Father   . CAD Brother     Social History Social History   Tobacco Use  . Smoking status: Never Smoker  . Smokeless tobacco: Never Used  Substance Use Topics  . Alcohol use: No  . Drug use: No     Allergies   Patient has no known allergies.   Review of Systems Review of Systems  Unable to perform ROS: Dementia     Physical Exam Updated Vital Signs BP (!) 164/63 (BP Location: Left Arm)   Pulse (!) 50   Temp 98.2 F (36.8 C) (Oral)   Resp 16   SpO2 98%   Physical Exam  Constitutional: He appears well-developed and well-nourished.  Non-toxic appearance. He  does not appear ill. No distress.  HENT:  Head: Normocephalic and atraumatic.  Right Ear: External ear normal.  Left Ear: External ear normal.  Nose: Nose normal.  Eyes: Pupils are equal, round, and reactive to light. EOM are normal. Right eye exhibits no discharge. Left eye exhibits no discharge.  Neck: Neck supple. No neck rigidity. Normal range of motion present.  Cardiovascular: Regular rhythm and normal heart sounds. Bradycardia present.  Pulmonary/Chest: Effort normal and breath sounds normal.  Abdominal: Soft. There is no tenderness.  Musculoskeletal: He exhibits no edema.  Neurological: He is alert.  Awake, alert, oriented to self. Does not know time/day of week/month/year. CN 3-12 grossly intact. 5/5 strength in all 4 extremities. Grossly normal sensation.  Skin: Skin is warm and dry.  Nursing note and vitals reviewed.    ED Treatments / Results  Labs (all labs ordered are listed, but only abnormal results are displayed) Labs Reviewed  COMPREHENSIVE METABOLIC PANEL - Abnormal; Notable for the following components:      Result Value   Sodium 146 (*)    Glucose, Bld 129 (*)    BUN 33 (*)    Creatinine, Ser 1.77 (*)    ALT  14 (*)    Alkaline Phosphatase 37 (*)    GFR calc non Af Amer 33 (*)    GFR calc Af Amer 38 (*)    All other components within normal limits  CBC WITH DIFFERENTIAL/PLATELET - Abnormal; Notable for the following components:   RBC 3.98 (*)    Hemoglobin 9.7 (*)    HCT 31.6 (*)    MCH 24.4 (*)    RDW 16.4 (*)    All other components within normal limits  SEDIMENTATION RATE - Abnormal; Notable for the following components:   Sed Rate 18 (*)    All other components within normal limits  I-STAT TROPONIN, ED - Abnormal; Notable for the following components:   Troponin i, poc 0.11 (*)    All other components within normal limits  CK    EKG EKG Interpretation  Date/Time:  Thursday December 19 2017 12:03:07 EDT Ventricular Rate:  50 PR Interval:    QRS Duration: 178 QT Interval:  534 QTC Calculation: 487 R Axis:   -58 Text Interpretation:  Junctional rhythm vs more likely afib Left bundle branch block no significant change since APril 2019 Confirmed by Pricilla Loveless [phone]) on 12/19/2017 12:24:21 PM Also confirmed by Pricilla Loveless [phone]), editor Sheppard Evens ([id number])  on 12/19/2017 3:09:01 PM   Radiology Dg Chest 2 View  Result Date: 12/19/2017 CLINICAL DATA:  Chest pain EXAM: CHEST - 2 VIEW COMPARISON:  10/02/2017 FINDINGS: Cardiac enlargement. Aortic valve replacement. Single lead pacemaker unchanged. Mild vascular congestion. Negative for edema or effusion. Negative for pneumonia IMPRESSION: Mild vascular congestion without edema or infiltrate. Electronically Signed   By: Marlan Palau M.D.   On: 12/19/2017 12:55   Ct Head Wo Contrast  Result Date: 12/19/2017 CLINICAL DATA:  Headaches EXAM: CT HEAD WITHOUT CONTRAST TECHNIQUE: Contiguous axial images were obtained from the base of the skull through the vertex without intravenous contrast. COMPARISON:  01/16/2016 FINDINGS: Brain: Diffuse atrophic changes are noted. Old left cerebellar infarct is noted. Chronic white matter ischemic  changes are again seen similar to that noted on the prior exam. No findings to suggest acute hemorrhage, acute infarction or space-occupying mass lesion are noted. Vascular: No hyperdense vessel or unexpected calcification. Skull: Normal. Negative for fracture or focal lesion. Sinuses/Orbits: No acute finding.  Other: None. IMPRESSION: Chronic atrophic and ischemic changes stable from the prior exam. Electronically Signed   By: Alcide CleverMark  Lukens M.D.   On: 12/19/2017 12:26    Procedures Procedures (including critical care time)  Medications Ordered in ED Medications  acetaminophen (TYLENOL) tablet 650 mg (650 mg Oral Given 12/19/17 1249)  aspirin chewable tablet 324 mg (324 mg Oral Given 12/19/17 1322)     Initial Impression / Assessment and Plan / ED Course  I have reviewed the triage vital signs and the nursing notes.  Pertinent labs & imaging results that were available during my care of the patient were reviewed by me and considered in my medical decision making (see chart for details).     Elevated troponin is nonspecific given the low level.  The history is very limited, not just because of his language barrier, but because the patient does not answer questions such as what the pain feels like her exactly how long is been going on.  Thus I think he will need further troponin evaluation and ACS rule out.  I doubt PE given the no hypoxia or increased work of breathing or complaints of shortness of breath.  He originally came in with possible abdominal pain but has no abdominal tenderness on my exam and denies this to me.  Thus I think he needs admission for observation and further evaluation.  Dr. Alvino Chapelhoi to admit.  I talked to the granddaughter who is listed as first contact and she advises he is a full code but would not want prolonged life support. Given ASA  Final Clinical Impressions(s) / ED Diagnoses   Final diagnoses:  Nonspecific chest pain  Elevated troponin    ED Discharge Orders     None       Pricilla LovelessGoldston, Mckena Chern, MD 12/19/17 1556

## 2017-12-19 NOTE — Progress Notes (Signed)
CRITICAL VALUE ALERT  Critical Value:  Troponin 0.09  Date & Time Notied:  6/20 2350  Provider Notified: Rana SnareBodenheimer, NP  Orders Received/Actions taken: continue to monitor.

## 2017-12-20 ENCOUNTER — Observation Stay (HOSPITAL_BASED_OUTPATIENT_CLINIC_OR_DEPARTMENT_OTHER): Payer: Medicare Other

## 2017-12-20 DIAGNOSIS — I130 Hypertensive heart and chronic kidney disease with heart failure and stage 1 through stage 4 chronic kidney disease, or unspecified chronic kidney disease: Secondary | ICD-10-CM | POA: Diagnosis not present

## 2017-12-20 DIAGNOSIS — R748 Abnormal levels of other serum enzymes: Secondary | ICD-10-CM | POA: Diagnosis not present

## 2017-12-20 DIAGNOSIS — N183 Chronic kidney disease, stage 3 (moderate): Secondary | ICD-10-CM | POA: Diagnosis not present

## 2017-12-20 DIAGNOSIS — E785 Hyperlipidemia, unspecified: Secondary | ICD-10-CM | POA: Diagnosis not present

## 2017-12-20 DIAGNOSIS — I340 Nonrheumatic mitral (valve) insufficiency: Secondary | ICD-10-CM

## 2017-12-20 DIAGNOSIS — I351 Nonrheumatic aortic (valve) insufficiency: Secondary | ICD-10-CM

## 2017-12-20 DIAGNOSIS — R0789 Other chest pain: Secondary | ICD-10-CM

## 2017-12-20 DIAGNOSIS — I10 Essential (primary) hypertension: Secondary | ICD-10-CM

## 2017-12-20 DIAGNOSIS — F0391 Unspecified dementia with behavioral disturbance: Secondary | ICD-10-CM | POA: Diagnosis not present

## 2017-12-20 DIAGNOSIS — E1122 Type 2 diabetes mellitus with diabetic chronic kidney disease: Secondary | ICD-10-CM | POA: Diagnosis not present

## 2017-12-20 LAB — BASIC METABOLIC PANEL
ANION GAP: 11 (ref 5–15)
BUN: 33 mg/dL — ABNORMAL HIGH (ref 6–20)
CO2: 28 mmol/L (ref 22–32)
Calcium: 9.7 mg/dL (ref 8.9–10.3)
Chloride: 106 mmol/L (ref 101–111)
Creatinine, Ser: 1.87 mg/dL — ABNORMAL HIGH (ref 0.61–1.24)
GFR calc non Af Amer: 30 mL/min — ABNORMAL LOW (ref >60)
GFR, EST AFRICAN AMERICAN: 35 mL/min — AB (ref >60)
Glucose, Bld: 129 mg/dL — ABNORMAL HIGH (ref 65–99)
POTASSIUM: 3.7 mmol/L (ref 3.5–5.1)
SODIUM: 145 mmol/L (ref 135–145)

## 2017-12-20 LAB — GLUCOSE, CAPILLARY
Glucose-Capillary: 148 mg/dL — ABNORMAL HIGH (ref 65–99)
Glucose-Capillary: 152 mg/dL — ABNORMAL HIGH (ref 65–99)
Glucose-Capillary: 119 mg/dL — ABNORMAL HIGH (ref 65–99)

## 2017-12-20 LAB — TROPONIN I: TROPONIN I: 0.07 ng/mL — AB (ref <0.03)

## 2017-12-20 LAB — CBC
HCT: 32.4 % — ABNORMAL LOW (ref 39.0–52.0)
Hemoglobin: 9.9 g/dL — ABNORMAL LOW (ref 13.0–17.0)
MCH: 24.1 pg — ABNORMAL LOW (ref 26.0–34.0)
MCHC: 30.6 g/dL (ref 30.0–36.0)
MCV: 78.8 fL (ref 78.0–100.0)
PLATELETS: 193 10*3/uL (ref 150–400)
RBC: 4.11 MIL/uL — AB (ref 4.22–5.81)
RDW: 16.3 % — ABNORMAL HIGH (ref 11.5–15.5)
WBC: 5.2 10*3/uL (ref 4.0–10.5)

## 2017-12-20 LAB — ECHOCARDIOGRAM COMPLETE
HEIGHTINCHES: 73 in
Weight: 2878.33 oz

## 2017-12-20 LAB — CK: Total CK: 158 U/L (ref 49–397)

## 2017-12-20 MED ORDER — MORPHINE SULFATE (PF) 2 MG/ML IV SOLN
1.0000 mg | INTRAVENOUS | Status: DC | PRN
  Administered 2017-12-20: 1 mg via INTRAVENOUS
  Filled 2017-12-20: qty 1

## 2017-12-20 MED ORDER — NITROGLYCERIN 0.4 MG SL SUBL: 0.4000 mg | SUBLINGUAL_TABLET | SUBLINGUAL | Status: DC | PRN

## 2017-12-20 MED ORDER — OXYCODONE HCL 5 MG PO TABS
5.0000 mg | ORAL_TABLET | Freq: Two times a day (BID) | ORAL | Status: DC
  Administered 2017-12-20 – 2017-12-21 (×3): 5 mg via ORAL
  Filled 2017-12-20 (×3): qty 1

## 2017-12-20 NOTE — Progress Notes (Signed)
Pt resting and more relaxed able to eat lunch and nodding head yes, when pain asked better. MD made aware of outcome, Will cont to monitor. SRP, RN

## 2017-12-20 NOTE — Progress Notes (Signed)
CRITICAL VALUE ALERT  Critical Value:  Troponin 0.07  Date & Time Notied:  6/21 0500  Provider Notified: Rana SnareBodenheimer, NP  Orders Received/Actions taken: no new orders at this time

## 2017-12-20 NOTE — Progress Notes (Signed)
  Echocardiogram 2D Echocardiogram has been performed.  Hillary Schwegler T Katheren Jimmerson 12/20/2017, 2:36 PM

## 2017-12-20 NOTE — Progress Notes (Signed)
Pt continue to complain of chest pain  With guarding on left, MD made aware during rounding plan of care discussed. OX IR and Morphine 1 mg given as ordered. O2 placed and EKG completed as ordered. Will cont to monitor. SRP, RN

## 2017-12-20 NOTE — Progress Notes (Signed)
PROGRESS NOTE    Harold Jones  WUJ:[id number] DOB: Apr 27, 1928 DOA: 12/19/2017 PCP: Mortimer Fries, PA    Brief Narrative:  84 year old male who presented with chest pain.  He does have the significant past medical history for coronary artery disease, dyslipidemia, dementia, type 2 diabetes mellitus and chronic kidney disease stage III (baseline GFR 35).  Patient had a language barrier of speaking [place], per interpreter he had chest pain for about 24 hours, along with lower extremity pain.  Unable to get further details due to his dementia.  On the initial physical examination blood pressure 137/63, heart rate 50, respiratory rate 19, oxygen saturation 95%.  Mucous membranes were dry, his lungs were clear to auscultation bilaterally, heart S1-S2 present rhythmic, bradycardic, abdomen was soft nontender, no lower extremity edema.  Sodium 146, potassium 3.8, chloride 110, bicarb 26, glucose 129, BUN 33 creatinine 1.77, troponin 0.12, white count 5.5, hemoglobin 9.7, hematocrit 31.6, platelets 182.  His chest x-ray was negative for infiltrates.  Chest CT with a 4.2 cm stable ascending aortic aneurysm.  Stable pulmonary nodules in the right apical pleuroparenchymal scarring.  EKG is mainly paced rhythm, left axis deviation, left bundle branch block, lateral T wave inversions.    Patient was admitted to the hospital with the working diagnosis of atypical chest pain to rule out acute coronary syndrome.  Assessment & Plan:   Principal Problem:   Chest pain Active Problems:   Dyslipidemia   Essential hypertension   Diabetes mellitus, type 2 (HCC)   Chronic diastolic CHF (congestive heart failure) (HCC)   CKD (chronic kidney disease) stage 3, GFR 30-59 ml/min (HCC)   Dementia   1. Atypical chest pain. Pain seems to be atypical no angina type, it is reproducible on palpation of the chest wall, he does have chronic lateral t wave inversions and ekg seems to be paced. Troponin are trending down (peak  0.12), note low GFR with ckd stage 3. Will follow on echocardiogram for wall motion abnormalities and will continue to follow on troponin. Patient had history of GI bleed not on anticoagulation. He has dementia and difficult to assess functional status. Pain control with analgesics, will add sl nitroglycerin and continue aspirin.   Follow ekg personally reviewed noted predominantly paced rhythm with few premature atrium and ventricular complexes. Chronic lateral t wave inversion.   2. CKD stage 3. Serum cr at 1,87, K at 3,7 and serum bicarbonate at 28, will continue to avoid nephrotoxic medications, or hypotension, follow on renal panel in am, clinically hypovolemic, will hold on furosemide for now.   3. T2DM. Will continue glucose cover and monitoring with insulin sliding scale, hold on oral hypoglycemic agents. Patient tolerating po well.   4. HTN. Blood pressure well controlled with tid hydralazine.   5. Chronic atrial fibrillation. Patient mainly on a paced rhythm, positive pac and pvc, not anticoagulated due to history of GI bleeding.   6. Coronary artery disease. Continue statin and asa therapy. Added nitroglycerin as needed.   7. Dementia. Continue risperidone.   DVT prophylaxis: scd  Code Status:  full Family Communication: no family at the bedside  Disposition Plan: pending echocardiogram and physical therapy    Consultants:     Procedures:     Antimicrobials:       Subjective: Patient looks in pain, complains of pain at the left precordial region, unable to get further details due to confusion. Interpreter used for interview.   Objective: Vitals:   12/20/17 0657 12/20/17 0700 12/20/17 1015  12/20/17 1100  BP: (!) 151/61  140/71   Pulse: 65  (!) 50   Resp: 20  (!) 32 (!) 24  Temp: 97.9 F (36.6 C)  98.2 F (36.8 C)   TempSrc: Oral  Axillary   SpO2: 96% 100% 99%   Weight:      Height:        Intake/Output Summary (Last 24 hours) at 12/20/2017 1315 Last data  filed at 12/20/2017 0600 Gross per 24 hour  Intake 120 ml  Output 0 ml  Net 120 ml   Filed Weights   12/19/17 1800  Weight: 81.6 kg (179 lb 14.3 oz)    Examination:   General: deconditioned and in pain.  Neurology: Awake and alert, non focal  E ENT: mild pallor, no icterus, oral mucosa moist Cardiovascular: No JVD. S1-S2 present, rhythmic, no gallops, rubs, or murmurs. No lower extremity edema. Pulmonary: decreased breath sounds bilaterally at bases, adequate air movement, no wheezing, rhonchi or rales. Gastrointestinal. Abdomen flat, no organomegaly, non tender, no rebound or guarding Skin. No rashes Musculoskeletal: no joint deformities/ reproducible chest pain upon palpation of the chest wall.      Data Reviewed: I have personally reviewed following labs and imaging studies  CBC: Recent Labs  Lab 12/19/17 1248 12/20/17 0457  WBC 5.5 5.2  NEUTROABS 4.0  --   HGB 9.7* 9.9*  HCT 31.6* 32.4*  MCV 79.4 78.8  PLT 182 193   Basic Metabolic Panel: Recent Labs  Lab 12/19/17 1248 12/20/17 0457  NA 146* 145  K 3.8 3.7  CL 110 106  CO2 26 28  GLUCOSE 129* 129*  BUN 33* 33*  CREATININE 1.77* 1.87*  CALCIUM 9.6 9.7   GFR: Estimated Creatinine Clearance: 30.9 mL/min (A) (by C-G formula based on SCr of 1.87 mg/dL (H)). Liver Function Tests: Recent Labs  Lab 12/19/17 1248  AST 21  ALT 14*  ALKPHOS 37*  BILITOT 0.9  PROT 7.3  ALBUMIN 3.9   No results for input(s): LIPASE, AMYLASE in the last 168 hours. No results for input(s): AMMONIA in the last 168 hours. Coagulation Profile: No results for input(s): INR, PROTIME in the last 168 hours. Cardiac Enzymes: Recent Labs  Lab 12/19/17 1248 12/19/17 1716 12/19/17 2248 12/20/17 0457  CKTOTAL 106  --   --  158  TROPONINI  --  0.12* 0.09* 0.07*   BNP (last 3 results) No results for input(s): PROBNP in the last 8760 hours. HbA1C: No results for input(s): HGBA1C in the last 72 hours. CBG: Recent Labs  Lab  12/19/17 1732 12/19/17 2201 12/20/17 0724 12/20/17 1203  GLUCAP 123* 149* 148* 152*   Lipid Profile: No results for input(s): CHOL, HDL, LDLCALC, TRIG, CHOLHDL, LDLDIRECT in the last 72 hours. Thyroid Function Tests: No results for input(s): TSH, T4TOTAL, FREET4, T3FREE, THYROIDAB in the last 72 hours. Anemia Panel: No results for input(s): VITAMINB12, FOLATE, FERRITIN, TIBC, IRON, RETICCTPCT in the last 72 hours.    Radiology Studies: I have reviewed all of the imaging during this hospital visit personally     Scheduled Meds: . aspirin  81 mg Oral Daily  . atorvastatin  10 mg Oral Daily  . citalopram  10 mg Oral Daily  . furosemide  40 mg Oral Daily  . gabapentin  300 mg Oral BID  . hydrALAZINE  25 mg Oral TID  . insulin aspart  0-9 Units Subcutaneous TID WC  . oxyCODONE  5 mg Oral BID  . pantoprazole  40 mg  Oral Daily  . polyethylene glycol  17 g Oral Daily  . risperiDONE  0.5 mg Oral Daily  . tamsulosin  0.4 mg Oral Daily   Continuous Infusions:   LOS: 0 days        Harold Lubrano Annett Gulaaniel Jaskirat Zertuche, MD [facility] Pager [phone]

## 2017-12-20 NOTE — Progress Notes (Signed)
Pt with increased confusion throughout the night. Pt pulled out IV and taken off telemetry several times. On call provider made aware of pt's new onset of agitation. No new orders at this time. Safety reinforced. Will continue to monitor closely.

## 2017-12-20 NOTE — Progress Notes (Signed)
Upon shift change pt guarding chest and moaning. Pt unable to communicate whether he is in pain or not. BP 151/61 HR 65 RR 20 SpO2 96. Tylenol given to pt. 2L oxygen applied. Oncoming RN at bedside and aware of pt's status.

## 2017-12-21 DIAGNOSIS — E785 Hyperlipidemia, unspecified: Secondary | ICD-10-CM | POA: Diagnosis not present

## 2017-12-21 DIAGNOSIS — E1100 Type 2 diabetes mellitus with hyperosmolarity without nonketotic hyperglycemic-hyperosmolar coma (NKHHC): Secondary | ICD-10-CM | POA: Diagnosis not present

## 2017-12-21 DIAGNOSIS — I10 Essential (primary) hypertension: Secondary | ICD-10-CM | POA: Diagnosis not present

## 2017-12-21 DIAGNOSIS — N183 Chronic kidney disease, stage 3 (moderate): Secondary | ICD-10-CM | POA: Diagnosis not present

## 2017-12-21 DIAGNOSIS — R0789 Other chest pain: Secondary | ICD-10-CM | POA: Diagnosis not present

## 2017-12-21 DIAGNOSIS — F0391 Unspecified dementia with behavioral disturbance: Secondary | ICD-10-CM | POA: Diagnosis not present

## 2017-12-21 LAB — BASIC METABOLIC PANEL
ANION GAP: 12 (ref 5–15)
BUN: 29 mg/dL — ABNORMAL HIGH (ref 6–20)
CALCIUM: 9.6 mg/dL (ref 8.9–10.3)
CO2: 26 mmol/L (ref 22–32)
Chloride: 106 mmol/L (ref 101–111)
Creatinine, Ser: 1.6 mg/dL — ABNORMAL HIGH (ref 0.61–1.24)
GFR, EST AFRICAN AMERICAN: 43 mL/min — AB (ref >60)
GFR, EST NON AFRICAN AMERICAN: 37 mL/min — AB (ref >60)
Glucose, Bld: 153 mg/dL — ABNORMAL HIGH (ref 65–99)
Potassium: 3.7 mmol/L (ref 3.5–5.1)
Sodium: 144 mmol/L (ref 135–145)

## 2017-12-21 LAB — GLUCOSE, CAPILLARY
GLUCOSE-CAPILLARY: 154 mg/dL — AB (ref 65–99)
GLUCOSE-CAPILLARY: 134 mg/dL — AB (ref 65–99)

## 2017-12-21 LAB — TROPONIN I: Troponin I: 0.05 ng/mL (ref <0.03)

## 2017-12-21 MED ORDER — NITROGLYCERIN 0.4 MG SL SUBL: 0.4000 mg | SUBLINGUAL_TABLET | SUBLINGUAL | 0 refills | Status: DC | PRN

## 2017-12-21 NOTE — Progress Notes (Addendum)
[facility] is [facility].  [facility] to [facility] to [facility].  LCSW faxed dc docs to facility. LCSW attempted to reach facility multiple times to confirm receipt of dc docs.   Spoke with facility to confirm return. Faxed dc docs again.   Patient will transport by [facility]. [facility] arranged at 4:06pm  LCSW notified family of transfer.   RN report #: [id number].  Beulah GandyBernette Alanta Scobey, LSCW [facility] CSW [phone]

## 2017-12-21 NOTE — Discharge Summary (Signed)
Physician Discharge Summary  Harold Jones ZOX:[id number] DOB: 22-Dec-1927 DOA: 12/19/2017  PCP: Mortimer Fries, PA  Admit date: 12/19/2017 Discharge date: 12/21/2017  Admitted From: Assisted living Disposition:  Assisted living  Recommendations for Outpatient Follow-up and new medication changes:  1. Follow up with Mortimer Fries PA in one week.  2. Added nitroglycerin as needed for chest pain. 3. Patient ruled out for acute coronary syndrome.   Home Health: na  Equipment/Devices: na   Discharge Condition: stable  CODE STATUS: full  Diet recommendation: Heart healthy and diabetic prudent.   Brief/Interim Summary: 84 year old male who presented with chest pain.  He does have the significant past medical history for coronary artery disease, dyslipidemia, dementia, type 2 diabetes mellitus and chronic kidney disease stage III (baseline GFR 35).  Patient had a language barrier of speaking Bosnian, per interpreter he had chest pain for about 24 hours, along with lower extremity pain.  Unable to get further details due to his dementia.  On the initial physical examination blood pressure 137/63, heart rate 50, respiratory rate 19, oxygen saturation 95%.  Mucous membranes were dry, his lungs were clear to auscultation bilaterally, heart S1-S2 present rhythmic, bradycardic, abdomen was soft nontender, no lower extremity edema.  Sodium 146, potassium 3.8, chloride 110, bicarb 26, glucose 129, BUN 33 creatinine 1.77, troponin 0.12, white count 5.5, hemoglobin 9.7, hematocrit 31.6, platelets 182.  His chest x-ray was negative for infiltrates.  Chest CT with a 4.2 cm stable ascending aortic aneurysm.  Stable pulmonary nodules in the right apical pleuroparenchymal scarring.  EKG is mainly paced rhythm, left axis deviation, left bundle branch block, lateral T wave inversions (chronic).    Patient was admitted to the hospital with the working diagnosis of atypical chest pain to rule out acute coronary  syndrome.   1.  Atypical chest pain which ruled out for acute coronary syndrome.  Patient was admitted to the medical ward, he was placed on a remote telemetry monitor, received analgesics with improvement of his symptoms.  His pain was reproducible upon chest wall palpation, peak troponin was 0.12 and at discharge down to 0.05.  Further work-up with echocardiography showed a preserved left systolic function with ejection fraction 65 to 70%, with no wall motion normalities.  The aortic valve had moderate stenosis and the peak PA pressure was 39.  His telemetry remained paced rhythm, with no further significant electrocardiographic changes.  Patient has dementia and it is difficult to assess his functional status.  He is not on anticoagulation due to history of GI bleed.  Will recommend to continue follow-up with cardiology clinic as an outpatient.  Nitroglycerin has been added as needed for chest pain.  It is likely that his troponin elevation it is not ischemia related, especially in the setting of significant reduction in glomerular filtration rate.  2.  Stage III chronic kidney disease.  Kidney function was closely monitored, furosemide was held, all other nephrotoxic agents were avoided as well as hypotension, she is being discharged with a stable creatinine at 1.6, potassium 3.7, and serum bicarbonate 26.  At discharge will resume furosemide.  Close follow-up as an outpatient of kidney function and electrolytes.  3.  Type 2 diabetes mellitus.  Patient was placed on insulin sliding scale for glucose coverage monitoring, Glucose remained well controlled, discharge he will resume his oral hypoglycemic agents with glipizide and metformin.   4.  Hypertension.  Continue blood pressure control with hydralazine.  5.  Chronic atrial fibrillation.  Patient remains on a  paced rhythm, he did have PACs and PVCs, not on anticoagulation due to history of GI bleeding.  6.  Coronary artery disease.  Patient rule  out for an acute coronary syndrome, continue aspirin, statin, and nitroglycerin.  It is difficult to assess his functional status.  Continue to follow-up with the outpatient cardiology clinic.  7.  Dementia.  Continue risperidone and citalopram.    Discharge Diagnoses:  Principal Problem:   Chest pain Active Problems:   Dyslipidemia   Essential hypertension   Diabetes mellitus, type 2 (HCC)   Chronic diastolic CHF (congestive heart failure) (HCC)   CKD (chronic kidney disease) stage 3, GFR 30-59 ml/min (HCC)   Dementia    Discharge Instructions   Allergies as of 12/21/2017   No Known Allergies     Medication List    TAKE these medications   acetaminophen 500 MG tablet Commonly known as:  TYLENOL Take 1,000 mg by mouth 3 (three) times daily.   aspirin 81 MG chewable tablet Chew 1 tablet (81 mg total) by mouth daily.   atorvastatin 10 MG tablet Commonly known as:  LIPITOR Take 10 mg by mouth daily.   citalopram 10 MG tablet Commonly known as:  CELEXA Take 10 mg by mouth daily.   docusate sodium 100 MG capsule Commonly known as:  COLACE Take 100 mg by mouth 2 (two) times daily.   furosemide 20 MG tablet Commonly known as:  LASIX Take 2 tablets (40 mg total) by mouth daily. Reported on 08/17/2015   gabapentin 300 MG capsule Commonly known as:  NEURONTIN Take 300 mg by mouth 2 (two) times daily.   glipiZIDE 5 MG tablet Commonly known as:  GLUCOTROL Take 5 mg by mouth 2 (two) times daily.   hydrALAZINE 25 MG tablet Commonly known as:  APRESOLINE Take 1 tablet (25 mg total) by mouth 2 (two) times daily. What changed:  when to take this   metFORMIN 1000 MG tablet Commonly known as:  GLUCOPHAGE Take 1,000 mg by mouth every evening.   nitroGLYCERIN 0.4 MG SL tablet Commonly known as:  NITROSTAT Place 1 tablet (0.4 mg total) under the tongue every 5 (five) minutes as needed for chest pain (if more than 3 needed, please call your doctor.).   oxyCODONE 5 MG  immediate release tablet Commonly known as:  Oxy IR/ROXICODONE Take 5 mg by mouth 2 (two) times daily.   pantoprazole 40 MG tablet Commonly known as:  PROTONIX Take 1 tablet (40 mg total) by mouth daily.   polyethylene glycol packet Commonly known as:  MIRALAX / GLYCOLAX Take 17 g by mouth daily. Mix with 8 ounces of liquid and drink/Hold for loose stools   risperiDONE 0.5 MG tablet Commonly known as:  RISPERDAL Take 0.5 mg by mouth daily.   tamsulosin 0.4 MG Caps capsule Commonly known as:  FLOMAX Take 0.4 mg by mouth daily.       No Known Allergies  Consultations:   Procedures/Studies: Dg Chest 2 View  Result Date: 12/19/2017 CLINICAL DATA:  Chest pain EXAM: CHEST - 2 VIEW COMPARISON:  10/02/2017 FINDINGS: Cardiac enlargement. Aortic valve replacement. Single lead pacemaker unchanged. Mild vascular congestion. Negative for edema or effusion. Negative for pneumonia IMPRESSION: Mild vascular congestion without edema or infiltrate. Electronically Signed   By: Marlan Palau M.D.   On: 12/19/2017 12:55   Ct Head Wo Contrast  Result Date: 12/19/2017 CLINICAL DATA:  Headaches EXAM: CT HEAD WITHOUT CONTRAST TECHNIQUE: Contiguous axial images were obtained from the base  of the skull through the vertex without intravenous contrast. COMPARISON:  01/16/2016 FINDINGS: Brain: Diffuse atrophic changes are noted. Old left cerebellar infarct is noted. Chronic white matter ischemic changes are again seen similar to that noted on the prior exam. No findings to suggest acute hemorrhage, acute infarction or space-occupying mass lesion are noted. Vascular: No hyperdense vessel or unexpected calcification. Skull: Normal. Negative for fracture or focal lesion. Sinuses/Orbits: No acute finding. Other: None. IMPRESSION: Chronic atrophic and ischemic changes stable from the prior exam. Electronically Signed   By: Alcide Clever M.D.   On: 12/19/2017 12:26   Ct Chest W Contrast  Result Date:  12/19/2017 CLINICAL DATA:  Chest pain. Hemoptysis. History of dementia, coronary artery disease, ascending aortic aneurysm. EXAM: CT CHEST WITH CONTRAST TECHNIQUE: Multidetector CT imaging of the chest was performed during intravenous contrast administration. CONTRAST:  60mL OMNIPAQUE IOHEXOL 300 MG/ML  SOLN COMPARISON:  CT chest March 21, 2017 in chest radiograph December 19, 2017 FINDINGS: CARDIOVASCULAR: Stable 4.2 cm ascending aorta with irregular intimal thickening calcific atherosclerosis. Descending aorta is normal in course and caliber. Heart is mildly enlarged. No pericardial effusion. Mild coronary artery calcification. Status post aortic valve replacement. MEDIASTINUM/NODES: No mediastinal mass. No lymphadenopathy by CT size criteria. Normal appearance of thoracic esophagus though not tailored for evaluation. LUNGS/PLEURA: Tracheobronchial tree is patent, no pneumothorax. Trace bronchial wall thickening. Stable 5 mm RIGHT middle lobe pulmonary nodules (series 6, image 100 and 8 and 83). Scattered calcified granulomas. Stable spiculated RIGHT apical pleural thickening measuring 18 x 15 mm with stable 8 mm nodular scarring (series 6, image 41). UPPER ABDOMEN: Nonacute. Partially imaged bilateral renal cysts measuring to 2.4 cm on the RIGHT. Status post cholecystectomy. Stable small gas filled duodenal diverticulum at the level the pancreatic head. MUSCULOSKELETAL: Nonacute. Status post median sternotomy. Mild degenerative change of the spine. IMPRESSION: 1. Stable 4.2 cm ascending aortic aneurysm. Recommend annual imaging followup by CTA or MRA. This recommendation follows 2010 ACCF/AHA/AATS/ACR/ASA/SCA/SCAI/SIR/STS/SVM Guidelines for the Diagnosis and Management of Patients with Thoracic Aortic Disease. Circulation. 2010; 121: R604-V409. 2. Mild bronchial wall thickening seen with bronchitis or reactive airway disease. No pneumonia. 3. Stable pulmonary nodules and RIGHT apical pleuroparenchymal scarring.  4. Stable cardiomegaly.  Status post AVR. Aortic Atherosclerosis (ICD10-I70.0). Electronically Signed   By: Awilda Metro M.D.   On: 12/19/2017 20:57       Subjective: Patient is feeling well, not apparent dyspnea or chest pain.   Discharge Exam: Vitals:   12/21/17 0615 12/21/17 0910  BP: 136/71 136/71  Pulse: (!) 49   Resp:    Temp:    SpO2:     Vitals:   12/20/17 2158 12/21/17 0431 12/21/17 0615 12/21/17 0910  BP: (!) 160/81 (!) 190/68 136/71 136/71  Pulse: (!) 43 (!) 57 (!) 49   Resp: 17 18    Temp: 97.7 F (36.5 C) 97.6 F (36.4 C)    TempSrc: Oral Oral    SpO2: 98% 99%    Weight:      Height:        General: Not in pain or dyspnea.  Neurology: Awake and alert, non focal  E ENT: no pallor, no icterus, oral mucosa moist Cardiovascular: No JVD. S1-S2 present, rhythmic, no gallops, rubs, or murmurs. No lower extremity edema. Pulmonary: vesicular breath sounds bilaterally, adequate air movement, no wheezing, rhonchi or rales. Gastrointestinal. Abdomen with no organomegaly, non tender, no rebound or guarding Skin. No rashes Musculoskeletal: no joint deformities/ mild pain on chest palpation  on the left.    The results of significant diagnostics from this hospitalization (including imaging, microbiology, ancillary and laboratory) are listed below for reference.     Microbiology: Recent Results (from the past 240 hour(s))  MRSA PCR Screening     Status: None   Collection Time: 12/19/17  5:08 PM  Result Value Ref Range Status   MRSA by PCR NEGATIVE NEGATIVE Final    Comment:        The GeneXpert MRSA Assay (FDA approved for NASAL specimens only), is one component of a comprehensive MRSA colonization surveillance program. It is not intended to diagnose MRSA infection nor to guide or monitor treatment for MRSA infections. Performed at [facility], [address], [place], [place] [address]      Labs: BNP (last 3 results) Recent Labs     04/20/17 1514  BNP 285.8*   Basic Metabolic Panel: Recent Labs  Lab 12/19/17 1248 12/20/17 0457 12/21/17 0432  NA 146* 145 144  K 3.8 3.7 3.7  CL 110 106 106  CO2 26 28 26   GLUCOSE 129* 129* 153*  BUN 33* 33* 29*  CREATININE 1.77* 1.87* 1.60*  CALCIUM 9.6 9.7 9.6   Liver Function Tests: Recent Labs  Lab 12/19/17 1248  AST 21  ALT 14*  ALKPHOS 37*  BILITOT 0.9  PROT 7.3  ALBUMIN 3.9   No results for input(s): LIPASE, AMYLASE in the last 168 hours. No results for input(s): AMMONIA in the last 168 hours. CBC: Recent Labs  Lab 12/19/17 1248 12/20/17 0457  WBC 5.5 5.2  NEUTROABS 4.0  --   HGB 9.7* 9.9*  HCT 31.6* 32.4*  MCV 79.4 78.8  PLT 182 193   Cardiac Enzymes: Recent Labs  Lab 12/19/17 1248 12/19/17 1716 12/19/17 2248 12/20/17 0457 12/21/17 0432  CKTOTAL 106  --   --  158  --   TROPONINI  --  0.12* 0.09* 0.07* 0.05*   BNP: Invalid input(s): POCBNP CBG: Recent Labs  Lab 12/20/17 0724 12/20/17 1203 12/20/17 1754 12/20/17 2158 12/21/17 0828  GLUCAP 148* 152* 119* 154* 134*   D-Dimer No results for input(s): DDIMER in the last 72 hours. Hgb A1c No results for input(s): HGBA1C in the last 72 hours. Lipid Profile No results for input(s): CHOL, HDL, LDLCALC, TRIG, CHOLHDL, LDLDIRECT in the last 72 hours. Thyroid function studies No results for input(s): TSH, T4TOTAL, T3FREE, THYROIDAB in the last 72 hours.  Invalid input(s): FREET3 Anemia work up No results for input(s): VITAMINB12, FOLATE, FERRITIN, TIBC, IRON, RETICCTPCT in the last 72 hours. Urinalysis    Component Value Date/Time   COLORURINE STRAW (A) 04/20/2017 1702   APPEARANCEUR CLEAR 04/20/2017 1702   LABSPEC 1.008 04/20/2017 1702   PHURINE 5.0 04/20/2017 1702   GLUCOSEU NEGATIVE 04/20/2017 1702   HGBUR NEGATIVE 04/20/2017 1702   BILIRUBINUR NEGATIVE 04/20/2017 1702   KETONESUR NEGATIVE 04/20/2017 1702   PROTEINUR NEGATIVE 04/20/2017 1702   UROBILINOGEN 0.2 03/01/2015  1940   NITRITE NEGATIVE 04/20/2017 1702   LEUKOCYTESUR NEGATIVE 04/20/2017 1702   Sepsis Labs Invalid input(s): PROCALCITONIN,  WBC,  LACTICIDVEN Microbiology Recent Results (from the past 240 hour(s))  MRSA PCR Screening     Status: None   Collection Time: 12/19/17  5:08 PM  Result Value Ref Range Status   MRSA by PCR NEGATIVE NEGATIVE Final    Comment:        The GeneXpert MRSA Assay (FDA approved for NASAL specimens only), is one component of a comprehensive  MRSA colonization surveillance program. It is not intended to diagnose MRSA infection nor to guide or monitor treatment for MRSA infections. Performed at [facility], [address] [place], [place] [address]      Time coordinating discharge: 45 minutes  SIGNED:   Coralie KeensMauricio Daniel Kayvon Mo, MD  [facility] 12/21/2017, 11:53 AM Pager [phone]  If 7PM-7AM, please contact night-coverage [email or web address] Password [email or web address]

## 2017-12-21 NOTE — Progress Notes (Signed)
[facility] SW to arrange transport via [facility]. Melton Alarana A Javarion Douty, RN

## 2017-12-21 NOTE — Evaluation (Signed)
Physical Therapy Evaluation Patient Details Name: Jamieon Lannen MRN: [id number] DOB: September 10, 1927 Today's Date: 12/21/2017   History of Present Illness  84 yo male admitted with chest pain. Hx of dementia, A fib, pacemaker, DM, CVA. Pt is Bosnian. Per chart, he is from an ALF  Clinical Impression  On eval, pt required Min assist for mobility. He walked ~75 feet with a RW. Pt continues to c/o chest pain. No family present during session. Recommend HH PT at ALF as long as staff can provide current level of care. If not, then pt may need SNF. Will follow during hospital stay.     Follow Up Recommendations Home health PT;Supervision/Assistance - 24 hour(at ALF as long as facility can provide current level of care)    Equipment Recommendations  None recommended by PT    Recommendations for Other Services       Precautions / Restrictions Precautions Precautions: Fall Restrictions Weight Bearing Restrictions: No      Mobility  Bed Mobility Overal bed mobility: Needs Assistance Bed Mobility: Supine to Sit     Supine to sit: Min assist     General bed mobility comments: Assist for trunk. Increased time.   Transfers Overall transfer level: Needs assistance Equipment used: Rolling walker (2 wheeled) Transfers: Sit to/from Stand Sit to Stand: Min assist;From elevated surface         General transfer comment: Assist to rise, stabilize, control descent. VCs safety, technique. Pt pulled up on walker.   Ambulation/Gait Ambulation/Gait assistance: Min assist Gait Distance (Feet): 75 Feet Assistive device: Rolling walker (2 wheeled) Gait Pattern/deviations: Trunk flexed     General Gait Details: Assist to stabilize pt and maneuver with walker. Pt tends to keep walker too far ahead.   Stairs            Wheelchair Mobility    Modified Rankin (Stroke Patients Only)       Balance Overall balance assessment: Needs assistance         Standing balance support:  Bilateral upper extremity supported Standing balance-Leahy Scale: Poor Standing balance comment: requires UE support                             Pertinent Vitals/Pain Pain Assessment: Faces Faces Pain Scale: Hurts little more Pain Location: chest Pain Intervention(s): Monitored during session    Home Living Family/patient expects to be discharged to:: Assisted living                 Additional Comments: per chart, [facility]    Prior Function           Comments: unsure of PLOF     Hand Dominance        Extremity/Trunk Assessment   Upper Extremity Assessment Upper Extremity Assessment: Generalized weakness    Lower Extremity Assessment Lower Extremity Assessment: Generalized weakness    Cervical / Trunk Assessment Cervical / Trunk Assessment: Kyphotic  Communication   Communication: Interpreter utilized;Prefers language other than English  Cognition Arousal/Alertness: Awake/alert Behavior During Therapy: WFL for tasks assessed/performed Overall Cognitive Status: History of cognitive impairments - at baseline                                 General Comments: difficult due to [facility] and dementia. Utilized interpreter as best I could      General Comments  Exercises     Assessment/Plan    PT Assessment Patient needs continued PT services  PT Problem List Decreased balance;Decreased strength;Decreased cognition;Decreased mobility;Decreased activity tolerance;Decreased knowledge of use of DME       PT Treatment Interventions DME instruction;Gait training;Functional mobility training;Balance training;Patient/family education;Therapeutic activities;Therapeutic exercise    PT Goals (Current goals can be found in the Care Plan section)  Acute Rehab PT Goals Patient Stated Goal: none stated PT Goal Formulation: Patient unable to participate in goal setting Time For Goal Achievement: 01/04/18 Potential to  Achieve Goals: Fair    Frequency Min 3X/week   Barriers to discharge        Co-evaluation               AM-PAC PT "6 Clicks" Daily Activity  Outcome Measure Difficulty turning over in bed (including adjusting bedclothes, sheets and blankets)?: A Lot Difficulty moving from lying on back to sitting on the side of the bed? : Unable Difficulty sitting down on and standing up from a chair with arms (e.g., wheelchair, bedside commode, etc,.)?: Unable Help needed moving to and from a bed to chair (including a wheelchair)?: A Little Help needed walking in hospital room?: A Little Help needed climbing 3-5 steps with a railing? : A Lot 6 Click Score: 12    End of Session Equipment Utilized During Treatment: Gait belt Activity Tolerance: Patient tolerated treatment well Patient left: in bed;with call bell/phone within reach;with bed alarm set   PT Visit Diagnosis: Muscle weakness (generalized) (M62.81);Difficulty in walking, not elsewhere classified (R26.2)    Time: [id number] PT Time Calculation (min) (ACUTE ONLY): 29 min   Charges:   PT Evaluation $PT Eval Moderate Complexity: 1 Mod PT Treatments $Gait Training: 8-22 mins   PT G Codes:          Rebeca AlertJannie Joclynn Lumb, MPT Pager: [phone]

## 2017-12-21 NOTE — Progress Notes (Signed)
LCSW following for facility placement.   [facility]. LCSW attempting to reach facility and family  to confirm return and baseline.   LCSW will continue to follow for dc needs.   Harold Jones, LSCW [facility] CSW [phone]

## 2017-12-21 NOTE — Clinical Social Work Note (Signed)
Clinical Social Work Assessment  Patient Details  Name: Harold Jones MRN: [id number] Date of Birth: 03/09/1928  Date of referral:  12/21/17               Reason for consult:  Facility Placement                Permission sought to share information with:  Family Supports Permission granted to share information::  Yes, Verbal Permission Granted  Name::     Ammie FerrierLjerka  [facility]  Relationship::  LawyerGrandaughter  Contact Information:     Housing/Transportation Living arrangements for the past 2 months:  [facility] Source of Information:  Other (Comment Required)(Granddaughter) Patient Interpreter Needed:  None Criminal Activity/Legal Involvement Pertinent to Current Situation/Hospitalization:  No - Comment as needed Significant Relationships:  Adult Children, Community Support Lives with:  Facility Resident Do you feel safe going back to the place where you live?  Yes Need for family participation in patient care:  Yes (Comment)  Care giving concerns:  No care giving concerns at the time of assessment.    Social Worker assessment / plan:  LCSW following for facility placement.   [facility].  Patient admitted for chest pain.   LCSW spoke with patient's granddaughter, Ammie FerrierLjerka. According to Ammie FerrierLjerka, patient has [facility] a [facility]. At baseline patient uses a cane to ambulate.   PLAN: Patient and family agreeable to return to [facility].   Employment status:  Retired Health and safety inspectornsurance information:  Medicare PT Recommendations:    Information / Referral to community resources:     Patient/Family's Response to care:  Patient and family thankful for [employer] services.   Patient/Family's Understanding of and Emotional Response to Diagnosis, Current Treatment, and Prognosis:  Patient and family realistic about patient treatment plan and agreeable to return to facility. Ammie FerrierLjerka asked questions regarding PT/OT for  patient. LCSW answered questions.   Emotional Assessment Appearance:  Appears stated age Attitude/Demeanor/Rapport:    Affect (typically observed):    Orientation:  Oriented to Self, Oriented to Place Alcohol / Substance use:  Not Applicable Psych involvement (Current and /or in the community):  No (Comment)  Discharge Needs  Concerns to be addressed:  No discharge needs identified Readmission within the last 30 days:  No Current discharge risk:  None Barriers to Discharge:  No Barriers Identified   Coralyn HellingBernette Lorice Lafave, LCSW 12/21/2017, 2:18 PM

## 2017-12-21 NOTE — Progress Notes (Cosign Needed)
Patient is very pleasant, and is relaxing in bed. Patient enjoyed breakfast and ate without any issues.  Patient unable to communicate exact feelings due to language barrier but showed he was feeling some chest pain on the left. Will continue to monitor.

## 2017-12-21 NOTE — [place] FL2 (Signed)
[place] MEDICAID FL2 LEVEL OF CARE SCREENING TOOL     IDENTIFICATION  Patient Name: Harold Jones Birthdate: 01/22/1928 Sex: male Admission Date (Current Location): 12/19/2017  [facility] [place]   Facility and Address:  [facility],  [address], [place] [id number]      Provider Number: [id number]  Attending Physician Name and Address:  Coralie KeensArrien, Mauricio Daniel,*  Relative Name and Phone Number:       Current Level of Care: Hospital Recommended Level of Care: Assisted Living Facility Prior Approval Number:    Date Approved/Denied:   PASRR Number: [id number]  Discharge Plan: Other (Comment)(ALF)    Current Diagnoses: Patient Active Problem List   Diagnosis Date Noted  . Chest pain 12/19/2017  . Chronic diastolic CHF (congestive heart failure) (HCC) 12/19/2017  . CKD (chronic kidney disease) stage 3, GFR 30-59 ml/min (HCC) 12/19/2017  . Dementia 12/19/2017  . CHF exacerbation (HCC) 02/27/2016  . Altered mental state   . Confusion   . Acute GI bleeding   . GI bleed 01/12/2016  . GIB (gastrointestinal bleeding) 01/12/2016  . Symptomatic bradycardia 08/15/2015  . Intraventricular conduction delay 08/15/2015  . S/P aortic valve replacement with bioprosthetic valve 08/15/2015  . Atrial fibrillation with slow ventricular response (HCC) 08/15/2015  . Severe sepsis (HCC) 05/30/2015  . Acute respiratory failure (HCC) 05/30/2015  . History of stroke 03/15/2015  . Benign hypertensive heart disease without heart failure 03/08/2015  . 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 BPH without obstruction/lower urinary tract symptoms 03/08/2015  . Post herpetic neuralgia 03/08/2015  . CAP (community acquired pneumonia) 03/01/2015  . Syncope and collapse 11/12/2013  . Nasal fracture 11/12/2013  . Encephalopathy acute 06/23/2013  . Diabetes mellitus, type 2 (HCC) 06/23/2013  . Hypoglycemia 06/23/2013   . Supratherapeutic INR 06/23/2013  . Zoster 10/12/2011  . Altered mental status 10/08/2011  . GLAUCOMA 09/08/2010  . KNEE PAIN, LEFT 11/22/2009  . 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 CAROTID BRUIT, RIGHT 01/18/2009  . GASTROINTESTINAL HEMORRHAGE 09/26/2007  . OSTEOARTHRITIS 03/18/2007  . Dyslipidemia 02/27/2007  . Coronary atherosclerosis 02/27/2007  . Essential hypertension 02/25/2007  . Cerebral artery occlusion with cerebral infarction (HCC) 02/28/2004  . ASCENDING AORTIC ANEURYSM 02/28/2004  . H/O aortic valve replacement 02/28/2004  . FIBRILLATION, ATRIAL 08/31/2002    Orientation RESPIRATION BLADDER Height & Weight     Self, Place  Normal Continent, External catheter Weight: 179 lb 14.3 oz (81.6 kg) Height:  6\' 1"  (185.4 cm)  BEHAVIORAL SYMPTOMS/MOOD NEUROLOGICAL BOWEL NUTRITION STATUS      Continent Diet(Heart Healthy, Diabetic Prudent)  AMBULATORY STATUS COMMUNICATION OF NEEDS Skin   Limited Assist Verbally Normal                       Personal Care Assistance Level of Assistance  Bathing, Feeding, Dressing Bathing Assistance: Limited assistance Feeding assistance: Independent Dressing Assistance: Limited assistance     Functional Limitations Info  Sight, Speech, Hearing Sight Info: Adequate Hearing Info: Impaired Speech Info: Adequate    SPECIAL CARE FACTORS FREQUENCY                       Contractures Contractures Info: Not present    Additional Factors Info  Code Status, Allergies Code Status Info: Full Allergies Info: NKA           Current Medications (12/21/2017):  This is the current  hospital active medication list Current Facility-Administered Medications  Medication Dose Route Frequency Provider Last Rate Last Dose  . acetaminophen (TYLENOL) tablet 650 mg  650 mg Oral Q6H PRN Noralee Stain, DO   650 mg at 12/20/17 1610   Or  . acetaminophen (TYLENOL) suppository 650 mg  650 mg Rectal Q6H  PRN Noralee Stain, DO      . aspirin chewable tablet 81 mg  81 mg Oral Daily Noralee Stain, DO   81 mg at 12/21/17 0914  . atorvastatin (LIPITOR) tablet 10 mg  10 mg Oral Daily Noralee Stain, DO   10 mg at 12/21/17 0910  . citalopram (CELEXA) tablet 10 mg  10 mg Oral Daily Noralee Stain, DO   10 mg at 12/21/17 0911  . gabapentin (NEURONTIN) capsule 300 mg  300 mg Oral BID Noralee Stain, DO   300 mg at 12/21/17 0913  . hydrALAZINE (APRESOLINE) tablet 25 mg  25 mg Oral TID Noralee Stain, DO   25 mg at 12/21/17 0911  . insulin aspart (novoLOG) injection 0-9 Units  0-9 Units Subcutaneous TID WC Noralee Stain, DO   1 Units at 12/21/17 [phone]  . morphine 2 MG/ML injection 1 mg  1 mg Intravenous Q4H PRN Arrien, York Ram, MD   1 mg at 12/20/17 1103  . nitroGLYCERIN (NITROSTAT) SL tablet 0.4 mg  0.4 mg Sublingual Q5 min PRN Arrien, York Ram, MD      . ondansetron [facility]) tablet 4 mg  4 mg Oral Q6H PRN Noralee Stain, DO       Or  . ondansetron [facility]) injection 4 mg  4 mg Intravenous Q6H PRN Noralee Stain, DO      . oxyCODONE (Oxy IR/ROXICODONE) immediate release tablet 5 mg  5 mg Oral BID Arrien, York Ram, MD   5 mg at 12/21/17 0910  . pantoprazole (PROTONIX) EC tablet 40 mg  40 mg Oral Daily Noralee Stain, DO   40 mg at 12/21/17 0914  . polyethylene glycol (MIRALAX / GLYCOLAX) packet 17 g  17 g Oral Daily Noralee Stain, DO   17 g at 12/21/17 0916  . risperiDONE (RISPERDAL) tablet 0.5 mg  0.5 mg Oral Daily Noralee Stain, DO   0.5 mg at 12/21/17 0910  . tamsulosin (FLOMAX) capsule 0.4 mg  0.4 mg Oral Daily Noralee Stain, DO   0.4 mg at 12/21/17 5409     Discharge Medications: Please see discharge summary for a list of discharge medications.  Relevant Imaging Results:  Relevant Lab Results:   Additional Information SS#: [id number]  Coralyn Helling, LCSW

## 2017-12-23 LAB — GLUCOSE, CAPILLARY: Glucose-Capillary: 165 mg/dL — ABNORMAL HIGH (ref 65–99)

## 2017-12-29 ENCOUNTER — Encounter (HOSPITAL_COMMUNITY): Payer: Self-pay | Admitting: Emergency Medicine

## 2017-12-29 ENCOUNTER — Emergency Department (HOSPITAL_COMMUNITY)
Admission: EM | Admit: 2017-12-29 | Discharge: 2017-12-30 | Disposition: A | Discharge status: Home / Self Care | Payer: Medicare Other | Attending: Emergency Medicine | Admitting: Emergency Medicine

## 2017-12-29 ENCOUNTER — Emergency Department (HOSPITAL_COMMUNITY): Payer: Medicare Other

## 2017-12-29 DIAGNOSIS — M7918 Myalgia, other site: Secondary | ICD-10-CM | POA: Insufficient documentation

## 2017-12-29 DIAGNOSIS — I130 Hypertensive heart and chronic kidney disease with heart failure and stage 1 through stage 4 chronic kidney disease, or unspecified chronic kidney disease: Secondary | ICD-10-CM | POA: Insufficient documentation

## 2017-12-29 DIAGNOSIS — R079 Chest pain, unspecified: Secondary | ICD-10-CM

## 2017-12-29 DIAGNOSIS — I5032 Chronic diastolic (congestive) heart failure: Secondary | ICD-10-CM | POA: Diagnosis not present

## 2017-12-29 DIAGNOSIS — E1149 Type 2 diabetes mellitus with other diabetic neurological complication: Secondary | ICD-10-CM | POA: Diagnosis not present

## 2017-12-29 DIAGNOSIS — R0789 Other chest pain: Secondary | ICD-10-CM | POA: Insufficient documentation

## 2017-12-29 DIAGNOSIS — M791 Myalgia, unspecified site: Secondary | ICD-10-CM

## 2017-12-29 DIAGNOSIS — Z7984 Long term (current) use of oral hypoglycemic drugs: Secondary | ICD-10-CM | POA: Diagnosis not present

## 2017-12-29 DIAGNOSIS — Z79899 Other long term (current) drug therapy: Secondary | ICD-10-CM | POA: Diagnosis not present

## 2017-12-29 DIAGNOSIS — Z7982 Long term (current) use of aspirin: Secondary | ICD-10-CM | POA: Diagnosis not present

## 2017-12-29 DIAGNOSIS — R10819 Abdominal tenderness, unspecified site: Secondary | ICD-10-CM | POA: Diagnosis not present

## 2017-12-29 DIAGNOSIS — N183 Chronic kidney disease, stage 3 (moderate): Secondary | ICD-10-CM | POA: Insufficient documentation

## 2017-12-29 LAB — BASIC METABOLIC PANEL
Anion gap: 14 (ref 5–15)
BUN: 38 mg/dL — AB (ref 8–23)
CALCIUM: 9.2 mg/dL (ref 8.9–10.3)
CO2: 22 mmol/L (ref 22–32)
Chloride: 107 mmol/L (ref 98–111)
Creatinine, Ser: 2.1 mg/dL — ABNORMAL HIGH (ref 0.61–1.24)
GFR calc Af Amer: 31 mL/min — ABNORMAL LOW (ref >60)
GFR calc non Af Amer: 26 mL/min — ABNORMAL LOW (ref >60)
GLUCOSE: 76 mg/dL (ref 70–99)
Potassium: 3.4 mmol/L — ABNORMAL LOW (ref 3.5–5.1)
SODIUM: 143 mmol/L (ref 135–145)

## 2017-12-29 LAB — CBC
HCT: 35.5 % — ABNORMAL LOW (ref 39.0–52.0)
Hemoglobin: 10.4 g/dL — ABNORMAL LOW (ref 13.0–17.0)
MCH: 23.9 pg — ABNORMAL LOW (ref 26.0–34.0)
MCHC: 29.3 g/dL — AB (ref 30.0–36.0)
MCV: 81.4 fL (ref 78.0–100.0)
PLATELETS: 183 10*3/uL (ref 150–400)
RBC: 4.36 MIL/uL (ref 4.22–5.81)
RDW: 17.3 % — AB (ref 11.5–15.5)
WBC: 5.6 10*3/uL (ref 4.0–10.5)

## 2017-12-29 LAB — I-STAT TROPONIN, ED: TROPONIN I, POC: 0.03 ng/mL (ref 0.00–0.08)

## 2017-12-29 LAB — TROPONIN I: Troponin I: 0.03 ng/mL (ref <0.03)

## 2017-12-29 MED ORDER — MORPHINE SULFATE (PF) 4 MG/ML IV SOLN
2.0000 mg | Freq: Once | INTRAVENOUS | Status: AC
  Administered 2017-12-29: 2 mg via INTRAVENOUS
  Filled 2017-12-29: qty 1

## 2017-12-29 NOTE — ED Notes (Signed)
Patient transported to X-ray 

## 2017-12-29 NOTE — ED Triage Notes (Signed)
Pt arrives via EMS with complaints of chest pain. Pt speaks Bosnian and no one was able to translate. Pt moaning and holding chest.

## 2017-12-29 NOTE — ED Notes (Signed)
PT given water per Clydie BraunKaren, [place]

## 2017-12-29 NOTE — ED Notes (Signed)
Report from [place] Springs[place]aggie, [place].  [facility]

## 2017-12-29 NOTE — ED Provider Notes (Signed)
[facility] Provider Note   CSN: [id number] Arrival date & time: 12/29/17  1549     History   Chief Complaint Chief Complaint  Patient presents with  . Chest Pain    HPI Harold Jones is a 84 y.o. male.  The history is provided by the EMS personnel, the nursing home and a relative. The history is limited by a language barrier.  Chest Pain   This is a new problem. The current episode started yesterday. The problem occurs constantly. The problem has been gradually worsening. The pain is severe. The pain does not radiate.  Nursing home called EMS because pt is not eating and is weak.  Pt has been walking to meals but had increasing weakness.  They report pt was clutching his chest tonight.  Pt has been drinking water more frequently.  He has not been eating.  Language line used but pt can not/will not speak with interpreter.  I suspect pt can not hear well.  Past Medical History:  Diagnosis Date  . Aortic valve disorder   . BPH without obstruction/lower urinary tract symptoms   . Chest wall pain following surgery   . COGNITIVE DEFICITS DUE CEREBROVASCULAR DISEASE 09/09/2009   Qualifier: Diagnosis of  By: Delrae AlfredMulberry MD, Lanora ManisElizabeth    . CORONARY ARTERY DISEASE 02/27/2007   Qualifier: Diagnosis of  By: Barbaraann Barthelankins MD, [place]    . CVA 02/28/2004   Annotation: embolic Qualifier: Diagnosis of  By: Barbaraann Barthelankins MD, [place]    . Dementia    [facility]  . Diabetes mellitus   . DYSLIPIDEMIA 02/27/2007   Qualifier: Diagnosis of  By: Barbaraann Barthelankins MD, [place]    . FIBRILLATION, ATRIAL 08/31/2002   Qualifier: Diagnosis of  By: Barbaraann Barthelankins MD, [place]    . Grief   . Hypertension   . Neuropathic pain   . Osteoarthritis of left knee   . Physical deconditioning   . Symptomatic bradycardia 08/16/15   [employer] PPM, Dr. Ladona Ridgelaylor    Patient Active Problem List   Diagnosis Date Noted  . Chest pain 12/19/2017  . Chronic diastolic CHF  (congestive heart failure) (HCC) 12/19/2017  . CKD (chronic kidney disease) stage 3, GFR 30-59 ml/min (HCC) 12/19/2017  . Dementia 12/19/2017  . CHF exacerbation (HCC) 02/27/2016  . Altered mental state   . Confusion   . Acute GI bleeding   . GI bleed 01/12/2016  . GIB (gastrointestinal bleeding) 01/12/2016  . Symptomatic bradycardia 08/15/2015  . Intraventricular conduction delay 08/15/2015  . S/P aortic valve replacement with bioprosthetic valve 08/15/2015  . Atrial fibrillation with slow ventricular response (HCC) 08/15/2015  . Severe sepsis (HCC) 05/30/2015  . Acute respiratory failure (HCC) 05/30/2015  . History of stroke 03/15/2015  . Benign hypertensive heart disease without heart failure 03/08/2015  . 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 BPH without obstruction/lower urinary tract symptoms 03/08/2015  . Post herpetic neuralgia 03/08/2015  . CAP (community acquired pneumonia) 03/01/2015  . Syncope and collapse 11/12/2013  . Nasal fracture 11/12/2013  . Encephalopathy acute 06/23/2013  . Diabetes mellitus, type 2 (HCC) 06/23/2013  . Hypoglycemia 06/23/2013  . Supratherapeutic INR 06/23/2013  . Zoster 10/12/2011  . Altered mental status 10/08/2011  . GLAUCOMA 09/08/2010  . KNEE PAIN, LEFT 11/22/2009  . 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 CAROTID BRUIT, RIGHT 01/18/2009  . GASTROINTESTINAL HEMORRHAGE 09/26/2007  . OSTEOARTHRITIS 03/18/2007  . Dyslipidemia 02/27/2007  .  Coronary atherosclerosis 02/27/2007  . Essential hypertension 02/25/2007  . Cerebral artery occlusion with cerebral infarction (HCC) 02/28/2004  . ASCENDING AORTIC ANEURYSM 02/28/2004  . H/O aortic valve replacement 02/28/2004  . FIBRILLATION, ATRIAL 08/31/2002    Past Surgical History:  Procedure Laterality Date  . CARDIAC SURGERY     bioprosthetic AV replacement  . EP IMPLANTABLE DEVICE N/A 08/16/2015   [facility]  [employer] PPM, Dr. Ladona Ridgel  . ESOPHAGOGASTRODUODENOSCOPY (EGD) WITH PROPOFOL N/A 01/17/2016   Procedure: ESOPHAGOGASTRODUODENOSCOPY (EGD) WITH PROPOFOL;  Surgeon: Bernette Redbird, MD;  Location[facility] ENDOSCOPY;  Service: Endoscopy;  Laterality: N/A;        Home Medications    Prior to Admission medications   Medication Sig Start Date End Date Taking? Authorizing Provider  acetaminophen (TYLENOL) 500 MG tablet Take 1,000 mg by mouth 3 (three) times daily.    [provider]  aspirin 81 MG chewable tablet Chew 1 tablet (81 mg total) by mouth daily. 02/29/16   Ghimire, Werner Lean, MD  atorvastatin (LIPITOR) 10 MG tablet Take 10 mg by mouth daily.    [provider]  citalopram (CELEXA) 10 MG tablet Take 10 mg by mouth daily.    [provider]  docusate sodium (COLACE) 100 MG capsule Take 100 mg by mouth 2 (two) times daily.    [provider]  furosemide (LASIX) 20 MG tablet Take 2 tablets (40 mg total) by mouth daily. Reported on 08/17/2015 03/01/16   Maretta Bees, MD  gabapentin (NEURONTIN) 300 MG capsule Take 300 mg by mouth 2 (two) times daily.    [provider]  glipiZIDE (GLUCOTROL) 5 MG tablet Take 5 mg by mouth 2 (two) times daily.  11/14/13   Esperanza Sheets, MD  hydrALAZINE (APRESOLINE) 25 MG tablet Take 1 tablet (25 mg total) by mouth 2 (two) times daily. Patient taking differently: Take 25 mg by mouth 3 (three) times daily.  02/29/16   Ghimire, Werner Lean, MD  metFORMIN (GLUCOPHAGE) 1000 MG tablet Take 1,000 mg by mouth every evening.     [provider]  nitroGLYCERIN (NITROSTAT) 0.4 MG SL tablet Place 1 tablet (0.4 mg total) under the tongue every 5 (five) minutes as needed for chest pain (if more than 3 needed, please call your doctor.). 12/21/17   Arrien, York Ram, MD  oxyCODONE (OXY IR/ROXICODONE) 5 MG immediate release tablet Take 5 mg by mouth 2 (two) times daily.    [provider]  pantoprazole (PROTONIX)  40 MG tablet Take 1 tablet (40 mg total) by mouth daily. 01/19/16   Richarda Overlie, MD  polyethylene glycol (MIRALAX / GLYCOLAX) packet Take 17 g by mouth daily. Mix with 8 ounces of liquid and drink/Hold for loose stools    [provider]  risperiDONE (RISPERDAL) 0.5 MG tablet Take 0.5 mg by mouth daily.    [provider]  tamsulosin (FLOMAX) 0.4 MG CAPS capsule Take 0.4 mg by mouth daily.  02/12/15   [provider]    Family History Family History  Problem Relation Age of Onset  . Other Mother        old age  . CAD Father   . CAD Brother     Social History Social History   Tobacco Use  . Smoking status: Never Smoker  . Smokeless tobacco: Never Used  Substance Use Topics  . Alcohol use: No  . Drug use: No     Allergies   Patient has no known allergies.   Review  of Systems Review of Systems  Cardiovascular: Positive for chest pain.  All other systems reviewed and are negative.    Physical Exam Updated Vital Signs BP (!) 123/58   Pulse (!) 54   Temp 97.7 F (36.5 C) (Oral)   Resp 15   Ht 6\' 1"  (1.854 m)   Wt 81.2 kg (179 lb)   SpO2 96%   BMI 23.62 kg/m   Physical Exam  Constitutional: He appears well-developed and well-nourished.  HENT:  Head: Normocephalic.  Neck: Normal range of motion.  Cardiovascular: Regular rhythm and normal pulses.  Pulmonary/Chest: Effort normal.  Abdominal: Soft. There is tenderness. There is guarding.  Musculoskeletal: Normal range of motion.  Neurological: He is alert.  Skin: Skin is warm.  Psychiatric: He has a normal mood and affect.  Nursing note and vitals reviewed.    ED Treatments / Results  Labs (all labs ordered are listed, but only abnormal results are displayed) Labs Reviewed  BASIC METABOLIC PANEL - Abnormal; Notable for the following components:      Result Value   Potassium 3.4 (*)    BUN 38 (*)    Creatinine, Ser 2.10 (*)    GFR calc non Af Amer 26 (*)    GFR calc Af Amer  31 (*)    All other components within normal limits  CBC - Abnormal; Notable for the following components:   Hemoglobin 10.4 (*)    HCT 35.5 (*)    MCH 23.9 (*)    MCHC 29.3 (*)    RDW 17.3 (*)    All other components within normal limits  TROPONIN I - Abnormal; Notable for the following components:   Troponin I 0.03 (*)    All other components within normal limits  I-STAT TROPONIN, ED    EKG None  Radiology Dg Chest 2 View  Result Date: 12/29/2017 CLINICAL DATA:  Chest pain EXAM: CHEST - 2 VIEW COMPARISON:  12/19/2017, 10/02/2017 FINDINGS: Post sternotomy changes. Valve prosthesis. Similar appearance of left-sided pacing device. No acute airspace disease or effusion. Stable mildly enlarged cardiomediastinal silhouette. No pneumothorax. IMPRESSION: No active cardiopulmonary disease.  Stable mild cardiomegaly. Electronically Signed   By: Jasmine Pang M.D.   On: 12/29/2017 18:33    Procedures Procedures (including critical care time)  Medications Ordered in ED Medications  morphine 4 MG/ML injection 2 mg (has no administration in time range)     Initial Impression / Assessment and Plan / ED Course  I have reviewed the triage vital signs and the nursing notes.  Pertinent labs & imaging results that were available during my care of the patient were reviewed by me and considered in my medical decision making (see chart for details).     I spoke with pt's Granddaughter.  She was able to confirm pt has chest pain.  Pt complains of cramps in his whole body. Pt complains of upper abdominal pain She confirms pt is difficult to communicate with/hard of hearing.   Ct scan of abdomen ordered.  Pt's care turned over to Dr. Blinda Leatherwood   Final Clinical Impressions(s) / ED Diagnoses   Final diagnoses:  Nonspecific chest pain  Myalgia    ED Discharge Orders    None       Osie Cheeks 12/29/17 2358    Arby Barrette, MD 01/03/18 [phone]

## 2017-12-29 NOTE — ED Notes (Signed)
Trisha MangleKaren Sophia, PA notified of Troponin results of 0.03

## 2017-12-30 ENCOUNTER — Emergency Department (HOSPITAL_COMMUNITY): Payer: Medicare Other

## 2017-12-30 DIAGNOSIS — R0789 Other chest pain: Secondary | ICD-10-CM | POA: Diagnosis not present

## 2017-12-30 LAB — TROPONIN I: Troponin I: 0.03 ng/mL (ref <0.03)

## 2017-12-30 LAB — HEPATIC FUNCTION PANEL
ALT: 14 U/L (ref 0–44)
AST: 19 U/L (ref 15–41)
Albumin: 4 g/dL (ref 3.5–5.0)
Alkaline Phosphatase: 35 U/L — ABNORMAL LOW (ref 38–126)
BILIRUBIN DIRECT: 0.2 mg/dL (ref 0.0–0.2)
BILIRUBIN INDIRECT: 1.1 mg/dL — AB (ref 0.3–0.9)
BILIRUBIN TOTAL: 1.3 mg/dL — AB (ref 0.3–1.2)
Total Protein: 7.4 g/dL (ref 6.5–8.1)

## 2017-12-30 NOTE — ED Notes (Signed)
Attempted to call grand-daughter again

## 2017-12-30 NOTE — ED Notes (Signed)
Attempted to use the interrupter line patient isn't able to hear and interrupter can't understand him

## 2017-12-30 NOTE — ED Notes (Signed)
Pt in CT.

## 2017-12-30 NOTE — ED Notes (Signed)
Grand daughter called back and she is aware patient is being discharged and [facility].

## 2017-12-30 NOTE — ED Notes (Signed)
Patient incont. Of urine, undressed and patient cleaned and linens changed.

## 2017-12-30 NOTE — ED Notes (Signed)
Attempted to reach grand-daughter she is listed as his next of kin, [phone] no answer message left.

## 2017-12-30 NOTE — ED Provider Notes (Signed)
Follow-up on work-up.  Patient was seen with complaints of chest pain, abdominal pain.  Cardiac evaluation has been negative.  Troponin negative x2.  CT abdomen and pelvis has been performed, no acute abnormality noted.   Gilda CreasePollina, Jabrea Kallstrom J, MD 12/30/17 [phone]

## 2017-12-30 NOTE — ED Provider Notes (Signed)
Medical screening examination/treatment/procedure(s) were conducted as a shared visit with non-physician practitioner(s) and myself.  I personally evaluated the patient during the encounter.  None History has been difficult to obtain due to language barrier.  Patient is from nursing home with the complaint of not eating and general weakness.  He has been exhibiting pain by touching his abdomen and chest.   Patient is alert.  He does not appear confused based on nonverbal types of communication.  No acute respiratory distress.  Heart regular.  Lungs grossly clear.  Patient expresses pain with palpation in the epigastrium.  He does not have guarding or peritoneal signs.  Agree with plan of management.   Arby BarrettePfeiffer, Emelly Wurtz, MD 01/03/18 2315

## 2018-01-03 ENCOUNTER — Encounter (HOSPITAL_COMMUNITY): Payer: Self-pay

## 2018-01-03 ENCOUNTER — Emergency Department (HOSPITAL_COMMUNITY): Payer: Medicare Other

## 2018-01-03 ENCOUNTER — Emergency Department (HOSPITAL_COMMUNITY)
Admission: EM | Admit: 2018-01-03 | Discharge: 2018-01-04 | Disposition: A | Discharge status: Home / Self Care | Payer: Medicare Other | Attending: Emergency Medicine | Admitting: Emergency Medicine

## 2018-01-03 DIAGNOSIS — Z7982 Long term (current) use of aspirin: Secondary | ICD-10-CM | POA: Insufficient documentation

## 2018-01-03 DIAGNOSIS — Z79899 Other long term (current) drug therapy: Secondary | ICD-10-CM | POA: Insufficient documentation

## 2018-01-03 DIAGNOSIS — F0390 Unspecified dementia without behavioral disturbance: Secondary | ICD-10-CM | POA: Insufficient documentation

## 2018-01-03 DIAGNOSIS — I2510 Atherosclerotic heart disease of native coronary artery without angina pectoris: Secondary | ICD-10-CM | POA: Diagnosis not present

## 2018-01-03 DIAGNOSIS — E119 Type 2 diabetes mellitus without complications: Secondary | ICD-10-CM | POA: Diagnosis not present

## 2018-01-03 DIAGNOSIS — R079 Chest pain, unspecified: Secondary | ICD-10-CM | POA: Diagnosis present

## 2018-01-03 DIAGNOSIS — N183 Chronic kidney disease, stage 3 (moderate): Secondary | ICD-10-CM | POA: Insufficient documentation

## 2018-01-03 DIAGNOSIS — Z7984 Long term (current) use of oral hypoglycemic drugs: Secondary | ICD-10-CM | POA: Diagnosis not present

## 2018-01-03 DIAGNOSIS — I5032 Chronic diastolic (congestive) heart failure: Secondary | ICD-10-CM | POA: Insufficient documentation

## 2018-01-03 DIAGNOSIS — I130 Hypertensive heart and chronic kidney disease with heart failure and stage 1 through stage 4 chronic kidney disease, or unspecified chronic kidney disease: Secondary | ICD-10-CM | POA: Diagnosis not present

## 2018-01-03 LAB — CBC WITH DIFFERENTIAL/PLATELET
ABS IMMATURE GRANULOCYTES: 0 10*3/uL (ref 0.0–0.1)
BASOS PCT: 0 %
Basophils Absolute: 0 10*3/uL (ref 0.0–0.1)
Eosinophils Absolute: 0.2 10*3/uL (ref 0.0–0.7)
Eosinophils Relative: 4 %
HCT: 36.7 % — ABNORMAL LOW (ref 39.0–52.0)
HEMOGLOBIN: 11 g/dL — AB (ref 13.0–17.0)
Immature Granulocytes: 0 %
LYMPHS PCT: 22 %
Lymphs Abs: 1.2 10*3/uL (ref 0.7–4.0)
MCH: 24.3 pg — AB (ref 26.0–34.0)
MCHC: 30 g/dL (ref 30.0–36.0)
MCV: 81.2 fL (ref 78.0–100.0)
MONO ABS: 0.5 10*3/uL (ref 0.1–1.0)
MONOS PCT: 10 %
NEUTROS ABS: 3.4 10*3/uL (ref 1.7–7.7)
Neutrophils Relative %: 64 %
Platelets: 155 10*3/uL (ref 150–400)
RBC: 4.52 MIL/uL (ref 4.22–5.81)
RDW: 17.9 % — ABNORMAL HIGH (ref 11.5–15.5)
WBC: 5.4 10*3/uL (ref 4.0–10.5)

## 2018-01-03 LAB — URINALYSIS, ROUTINE W REFLEX MICROSCOPIC
Bilirubin Urine: NEGATIVE
Glucose, UA: NEGATIVE mg/dL
Hgb urine dipstick: NEGATIVE
KETONES UR: NEGATIVE mg/dL
LEUKOCYTES UA: NEGATIVE
NITRITE: NEGATIVE
PH: 5 (ref 5.0–8.0)
Protein, ur: NEGATIVE mg/dL
SPECIFIC GRAVITY, URINE: 1.011 (ref 1.005–1.030)

## 2018-01-03 LAB — COMPREHENSIVE METABOLIC PANEL
ALK PHOS: 35 U/L — AB (ref 38–126)
ALT: 15 U/L (ref 0–44)
AST: 23 U/L (ref 15–41)
Albumin: 3.8 g/dL (ref 3.5–5.0)
Anion gap: 14 (ref 5–15)
BILIRUBIN TOTAL: 1.2 mg/dL (ref 0.3–1.2)
BUN: 45 mg/dL — ABNORMAL HIGH (ref 8–23)
CALCIUM: 9.6 mg/dL (ref 8.9–10.3)
CO2: 22 mmol/L (ref 22–32)
CREATININE: 2.14 mg/dL — AB (ref 0.61–1.24)
Chloride: 105 mmol/L (ref 98–111)
GFR calc non Af Amer: 26 mL/min — ABNORMAL LOW (ref >60)
GFR, EST AFRICAN AMERICAN: 30 mL/min — AB (ref >60)
Glucose, Bld: 66 mg/dL — ABNORMAL LOW (ref 70–99)
Potassium: 3.7 mmol/L (ref 3.5–5.1)
SODIUM: 141 mmol/L (ref 135–145)
Total Protein: 7 g/dL (ref 6.5–8.1)

## 2018-01-03 LAB — LIPASE, BLOOD: Lipase: 23 U/L (ref 11–51)

## 2018-01-03 LAB — I-STAT TROPONIN, ED: TROPONIN I, POC: 0.03 ng/mL (ref 0.00–0.08)

## 2018-01-03 MED ORDER — MORPHINE SULFATE (PF) 4 MG/ML IV SOLN
4.0000 mg | Freq: Once | INTRAVENOUS | Status: AC
  Administered 2018-01-03: 4 mg via INTRAVENOUS
  Filled 2018-01-03: qty 1

## 2018-01-03 MED ORDER — SODIUM CHLORIDE 0.9 % IV BOLUS
1000.0000 mL | Freq: Once | INTRAVENOUS | Status: AC
  Administered 2018-01-03: 1000 mL via INTRAVENOUS

## 2018-01-03 NOTE — ED Triage Notes (Signed)
Pt brought in by [facility] from [facility]. Per EMS their call was for a short period of tachypnea where pt also clutched his chest. Per EMS pt speaks only [place]ian and has a hx of dementia. Per EMS pt has a pacemaker. Pt alert but nonverbal on arrival.

## 2018-01-03 NOTE — ED Notes (Signed)
[facility] notified of pt's d/c. Pt's daughter aware as well.

## 2018-01-03 NOTE — ED Notes (Signed)
Pt moaning, gesturing to LLQ

## 2018-01-03 NOTE — ED Notes (Signed)
Urine culture tube sent down with urine sample.   

## 2018-01-03 NOTE — ED Notes (Signed)
Marcy PanningLyrika (daughter) : please update w/ pertinent information [phone]

## 2018-01-03 NOTE — ED Notes (Signed)
X-ray at bedside

## 2018-01-03 NOTE — ED Provider Notes (Signed)
[facility] EMERGENCY DEPARTMENT Provider Note   CSN: [id number] Arrival date & time: 01/03/18  1857     History   Chief Complaint Chief Complaint  Patient presents with  . Chest Pain    HPI Harold Jones is a 84 y.o. male.  He is transferred from his nursing facility for evaluation of moaning and rapid breathing.  Patient himself is demented and speaks [place].  1 of my partners who actually saw him a few days ago said that using the language interpreter was not very helpful because of his dementia.  I reached out to his granddaughter who spoke to him over the phone and she is providing some of his history.  He is complaining of whole body pain.  He cannot elicit any specific spot and he does not know why it is happening.  The granddaughter states he has been in the hospital couple times in the last few weeks and they have been unable to find what is troubling him.  Level 5 caveat secondary to dementia and language barrier.  The history is provided by the patient, the EMS personnel and a relative. The history is limited by a language barrier and the condition of the patient.  Chest Pain   This is a recurrent problem.    Past Medical History:  Diagnosis Date  . Aortic valve disorder   . BPH without obstruction/lower urinary tract symptoms   . Chest wall pain following surgery   . COGNITIVE DEFICITS DUE CEREBROVASCULAR DISEASE 09/09/2009   Qualifier: Diagnosis of  By: Delrae Alfred MD, Lanora Manis    . CORONARY ARTERY DISEASE 02/27/2007   Qualifier: Diagnosis of  By: Barbaraann Barthel MD, [place]    . CVA 02/28/2004   Annotation: embolic Qualifier: Diagnosis of  By: Barbaraann Barthel MD, [place]    . Dementia    Per paper work from [employer]  . Diabetes mellitus   . DYSLIPIDEMIA 02/27/2007   Qualifier: Diagnosis of  By: Barbaraann Barthel MD, [place]    . FIBRILLATION, ATRIAL 08/31/2002   Qualifier: Diagnosis of  By: Barbaraann Barthel MD, [place]    . Grief   . Hypertension   . Neuropathic pain    . Osteoarthritis of left knee   . Physical deconditioning   . Symptomatic bradycardia 08/16/15   [employer] PPM, Dr. Ladona Ridgel    Patient Active Problem List   Diagnosis Date Noted  . Chest pain 12/19/2017  . Chronic diastolic CHF (congestive heart failure) (HCC) 12/19/2017  . CKD (chronic kidney disease) stage 3, GFR 30-59 ml/min (HCC) 12/19/2017  . Dementia 12/19/2017  . CHF exacerbation (HCC) 02/27/2016  . Altered mental state   . Confusion   . Acute GI bleeding   . GI bleed 01/12/2016  . GIB (gastrointestinal bleeding) 01/12/2016  . Symptomatic bradycardia 08/15/2015  . Intraventricular conduction delay 08/15/2015  . S/P aortic valve replacement with bioprosthetic valve 08/15/2015  . Atrial fibrillation with slow ventricular response (HCC) 08/15/2015  . Severe sepsis (HCC) 05/30/2015  . Acute respiratory failure (HCC) 05/30/2015  . History of stroke 03/15/2015  . Benign hypertensive heart disease without heart failure 03/08/2015  . 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 BPH without obstruction/lower urinary tract symptoms 03/08/2015  . Post herpetic neuralgia 03/08/2015  . CAP (community acquired pneumonia) 03/01/2015  . Syncope and collapse 11/12/2013  . Nasal fracture 11/12/2013  . Encephalopathy acute 06/23/2013  . Diabetes mellitus, type 2 (HCC) 06/23/2013  . Hypoglycemia 06/23/2013  . Supratherapeutic INR 06/23/2013  .  Zoster 10/12/2011  . Altered mental status 10/08/2011  . GLAUCOMA 09/08/2010  . KNEE PAIN, LEFT 11/22/2009  . 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 CAROTID BRUIT, RIGHT 01/18/2009  . GASTROINTESTINAL HEMORRHAGE 09/26/2007  . OSTEOARTHRITIS 03/18/2007  . Dyslipidemia 02/27/2007  . Coronary atherosclerosis 02/27/2007  . Essential hypertension 02/25/2007  . Cerebral artery occlusion with cerebral infarction (HCC) 02/28/2004  . ASCENDING AORTIC ANEURYSM 02/28/2004  .  H/O aortic valve replacement 02/28/2004  . FIBRILLATION, ATRIAL 08/31/2002    Past Surgical History:  Procedure Laterality Date  . CARDIAC SURGERY     bioprosthetic AV replacement  . EP IMPLANTABLE DEVICE N/A 08/16/2015   [facility] Dr. Ladona Ridgel[facility]  . ESOPHAGOGASTRODUODENOSCOPY (EGD) WITH PROPOFOL N/A 01/17/2016   Procedure: ESOPHAGOGASTRODUODENOSCOPY (EGD) WITH PROPOFOL;  Surgeon: Bernette Redbirdobert Buccini, MD;  Location: [facility] ENDOSCOPY;  Service: Endoscopy;  Laterality: N/A;        Home Medications    Prior to Admission medications   Medication Sig Start Date End Date Taking? Authorizing Provider  acetaminophen (TYLENOL) 500 MG tablet Take 1,000 mg by mouth 3 (three) times daily.    [provider]  aspirin 81 MG chewable tablet Chew 1 tablet (81 mg total) by mouth daily. 02/29/16   Ghimire, Werner LeanShanker M, MD  atorvastatin (LIPITOR) 10 MG tablet Take 10 mg by mouth daily.    [provider]  citalopram (CELEXA) 10 MG tablet Take 10 mg by mouth daily.    [provider]  docusate sodium (COLACE) 100 MG capsule Take 100 mg by mouth 2 (two) times daily.    [provider]  furosemide (LASIX) 20 MG tablet Take 2 tablets (40 mg total) by mouth daily. Reported on 08/17/2015 03/01/16   Maretta BeesGhimire, Shanker M, MD  gabapentin (NEURONTIN) 300 MG capsule Take 300 mg by mouth 2 (two) times daily.    [provider]  glipiZIDE (GLUCOTROL) 5 MG tablet Take 5 mg by mouth 2 (two) times daily.  11/14/13   Esperanza SheetsBuriev, Ulugbek N, MD  hydrALAZINE (APRESOLINE) 25 MG tablet Take 1 tablet (25 mg total) by mouth 2 (two) times daily. Patient taking differently: Take 25 mg by mouth 3 (three) times daily.  02/29/16   Ghimire, Werner LeanShanker M, MD  metFORMIN (GLUCOPHAGE) 1000 MG tablet Take 1,000 mg by mouth every evening.     [provider]  nitroGLYCERIN (NITROSTAT) 0.4 MG SL tablet Place 1 tablet (0.4 mg total) under the tongue every 5 (five) minutes as needed for chest pain (if more  than 3 needed, please call your doctor.). 12/21/17   Arrien, York RamMauricio Daniel, MD  oxyCODONE (OXY IR/ROXICODONE) 5 MG immediate release tablet Take 5 mg by mouth 2 (two) times daily.    [provider]  pantoprazole (PROTONIX) 40 MG tablet Take 1 tablet (40 mg total) by mouth daily. 01/19/16   Richarda OverlieAbrol, Nayana, MD  polyethylene glycol (MIRALAX / GLYCOLAX) packet Take 17 g by mouth daily. Mix with 8 ounces of liquid and drink/Hold for loose stools    [provider]  risperiDONE (RISPERDAL) 0.5 MG tablet Take 0.5 mg by mouth daily.    [provider]  tamsulosin (FLOMAX) 0.4 MG CAPS capsule Take 0.4 mg by mouth daily.  02/12/15   [provider]    Family History Family History  Problem Relation Age of Onset  . Other Mother        old age  . CAD Father   . CAD Brother  Social History Social History   Tobacco Use  . Smoking status: Never Smoker  . Smokeless tobacco: Never Used  Substance Use Topics  . Alcohol use: No  . Drug use: No     Allergies   Patient has no known allergies.   Review of Systems Review of Systems  Unable to perform ROS: Dementia  Cardiovascular: Positive for chest pain.     Physical Exam Updated Vital Signs BP (!) 161/76 (BP Location: Right Arm)   Pulse 65   Temp 97.7 F (36.5 C) (Oral)   Resp (!) 25   Ht 6\' 1"  (1.854 m)   Wt 81.2 kg (179 lb)   SpO2 98%   BMI 23.62 kg/m   Physical Exam  Constitutional: He appears well-developed and well-nourished. No distress.  HENT:  Head: Normocephalic and atraumatic.  Eyes: Pupils are equal, round, and reactive to light.  Neck: Normal range of motion.  Cardiovascular: Normal rate, regular rhythm and normal pulses.  No murmur heard. Pulmonary/Chest: Effort normal. No accessory muscle usage. No tachypnea. He has no rhonchi. He has no rales.  Abdominal: Soft. He exhibits no mass. There is no rebound and no guarding.  Musculoskeletal: Normal range of motion.       Right  lower leg: He exhibits no tenderness.       Left lower leg: He exhibits no tenderness.  Neurological:  Patient is awake moaning not following commands but likely due to language barrier.  He is moving all extremities with no obvious deficit.  Skin: Skin is warm. Capillary refill takes less than 2 seconds. No rash noted. He is not diaphoretic.     ED Treatments / Results  Labs (all labs ordered are listed, but only abnormal results are displayed) Labs Reviewed  COMPREHENSIVE METABOLIC PANEL - Abnormal; Notable for the following components:      Result Value   Glucose, Bld 66 (*)    BUN 45 (*)    Creatinine, Ser 2.14 (*)    Alkaline Phosphatase 35 (*)    GFR calc non Af Amer 26 (*)    GFR calc Af Amer 30 (*)    All other components within normal limits  CBC WITH DIFFERENTIAL/PLATELET - Abnormal; Notable for the following components:   Hemoglobin 11.0 (*)    HCT 36.7 (*)    MCH 24.3 (*)    RDW 17.9 (*)    All other components within normal limits  LIPASE, BLOOD  URINALYSIS, ROUTINE W REFLEX MICROSCOPIC  I-STAT TROPONIN, ED    EKG EKG Interpretation  Date/Time:  Friday January 03 2018 19:06:25 EDT Ventricular Rate:  76 PR Interval:    QRS Duration: 140 QT Interval:  443 QTC Calculation: 443 R Axis:   -48 Text Interpretation:  Atrial fibrillation Ventricular premature complex Left bundle branch block similar to prior 6/19 Confirmed by Meridee Score [phone]) on 01/03/2018 7:13:55 PM   Radiology Dg Chest Port 1 View  Result Date: 01/03/2018 CLINICAL DATA:  Acute chest pain. EXAM: PORTABLE CHEST 1 VIEW COMPARISON:  12/29/2017 and prior exam FINDINGS: Cardiomegaly, aortic valve replacement and LEFT-sided pacemaker again noted in this low volume film. There is no evidence of focal airspace disease, pulmonary edema, suspicious pulmonary nodule/mass, pleural effusion, or pneumothorax. No acute bony abnormalities are identified. IMPRESSION: Cardiomegaly without evidence of acute  cardiopulmonary disease. Electronically Signed   By: Harmon Pier M.D.   On: 01/03/2018 20:08    Procedures Procedures (including critical care time)  Medications Ordered in ED Medications  sodium chloride 0.9 % bolus 1,000 mL (0 mLs Intravenous Stopped 01/03/18 2225)  morphine 4 MG/ML injection 4 mg (4 mg Intravenous Given 01/03/18 2124)     Initial Impression / Assessment and Plan / ED Course  I have reviewed the triage vital signs and the nursing notes.  Pertinent labs & imaging results that were available during my care of the patient were reviewed by me and considered in my medical decision making (see chart for details).  Clinical Course as of Jan 05 936  Fri Jan 03, 2018  2138 Reevaluated patient he is resting comfortably and not moaning.  Vital signs remained stable.  He had gotten some morphine and some IV fluids.   [MB]  2217 Talk to the patient's granddaughter Harold Jones.  Sounds like this is been going on and off for a few months where he seems uncomfortable complaining of diffuse pain.  He was admitted to [facility] last month with a slight troponin bump but ultimately ended up not having anything.  He was here a few days ago with similar complaints and ended up getting a CT abdomen and was sent back to his facility.  His labs here are essentially unchanged and I do not see an obvious reason for admission.  His granddaughter understands and she said we should send him back to his facility and they will try to get the facility doctor to take a look at him to see if he needs to be on some pain medicine.   [MB]    Clinical Course User Index [MB] Terrilee Files, MD    Final Clinical Impressions(s) / ED Diagnoses   Final diagnoses:  Nonspecific chest pain    ED Discharge Orders    None       Terrilee Files, MD 01/04/18 [phone]

## 2018-01-03 NOTE — ED Notes (Signed)
Pt given apple juice for fluid challenge. Pt tolerating well. Will continue to monitor.   

## 2018-01-03 NOTE — Discharge Instructions (Addendum)
You were evaluated in the emergency department for having pain all over.  We did a chest x-ray blood work and an EKG and could not find an obvious cause of your symptoms.  He seemed improved after getting some pain medicine and some IV fluids.  I talked to your granddaughter and feel that you can return to your facility but I think it is important that you see your regular doctor so they can reevaluate you.  Please return if any worsening symptoms.

## 2018-01-04 NOTE — ED Notes (Signed)
[facility] to transport patient to [facility], all belongs taken with PTAR.  Patient stable and back to baseline. Vitals stable at time of discharge.

## 2018-01-05 ENCOUNTER — Emergency Department (HOSPITAL_COMMUNITY)
Admission: EM | Admit: 2018-01-05 | Discharge: 2018-01-05 | Disposition: A | Discharge status: Home / Self Care | Payer: Medicare Other | Attending: Emergency Medicine | Admitting: Emergency Medicine

## 2018-01-05 ENCOUNTER — Other Ambulatory Visit: Payer: Self-pay

## 2018-01-05 ENCOUNTER — Encounter (HOSPITAL_COMMUNITY): Payer: Self-pay | Admitting: Emergency Medicine

## 2018-01-05 DIAGNOSIS — I5032 Chronic diastolic (congestive) heart failure: Secondary | ICD-10-CM | POA: Insufficient documentation

## 2018-01-05 DIAGNOSIS — Z7984 Long term (current) use of oral hypoglycemic drugs: Secondary | ICD-10-CM | POA: Diagnosis not present

## 2018-01-05 DIAGNOSIS — E1140 Type 2 diabetes mellitus with diabetic neuropathy, unspecified: Secondary | ICD-10-CM | POA: Insufficient documentation

## 2018-01-05 DIAGNOSIS — F0390 Unspecified dementia without behavioral disturbance: Secondary | ICD-10-CM | POA: Insufficient documentation

## 2018-01-05 DIAGNOSIS — R52 Pain, unspecified: Secondary | ICD-10-CM

## 2018-01-05 DIAGNOSIS — Z79899 Other long term (current) drug therapy: Secondary | ICD-10-CM | POA: Insufficient documentation

## 2018-01-05 DIAGNOSIS — N183 Chronic kidney disease, stage 3 (moderate): Secondary | ICD-10-CM | POA: Diagnosis not present

## 2018-01-05 DIAGNOSIS — Z7982 Long term (current) use of aspirin: Secondary | ICD-10-CM | POA: Insufficient documentation

## 2018-01-05 DIAGNOSIS — M79606 Pain in leg, unspecified: Secondary | ICD-10-CM | POA: Diagnosis present

## 2018-01-05 DIAGNOSIS — I130 Hypertensive heart and chronic kidney disease with heart failure and stage 1 through stage 4 chronic kidney disease, or unspecified chronic kidney disease: Secondary | ICD-10-CM | POA: Insufficient documentation

## 2018-01-05 NOTE — ED Provider Notes (Signed)
[facility]-EMERGENCY DEPT Provider Note   CSN: [id number] Arrival date & time: 01/05/18  0116     History   Chief Complaint Chief Complaint  Patient presents with  . Leg Pain    HPI Harold Jones is a 84 y.o. male.  85 yo M with a cc of leg pain.  This was reported by the nursing home.  The patient has been seen multiple times in the emergency department recently for similar complaints.  He was in the hospital last week and had serial troponins and a CT of the abdomen pelvis.  There is no known etiology.  He is a difficult patient to communicate with this he has advanced dementia and also speaks [place].  There is been multiple times in the past to try and communicate with him but were unable to via interpretation services.  The story from the nursing home was that he had worsening pain this evening and so he was sent to the emergency department.  Patient has no complaints and is sleeping when I showed up in his room.  Does not point of any areas of pain.  The history is provided by the patient.  Leg Pain    Illness  This is a new problem. The current episode started more than 1 week ago. The problem occurs constantly. The problem has not changed since onset.Pertinent negatives include no chest pain, no abdominal pain, no headaches and no shortness of breath. Nothing aggravates the symptoms. Nothing relieves the symptoms.    Past Medical History:  Diagnosis Date  . Aortic valve disorder   . BPH without obstruction/lower urinary tract symptoms   . Chest wall pain following surgery   . COGNITIVE DEFICITS DUE CEREBROVASCULAR DISEASE 09/09/2009   Qualifier: Diagnosis of  By: Delrae Alfred MD, Lanora Manis    . CORONARY ARTERY DISEASE 02/27/2007   Qualifier: Diagnosis of  By: Barbaraann Barthel MD, [place]    . CVA 02/28/2004   Annotation: embolic Qualifier: Diagnosis of  By: Barbaraann Barthel MD, [place]    . Dementia    Per paper work from [employer]  . Diabetes mellitus   .  DYSLIPIDEMIA 02/27/2007   Qualifier: Diagnosis of  By: Barbaraann Barthel MD, [place]    . FIBRILLATION, ATRIAL 08/31/2002   Qualifier: Diagnosis of  By: Barbaraann Barthel MD, [place]    . Grief   . Hypertension   . Neuropathic pain   . Osteoarthritis of left knee   . Physical deconditioning   . Symptomatic bradycardia 08/16/15   [employer] PPM, Dr. Ladona Ridgel    Patient Active Problem List   Diagnosis Date Noted  . Chest pain 12/19/2017  . Chronic diastolic CHF (congestive heart failure) (HCC) 12/19/2017  . CKD (chronic kidney disease) stage 3, GFR 30-59 ml/min (HCC) 12/19/2017  . Dementia 12/19/2017  . CHF exacerbation (HCC) 02/27/2016  . Altered mental state   . Confusion   . Acute GI bleeding   . GI bleed 01/12/2016  . GIB (gastrointestinal bleeding) 01/12/2016  . Symptomatic bradycardia 08/15/2015  . Intraventricular conduction delay 08/15/2015  . S/P aortic valve replacement with bioprosthetic valve 08/15/2015  . Atrial fibrillation with slow ventricular response (HCC) 08/15/2015  . Severe sepsis (HCC) 05/30/2015  . Acute respiratory failure (HCC) 05/30/2015  . History of stroke 03/15/2015  . Benign hypertensive heart disease without heart failure 03/08/2015  . 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 BPH without obstruction/lower urinary tract symptoms 03/08/2015  . Post herpetic neuralgia 03/08/2015  .  CAP (community acquired pneumonia) 03/01/2015  . Syncope and collapse 11/12/2013  . Nasal fracture 11/12/2013  . Encephalopathy acute 06/23/2013  . Diabetes mellitus, type 2 (HCC) 06/23/2013  . Hypoglycemia 06/23/2013  . Supratherapeutic INR 06/23/2013  . Zoster 10/12/2011  . Altered mental status 10/08/2011  . GLAUCOMA 09/08/2010  . KNEE PAIN, LEFT 11/22/2009  . 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 CAROTID BRUIT, RIGHT 01/18/2009  . GASTROINTESTINAL HEMORRHAGE 09/26/2007  . OSTEOARTHRITIS 03/18/2007  .  Dyslipidemia 02/27/2007  . Coronary atherosclerosis 02/27/2007  . Essential hypertension 02/25/2007  . Cerebral artery occlusion with cerebral infarction (HCC) 02/28/2004  . ASCENDING AORTIC ANEURYSM 02/28/2004  . H/O aortic valve replacement 02/28/2004  . FIBRILLATION, ATRIAL 08/31/2002    Past Surgical History:  Procedure Laterality Date  . CARDIAC SURGERY     bioprosthetic AV replacement  . EP IMPLANTABLE DEVICE N/A 08/16/2015   [facility] [employer] PPM, Dr. Ladona Ridgel  . ESOPHAGOGASTRODUODENOSCOPY (EGD) WITH PROPOFOL N/A 01/17/2016   Procedure: ESOPHAGOGASTRODUODENOSCOPY (EGD) WITH PROPOFOL;  Surgeon: Bernette Redbird, MD;  Location: [facility] ENDOSCOPY;  Service: Endoscopy;  Laterality: N/A;        Home Medications    Prior to Admission medications   Medication Sig Start Date End Date Taking? Authorizing Provider  acetaminophen (TYLENOL) 500 MG tablet Take 1,000 mg by mouth 3 (three) times daily.    [provider]  aspirin 81 MG chewable tablet Chew 1 tablet (81 mg total) by mouth daily. 02/29/16   Ghimire, Werner Lean, MD  atorvastatin (LIPITOR) 10 MG tablet Take 10 mg by mouth daily.    [provider]  citalopram (CELEXA) 10 MG tablet Take 10 mg by mouth daily.    [provider]  docusate sodium (COLACE) 100 MG capsule Take 100 mg by mouth 2 (two) times daily.    [provider]  furosemide (LASIX) 20 MG tablet Take 2 tablets (40 mg total) by mouth daily. Reported on 08/17/2015 03/01/16   Maretta Bees, MD  gabapentin (NEURONTIN) 300 MG capsule Take 300 mg by mouth 2 (two) times daily.    [provider]  glipiZIDE (GLUCOTROL) 5 MG tablet Take 5 mg by mouth 2 (two) times daily.  11/14/13   Esperanza Sheets, MD  hydrALAZINE (APRESOLINE) 25 MG tablet Take 1 tablet (25 mg total) by mouth 2 (two) times daily. Patient taking differently: Take 25 mg by mouth 3 (three) times daily.  02/29/16   Ghimire, Werner Lean, MD  LORazepam (ATIVAN) 0.5 MG tablet  Take 0.5 mg by mouth 2 (two) times daily.    [provider]  metFORMIN (GLUCOPHAGE) 1000 MG tablet Take 1,000 mg by mouth every evening.     [provider]  nitroGLYCERIN (NITROSTAT) 0.4 MG SL tablet Place 1 tablet (0.4 mg total) under the tongue every 5 (five) minutes as needed for chest pain (if more than 3 needed, please call your doctor.). 12/21/17   Arrien, York Ram, MD  pantoprazole (PROTONIX) 40 MG tablet Take 1 tablet (40 mg total) by mouth daily. 01/19/16   Richarda Overlie, MD  polyethylene glycol (MIRALAX / GLYCOLAX) packet Take 17 g by mouth daily. Mix with 8 ounces of liquid and drink/Hold for loose stools    [provider]  risperiDONE (RISPERDAL) 0.5 MG tablet Take 0.5 mg by mouth daily. Additional Order: Give 0.25mg  by mouth twice daily as needed for aggitation    [provider]  tamsulosin (FLOMAX) 0.4 MG CAPS capsule Take 0.4  mg by mouth daily.  02/12/15   [provider]  traMADol (ULTRAM) 50 MG tablet Take 50 mg by mouth 2 (two) times daily.    [provider]    Family History Family History  Problem Relation Age of Onset  . Other Mother        old age  . CAD Father   . CAD Brother     Social History Social History   Tobacco Use  . Smoking status: Never Smoker  . Smokeless tobacco: Never Used  Substance Use Topics  . Alcohol use: No  . Drug use: No     Allergies   Patient has no known allergies.   Review of Systems Review of Systems  Unable to perform ROS: Dementia  Respiratory: Negative for shortness of breath.   Cardiovascular: Negative for chest pain.  Gastrointestinal: Negative for abdominal pain.  Neurological: Negative for headaches.     Physical Exam Updated Vital Signs BP (!) 166/74 (BP Location: Right Arm)   Pulse 60   Temp 97.6 F (36.4 C) (Oral)   Resp 18   SpO2 96%   Physical Exam  Constitutional: He is oriented to person, place, and time. He appears well-developed and  well-nourished.  Sleeping on my arrival to the room appears in no acute distress  HENT:  Head: Normocephalic and atraumatic.  Eyes: Pupils are equal, round, and reactive to light. EOM are normal.  Neck: Normal range of motion. Neck supple. No JVD present.  Cardiovascular: Normal rate and regular rhythm. Exam reveals no gallop and no friction rub.  No murmur heard. Pulmonary/Chest: No respiratory distress. He has no wheezes.  Abdominal: He exhibits no distension and no mass. There is no tenderness. There is no rebound and no guarding.  Musculoskeletal: Normal range of motion.  Palpated the patient from head to toe without any noted areas of bony tenderness.  No signs of trauma.  Full range of motion of bilateral lower extremities.  No muscular tenderness to the lower extremities.  Neurological: He is alert and oriented to person, place, and time.  Skin: No rash noted. No pallor.  Psychiatric: He has a normal mood and affect. His behavior is normal.  Nursing note and vitals reviewed.    ED Treatments / Results  Labs (all labs ordered are listed, but only abnormal results are displayed) Labs Reviewed - No data to display  EKG None  Radiology Dg Chest [facility] 1 View  Result Date: 01/03/2018 CLINICAL DATA:  Acute chest pain. EXAM: PORTABLE CHEST 1 VIEW COMPARISON:  12/29/2017 and prior exam FINDINGS: Cardiomegaly, aortic valve replacement and LEFT-sided pacemaker again noted in this low volume film. There is no evidence of focal airspace disease, pulmonary edema, suspicious pulmonary nodule/mass, pleural effusion, or pneumothorax. No acute bony abnormalities are identified. IMPRESSION: Cardiomegaly without evidence of acute cardiopulmonary disease. Electronically Signed   By: Harmon Pier M.D.   On: 01/03/2018 20:08    Procedures Procedures (including critical care time)  Medications Ordered in ED Medications - No data to display   Initial Impression / Assessment and Plan / ED Course    I have reviewed the triage vital signs and the nursing notes.  Pertinent labs & imaging results that were available during my care of the patient were reviewed by me and considered in my medical decision making (see chart for details).     84 yo M with a chief complaint of pain.  I am unsure of the etiology of the patient's  symptoms.  Is difficult to communicate with him as he does not speak [place] and has advanced dementia.  On my exam the patient is well-appearing and nontoxic he has no focal tenderness on palpation throughout his entire body.  There is no signs of trauma.  He was in the hospital yesterday and had labs drawn I do not feel that these need to be repeated he also had a recent CT scan of the abdomen pelvis.  He has no tenderness to my abdominal exam.  He has no tenderness to his legs.  Potentially his symptoms are from sundowning or from his dementia.  Will discharge home.  PCP follow-up.  3:09 AM:  I have discussed the diagnosis/risks/treatment options with the patient and family and believe the pt to be eligible for discharge home to follow-up with PCP. We also discussed returning to the ED immediately if new or worsening sx occur. We discussed the sx which are most concerning (e.g., sudden worsening pain, fever, inability to tolerate by mouth) that necessitate immediate return. Medications administered to the patient during their visit and any new prescriptions provided to the patient are listed below.  Medications given during this visit Medications - No data to display    The patient appears reasonably screen and/or stabilized for discharge and I doubt any other medical condition or other [facility] requiring further screening, evaluation, or treatment in the ED at this time prior to discharge.    Final Clinical Impressions(s) / ED Diagnoses   Final diagnoses:  Pain    ED Discharge Orders    None       Melene PlanFloyd, Vyolet Sakuma, DO 01/05/18 0309

## 2018-01-05 NOTE — ED Notes (Signed)
Bed: WA21 Expected date:  Expected time:  Means of arrival:  Comments: 

## 2018-01-05 NOTE — ED Triage Notes (Signed)
Pt BIB EMS from [facility] with complaints of leg pain. Patient is russian speaking only with a hx dementia. Patient per EMS, "is pulling at his legs like they hurt." Patient known for having chronic leg pain. Patient was seen at [facility] yesterday for the same.

## 2018-01-05 NOTE — ED Notes (Signed)
Discharge instructions given to EMS for facility. Report called to facility. Pt transported via [facility] to [facility].

## 2018-01-11 ENCOUNTER — Other Ambulatory Visit: Payer: Self-pay

## 2018-01-11 ENCOUNTER — Emergency Department (HOSPITAL_COMMUNITY)
Admission: EM | Admit: 2018-01-11 | Discharge: 2018-01-11 | Disposition: A | Discharge status: Home / Self Care | Payer: Medicare Other | Attending: Emergency Medicine | Admitting: Emergency Medicine

## 2018-01-11 ENCOUNTER — Encounter (HOSPITAL_COMMUNITY): Payer: Self-pay | Admitting: Emergency Medicine

## 2018-01-11 DIAGNOSIS — Z8673 Personal history of transient ischemic attack (TIA), and cerebral infarction without residual deficits: Secondary | ICD-10-CM | POA: Diagnosis not present

## 2018-01-11 DIAGNOSIS — I4891 Unspecified atrial fibrillation: Secondary | ICD-10-CM | POA: Insufficient documentation

## 2018-01-11 DIAGNOSIS — W19XXXA Unspecified fall, initial encounter: Secondary | ICD-10-CM

## 2018-01-11 DIAGNOSIS — I130 Hypertensive heart and chronic kidney disease with heart failure and stage 1 through stage 4 chronic kidney disease, or unspecified chronic kidney disease: Secondary | ICD-10-CM | POA: Diagnosis not present

## 2018-01-11 DIAGNOSIS — N183 Chronic kidney disease, stage 3 (moderate): Secondary | ICD-10-CM | POA: Diagnosis not present

## 2018-01-11 DIAGNOSIS — E1122 Type 2 diabetes mellitus with diabetic chronic kidney disease: Secondary | ICD-10-CM | POA: Diagnosis not present

## 2018-01-11 DIAGNOSIS — Z79899 Other long term (current) drug therapy: Secondary | ICD-10-CM | POA: Insufficient documentation

## 2018-01-11 DIAGNOSIS — Z7982 Long term (current) use of aspirin: Secondary | ICD-10-CM | POA: Diagnosis not present

## 2018-01-11 DIAGNOSIS — Z043 Encounter for examination and observation following other accident: Secondary | ICD-10-CM | POA: Diagnosis not present

## 2018-01-11 DIAGNOSIS — E785 Hyperlipidemia, unspecified: Secondary | ICD-10-CM | POA: Insufficient documentation

## 2018-01-11 DIAGNOSIS — I2510 Atherosclerotic heart disease of native coronary artery without angina pectoris: Secondary | ICD-10-CM | POA: Diagnosis not present

## 2018-01-11 DIAGNOSIS — F0390 Unspecified dementia without behavioral disturbance: Secondary | ICD-10-CM | POA: Insufficient documentation

## 2018-01-11 DIAGNOSIS — I5032 Chronic diastolic (congestive) heart failure: Secondary | ICD-10-CM | POA: Diagnosis not present

## 2018-01-11 DIAGNOSIS — Z7984 Long term (current) use of oral hypoglycemic drugs: Secondary | ICD-10-CM | POA: Insufficient documentation

## 2018-01-11 NOTE — Discharge Instructions (Addendum)
It was our pleasure to provide your ER care today - we hope that you feel better.  Fall precautions.   Follow up with primary care doctor in the next few days.   Return to ER if worse, new symptoms, new or severe pain, fevers, other emergency.

## 2018-01-11 NOTE — ED Notes (Signed)
Bed: WA04 Expected date:  Expected time:  Means of arrival:  Comments: 

## 2018-01-11 NOTE — ED Provider Notes (Signed)
[facility]-EMERGENCY DEPT Provider Note   CSN: [id number] Arrival date & time: 01/11/18  0734     History   Chief Complaint Chief Complaint  Patient presents with  . Fall    HPI Harold Jones is a 84 y.o. male.  Patient with hx dementia, from ecf after being found on floor. Upon finding patient, he was awake and alert. No loc noted. Pt content appearing, in on apparent pain or distress. Pt w dementia - level 5 caveat. Attempted additional community via interpreter line, but patient not participating with interpreter. Pt moves bilateral extremities with good strength, without pain. No fever.  The history is provided by the patient and the EMS personnel. The history is limited by the condition of the patient. A language interpreter was used.    Past Medical History:  Diagnosis Date  . Aortic valve disorder   . BPH without obstruction/lower urinary tract symptoms   . Chest wall pain following surgery   . COGNITIVE DEFICITS DUE CEREBROVASCULAR DISEASE 09/09/2009   Qualifier: Diagnosis of  By: Delrae Alfred MD, Lanora Manis    . CORONARY ARTERY DISEASE 02/27/2007   Qualifier: Diagnosis of  By: Barbaraann Barthel MD, [place]    . CVA 02/28/2004   Annotation: embolic Qualifier: Diagnosis of  By: Barbaraann Barthel MD, [place]    . Dementia    Per paper work from [employer]  . Diabetes mellitus   . DYSLIPIDEMIA 02/27/2007   Qualifier: Diagnosis of  By: Barbaraann Barthel MD, [place]    . FIBRILLATION, ATRIAL 08/31/2002   Qualifier: Diagnosis of  By: Barbaraann Barthel MD, [place]    . Grief   . Hypertension   . Neuropathic pain   . Osteoarthritis of left knee   . Physical deconditioning   . Symptomatic bradycardia 08/16/15   [employer] PPM, Dr. Ladona Ridgel    Patient Active Problem List   Diagnosis Date Noted  . Chest pain 12/19/2017  . Chronic diastolic CHF (congestive heart failure) (HCC) 12/19/2017  . CKD (chronic kidney disease) stage 3, GFR 30-59 ml/min (HCC) 12/19/2017  . Dementia  12/19/2017  . CHF exacerbation (HCC) 02/27/2016  . Altered mental state   . Confusion   . Acute GI bleeding   . GI bleed 01/12/2016  . GIB (gastrointestinal bleeding) 01/12/2016  . Symptomatic bradycardia 08/15/2015  . Intraventricular conduction delay 08/15/2015  . S/P aortic valve replacement with bioprosthetic valve 08/15/2015  . Atrial fibrillation with slow ventricular response (HCC) 08/15/2015  . Severe sepsis (HCC) 05/30/2015  . Acute respiratory failure (HCC) 05/30/2015  . History of stroke 03/15/2015  . Benign hypertensive heart disease without heart failure 03/08/2015  . 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 BPH without obstruction/lower urinary tract symptoms 03/08/2015  . Post herpetic neuralgia 03/08/2015  . CAP (community acquired pneumonia) 03/01/2015  . Syncope and collapse 11/12/2013  . Nasal fracture 11/12/2013  . Encephalopathy acute 06/23/2013  . Diabetes mellitus, type 2 (HCC) 06/23/2013  . Hypoglycemia 06/23/2013  . Supratherapeutic INR 06/23/2013  . Zoster 10/12/2011  . Altered mental status 10/08/2011  . GLAUCOMA 09/08/2010  . KNEE PAIN, LEFT 11/22/2009  . 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 CAROTID BRUIT, RIGHT 01/18/2009  . GASTROINTESTINAL HEMORRHAGE 09/26/2007  . OSTEOARTHRITIS 03/18/2007  . Dyslipidemia 02/27/2007  . Coronary atherosclerosis 02/27/2007  . Essential hypertension 02/25/2007  . Cerebral artery occlusion with cerebral infarction (HCC) 02/28/2004  . ASCENDING AORTIC ANEURYSM 02/28/2004  . H/O aortic valve replacement 02/28/2004  .  FIBRILLATION, ATRIAL 08/31/2002    Past Surgical History:  Procedure Laterality Date  . CARDIAC SURGERY     bioprosthetic AV replacement  . EP IMPLANTABLE DEVICE N/A 08/16/2015   [facility] [employer] PPM, Dr. Ladona Ridgel  . ESOPHAGOGASTRODUODENOSCOPY (EGD) WITH PROPOFOL N/A 01/17/2016   Procedure: ESOPHAGOGASTRODUODENOSCOPY (EGD)  WITH PROPOFOL;  Surgeon: Bernette Redbird, MD;  Location: [facility] ENDOSCOPY;  Service: Endoscopy;  Laterality: N/A;        Home Medications    Prior to Admission medications   Medication Sig Start Date End Date Taking? Authorizing Provider  acetaminophen (TYLENOL) 500 MG tablet Take 1,000 mg by mouth 3 (three) times daily.    [provider]  aspirin 81 MG chewable tablet Chew 1 tablet (81 mg total) by mouth daily. 02/29/16   Ghimire, Werner Lean, MD  atorvastatin (LIPITOR) 10 MG tablet Take 10 mg by mouth daily.    [provider]  citalopram (CELEXA) 10 MG tablet Take 10 mg by mouth daily.    [provider]  docusate sodium (COLACE) 100 MG capsule Take 100 mg by mouth 2 (two) times daily.    [provider]  furosemide (LASIX) 20 MG tablet Take 2 tablets (40 mg total) by mouth daily. Reported on 08/17/2015 03/01/16   Maretta Bees, MD  gabapentin (NEURONTIN) 300 MG capsule Take 300 mg by mouth 2 (two) times daily.    [provider]  glipiZIDE (GLUCOTROL) 5 MG tablet Take 5 mg by mouth 2 (two) times daily.  11/14/13   Esperanza Sheets, MD  hydrALAZINE (APRESOLINE) 25 MG tablet Take 1 tablet (25 mg total) by mouth 2 (two) times daily. Patient taking differently: Take 25 mg by mouth 3 (three) times daily.  02/29/16   Ghimire, Werner Lean, MD  LORazepam (ATIVAN) 0.5 MG tablet Take 0.5 mg by mouth 2 (two) times daily.    [provider]  metFORMIN (GLUCOPHAGE) 1000 MG tablet Take 1,000 mg by mouth every evening.     [provider]  nitroGLYCERIN (NITROSTAT) 0.4 MG SL tablet Place 1 tablet (0.4 mg total) under the tongue every 5 (five) minutes as needed for chest pain (if more than 3 needed, please call your doctor.). 12/21/17   Arrien, York Ram, MD  pantoprazole (PROTONIX) 40 MG tablet Take 1 tablet (40 mg total) by mouth daily. 01/19/16   Richarda Overlie, MD  polyethylene glycol (MIRALAX / GLYCOLAX) packet Take 17 g by mouth daily. Mix  with 8 ounces of liquid and drink/Hold for loose stools    [provider]  risperiDONE (RISPERDAL) 0.5 MG tablet Take 0.5 mg by mouth daily. Additional Order: Give 0.25mg  by mouth twice daily as needed for aggitation    [provider]  tamsulosin (FLOMAX) 0.4 MG CAPS capsule Take 0.4 mg by mouth daily.  02/12/15   [provider]  traMADol (ULTRAM) 50 MG tablet Take 50 mg by mouth 2 (two) times daily.    [provider]    Family History Family History  Problem Relation Age of Onset  . Other Mother        old age  . CAD Father   . CAD Brother     Social History Social History   Tobacco Use  . Smoking status: Never Smoker  . Smokeless tobacco: Never Used  Substance Use Topics  . Alcohol use: No  . Drug use: No     Allergies   Patient has no known allergies.   Review of Systems Review  of Systems  Unable to perform ROS: Dementia  level 5 caveat - dementia.    Physical Exam Updated Vital Signs BP (!) 146/76 (BP Location: Left Arm)   Pulse 66   Temp 98.2 F (36.8 C) (Oral)   Resp 14   SpO2 98%   Physical Exam  Constitutional: He appears well-developed and well-nourished.  HENT:  Head: Atraumatic.  Mouth/Throat: Oropharynx is clear and moist.  No facial or scalp sts or tenderness.   Eyes: Conjunctivae are normal. No scleral icterus.  Left pupil irregular (prior surgery). Right pupil 3 mm round, reactive.   Neck: Neck supple. No tracheal deviation present.  Cardiovascular: Normal rate, regular rhythm, normal heart sounds and intact distal pulses.  Pulmonary/Chest: Effort normal and breath sounds normal. No accessory muscle usage. No respiratory distress. He exhibits no tenderness.  Abdominal: Soft. Bowel sounds are normal. He exhibits no distension. There is no tenderness.  Genitourinary:  Genitourinary Comments: Normal external gu. No cva/flank tenderness.   Musculoskeletal: He exhibits no edema.  CTLS spine, non tender,  aligned, no step off. Good rom bil extremities without pain or focal bony tenderness.   Neurological: He is alert.  Awake and alert. Appears content, and in no acute distress. Moves bil extremities purposefully. Not compliant with much of exam.   Skin: Skin is warm and dry. No rash noted.  Psychiatric: He has a normal mood and affect.  Nursing note and vitals reviewed.    ED Treatments / Results  Labs (all labs ordered are listed, but only abnormal results are displayed) Labs Reviewed - No data to display  EKG None  Radiology No results found.  Procedures Procedures (including critical care time)  Medications Ordered in ED Medications - No data to display   Initial Impression / Assessment and Plan / ED Course  I have reviewed the triage vital signs and the nursing notes.  Pertinent labs & imaging results that were available during my care of the patient were reviewed by me and considered in my medical decision making (see chart for details).  On exam, no apparent pain or discomfort. Spine non tender.  No focal bony tenderness. Afebrile.   Reviewed nursing notes and prior charts for additional history.   Patient currently appears stable for d/c.     Final Clinical Impressions(s) / ED Diagnoses   Final diagnoses:  None    ED Discharge Orders    None       Cathren LaineSteinl, Gweneth Fredlund, MD 01/11/18 [phone]

## 2018-01-11 NOTE — ED Triage Notes (Signed)
Per [facility], pt from [facility] with unwitnessed fall. Was seen in bed at 1am and then found on bathroom floor at 6am. No obvious injury or deformity. Speaks [place].

## 2018-01-16 ENCOUNTER — Encounter: Payer: Self-pay | Admitting: Cardiology

## 2018-01-17 ENCOUNTER — Other Ambulatory Visit (HOSPITAL_COMMUNITY): Payer: Self-pay | Admitting: Internal Medicine

## 2018-01-17 DIAGNOSIS — R1310 Dysphagia, unspecified: Secondary | ICD-10-CM

## 2018-01-20 ENCOUNTER — Encounter (HOSPITAL_COMMUNITY): Payer: Self-pay | Admitting: *Deleted

## 2018-01-20 ENCOUNTER — Inpatient Hospital Stay (HOSPITAL_COMMUNITY)
Admission: EM | Admit: 2018-01-20 | Discharge: 2018-01-22 | DRG: 292 | Disposition: A | Discharge status: Hospice | Payer: Medicare Other | Attending: Family Medicine | Admitting: Family Medicine

## 2018-01-20 DIAGNOSIS — N179 Acute kidney failure, unspecified: Secondary | ICD-10-CM | POA: Diagnosis not present

## 2018-01-20 DIAGNOSIS — E860 Dehydration: Secondary | ICD-10-CM | POA: Diagnosis present

## 2018-01-20 DIAGNOSIS — Z515 Encounter for palliative care: Secondary | ICD-10-CM | POA: Diagnosis present

## 2018-01-20 DIAGNOSIS — I4891 Unspecified atrial fibrillation: Secondary | ICD-10-CM | POA: Diagnosis present

## 2018-01-20 DIAGNOSIS — Z79899 Other long term (current) drug therapy: Secondary | ICD-10-CM

## 2018-01-20 DIAGNOSIS — G9340 Encephalopathy, unspecified: Secondary | ICD-10-CM | POA: Diagnosis present

## 2018-01-20 DIAGNOSIS — I130 Hypertensive heart and chronic kidney disease with heart failure and stage 1 through stage 4 chronic kidney disease, or unspecified chronic kidney disease: Secondary | ICD-10-CM | POA: Diagnosis not present

## 2018-01-20 DIAGNOSIS — N183 Chronic kidney disease, stage 3 unspecified: Secondary | ICD-10-CM | POA: Diagnosis present

## 2018-01-20 DIAGNOSIS — Z66 Do not resuscitate: Secondary | ICD-10-CM | POA: Diagnosis present

## 2018-01-20 DIAGNOSIS — Z681 Body mass index (BMI) 19 or less, adult: Secondary | ICD-10-CM

## 2018-01-20 DIAGNOSIS — I5032 Chronic diastolic (congestive) heart failure: Secondary | ICD-10-CM | POA: Diagnosis present

## 2018-01-20 DIAGNOSIS — I10 Essential (primary) hypertension: Secondary | ICD-10-CM

## 2018-01-20 DIAGNOSIS — F419 Anxiety disorder, unspecified: Secondary | ICD-10-CM | POA: Diagnosis present

## 2018-01-20 DIAGNOSIS — E870 Hyperosmolality and hypernatremia: Secondary | ICD-10-CM | POA: Diagnosis present

## 2018-01-20 DIAGNOSIS — F329 Major depressive disorder, single episode, unspecified: Secondary | ICD-10-CM | POA: Diagnosis present

## 2018-01-20 DIAGNOSIS — E119 Type 2 diabetes mellitus without complications: Secondary | ICD-10-CM

## 2018-01-20 DIAGNOSIS — F0391 Unspecified dementia with behavioral disturbance: Secondary | ICD-10-CM | POA: Diagnosis not present

## 2018-01-20 DIAGNOSIS — Z8249 Family history of ischemic heart disease and other diseases of the circulatory system: Secondary | ICD-10-CM

## 2018-01-20 DIAGNOSIS — E1122 Type 2 diabetes mellitus with diabetic chronic kidney disease: Secondary | ICD-10-CM | POA: Diagnosis present

## 2018-01-20 DIAGNOSIS — I2510 Atherosclerotic heart disease of native coronary artery without angina pectoris: Secondary | ICD-10-CM | POA: Diagnosis present

## 2018-01-20 DIAGNOSIS — F0390 Unspecified dementia without behavioral disturbance: Secondary | ICD-10-CM | POA: Diagnosis present

## 2018-01-20 DIAGNOSIS — Z7982 Long term (current) use of aspirin: Secondary | ICD-10-CM

## 2018-01-20 DIAGNOSIS — Z7984 Long term (current) use of oral hypoglycemic drugs: Secondary | ICD-10-CM

## 2018-01-20 DIAGNOSIS — K219 Gastro-esophageal reflux disease without esophagitis: Secondary | ICD-10-CM | POA: Diagnosis present

## 2018-01-20 DIAGNOSIS — Z8673 Personal history of transient ischemic attack (TIA), and cerebral infarction without residual deficits: Secondary | ICD-10-CM

## 2018-01-20 DIAGNOSIS — E440 Moderate protein-calorie malnutrition: Secondary | ICD-10-CM

## 2018-01-20 DIAGNOSIS — R197 Diarrhea, unspecified: Secondary | ICD-10-CM | POA: Diagnosis present

## 2018-01-20 LAB — CBC
HEMATOCRIT: 46.3 % (ref 39.0–52.0)
Hemoglobin: 14.2 g/dL (ref 13.0–17.0)
MCH: 25.5 pg — AB (ref 26.0–34.0)
MCHC: 30.7 g/dL (ref 30.0–36.0)
MCV: 83.1 fL (ref 78.0–100.0)
Platelets: 146 10*3/uL — ABNORMAL LOW (ref 150–400)
RBC: 5.57 MIL/uL (ref 4.22–5.81)
RDW: 20.2 % — ABNORMAL HIGH (ref 11.5–15.5)
WBC: 8.2 10*3/uL (ref 4.0–10.5)

## 2018-01-20 LAB — COMPREHENSIVE METABOLIC PANEL
ALBUMIN: 4.4 g/dL (ref 3.5–5.0)
ALT: 37 U/L (ref 0–44)
AST: 49 U/L — AB (ref 15–41)
Alkaline Phosphatase: 54 U/L (ref 38–126)
Anion gap: 14 (ref 5–15)
BUN: 85 mg/dL — AB (ref 8–23)
CHLORIDE: 124 mmol/L — AB (ref 98–111)
CO2: 24 mmol/L (ref 22–32)
Calcium: 10.9 mg/dL — ABNORMAL HIGH (ref 8.9–10.3)
Creatinine, Ser: 2.43 mg/dL — ABNORMAL HIGH (ref 0.61–1.24)
GFR calc Af Amer: 26 mL/min — ABNORMAL LOW (ref >60)
GFR calc non Af Amer: 22 mL/min — ABNORMAL LOW (ref >60)
GLUCOSE: 211 mg/dL — AB (ref 70–99)
POTASSIUM: 4.3 mmol/L (ref 3.5–5.1)
Sodium: 162 mmol/L (ref 135–145)
Total Bilirubin: 1.6 mg/dL — ABNORMAL HIGH (ref 0.3–1.2)
Total Protein: 8.2 g/dL — ABNORMAL HIGH (ref 6.5–8.1)

## 2018-01-20 LAB — LIPASE, BLOOD: LIPASE: 43 U/L (ref 11–51)

## 2018-01-20 MED ORDER — INSULIN ASPART 100 UNIT/ML ~~LOC~~ SOLN
0.0000 [IU] | Freq: Three times a day (TID) | SUBCUTANEOUS | Status: DC
  Administered 2018-01-21: 2 [IU] via SUBCUTANEOUS
  Administered 2018-01-21 – 2018-01-22 (×2): 1 [IU] via SUBCUTANEOUS
  Administered 2018-01-22: 2 [IU] via SUBCUTANEOUS

## 2018-01-20 MED ORDER — SODIUM CHLORIDE 0.9 % IV SOLN: INTRAVENOUS | Status: DC

## 2018-01-20 MED ORDER — SODIUM CHLORIDE 0.9 % IV BOLUS
1000.0000 mL | Freq: Once | INTRAVENOUS | Status: AC
  Administered 2018-01-20: 1000 mL via INTRAVENOUS

## 2018-01-20 NOTE — ED Provider Notes (Signed)
[facility]-EMERGENCY DEPT Provider Note   CSN: [id number] Arrival date & time: 01/20/18  2008     History   Chief Complaint Chief Complaint  Patient presents with  . Diarrhea    HPI Harold Jones is a 84 y.o. male with a past medical history of dementia, diabetes, CAD, hypertension who presents to ED for evaluation of diarrhea.  Patient was sent [facility].  He has been having persistent diarrhea for 2 days.  Patient is unable to provide any further information due to dementia and language barrier.  I attempted to use the interpreter with no success.  HPI  Past Medical History:  Diagnosis Date  . Aortic valve disorder   . BPH without obstruction/lower urinary tract symptoms   . Chest wall pain following surgery   . COGNITIVE DEFICITS DUE CEREBROVASCULAR DISEASE 09/09/2009   Qualifier: Diagnosis of  By: Delrae AlfredMulberry MD, Lanora ManisElizabeth    . CORONARY ARTERY DISEASE 02/27/2007   Qualifier: Diagnosis of  By: Barbaraann Barthelankins MD, [place]    . CVA 02/28/2004   Annotation: embolic Qualifier: Diagnosis of  By: Barbaraann Barthelankins MD, [place]    . Dementia    [facility]  . Diabetes mellitus   . DYSLIPIDEMIA 02/27/2007   Qualifier: Diagnosis of  By: Barbaraann Barthelankins MD, [place]    . FIBRILLATION, ATRIAL 08/31/2002   Qualifier: Diagnosis of  By: Barbaraann Barthelankins MD, [place]    . Grief   . Hypertension   . Neuropathic pain   . Osteoarthritis of left knee   . Physical deconditioning   . Symptomatic bradycardia 08/16/15   [employer] PPM, Dr. Ladona Ridgelaylor    Patient Active Problem List   Diagnosis Date Noted  . Chest pain 12/19/2017  . Chronic diastolic CHF (congestive heart failure) (HCC) 12/19/2017  . CKD (chronic kidney disease) stage 3, GFR 30-59 ml/min (HCC) 12/19/2017  . Dementia 12/19/2017  . CHF exacerbation (HCC) 02/27/2016  . Altered mental state   . Confusion   . Acute GI bleeding   . GI bleed 01/12/2016  . GIB (gastrointestinal bleeding)  01/12/2016  . Symptomatic bradycardia 08/15/2015  . Intraventricular conduction delay 08/15/2015  . S/P aortic valve replacement with bioprosthetic valve 08/15/2015  . Atrial fibrillation with slow ventricular response (HCC) 08/15/2015  . Severe sepsis (HCC) 05/30/2015  . Acute respiratory failure (HCC) 05/30/2015  . History of stroke 03/15/2015  . Benign hypertensive heart disease without heart failure 03/08/2015  . 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 BPH without obstruction/lower urinary tract symptoms 03/08/2015  . Post herpetic neuralgia 03/08/2015  . CAP (community acquired pneumonia) 03/01/2015  . Syncope and collapse 11/12/2013  . Nasal fracture 11/12/2013  . Encephalopathy acute 06/23/2013  . Diabetes mellitus, type 2 (HCC) 06/23/2013  . Hypoglycemia 06/23/2013  . Supratherapeutic INR 06/23/2013  . Zoster 10/12/2011  . Altered mental status 10/08/2011  . GLAUCOMA 09/08/2010  . KNEE PAIN, LEFT 11/22/2009  . 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 CAROTID BRUIT, RIGHT 01/18/2009  . GASTROINTESTINAL HEMORRHAGE 09/26/2007  . OSTEOARTHRITIS 03/18/2007  . Dyslipidemia 02/27/2007  . Coronary atherosclerosis 02/27/2007  . Essential hypertension 02/25/2007  . Cerebral artery occlusion with cerebral infarction (HCC) 02/28/2004  . ASCENDING AORTIC ANEURYSM 02/28/2004  . H/O aortic valve replacement 02/28/2004  . FIBRILLATION, ATRIAL 08/31/2002    Past Surgical History:  Procedure Laterality Date  . CARDIAC SURGERY     bioprosthetic AV replacement  . EP IMPLANTABLE  DEVICE N/A 08/16/2015   [facility] [employer] PPM, Dr. Ladona Ridgel  . ESOPHAGOGASTRODUODENOSCOPY (EGD) WITH PROPOFOL N/A 01/17/2016   Procedure: ESOPHAGOGASTRODUODENOSCOPY (EGD) WITH PROPOFOL;  Surgeon: Bernette Redbird, MD;  Location: [facility] ENDOSCOPY;  Service: Endoscopy;  Laterality: N/A;        Home Medications    Prior to Admission medications    Medication Sig Start Date End Date Taking? Authorizing Provider  acetaminophen (TYLENOL) 500 MG tablet Take 1,000 mg by mouth 3 (three) times daily.   Yes [provider]  aspirin 81 MG chewable tablet Chew 1 tablet (81 mg total) by mouth daily. 02/29/16  Yes Ghimire, Werner Lean, MD  atorvastatin (LIPITOR) 10 MG tablet Take 10 mg by mouth daily.   Yes [provider]  citalopram (CELEXA) 10 MG tablet Take 10 mg by mouth daily.   Yes [provider]  clonazePAM (KLONOPIN) 0.5 MG tablet Take 0.5 mg by mouth 2 (two) times daily.   Yes [provider]  docusate sodium (COLACE) 100 MG capsule Take 100 mg by mouth 2 (two) times daily.   Yes [provider]  furosemide (LASIX) 20 MG tablet Take 2 tablets (40 mg total) by mouth daily. Reported on 08/17/2015 03/01/16  Yes Ghimire, Werner Lean, MD  gabapentin (NEURONTIN) 300 MG capsule Take 300 mg by mouth 2 (two) times daily.   Yes [provider]  glipiZIDE (GLUCOTROL) 5 MG tablet Take 5 mg by mouth 2 (two) times daily.  11/14/13  Yes Buriev, Isaiah Serge, MD  hydrALAZINE (APRESOLINE) 25 MG tablet Take 1 tablet (25 mg total) by mouth 2 (two) times daily. Patient taking differently: Take 25 mg by mouth 3 (three) times daily.  02/29/16  Yes Ghimire, Werner Lean, MD  metFORMIN (GLUCOPHAGE) 1000 MG tablet Take 1,000 mg by mouth every evening.    Yes [provider]  nitroGLYCERIN (NITROSTAT) 0.4 MG SL tablet Place 1 tablet (0.4 mg total) under the tongue every 5 (five) minutes as needed for chest pain (if more than 3 needed, please call your doctor.). 12/21/17  Yes Arrien, York Ram, MD  pantoprazole (PROTONIX) 40 MG tablet Take 1 tablet (40 mg total) by mouth daily. 01/19/16  Yes Richarda Overlie, MD  polyethylene glycol (MIRALAX / GLYCOLAX) packet Take 17 g by mouth daily. Mix with 8 ounces of liquid and drink/Hold for loose stools   Yes [provider]  risperiDONE (RISPERDAL) 0.5 MG tablet Take  0.5 mg by mouth daily. Additional Order: Give 0.25mg  by mouth twice daily as needed for aggitation   Yes [provider]  tamsulosin (FLOMAX) 0.4 MG CAPS capsule Take 0.4 mg by mouth daily.  02/12/15  Yes [provider]  traMADol (ULTRAM) 50 MG tablet Take 50 mg by mouth 2 (two) times daily.   Yes [provider]    Family History Family History  Problem Relation Age of Onset  . Other Mother        old age  . CAD Father   . CAD Brother     Social History Social History   Tobacco Use  . Smoking status: Never Smoker  . Smokeless tobacco: Never Used  Substance Use Topics  . Alcohol use: No  . Drug use: No     Allergies   Patient has no known allergies.   Review of Systems Review of Systems  Unable to perform ROS: Dementia  Gastrointestinal: Positive for diarrhea.     Physical Exam Updated Vital Signs BP (!) 134/103 (BP Location: Left  Arm)   Pulse 84   Temp (!) 97.5 F (36.4 C) (Axillary)   Resp 16   SpO2 98%   Physical Exam  Constitutional: He appears well-developed and well-nourished. No distress.  HENT:  Head: Normocephalic and atraumatic.  Nose: Nose normal.  Eyes: Conjunctivae and EOM are normal. Right eye exhibits no discharge. Left eye exhibits no discharge. No scleral icterus.  Neck: Normal range of motion. Neck supple.  Cardiovascular: Normal rate, regular rhythm, normal heart sounds and intact distal pulses. Exam reveals no gallop and no friction rub.  No murmur heard. Pulmonary/Chest: Effort normal and breath sounds normal. No respiratory distress.  Abdominal: Soft. Bowel sounds are normal. He exhibits no distension. There is no tenderness. There is no guarding.  No abdominal tenderness to palpation.  Musculoskeletal: Normal range of motion. He exhibits no edema.  Neurological: He is alert. He exhibits normal muscle tone. Coordination normal.  Skin: Skin is warm and dry. No rash noted.  Psychiatric: He has a normal mood  and affect.  Nursing note and vitals reviewed.    ED Treatments / Results  Labs (all labs ordered are listed, but only abnormal results are displayed) Labs Reviewed  COMPREHENSIVE METABOLIC PANEL - Abnormal; Notable for the following components:      Result Value   Sodium 162 (*)    Chloride 124 (*)    Glucose, Bld 211 (*)    BUN 85 (*)    Creatinine, Ser 2.43 (*)    Calcium 10.9 (*)    Total Protein 8.2 (*)    AST 49 (*)    Total Bilirubin 1.6 (*)    GFR calc non Af Amer 22 (*)    GFR calc Af Amer 26 (*)    All other components within normal limits  CBC - Abnormal; Notable for the following components:   MCH 25.5 (*)    RDW 20.2 (*)    Platelets 146 (*)    All other components within normal limits  LIPASE, BLOOD  URINALYSIS, ROUTINE W REFLEX MICROSCOPIC    EKG None  Radiology No results found.  Procedures Procedures (including critical care time)  CRITICAL CARE Performed by: Dietrich Pates   Total critical care time: 35 minutes  Critical care time was exclusive of separately billable procedures and treating other patients.  Critical care was necessary to treat or prevent imminent or life-threatening deterioration.  Critical care was time spent personally by me on the following activities: development of treatment plan with patient and/or surrogate as well as nursing, discussions with consultants, evaluation of patient's response to treatment, examination of patient, obtaining history from patient or surrogate, ordering and performing treatments and interventions, ordering and review of laboratory studies, ordering and review of radiographic studies, pulse oximetry and re-evaluation of patient's condition.   Medications Ordered in ED Medications  sodium chloride 0.9 % bolus 1,000 mL (has no administration in time range)  0.9 %  sodium chloride infusion (has no administration in time range)     Initial Impression / Assessment and Plan / ED Course  I have  reviewed the triage vital signs and the nursing notes.  Pertinent labs & imaging results that were available during my care of the patient were reviewed by me and considered in my medical decision making (see chart for details).     85 year old male with past medical history of dementia, diabetes, CAD, hypertension presents for evaluation of diarrhea for 2 days.  Patient speaks [place] only.  Does not participate  in use of the interpreter.  Facility unable to give any more information.  Lab work significant for sodium level of 162, BUN 85 and creatinine of 2.4 which is significantly higher than his baseline.  Patient given fluid bolus and will be admitted to hospitalist service.  Portions of this note were generated with Scientist, clinical (histocompatibility and immunogenetics). Dictation errors may occur despite best attempts at proofreading.   Final Clinical Impressions(s) / ED Diagnoses   Final diagnoses:  None    ED Discharge Orders    None       Dietrich Pates, PA-C 01/20/18 2313    Bethann Berkshire, MD 01/20/18 2324

## 2018-01-20 NOTE — ED Triage Notes (Signed)
Pt arrives via EMS from [facility], per their report staff sent pt  with c/o diarrhea x 2 days. Hx of dementia/alzheimers- speaks [place] language. 132/98, 94, 98% RA.

## 2018-01-20 NOTE — ED Notes (Signed)
ED TO INPATIENT HANDOFF REPORT  Name/Age/Gender Harold Jones 84 y.o. male  Code Status Code Status History    Date Active Date Inactive Code Status Order ID Comments User Context   12/19/2017 1708 12/21/2017 2303 Full Code [id number]  Dessa Phi, DO Inpatient   04/25/2016 0957 04/25/2016 1820 DNR [id number]  Fredia Sorrow, MD ED   02/27/2016 2017 02/29/2016 1905 DNR [id number]  Janece Canterbury, MD Inpatient   01/12/2016 1339 01/19/2016 1946 Full Code [id number]  Tomma Rakers, MD ED   03/01/2015 2324 03/07/2015 2119 Full Code [id number]  Lavina Hamman, MD ED   11/12/2013 1453 11/14/2013 1748 Full Code [id number]  Thurnell Lose, MD Inpatient   06/23/2013 1822 06/25/2013 2201 Full Code [id number]  Cristal Ford, DO Inpatient   10/08/2011 1833 10/12/2011 1630 Full Code [id number]  Lindalou Hose, RN Inpatient      Home/SNF/Other Skilled nursing facility  Chief Complaint diarrhea  Level of Care/Admitting Diagnosis ED Disposition    ED Disposition Condition Comment   Admit  The patient appears reasonably stabilized for admission considering the current resources, flow, and capabilities available in the ED at this time, and I doubt any other [facility] requiring further screening and/or treatment in the ED prior to admission is  present.       Medical History Past Medical History:  Diagnosis Date  . Aortic valve disorder   . BPH without obstruction/lower urinary tract symptoms   . Chest wall pain following surgery   . COGNITIVE DEFICITS DUE CEREBROVASCULAR DISEASE 09/09/2009   Qualifier: Diagnosis of  By: Amil Amen MD, Benjamine Mola    . CORONARY ARTERY DISEASE 02/27/2007   Qualifier: Diagnosis of  By: Radene Ou MD, [place]    . CVA 02/28/2004   Annotation: embolic Qualifier: Diagnosis of  By: Radene Ou MD, [place]    . Dementia    Per paper work from [employer]  . Diabetes mellitus   . DYSLIPIDEMIA 02/27/2007   Qualifier: Diagnosis of  By: Radene Ou MD, [place]    .  FIBRILLATION, ATRIAL 08/31/2002   Qualifier: Diagnosis of  By: Radene Ou MD, [place]    . Grief   . Hypertension   . Neuropathic pain   . Osteoarthritis of left knee   . Physical deconditioning   . Symptomatic bradycardia 08/16/15   [employer] PPM, Dr. Lovena Le    Allergies No Known Allergies  IV Location/Drains/Wounds Patient Lines/Drains/Airways Status   Active Line/Drains/Airways    Name:   Placement date:   Placement time:   Site:   Days:   Peripheral IV 01/20/18 Right Wrist   01/20/18    2308    Wrist   less than 1          Labs/Imaging Results for orders placed or performed during the hospital encounter of 01/20/18 (from the past 48 hour(s))  Lipase, blood     Status: None   Collection Time: 01/20/18  9:27 PM  Result Value Ref Range   Lipase 43 11 - 51 U/L    Comment: Performed at [facility], [place] [address] [place],  [address]  Comprehensive metabolic panel     Status: Abnormal   Collection Time: 01/20/18  9:27 PM  Result Value Ref Range   Sodium 162 (HH) 135 - 145 mmol/L    Comment: CRITICAL RESULT CALLED TO, READ BACK BY AND VERIFIED WITH: Assunta Found RN 2249 01/20/18 A NAVARRO    Potassium 4.3 3.5 - 5.1 mmol/L   Chloride 124 (H) 98 -  111 mmol/L   CO2 24 22 - 32 mmol/L   Glucose, Bld 211 (H) 70 - 99 mg/dL   BUN 85 (H) 8 - 23 mg/dL   Creatinine, Ser 2.43 (H) 0.61 - 1.24 mg/dL   Calcium 10.9 (H) 8.9 - 10.3 mg/dL   Total Protein 8.2 (H) 6.5 - 8.1 g/dL   Albumin 4.4 3.5 - 5.0 g/dL   AST 49 (H) 15 - 41 U/L   ALT 37 0 - 44 U/L   Alkaline Phosphatase 54 38 - 126 U/L   Total Bilirubin 1.6 (H) 0.3 - 1.2 mg/dL   GFR calc non Af Amer 22 (L) >60 mL/min   GFR calc Af Amer 26 (L) >60 mL/min    Comment: (NOTE) The eGFR has been calculated using the CKD EPI equation. This calculation has not been validated in all clinical situations. eGFR's persistently <60 mL/min signify possible Chronic Kidney Disease.    Anion gap 14 5 - 15    Comment:  Performed at [facility], [place] [address], [place], [place] [address]  CBC     Status: Abnormal   Collection Time: 01/20/18  9:27 PM  Result Value Ref Range   WBC 8.2 4.0 - 10.5 K/uL   RBC 5.57 4.22 - 5.81 MIL/uL   Hemoglobin 14.2 13.0 - 17.0 g/dL   HCT 46.3 39.0 - 52.0 %   MCV 83.1 78.0 - 100.0 fL   MCH 25.5 (L) 26.0 - 34.0 pg   MCHC 30.7 30.0 - 36.0 g/dL   RDW 20.2 (H) 11.5 - 15.5 %   Platelets 146 (L) 150 - 400 K/uL    Comment: Performed at [facility], [place] [address], [place], [place] [address]   No results found.  Pending Labs Unresulted Labs (From admission, onward)   Start     Ordered   01/20/18 2023  Urinalysis, Routine w reflex microscopic  STAT,   STAT     01/20/18 2023      Vitals/Pain Today's Vitals   01/20/18 2023 01/20/18 2130 01/20/18 2300  BP: (!) 127/97 (!) 134/103 132/81  Pulse: 96 84 90  Resp: '18 16 17  ' Temp: (!) 97.5 F (36.4 C)    TempSrc: Axillary    SpO2: 97% 98% 96%    Isolation Precautions No active isolations  Medications Medications  sodium chloride 0.9 % bolus 1,000 mL (1,000 mLs Intravenous New Bag/Given 01/20/18 2326)  0.9 %  sodium chloride infusion (has no administration in time range)    Mobility

## 2018-01-20 NOTE — H&P (Signed)
History and Physical    Harold Jones AVW:[id number] DOB: 1928/01/24 DOA: 01/20/2018  PCP: Mortimer Fries, PA  Patient coming from: [facility]  I have personally briefly reviewed patient's old medical records in [facility] Link  Chief Complaint: Chest pain  HPI: Harold Jones is a 84 y.o. male with medical history significant of CAD, dementia, DM2, HTN.  Patient is [place] speaking, unable to interact with the interpreter today though this was tried.  Sent in from SNF for 2 day h/o diarrhea.  Granddaughter whom I spoke with on phone, tells me he doesn't do well with interpreters over the phone and that this actually may, or may not be baseline.  ED Course: Regardless, it becomes apparent that patient is very dehydrated with sodium 162, Cl 124.  AKI with BUN 85, creat 2.4 this on a baseline CKD stage 3 with baseline creat 1.6 it looks like.  No fever, no WBC, no SIRS.   Review of Systems: Unable to perform.  Past Medical History:  Diagnosis Date  . Aortic valve disorder   . BPH without obstruction/lower urinary tract symptoms   . Chest wall pain following surgery   . COGNITIVE DEFICITS DUE CEREBROVASCULAR DISEASE 09/09/2009   Qualifier: Diagnosis of  By: Delrae Alfred MD, Lanora Manis    . CORONARY ARTERY DISEASE 02/27/2007   Qualifier: Diagnosis of  By: Barbaraann Barthel MD, [place]    . CVA 02/28/2004   Annotation: embolic Qualifier: Diagnosis of  By: Barbaraann Barthel MD, [place]    . Dementia    Per paper work from [employer]  . Diabetes mellitus   . DYSLIPIDEMIA 02/27/2007   Qualifier: Diagnosis of  By: Barbaraann Barthel MD, [place]    . FIBRILLATION, ATRIAL 08/31/2002   Qualifier: Diagnosis of  By: Barbaraann Barthel MD, [place]    . Grief   . Hypertension   . Neuropathic pain   . Osteoarthritis of left knee   . Physical deconditioning   . Symptomatic bradycardia 08/16/15   [employer] PPM, Dr. Ladona Ridgel    Past Surgical History:  Procedure Laterality Date  . CARDIAC SURGERY     bioprosthetic  AV replacement  . EP IMPLANTABLE DEVICE N/A 08/16/2015   [facility] Scientific PPM, Dr. Ladona Ridgel  . ESOPHAGOGASTRODUODENOSCOPY (EGD) WITH PROPOFOL N/A 01/17/2016   Procedure: ESOPHAGOGASTRODUODENOSCOPY (EGD) WITH PROPOFOL;  Surgeon: Bernette Redbird, MD;  Location: [facility] ENDOSCOPY;  Service: Endoscopy;  Laterality: N/A;     reports that he has never smoked. He has never used smokeless tobacco. He reports that he does not drink alcohol or use drugs.  No Known Allergies  Family History  Problem Relation Age of Onset  . Other Mother        old age  . CAD Father   . CAD Brother      Prior to Admission medications   Medication Sig Start Date End Date Taking? Authorizing Provider  acetaminophen (TYLENOL) 500 MG tablet Take 1,000 mg by mouth 3 (three) times daily.   Yes [provider]  aspirin 81 MG chewable tablet Chew 1 tablet (81 mg total) by mouth daily. 02/29/16  Yes Ghimire, Werner Lean, MD  atorvastatin (LIPITOR) 10 MG tablet Take 10 mg by mouth daily.   Yes [provider]  citalopram (CELEXA) 10 MG tablet Take 10 mg by mouth daily.   Yes [provider]  clonazePAM (KLONOPIN) 0.5 MG tablet Take 0.5 mg by mouth 2 (two) times daily.   Yes [provider]  docusate sodium (COLACE) 100 MG capsule Take 100 mg  by mouth 2 (two) times daily.   Yes [provider]  furosemide (LASIX) 20 MG tablet Take 2 tablets (40 mg total) by mouth daily. Reported on 08/17/2015 03/01/16  Yes Ghimire, Werner LeanShanker M, MD  gabapentin (NEURONTIN) 300 MG capsule Take 300 mg by mouth 2 (two) times daily.   Yes [provider]  glipiZIDE (GLUCOTROL) 5 MG tablet Take 5 mg by mouth 2 (two) times daily.  11/14/13  Yes Buriev, Isaiah SergeUlugbek N, MD  hydrALAZINE (APRESOLINE) 25 MG tablet Take 1 tablet (25 mg total) by mouth 2 (two) times daily. Patient taking differently: Take 25 mg by mouth 3 (three) times daily.  02/29/16  Yes Ghimire, Werner LeanShanker M, MD  metFORMIN (GLUCOPHAGE) 1000 MG tablet Take  1,000 mg by mouth every evening.    Yes [provider]  nitroGLYCERIN (NITROSTAT) 0.4 MG SL tablet Place 1 tablet (0.4 mg total) under the tongue every 5 (five) minutes as needed for chest pain (if more than 3 needed, please call your doctor.). 12/21/17  Yes Arrien, York RamMauricio Daniel, MD  pantoprazole (PROTONIX) 40 MG tablet Take 1 tablet (40 mg total) by mouth daily. 01/19/16  Yes Richarda OverlieAbrol, Nayana, MD  polyethylene glycol (MIRALAX / GLYCOLAX) packet Take 17 g by mouth daily. Mix with 8 ounces of liquid and drink/Hold for loose stools   Yes [provider]  risperiDONE (RISPERDAL) 0.5 MG tablet Take 0.5 mg by mouth daily. Additional Order: Give 0.25mg  by mouth twice daily as needed for aggitation   Yes [provider]  tamsulosin (FLOMAX) 0.4 MG CAPS capsule Take 0.4 mg by mouth daily.  02/12/15  Yes [provider]  traMADol (ULTRAM) 50 MG tablet Take 50 mg by mouth 2 (two) times daily.   Yes [provider]    Physical Exam: Vitals:   01/20/18 2023 01/20/18 2130 01/20/18 2300  BP: (!) 127/97 (!) 134/103 132/81  Pulse: 96 84 90  Resp: 18 16 17   Temp: (!) 97.5 F (36.4 C)    TempSrc: Axillary    SpO2: 97% 98% 96%    Constitutional: NAD, calm, comfortable Eyes: PERRL, lids and conjunctivae normal ENMT: Mucous membranes are moist. Posterior pharynx clear of any exudate or lesions.Normal dentition.  Neck: normal, supple, no masses, no thyromegaly Respiratory: clear to auscultation bilaterally, no wheezing, no crackles. Normal respiratory effort. No accessory muscle use.  Cardiovascular: Regular rate and rhythm, no murmurs / rubs / gallops. No extremity edema. 2+ pedal pulses. No carotid bruits.  Abdomen: non-tender Musculoskeletal: no clubbing / cyanosis. No joint deformity upper and lower extremities. Good ROM, no contractures. Normal muscle tone.  Skin: no rashes, lesions, ulcers. No induration Neurologic: MAE Psychiatric: Unable to perform due to  AMS and inability to use interpreter   Labs on Admission: I have personally reviewed following labs and imaging studies  CBC: Recent Labs  Lab 01/20/18 2127  WBC 8.2  HGB 14.2  HCT 46.3  MCV 83.1  PLT 146*   Basic Metabolic Panel: Recent Labs  Lab 01/20/18 2127  NA 162*  K 4.3  CL 124*  CO2 24  GLUCOSE 211*  BUN 85*  CREATININE 2.43*  CALCIUM 10.9*   GFR: CrCl cannot be calculated (Unknown ideal weight.). Liver Function Tests: Recent Labs  Lab 01/20/18 2127  AST 49*  ALT 37  ALKPHOS 54  BILITOT 1.6*  PROT 8.2*  ALBUMIN 4.4   Recent Labs  Lab 01/20/18 2127  LIPASE 43   No results for input(s): AMMONIA in the last  168 hours. Coagulation Profile: No results for input(s): INR, PROTIME in the last 168 hours. Cardiac Enzymes: No results for input(s): CKTOTAL, CKMB, CKMBINDEX, TROPONINI in the last 168 hours. BNP (last 3 results) No results for input(s): PROBNP in the last 8760 hours. HbA1C: No results for input(s): HGBA1C in the last 72 hours. CBG: No results for input(s): GLUCAP in the last 168 hours. Lipid Profile: No results for input(s): CHOL, HDL, LDLCALC, TRIG, CHOLHDL, LDLDIRECT in the last 72 hours. Thyroid Function Tests: No results for input(s): TSH, T4TOTAL, FREET4, T3FREE, THYROIDAB in the last 72 hours. Anemia Panel: No results for input(s): VITAMINB12, FOLATE, FERRITIN, TIBC, IRON, RETICCTPCT in the last 72 hours. Urine analysis:    Component Value Date/Time   COLORURINE YELLOW 01/03/2018 2128   APPEARANCEUR CLEAR 01/03/2018 2128   LABSPEC 1.011 01/03/2018 2128   PHURINE 5.0 01/03/2018 2128   GLUCOSEU NEGATIVE 01/03/2018 2128   HGBUR NEGATIVE 01/03/2018 2128   BILIRUBINUR NEGATIVE 01/03/2018 2128   KETONESUR NEGATIVE 01/03/2018 2128   PROTEINUR NEGATIVE 01/03/2018 2128   UROBILINOGEN 0.2 03/01/2015 1940   NITRITE NEGATIVE 01/03/2018 2128   LEUKOCYTESUR NEGATIVE 01/03/2018 2128    Radiological Exams on Admission: No results  found.  EKG: Independently reviewed.  Assessment/Plan Principal Problem:   Hypernatremia Active Problems:   Essential hypertension   Diabetes mellitus, type 2 (HCC)   CKD (chronic kidney disease) stage 3, GFR 30-59 ml/min (HCC)   Dementia   AKI (acute kidney injury) (HCC)   Diarrhea    1. Hypernatremia and dehydration - Due to diarrhea 1. Will treat this first and foremost with rehydration 2. IVF: 1L NS bolus then 100 cc/hr half NS 3. BMP Q6H 4. Will allow patient to take POs as well (no special diet needed at baseline per granddaughter) 2. AKI on CKD stage 3 - 1. UA and urine lytes pending but likely dehydration 2. Follow BMPs with IVF 3. HTN - 1. Continue home BP meds 2. Hold lasix 4. Dementia - 1. chronic, suspect he has acute encephalopathy as well due to hypernatremia, though difficult to tell as noted above. 2. May be able to clarify further when family comes to see him. 5. Diarrhea - 1. ? Infectious 2. C.Diff pending 3. GI pathogen pnl also ordered 4. Benign abd exam. 6. DM2 - 1. Sensitive SSI AC  DVT prophylaxis: Heparin [place] Code Status: Full Code - Per grand daughter, dosent know of a DNR, per paperwork from SNF: Face sheet says "CPR" MAR says "DNR" next to name. Family Communication: Spoke with Granddaughter on phone, she will let her mother know Disposition Plan: SNF after admit Consults called: None Admission status: Admit to inpatient   Hillary Bow DO [facility] Pager [phone] Only works nights!  If 7AM-7PM, please contact the primary day team physician taking care of patient  [email or web address] Password [email or web address]  01/21/2018, 12:20 AM

## 2018-01-21 ENCOUNTER — Other Ambulatory Visit: Payer: Self-pay

## 2018-01-21 DIAGNOSIS — R197 Diarrhea, unspecified: Secondary | ICD-10-CM | POA: Diagnosis present

## 2018-01-21 DIAGNOSIS — Z66 Do not resuscitate: Secondary | ICD-10-CM | POA: Diagnosis present

## 2018-01-21 DIAGNOSIS — I4891 Unspecified atrial fibrillation: Secondary | ICD-10-CM | POA: Diagnosis present

## 2018-01-21 DIAGNOSIS — E440 Moderate protein-calorie malnutrition: Secondary | ICD-10-CM | POA: Diagnosis present

## 2018-01-21 DIAGNOSIS — Z79899 Other long term (current) drug therapy: Secondary | ICD-10-CM | POA: Diagnosis not present

## 2018-01-21 DIAGNOSIS — F329 Major depressive disorder, single episode, unspecified: Secondary | ICD-10-CM | POA: Diagnosis present

## 2018-01-21 DIAGNOSIS — Z515 Encounter for palliative care: Secondary | ICD-10-CM | POA: Diagnosis present

## 2018-01-21 DIAGNOSIS — G9340 Encephalopathy, unspecified: Secondary | ICD-10-CM | POA: Diagnosis present

## 2018-01-21 DIAGNOSIS — Z8249 Family history of ischemic heart disease and other diseases of the circulatory system: Secondary | ICD-10-CM | POA: Diagnosis not present

## 2018-01-21 DIAGNOSIS — Z681 Body mass index (BMI) 19 or less, adult: Secondary | ICD-10-CM | POA: Diagnosis not present

## 2018-01-21 DIAGNOSIS — E870 Hyperosmolality and hypernatremia: Secondary | ICD-10-CM | POA: Diagnosis present

## 2018-01-21 DIAGNOSIS — N183 Chronic kidney disease, stage 3 (moderate): Secondary | ICD-10-CM | POA: Diagnosis present

## 2018-01-21 DIAGNOSIS — F0390 Unspecified dementia without behavioral disturbance: Secondary | ICD-10-CM | POA: Diagnosis present

## 2018-01-21 DIAGNOSIS — F0391 Unspecified dementia with behavioral disturbance: Secondary | ICD-10-CM | POA: Diagnosis not present

## 2018-01-21 DIAGNOSIS — E1122 Type 2 diabetes mellitus with diabetic chronic kidney disease: Secondary | ICD-10-CM | POA: Diagnosis present

## 2018-01-21 DIAGNOSIS — F419 Anxiety disorder, unspecified: Secondary | ICD-10-CM | POA: Diagnosis present

## 2018-01-21 DIAGNOSIS — Z8673 Personal history of transient ischemic attack (TIA), and cerebral infarction without residual deficits: Secondary | ICD-10-CM | POA: Diagnosis not present

## 2018-01-21 DIAGNOSIS — Z7984 Long term (current) use of oral hypoglycemic drugs: Secondary | ICD-10-CM | POA: Diagnosis not present

## 2018-01-21 DIAGNOSIS — K219 Gastro-esophageal reflux disease without esophagitis: Secondary | ICD-10-CM | POA: Diagnosis present

## 2018-01-21 DIAGNOSIS — N179 Acute kidney failure, unspecified: Secondary | ICD-10-CM | POA: Diagnosis present

## 2018-01-21 DIAGNOSIS — I2510 Atherosclerotic heart disease of native coronary artery without angina pectoris: Secondary | ICD-10-CM | POA: Diagnosis present

## 2018-01-21 DIAGNOSIS — E860 Dehydration: Secondary | ICD-10-CM | POA: Diagnosis present

## 2018-01-21 DIAGNOSIS — I5032 Chronic diastolic (congestive) heart failure: Secondary | ICD-10-CM | POA: Diagnosis present

## 2018-01-21 DIAGNOSIS — Z7982 Long term (current) use of aspirin: Secondary | ICD-10-CM | POA: Diagnosis not present

## 2018-01-21 DIAGNOSIS — I130 Hypertensive heart and chronic kidney disease with heart failure and stage 1 through stage 4 chronic kidney disease, or unspecified chronic kidney disease: Secondary | ICD-10-CM | POA: Diagnosis present

## 2018-01-21 LAB — BASIC METABOLIC PANEL
ANION GAP: 12 (ref 5–15)
ANION GAP: 11 (ref 5–15)
ANION GAP: 11 (ref 5–15)
BUN: 86 mg/dL — AB (ref 8–23)
BUN: 85 mg/dL — ABNORMAL HIGH (ref 8–23)
BUN: 81 mg/dL — AB (ref 8–23)
CALCIUM: 9.9 mg/dL (ref 8.9–10.3)
CHLORIDE: 124 mmol/L — AB (ref 98–111)
CHLORIDE: 127 mmol/L — AB (ref 98–111)
CHLORIDE: 128 mmol/L — AB (ref 98–111)
CO2: 25 mmol/L (ref 22–32)
CO2: 23 mmol/L (ref 22–32)
CO2: 24 mmol/L (ref 22–32)
CREATININE: 2.12 mg/dL — AB (ref 0.61–1.24)
Calcium: 10.2 mg/dL (ref 8.9–10.3)
Calcium: 9.9 mg/dL (ref 8.9–10.3)
Creatinine, Ser: 2.39 mg/dL — ABNORMAL HIGH (ref 0.61–1.24)
Creatinine, Ser: 2.3 mg/dL — ABNORMAL HIGH (ref 0.61–1.24)
GFR calc Af Amer: 26 mL/min — ABNORMAL LOW (ref >60)
GFR calc Af Amer: 28 mL/min — ABNORMAL LOW (ref >60)
GFR calc Af Amer: 30 mL/min — ABNORMAL LOW (ref >60)
GFR calc non Af Amer: 26 mL/min — ABNORMAL LOW (ref >60)
GFR, EST NON AFRICAN AMERICAN: 23 mL/min — AB (ref >60)
GFR, EST NON AFRICAN AMERICAN: 24 mL/min — AB (ref >60)
GLUCOSE: 168 mg/dL — AB (ref 70–99)
GLUCOSE: 164 mg/dL — AB (ref 70–99)
Glucose, Bld: 143 mg/dL — ABNORMAL HIGH (ref 70–99)
POTASSIUM: 4.1 mmol/L (ref 3.5–5.1)
Potassium: 4.3 mmol/L (ref 3.5–5.1)
Potassium: 3.8 mmol/L (ref 3.5–5.1)
SODIUM: 161 mmol/L — AB (ref 135–145)
SODIUM: 163 mmol/L — AB (ref 135–145)
Sodium: 161 mmol/L (ref 135–145)

## 2018-01-21 LAB — URINALYSIS, ROUTINE W REFLEX MICROSCOPIC
Bilirubin Urine: NEGATIVE
Glucose, UA: NEGATIVE mg/dL
KETONES UR: 5 mg/dL — AB
Leukocytes, UA: NEGATIVE
Nitrite: NEGATIVE
PH: 5 (ref 5.0–8.0)
Protein, ur: 30 mg/dL — AB
SPECIFIC GRAVITY, URINE: 1.019 (ref 1.005–1.030)

## 2018-01-21 LAB — OSMOLALITY, URINE: OSMOLALITY UR: 626 mosm/kg (ref 300–900)

## 2018-01-21 LAB — TROPONIN I
TROPONIN I: 0.25 ng/mL — AB (ref <0.03)
Troponin I: 0.28 ng/mL (ref <0.03)

## 2018-01-21 LAB — MRSA PCR SCREENING: MRSA BY PCR: NEGATIVE

## 2018-01-21 LAB — GLUCOSE, CAPILLARY
Glucose-Capillary: 157 mg/dL — ABNORMAL HIGH (ref 70–99)
Glucose-Capillary: 125 mg/dL — ABNORMAL HIGH (ref 70–99)
Glucose-Capillary: 124 mg/dL — ABNORMAL HIGH (ref 70–99)
Glucose-Capillary: 107 mg/dL — ABNORMAL HIGH (ref 70–99)

## 2018-01-21 LAB — SODIUM, URINE, RANDOM: Sodium, Ur: <10 mmol/L

## 2018-01-21 MED ORDER — ONDANSETRON HCL 4 MG PO TABS: 4.0000 mg | ORAL_TABLET | Freq: Four times a day (QID) | ORAL | Status: DC | PRN

## 2018-01-21 MED ORDER — HEPARIN SODIUM (PORCINE) 5000 UNIT/ML IJ SOLN
5000.0000 [IU] | Freq: Three times a day (TID) | INTRAMUSCULAR | Status: DC
  Administered 2018-01-21 – 2018-01-22 (×6): 5000 [IU] via SUBCUTANEOUS
  Filled 2018-01-21 (×6): qty 1

## 2018-01-21 MED ORDER — ASPIRIN 81 MG PO CHEW
81.0000 mg | CHEWABLE_TABLET | Freq: Every day | ORAL | Status: DC
  Filled 2018-01-21 (×2): qty 1

## 2018-01-21 MED ORDER — ACETAMINOPHEN 325 MG PO TABS
650.0000 mg | ORAL_TABLET | Freq: Four times a day (QID) | ORAL | Status: DC | PRN
Start: 2018-01-20 — End: 2018-01-21

## 2018-01-21 MED ORDER — RISPERIDONE 0.25 MG PO TABS
0.2500 mg | ORAL_TABLET | Freq: Two times a day (BID) | ORAL | Status: DC | PRN
  Filled 2018-01-21: qty 1

## 2018-01-21 MED ORDER — CITALOPRAM HYDROBROMIDE 10 MG PO TABS
10.0000 mg | ORAL_TABLET | Freq: Every day | ORAL | Status: DC
  Filled 2018-01-21 (×2): qty 1

## 2018-01-21 MED ORDER — TRAMADOL HCL 50 MG PO TABS
50.0000 mg | ORAL_TABLET | Freq: Two times a day (BID) | ORAL | Status: DC
  Administered 2018-01-21: 50 mg via ORAL
  Filled 2018-01-21 (×3): qty 1

## 2018-01-21 MED ORDER — ACETAMINOPHEN 650 MG RE SUPP: 650.0000 mg | Freq: Four times a day (QID) | RECTAL | Status: DC | PRN

## 2018-01-21 MED ORDER — SODIUM CHLORIDE 0.45 % IV SOLN
INTRAVENOUS | Status: DC
  Administered 2018-01-21 (×2): via INTRAVENOUS

## 2018-01-21 MED ORDER — CLONAZEPAM 0.5 MG PO TABS
0.5000 mg | ORAL_TABLET | Freq: Two times a day (BID) | ORAL | Status: DC
  Administered 2018-01-21 (×2): 0.5 mg via ORAL
  Filled 2018-01-21 (×3): qty 1

## 2018-01-21 MED ORDER — ADULT MULTIVITAMIN W/MINERALS CH
1.0000 | ORAL_TABLET | Freq: Every day | ORAL | Status: DC
  Administered 2018-01-21: 1 via ORAL
  Filled 2018-01-21 (×2): qty 1

## 2018-01-21 MED ORDER — TAMSULOSIN HCL 0.4 MG PO CAPS
0.4000 mg | ORAL_CAPSULE | Freq: Every day | ORAL | Status: DC
  Filled 2018-01-21 (×2): qty 1

## 2018-01-21 MED ORDER — ONDANSETRON HCL 4 MG/2ML IJ SOLN
4.0000 mg | Freq: Four times a day (QID) | INTRAMUSCULAR | Status: DC | PRN
Start: 2018-01-20 — End: 2018-01-22

## 2018-01-21 MED ORDER — HYDRALAZINE HCL 20 MG/ML IJ SOLN
5.0000 mg | Freq: Once | INTRAMUSCULAR | Status: AC
  Administered 2018-01-21: 5 mg via INTRAVENOUS

## 2018-01-21 MED ORDER — HYDRALAZINE HCL 20 MG/ML IJ SOLN
5.0000 mg | INTRAMUSCULAR | Status: DC | PRN
  Filled 2018-01-21: qty 1

## 2018-01-21 MED ORDER — HYDRALAZINE HCL 25 MG PO TABS
25.0000 mg | ORAL_TABLET | Freq: Three times a day (TID) | ORAL | Status: DC
  Filled 2018-01-21 (×3): qty 1

## 2018-01-21 MED ORDER — ENSURE ENLIVE PO LIQD
237.0000 mL | Freq: Three times a day (TID) | ORAL | Status: DC
  Administered 2018-01-21: 237 mL via ORAL

## 2018-01-21 MED ORDER — SODIUM CHLORIDE 0.9 % IV SOLN
INTRAVENOUS | Status: DC
  Administered 2018-01-21 (×2): via INTRAVENOUS

## 2018-01-21 MED ORDER — ACETAMINOPHEN 500 MG PO TABS
1000.0000 mg | ORAL_TABLET | Freq: Three times a day (TID) | ORAL | Status: DC
  Filled 2018-01-21: qty 2

## 2018-01-21 MED ORDER — ATORVASTATIN CALCIUM 10 MG PO TABS: 10.0000 mg | ORAL_TABLET | Freq: Every day | ORAL | Status: DC

## 2018-01-21 MED ORDER — GABAPENTIN 300 MG PO CAPS
300.0000 mg | ORAL_CAPSULE | Freq: Two times a day (BID) | ORAL | Status: DC
  Filled 2018-01-21 (×3): qty 1

## 2018-01-21 MED ORDER — RISPERIDONE 0.5 MG PO TABS
0.5000 mg | ORAL_TABLET | Freq: Every day | ORAL | Status: DC
  Administered 2018-01-21: 0.5 mg via ORAL
  Filled 2018-01-21 (×3): qty 2
  Filled 2018-01-21 (×2): qty 1

## 2018-01-21 MED ORDER — PANTOPRAZOLE SODIUM 40 MG PO TBEC
40.0000 mg | DELAYED_RELEASE_TABLET | Freq: Every day | ORAL | Status: DC
  Filled 2018-01-21 (×2): qty 1

## 2018-01-21 MED ORDER — ENOXAPARIN SODIUM 40 MG/0.4ML ~~LOC~~ SOLN: 40.0000 mg | SUBCUTANEOUS | Status: DC

## 2018-01-21 MED ORDER — HALOPERIDOL LACTATE 5 MG/ML IJ SOLN
2.0000 mg | Freq: Four times a day (QID) | INTRAMUSCULAR | Status: DC | PRN
  Administered 2018-01-21 – 2018-01-22 (×3): 2 mg via INTRAVENOUS
  Filled 2018-01-21 (×3): qty 1

## 2018-01-21 NOTE — Progress Notes (Signed)
Noralee StainJennifer Choi, DO was paged at 1222 regarding pt's critical lab value of sodium 161, pt's increased agitation, and refusal of morning medications. Pt has began to swing at myself and is trying to climb out of bed. Bed alarm is on, mats on placed on the floor surrounding the bed. Will continue to monitor pt.

## 2018-01-21 NOTE — Progress Notes (Signed)
Lab notified me of a critical lab value for Troponin 0.25. Harold StainJennifer Choi, DO was paged and returned my call. She ordered IV Haldol for agitation and advised to continue to monitor the trend of the Troponin levels.

## 2018-01-21 NOTE — Progress Notes (Addendum)
PROGRESS NOTE    Harold Jones  RUE:[id number] DOB: 11-12-1927 DOA: 01/20/2018 PCP: Mortimer Fries, PA     Brief Narrative:  Harold Jones is a 84 yo male with past medical hx of dementia, CAD, DM, HTN. He is [place] speaking. He was sent from SNF to the hospital due to 2 day history of diarrhea.  In the emergency department, he was seen to be dehydrated with hypernatremia, acute kidney injury.  He was admitted for IV fluid resuscitation.  New events last 24 hours / Subjective: Patient was examined at bedside with [place] speaking pharmacist.  Pharmacist was able to translate.  Patient seemed very confused despite in-person translater.  He continued to talk about things that he saw on the TV, asked about if certain people are still alive or if we knew them.  He was not oriented.  He did not answer any questions appropriately.  He did not follow any commands appropriately.  He did complain of chest pain but unable to qualify his complaint.  Review of system was unable to be obtained due to his dementia.  Assessment & Plan:   Principal Problem:   Hypernatremia Active Problems:   Essential hypertension   Diabetes mellitus, type 2 (HCC)   CKD (chronic kidney disease) stage 3, GFR 30-59 ml/min (HCC)   Dementia   AKI (acute kidney injury) (HCC)   Diarrhea   Hypernatremia -Secondary to dehydration -Continue IV fluids -Trend BMP   Acute kidney injury -Baseline creatinine 1.6-1.7 -Continue IV fluids and monitor BMP   Chest pain -Unable to describe pain, but does rub his chest on exam. Chest pain seems to be reproductible on exam, but difficult to tell -Check trop, echo -EKG reviewed independently, A Fib, left axis deviation, T wave inv in leads I and avL, no significant change from previous   Diarrhea -GI panel pending  Dementia -Supportive care -Continue risperdal   Essential hypertension -Hold Lasix in setting of hypernatremia and dehydration -Continue hydralazine    Diabetes -Sliding scale insulin  CAD -Continue aspirin, lipitor  Depression/anxiety -Continue celexa, klonopin   GERD -Continue PPI    DVT prophylaxis: Subcu heparin Code Status: DNR  Per family request Family Communication: No family at bedside  Disposition Plan: Back to skilled nursing facility once stable   Consultants:   None   Procedures:   None  Antimicrobials:  Anti-infectives (From admission, onward)   None        Objective: Vitals:   01/21/18 0120 01/21/18 0300 01/21/18 0653 01/21/18 1118  BP: (!) 133/92  (!) 123/97 129/84  Pulse: 89  (!) 107 89  Resp: 16     Temp: 97.7 F (36.5 C)     TempSrc: Oral     SpO2: 98%  100%   Weight: 67.9 kg (149 lb 11.2 oz) 63.1 kg (139 lb 3.2 oz)    Height: 6\' 1"  (1.854 m) 6\' 1"  (1.854 m)      Intake/Output Summary (Last 24 hours) at 01/21/2018 1141 Last data filed at 01/21/2018 1000 Gross per 24 hour  Intake 535 ml  Output 100 ml  Net 435 ml   Filed Weights   01/21/18 0120 01/21/18 0300  Weight: 67.9 kg (149 lb 11.2 oz) 63.1 kg (139 lb 3.2 oz)    Examination:  General exam: Appears calm and comfortable  Respiratory system: Clear to auscultation. Respiratory effort normal. Cardiovascular system: S1 & S2 heard, RRR. No JVD, murmurs, rubs, gallops or clicks. No pedal edema. Gastrointestinal system: Abdomen is nondistended,  soft and nontender. No organomegaly or masses felt. Normal bowel sounds heard. Central nervous system: Alert, does not follow commands appropriately but no focal deficits seen on exam  Extremities: Symmetric 5 x 5 power. Skin: No rashes, lesions or ulcers Psychiatry: Advanced dementia   Data Reviewed: I have personally reviewed following labs and imaging studies  CBC: Recent Labs  Lab 01/20/18 2127  WBC 8.2  HGB 14.2  HCT 46.3  MCV 83.1  PLT 146*   Basic Metabolic Panel: Recent Labs  Lab 01/20/18 2127 01/21/18 0537  NA 162* 161*  K 4.3 4.3  CL 124* 124*  CO2 24 25   GLUCOSE 211* 168*  BUN 85* 86*  CREATININE 2.43* 2.39*  CALCIUM 10.9* 10.2   GFR: Estimated Creatinine Clearance: 19.1 mL/min (A) (by C-G formula based on SCr of 2.39 mg/dL (H)). Liver Function Tests: Recent Labs  Lab 01/20/18 2127  AST 49*  ALT 37  ALKPHOS 54  BILITOT 1.6*  PROT 8.2*  ALBUMIN 4.4   Recent Labs  Lab 01/20/18 2127  LIPASE 43   No results for input(s): AMMONIA in the last 168 hours. Coagulation Profile: No results for input(s): INR, PROTIME in the last 168 hours. Cardiac Enzymes: No results for input(s): CKTOTAL, CKMB, CKMBINDEX, TROPONINI in the last 168 hours. BNP (last 3 results) No results for input(s): PROBNP in the last 8760 hours. HbA1C: No results for input(s): HGBA1C in the last 72 hours. CBG: Recent Labs  Lab 01/21/18 0746  GLUCAP 157*   Lipid Profile: No results for input(s): CHOL, HDL, LDLCALC, TRIG, CHOLHDL, LDLDIRECT in the last 72 hours. Thyroid Function Tests: No results for input(s): TSH, T4TOTAL, FREET4, T3FREE, THYROIDAB in the last 72 hours. Anemia Panel: No results for input(s): VITAMINB12, FOLATE, FERRITIN, TIBC, IRON, RETICCTPCT in the last 72 hours. Sepsis Labs: No results for input(s): PROCALCITON, LATICACIDVEN in the last 168 hours.  No results found for this or any previous visit (from the past 240 hour(s)).     Radiology Studies: No results found.    Scheduled Meds: . acetaminophen  1,000 mg Oral TID  . aspirin  81 mg Oral Daily  . atorvastatin  10 mg Oral q1800  . citalopram  10 mg Oral Daily  . clonazePAM  0.5 mg Oral BID  . gabapentin  300 mg Oral BID  . heparin  5,000 Units Subcutaneous Q8H  . hydrALAZINE  25 mg Oral TID  . insulin aspart  0-9 Units Subcutaneous TID WC  . pantoprazole  40 mg Oral Daily  . risperiDONE  0.5 mg Oral Daily  . tamsulosin  0.4 mg Oral Daily  . traMADol  50 mg Oral BID   Continuous Infusions: . sodium chloride 100 mL/hr at 01/21/18 1124     LOS: 0 days    Time  spent: 35 minutes   Noralee StainJennifer Shaneese Tait, DO [facility] [email or web address] Password [email or web address] 01/21/2018, 11:41 AM

## 2018-01-21 NOTE — Progress Notes (Signed)
I was notified by lab of the critical lab value for sodium 163. Noralee StainJennifer Choi, DO notified/paged.

## 2018-01-21 NOTE — Progress Notes (Signed)
EKG complete- abnormal result, Noralee StainJennifer Choi, DO paged/notified.

## 2018-01-21 NOTE — Progress Notes (Signed)
At 1645 pt gave verbal permission via video interpretor for hospital staff to speak to his family anytime regarding his health and current condition. His Daughter Aniceto BossGordana Kubiesak requestion the pt (her Father) be DNR. I paged the Noralee StainJennifer Choi, DO regarding DNR request. The conversation was translated via video interpretor. DNR was explained to the Daughter and the Daughter confirmed her understanding and still requested for the pt to be DNR. Noralee StainJennifer Choi, DO verbalized over the phone with me that she is putting in the orders for the pt to be a DNR. She offered to set up a time for the Daughter to meet with a hospitalist face-to-face tomorrow for any additional question or concerns questions, but the pt's Daughter declined. The pt's Daughter reports no further questions or concerns at this time.   In addition, I asked the interpreter to ask the pt if he was in pain or if he was having chest pain, but the pt did not respond. The Daughter informed me that her Father has been declining over the past two years since her Mother died. She said he has been refusing medications, barely eating and drinking, and his dementia has worsened. Pt continues to refuse meds and has a poor appetite during this shift. I will continue to try to medicate and encourage/assist the pt with eating and drinking.

## 2018-01-21 NOTE — Progress Notes (Signed)
Initial Nutrition Assessment  DOCUMENTATION CODES:   Non-severe (moderate) malnutrition in context of chronic illness, Underweight  INTERVENTION:  - Will order Ensure Enlive BID, each supplement provides 350 kcal and 20 grams of protein. - Will order daily multivitamin with minerals.  - Tech/RN to assist with feeding, if needed. - Continue to encourage PO intakes.   NUTRITION DIAGNOSIS:   Moderate Malnutrition related to chronic illness(dementia) as evidenced by mild fat depletion, mild muscle depletion, moderate muscle depletion, percent weight loss.  GOAL:   Patient will meet greater than or equal to 90% of their needs  MONITOR:   PO intake, Supplement acceptance, Weight trends, Labs, I & O's  REASON FOR ASSESSMENT:   Malnutrition Screening Tool  ASSESSMENT:   84 yo male with past medical hx of dementia, CAD, DM, HTN. He is [place] speaking. He was sent from SNF to the hospital due to 2 day history of diarrhea.  In the emergency department, he was seen to be dehydrated with hypernatremia, acute kidney injury.  He was admitted for IV fluid resuscitation.  Per chart review, patient consumed 5% of breakfast. Breakfast tray in the room and it appeared that patient had taken a few sips of orange juice and milk and eaten a few bites of grits.  Noted that patient speaks [place]; no family/visitors at bedside. Patient with hx of dementia and flow sheet indicates that he is: impulsive, has poor judgment, has memory impairment, is unable to follow commands, and has poor safety awareness.  Spoke with tech who assisted patient with breakfast. She allowed patient to self feed for the meal and states that he did well with liquids and has been sipping on water throughout the day but that he did not want solid food. She had ordered lunch and plans to sit with patient and feed him lunch to assess if intakes improve.  Spoke with RN who reports that patient did not take morning medications and  that he was combative/attempting to hit her and was attempting to climb out of bed. She briefly spoke with patient's daughter over the phone to give her an update on patient.   No additional information able to be obtained at this time. NFPE outlined below. Per chart review, patient has lost 40 lbs (22.3% body weight) in the past 1 month. Unsure of how much of this is body mass versus fluid losses with diarrhea and documented severe dehydration. Will continue to monitor weight trends closely.     Medications reviewed; sliding scale Novolog, 40 mg oral Protonix/day. Labs reviewed; CBGs: 157 and 125 mg/dL today, Na: 161161 mmol/L, Cl; 127 mmol/L, BUN: 85 mg/dL, creatinine: 2.3 mg/dL, GFR: 24 mL/min.   IVF: 1/2 NS @ 100 ml/hr.      NUTRITION - FOCUSED PHYSICAL EXAM:    Most Recent Value  Orbital Region  No depletion  Upper Arm Region  Mild depletion  Thoracic and Lumbar Region  Unable to assess  Buccal Region  Unable to assess  Temple Region  No depletion  Clavicle Bone Region  Moderate depletion  Clavicle and Acromion Bone Region  Moderate depletion  Scapular Bone Region  Unable to assess  Dorsal Hand  No depletion  Patellar Region  Mild depletion  Anterior Thigh Region  Unable to assess  Posterior Calf Region  Moderate depletion  Edema (RD Assessment)  None  Hair  Reviewed  Eyes  Reviewed  Mouth  Unable to assess  Skin  Reviewed  Nails  Reviewed  Diet Order:   Diet Order           Diet Carb Modified Fluid consistency: Thin; Room service appropriate? Yes  Diet effective now          EDUCATION NEEDS:   No education needs have been identified at this time  Skin:  Skin Assessment: Reviewed RN Assessment  Last BM:  7/22  Height:   Ht Readings from Last 1 Encounters:  01/21/18 6\' 1"  (1.854 m)    Weight:   Wt Readings from Last 1 Encounters:  01/21/18 139 lb 3.2 oz (63.1 kg)    Ideal Body Weight:  83.64 kg  BMI:  Body mass index is 18.37  kg/m.  Estimated Nutritional Needs:   Kcal:  1770-2020 (28-32 kcal/kg)  Protein:  76-88 grams (1.2-1.4 grams/kg)  Fluid:  >/= 2.5 L/day      Trenton Gammon, MS, RD, LDN, [facility] Inpatient Clinical Dietitian Pager # [phone] After hours/weekend pager # [phone]

## 2018-01-21 NOTE — Plan of Care (Signed)
Plan of care discussed. Patient desired to rest.

## 2018-01-22 ENCOUNTER — Other Ambulatory Visit (HOSPITAL_COMMUNITY): Payer: Medicare Other

## 2018-01-22 DIAGNOSIS — N179 Acute kidney failure, unspecified: Secondary | ICD-10-CM

## 2018-01-22 DIAGNOSIS — F0391 Unspecified dementia with behavioral disturbance: Secondary | ICD-10-CM

## 2018-01-22 DIAGNOSIS — E870 Hyperosmolality and hypernatremia: Secondary | ICD-10-CM

## 2018-01-22 LAB — BASIC METABOLIC PANEL
BUN: 75 mg/dL — AB (ref 8–23)
BUN: 68 mg/dL — AB (ref 8–23)
CO2: 22 mmol/L (ref 22–32)
CO2: 23 mmol/L (ref 22–32)
CREATININE: 1.84 mg/dL — AB (ref 0.61–1.24)
Calcium: 9.9 mg/dL (ref 8.9–10.3)
Calcium: 9.9 mg/dL (ref 8.9–10.3)
Chloride: >130 mmol/L (ref 98–111)
Creatinine, Ser: 1.99 mg/dL — ABNORMAL HIGH (ref 0.61–1.24)
GFR calc Af Amer: 33 mL/min — ABNORMAL LOW (ref >60)
GFR calc Af Amer: 36 mL/min — ABNORMAL LOW (ref >60)
GFR calc non Af Amer: 31 mL/min — ABNORMAL LOW (ref >60)
GFR, EST NON AFRICAN AMERICAN: 28 mL/min — AB (ref >60)
GLUCOSE: 145 mg/dL — AB (ref 70–99)
Glucose, Bld: 113 mg/dL — ABNORMAL HIGH (ref 70–99)
POTASSIUM: 4.1 mmol/L (ref 3.5–5.1)
Potassium: 4.1 mmol/L (ref 3.5–5.1)
SODIUM: 165 mmol/L — AB (ref 135–145)
Sodium: 165 mmol/L (ref 135–145)

## 2018-01-22 LAB — GLUCOSE, CAPILLARY
Glucose-Capillary: 138 mg/dL — ABNORMAL HIGH (ref 70–99)
Glucose-Capillary: 163 mg/dL — ABNORMAL HIGH (ref 70–99)
Glucose-Capillary: 129 mg/dL — ABNORMAL HIGH (ref 70–99)

## 2018-01-22 LAB — CBC
HEMATOCRIT: 40.2 % (ref 39.0–52.0)
Hemoglobin: 12.2 g/dL — ABNORMAL LOW (ref 13.0–17.0)
MCH: 25.4 pg — AB (ref 26.0–34.0)
MCHC: 30.3 g/dL (ref 30.0–36.0)
MCV: 83.8 fL (ref 78.0–100.0)
PLATELETS: 127 10*3/uL — AB (ref 150–400)
RBC: 4.8 MIL/uL (ref 4.22–5.81)
RDW: 20.5 % — AB (ref 11.5–15.5)
WBC: 6.9 10*3/uL (ref 4.0–10.5)

## 2018-01-22 LAB — TROPONIN I: TROPONIN I: 0.27 ng/mL — AB (ref <0.03)

## 2018-01-22 MED ORDER — DEXTROSE 5 % IV SOLN
INTRAVENOUS | Status: DC
  Administered 2018-01-22: 08:00:00 via INTRAVENOUS

## 2018-01-22 MED ORDER — MORPHINE SULFATE (PF) 2 MG/ML IV SOLN
2.0000 mg | Freq: Once | INTRAVENOUS | Status: AC
  Administered 2018-01-22: 2 mg via INTRAVENOUS
  Filled 2018-01-22: qty 1

## 2018-01-22 NOTE — Consult Note (Signed)
Consultation Note Date: 01/22/2018   Patient Name: Harold Jones  DOB: [birth year]  MRN: [id number]  Age / Sex: 84 y.o., male  PCP: Mortimer Fries, [place] Referring Physician: Randel Pigg, Dorma Russell, MD  Reason for Consultation: Establishing goals of care  HPI/Patient Profile: 84 y.o. male admitted on 01/20/2018   Clinical Assessment and Goals of Care:  84 yo gentleman from Assisted Living Facility, history of dementia, CAD DM HTN. He is from [place]. He has been admitted with dehydration,hypernatremia, AKI, encephalopathy.  A palliative consult has been placed for goals of care discussions.   Chart reviewed, it is noted that the patient's family had discussions with primary service and code status was changed to DNR DNI in this hospitalization.   The patient is an elderly gentleman resting in bed, he is in no distress, he opens his eyes when his name is called, he doesn't interact or follow commands. There is no family in the room, patient has not shown any interest in his breakfast, save for a few spoons of applesauce.   Call placed and discussed with his grand daughter Harold Jones at [phone]. She is the family's spokesperson, she speaks [place]. I introduced myself and palliative care as follows:  Palliative medicine is specialized medical care for people living with serious illness. It focuses on providing relief from the symptoms and stress of a serious illness. The goal is to improve quality of life for both the patient and the family.  Patient's grand daughter states that for the past few weeks-months, the patient has had gradual progressive decline, both functionally and cognitively. He has had trouble moving, he has been eating less. He has been refusing medication, he has complained of being in pain. He has had several ED visits.   We reviewed his current hospitalization course. We discussed about his  acute and underlying conditions. Discussed with her about comfort measures and hospice philosophy of care. Differences between SNF rehab and residential hospice discussed in detail.   Grand daughter will discuss further with the patient's daughter, how ever, she has asked for our CSW to initiate hospice referral, will request CSW to further discuss with family.  See additional recommendations below, thank you for the consult.   [facility]IN  has a daughter who is his primary Management consultant.  Grand daughter Harold Jones speaks English and is the spokesperson for the family.   SUMMARY OF RECOMMENDATIONS   DNR DNI Focus on comfort measures Comfort feeds as tolerated Consider residential hospice CSW consulted to additionally discuss with family and help facilitate transfer to hospice Thank you for the consult.   Code Status/Advance Care Planning:  DNR    Symptom Management:    continue current mode of care.   Palliative Prophylaxis:  Delirium Protocol   Psycho-social/Spiritual:   Desire for further Chaplaincy support:yes  Additional Recommendations: Education on Hospice  Prognosis:   < 2 weeks  Discharge Planning: Hospice facility      Primary Diagnoses: Present on Admission: . AKI (acute kidney injury) (  HCC) . CKD (chronic kidney disease) stage 3, GFR 30-59 ml/min (HCC) . Hypernatremia . Essential hypertension . Dementia . Diarrhea   I have reviewed the medical record, interviewed the patient and family, and examined the patient. The following aspects are pertinent.  Past Medical History:  Diagnosis Date  . Aortic valve disorder   . BPH without obstruction/lower urinary tract symptoms   . Chest wall pain following surgery   . COGNITIVE DEFICITS DUE CEREBROVASCULAR DISEASE 09/09/2009   Qualifier: Diagnosis of  By: Delrae Alfred MD, Lanora Manis    . CORONARY ARTERY DISEASE 02/27/2007   Qualifier: Diagnosis of  By: Barbaraann Barthel MD, [place]    . CVA 02/28/2004    Annotation: embolic Qualifier: Diagnosis of  By: Barbaraann Barthel MD, [place]    . Dementia    Per paper work from [employer]  . Diabetes mellitus   . DYSLIPIDEMIA 02/27/2007   Qualifier: Diagnosis of  By: Barbaraann Barthel MD, [place]    . FIBRILLATION, ATRIAL 08/31/2002   Qualifier: Diagnosis of  By: Barbaraann Barthel MD, [place]    . Grief   . Hypertension   . Neuropathic pain   . Osteoarthritis of left knee   . Physical deconditioning   . Symptomatic bradycardia 08/16/15   [employer] PPM, Dr. Ladona Ridgel   Social History   Socioeconomic History  . Marital status: Married    Spouse name: Not on file  . Number of children: Not on file  . Years of education: Not on file  . Highest education level: Not on file  Occupational History  . Not on file  Social Needs  . Financial resource strain: Not on file  . Food insecurity:    Worry: Not on file    Inability: Not on file  . Transportation needs:    Medical: Not on file    Non-medical: Not on file  Tobacco Use  . Smoking status: Never Smoker  . Smokeless tobacco: Never Used  Substance and Sexual Activity  . Alcohol use: No  . Drug use: No  . Sexual activity: Not on file  Lifestyle  . Physical activity:    Days per week: Not on file    Minutes per session: Not on file  . Stress: Not on file  Relationships  . Social connections:    Talks on phone: Not on file    Gets together: Not on file    Attends religious service: Not on file    Active member of club or organization: Not on file    Attends meetings of clubs or organizations: Not on file    Relationship status: Not on file  Other Topics Concern  . Not on file  Social History Narrative  . Not on file   Family History  Problem Relation Age of Onset  . Other Mother        old age  . CAD Father   . CAD Brother    Scheduled Meds: . aspirin  81 mg Oral Daily  . atorvastatin  10 mg Oral q1800  . citalopram  10 mg Oral Daily  . clonazePAM  0.5 mg Oral BID  . feeding supplement  (ENSURE ENLIVE)  237 mL Oral TID BM  . gabapentin  300 mg Oral BID  . heparin  5,000 Units Subcutaneous Q8H  . hydrALAZINE  25 mg Oral TID  . insulin aspart  0-9 Units Subcutaneous TID WC  . multivitamin with minerals  1 tablet Oral Daily  . pantoprazole  40 mg Oral Daily  .  risperiDONE  0.5 mg Oral Daily  . tamsulosin  0.4 mg Oral Daily  . traMADol  50 mg Oral BID   Continuous Infusions: . dextrose     PRN Meds:.[DISCONTINUED] acetaminophen **OR** acetaminophen, haloperidol lactate, hydrALAZINE, [DISCONTINUED] ondansetron **OR** ondansetron (ZOFRAN) IV, risperiDONE Medications Prior to Admission:  Prior to Admission medications   Medication Sig Start Date End Date Taking? Authorizing Provider  acetaminophen (TYLENOL) 500 MG tablet Take 1,000 mg by mouth 3 (three) times daily.   Yes [provider]  aspirin 81 MG chewable tablet Chew 1 tablet (81 mg total) by mouth daily. 02/29/16  Yes Ghimire, Werner LeanShanker M, MD  atorvastatin (LIPITOR) 10 MG tablet Take 10 mg by mouth daily.   Yes [provider]  citalopram (CELEXA) 10 MG tablet Take 10 mg by mouth daily.   Yes [provider]  clonazePAM (KLONOPIN) 0.5 MG tablet Take 0.5 mg by mouth 2 (two) times daily.   Yes [provider]  docusate sodium (COLACE) 100 MG capsule Take 100 mg by mouth 2 (two) times daily.   Yes [provider]  furosemide (LASIX) 20 MG tablet Take 2 tablets (40 mg total) by mouth daily. Reported on 08/17/2015 03/01/16  Yes Ghimire, Werner LeanShanker M, MD  gabapentin (NEURONTIN) 300 MG capsule Take 300 mg by mouth 2 (two) times daily.   Yes [provider]  glipiZIDE (GLUCOTROL) 5 MG tablet Take 5 mg by mouth 2 (two) times daily.  11/14/13  Yes Buriev, Isaiah SergeUlugbek N, MD  hydrALAZINE (APRESOLINE) 25 MG tablet Take 1 tablet (25 mg total) by mouth 2 (two) times daily. Patient taking differently: Take 25 mg by mouth 3 (three) times daily.  02/29/16  Yes Ghimire, Werner LeanShanker M, MD  metFORMIN  (GLUCOPHAGE) 1000 MG tablet Take 1,000 mg by mouth every evening.    Yes [provider]  nitroGLYCERIN (NITROSTAT) 0.4 MG SL tablet Place 1 tablet (0.4 mg total) under the tongue every 5 (five) minutes as needed for chest pain (if more than 3 needed, please call your doctor.). 12/21/17  Yes Arrien, York RamMauricio Daniel, MD  pantoprazole (PROTONIX) 40 MG tablet Take 1 tablet (40 mg total) by mouth daily. 01/19/16  Yes Richarda OverlieAbrol, Nayana, MD  polyethylene glycol (MIRALAX / GLYCOLAX) packet Take 17 g by mouth daily. Mix with 8 ounces of liquid and drink/Hold for loose stools   Yes [provider]  risperiDONE (RISPERDAL) 0.5 MG tablet Take 0.5 mg by mouth daily. Additional Order: Give 0.25mg  by mouth twice daily as needed for aggitation   Yes [provider]  tamsulosin (FLOMAX) 0.4 MG CAPS capsule Take 0.4 mg by mouth daily.  02/12/15  Yes [provider]  traMADol (ULTRAM) 50 MG tablet Take 50 mg by mouth 2 (two) times daily.   Yes [provider]   No Known Allergies Review of Systems Doesn't verbalize or interact  Physical Exam Elderly gentleman Resting in bed In no distress Opens eyes to voice command Does not interact Shallow clear breathing No edema Abdomen is not distended  Vital Signs: BP 135/82 (BP Location: Left Arm)   Pulse 81   Temp 98.5 F (36.9 C) (Oral)   Resp 16   Ht 6\' 1"  (1.854 m)   Wt 63.1 kg (139 lb 3.2 oz) Comment: with blankets off  SpO2 100%   BMI 18.37 kg/m  Pain Scale: 0-10 POSS *See Group Information*: 1-Acceptable,Awake and alert Pain Score: 0-No pain   SpO2: SpO2: 100 % O2 Device:SpO2: 100 % O2 Flow  Rate: .   IO: Intake/output summary:   Intake/Output Summary (Last 24 hours) at 01/22/2018 0916 Last data filed at 01/22/2018 0641 Gross per 24 hour  Intake 2591.66 ml  Output 800 ml  Net 1791.66 ml    LBM: Last BM Date: 01/20/18 Baseline Weight: Weight: 67.9 kg (149 lb 11.2 oz)(three blankets on) Most recent  weight: Weight: 63.1 kg (139 lb 3.2 oz)(with blankets off)     Palliative Assessment/Data:   PPS 30%  Time In:     8 Time Out:  9 Time Total: 60   Greater than 50%  of this time was spent counseling and coordinating care related to the above assessment and plan.  Signed by: Rosalin Hawking, MD  [phone]  Please contact Palliative Medicine Team phone at [phone] for questions and concerns.  For individual provider: See Loretha Stapler

## 2018-01-22 NOTE — Progress Notes (Addendum)
CSW reached out to patient Harold Jones to discuss to residential hospice placement. CSW informed Harold of the facility options. She has requested to discuss with the patient daughter (her mother) to determine a choice.   Patient family has decided to go with Harold Jones. [facility]   [name] will accept the patient today.   Liaison will complete paperwork with family.   CSW inform physician to complete d/c summary.   Vivi BarrackNicole Zurii Hewes, Alexander MtLCSW, MSW Clinical Social Worker  [phone] 01/22/2018  11:55 AM

## 2018-01-22 NOTE — Discharge Summary (Signed)
Physician Discharge Summary  Harold Jones  ZOX:[id number]  DOB: 04-07-1928  DOA: 01/20/2018 PCP: Mortimer Fries, PA  Admit date: 01/20/2018 Discharge date: 01/22/2018  Admitted From: SNF  Disposition:  [facility]   Discharge Condition: Hospice CODE STATUS: DNR Diet recommendation: Regular  Brief/Interim Summary: For full details see H&P/Progress note, but in brief, Harold Jones is a 84 year old male with past medical history of dementia, CAD, diabetes mellitus, hypertension who was sent from the nursing home due to history of diarrhea and increasing confusion.  Upon ED evaluation he was found to be dehydrated with hypernatremia, acute kidney injury and severely confused.  He was admitted with working diagnosis of acute renal failure in setting of dehydration with hypernatremia.  During hospital stay patient was very confused, sodium and behavior did not improve despite fluid resuscitation, given his dementia and lack of following commands palliative care was consulted and deemed the patient will benefit from hospice care.  Case was discussed with daughter and decision was made to make patient comfortable and placed on  comfort measures.  Patient has been made DNR and comfort measures started, patient will be transferred to residential hospice for further care.  Discharge Diagnoses/Hospital Course:  Principal Problem:   Hypernatremia Active Problems:   Essential hypertension   Diabetes mellitus, type 2 (HCC)   CKD (chronic kidney disease) stage 3, GFR 30-59 ml/min (HCC)   Dementia   AKI (acute kidney injury) (HCC)   Diarrhea   Malnutrition of moderate degree  Discharge Instructions  You were cared for by a hospitalist during your hospital stay. If you have any questions about your discharge medications or the care you received while you were in the hospital after you are discharged, you can call the unit and asked to speak with the hospitalist on call if the hospitalist  that took care of you is not available. Once you are discharged, your primary care physician will handle any further medical issues. Please note that NO REFILLS for any discharge medications will be authorized once you are discharged, as it is imperative that you return to your primary care physician (or establish a relationship with a primary care physician if you do not have one) for your aftercare needs so that they can reassess your need for medications and monitor your lab values.  Discharge Instructions    Call MD for:  difficulty breathing, headache or visual disturbances   Complete by:  As directed    Call MD for:  extreme fatigue   Complete by:  As directed    Call MD for:  hives   Complete by:  As directed    Call MD for:  persistant dizziness or light-headedness   Complete by:  As directed    Call MD for:  persistant nausea and vomiting   Complete by:  As directed    Call MD for:  redness, tenderness, or signs of infection (pain, swelling, redness, odor or green/yellow discharge around incision site)   Complete by:  As directed    Call MD for:  severe uncontrolled pain   Complete by:  As directed    Call MD for:  temperature >100.4   Complete by:  As directed    Diet - low sodium heart healthy   Complete by:  As directed      Allergies as of 01/22/2018   No Known Allergies     Medication List    STOP taking these medications   acetaminophen 500 MG tablet Commonly known  as:  TYLENOL   aspirin 81 MG chewable tablet   atorvastatin 10 MG tablet Commonly known as:  LIPITOR   citalopram 10 MG tablet Commonly known as:  CELEXA   clonazePAM 0.5 MG tablet Commonly known as:  KLONOPIN   docusate sodium 100 MG capsule Commonly known as:  COLACE   furosemide 20 MG tablet Commonly known as:  LASIX   gabapentin 300 MG capsule Commonly known as:  NEURONTIN   glipiZIDE 5 MG tablet Commonly known as:  GLUCOTROL   hydrALAZINE 25 MG tablet Commonly known as:   APRESOLINE   metFORMIN 1000 MG tablet Commonly known as:  GLUCOPHAGE   nitroGLYCERIN 0.4 MG SL tablet Commonly known as:  NITROSTAT   pantoprazole 40 MG tablet Commonly known as:  PROTONIX   polyethylene glycol packet Commonly known as:  MIRALAX / GLYCOLAX   risperiDONE 0.5 MG tablet Commonly known as:  RISPERDAL   tamsulosin 0.4 MG Caps capsule Commonly known as:  FLOMAX   traMADol 50 MG tablet Commonly known as:  ULTRAM       No Known Allergies  Consultations:  Palliative care   Procedures/Studies: Ct Abdomen Pelvis Wo Contrast  Result Date: 12/30/2017 CLINICAL DATA:  Weight loss and upper abdominal pain EXAM: CT ABDOMEN AND PELVIS WITHOUT CONTRAST TECHNIQUE: Multidetector CT imaging of the abdomen and pelvis was performed following the standard protocol without IV contrast. COMPARISON:  CT AP 01/12/2016 FINDINGS: LOWER CHEST: No basilar pulmonary nodules or pleural effusion. No apical pericardial effusion. HEPATOBILIARY: Normal hepatic contours and density. No intra- or extrahepatic biliary dilatation. Normal gallbladder. PANCREAS: Normal parenchymal contours without ductal dilatation. No peripancreatic fluid collection. SPLEEN: Normal. ADRENALS/URINARY TRACT: --Adrenal glands: Normal. --Right kidney/ureter: No hydronephrosis, nephroureterolithiasis, perinephric stranding or solid renal mass. Renal cysts measure up to 2.5 cm. --Left kidney/ureter: No hydronephrosis, nephroureterolithiasis, perinephric stranding or solid renal mass. Renal cyst measures 5 cm. --Urinary bladder: Normal for degree of distention STOMACH/BOWEL: --Stomach/Duodenum: No hiatal hernia or other gastric abnormality. Normal duodenal course. --Small bowel: No dilatation or inflammation. --Colon: No focal abnormality. --Appendix: Normal. VASCULAR/LYMPHATIC: Atherosclerotic calcification is present within the non-aneurysmal abdominal aorta, without hemodynamically significant stenosis. No abdominal or pelvic  lymphadenopathy. REPRODUCTIVE: Asymmetric density within the right prostate gland. MUSCULOSKELETAL. No bony spinal canal stenosis or focal osseous abnormality. OTHER: None. IMPRESSION: 1. No acute abdominal or pelvic abnormality. 2. Asymmetric density within the right prostate gland, possibly representing a small amount of blood. Query recent biopsy. 3.  Aortic Atherosclerosis (ICD10-I70.0). Electronically Signed   By: Deatra Robinson M.D.   On: 12/30/2017 01:17   Dg Chest 2 View  Result Date: 12/29/2017 CLINICAL DATA:  Chest pain EXAM: CHEST - 2 VIEW COMPARISON:  12/19/2017, 10/02/2017 FINDINGS: Post sternotomy changes. Valve prosthesis. Similar appearance of left-sided pacing device. No acute airspace disease or effusion. Stable mildly enlarged cardiomediastinal silhouette. No pneumothorax. IMPRESSION: No active cardiopulmonary disease.  Stable mild cardiomegaly. Electronically Signed   By: Jasmine Pang M.D.   On: 12/29/2017 18:33   Dg Chest Port 1 View  Result Date: 01/03/2018 CLINICAL DATA:  Acute chest pain. EXAM: PORTABLE CHEST 1 VIEW COMPARISON:  12/29/2017 and prior exam FINDINGS: Cardiomegaly, aortic valve replacement and LEFT-sided pacemaker again noted in this low volume film. There is no evidence of focal airspace disease, pulmonary edema, suspicious pulmonary nodule/mass, pleural effusion, or pneumothorax. No acute bony abnormalities are identified. IMPRESSION: Cardiomegaly without evidence of acute cardiopulmonary disease. Electronically Signed   By: Harmon Pier M.D.   On: 01/03/2018  20:08   Discharge Exam: Vitals:   01/21/18 2223 01/22/18 0319  BP: (!) 162/88 135/82  Pulse: 80 81  Resp: 16 16  Temp: 98.5 F (36.9 C)   SpO2: 97% 100%   Vitals:   01/21/18 1118 01/21/18 1300 01/21/18 2223 01/22/18 0319  BP: 129/84 133/77 (!) 162/88 135/82  Pulse: 89 96 80 81  Resp: 15 16 16 16   Temp:   98.5 F (36.9 C)   TempSrc:   Oral   SpO2:  98% 97% 100%  Weight:      Height:         General: Confused, agitated  Cardiovascular: RRR, S1/S2 +, no rubs, no gallops Respiratory: CTA bilaterally, no wheezing, no rhonchi   The results of significant diagnostics from this hospitalization (including imaging, microbiology, ancillary and laboratory) are listed below for reference.     Microbiology: Recent Results (from the past 240 hour(s))  MRSA PCR Screening     Status: None   Collection Time: 01/21/18  3:23 PM  Result Value Ref Range Status   MRSA by PCR NEGATIVE NEGATIVE Final    Comment:        The GeneXpert MRSA Assay (FDA approved for NASAL specimens only), is one component of a comprehensive MRSA colonization surveillance program. It is not intended to diagnose MRSA infection nor to guide or monitor treatment for MRSA infections. Performed at [facility], [address], [place], [place] [address]      Labs: BNP (last 3 results) Recent Labs    04/20/17 1514  BNP 285.8*   Basic Metabolic Panel: Recent Labs  Lab 01/21/18 0537 01/21/18 1108 01/21/18 1701 01/21/18 2319 01/22/18 0614  NA 161* 161* 163* 165* 165*  K 4.3 3.8 4.1 4.1 4.1  CL 124* 127* 128* >130* >130*  CO2 25 23 24 22 23   GLUCOSE 168* 164* 143* 113* 145*  BUN 86* 85* 81* 75* 68*  CREATININE 2.39* 2.30* 2.12* 1.99* 1.84*  CALCIUM 10.2 9.9 9.9 9.9 9.9   Liver Function Tests: Recent Labs  Lab 01/20/18 2127  AST 49*  ALT 37  ALKPHOS 54  BILITOT 1.6*  PROT 8.2*  ALBUMIN 4.4   Recent Labs  Lab 01/20/18 2127  LIPASE 43   No results for input(s): AMMONIA in the last 168 hours. CBC: Recent Labs  Lab 01/20/18 2127 01/22/18 0832  WBC 8.2 6.9  HGB 14.2 12.2*  HCT 46.3 40.2  MCV 83.1 83.8  PLT 146* 127*   Cardiac Enzymes: Recent Labs  Lab 01/21/18 1108 01/21/18 1701 01/21/18 2319  TROPONINI 0.25* 0.28* 0.27*   BNP: Invalid input(s): POCBNP CBG: Recent Labs  Lab 01/21/18 1150 01/21/18 1818 01/21/18 2256 01/22/18 0706 01/22/18 1119   GLUCAP 125* 124* 107* 138* 163*   D-Dimer No results for input(s): DDIMER in the last 72 hours. Hgb A1c No results for input(s): HGBA1C in the last 72 hours. Lipid Profile No results for input(s): CHOL, HDL, LDLCALC, TRIG, CHOLHDL, LDLDIRECT in the last 72 hours. Thyroid function studies No results for input(s): TSH, T4TOTAL, T3FREE, THYROIDAB in the last 72 hours.  Invalid input(s): FREET3 Anemia work up No results for input(s): VITAMINB12, FOLATE, FERRITIN, TIBC, IRON, RETICCTPCT in the last 72 hours. Urinalysis    Component Value Date/Time   COLORURINE YELLOW 01/21/2018 0500   APPEARANCEUR HAZY (A) 01/21/2018 0500   LABSPEC 1.019 01/21/2018 0500   PHURINE 5.0 01/21/2018 0500   GLUCOSEU NEGATIVE 01/21/2018 0500   HGBUR SMALL (A) 01/21/2018 0500  BILIRUBINUR NEGATIVE 01/21/2018 0500   KETONESUR 5 (A) 01/21/2018 0500   PROTEINUR 30 (A) 01/21/2018 0500   UROBILINOGEN 0.2 03/01/2015 1940   NITRITE NEGATIVE 01/21/2018 0500   LEUKOCYTESUR NEGATIVE 01/21/2018 0500   Sepsis Labs Invalid input(s): PROCALCITONIN,  WBC,  LACTICIDVEN Microbiology Recent Results (from the past 240 hour(s))  MRSA PCR Screening     Status: None   Collection Time: 01/21/18  3:23 PM  Result Value Ref Range Status   MRSA by PCR NEGATIVE NEGATIVE Final    Comment:        The GeneXpert MRSA Assay (FDA approved for NASAL specimens only), is one component of a comprehensive MRSA colonization surveillance program. It is not intended to diagnose MRSA infection nor to guide or monitor treatment for MRSA infections. Performed at [facility], [address] [place], [place] [address]     Time coordinating discharge: 25 minutes  SIGNED:  Latrelle DodrillEdwin Silva, MD  [facility] 01/22/2018, 2:04 PM  Pager please text page via  [email or web address]  Note - This record has been created using [employer] software. Chart creation errors have been sought, but may not always have been located.  Such creation errors do not reflect on the standard of medical care.

## 2018-01-22 NOTE — Evaluation (Addendum)
Physical Therapy Evaluation Patient Details Name: Harold Jones MRN: [id number] DOB: April 05, 1928 Today's Date: 01/22/2018   History of Present Illness  84 yo gentleman from [place], history of dementia, CAD DM HTN. He is from [place]. He has been admitted with dehydration,hypernatremia, AKI, encephalopathy  Clinical Impression  Pt not responsive to use of interpreter per RN, family did not answer phone when I attempted to call to obtain his prior level of function. Noted from PT note on 12/21/17 that pt ambulated 72' with RW. Today he required mod assist for supine to sit, mod assist sit to stand, and +2 min assist (for safety) to ambulate 17' with RW. He is a high fall risk as he pushes walker too far forward. Pt declined offer of food from his lunch tray.  24* supervision at SNF level recommended. Noted pt likely to DC to [facility]. PT will sign off.     Follow Up Recommendations SNF;Supervision/Assistance - 24 hour;Supervision for mobility/OOB    Equipment Recommendations  None recommended by PT    Recommendations for Other Services       Precautions / Restrictions Precautions Precautions: Fall Restrictions Weight Bearing Restrictions: No      Mobility  Bed Mobility Overal bed mobility: Needs Assistance Bed Mobility: Supine to Sit     Supine to sit: Mod assist        Transfers Overall transfer level: Needs assistance Equipment used: Rolling walker (2 wheeled) Transfers: Sit to/from Stand Sit to Stand: Mod assist         General transfer comment: assist to power up  Ambulation/Gait Ambulation/Gait assistance: +2 safety/equipment;Min assist Gait Distance (Feet): 50 Feet Assistive device: Rolling walker (2 wheeled) Gait Pattern/deviations: Step-through pattern;Trunk flexed     General Gait Details: pt pushes RW too far forward, not responsive to manual cues to correct positioning in RW, not responsive to use of translator per RN  Stairs             Wheelchair Mobility    Modified Rankin (Stroke Patients Only)       Balance Overall balance assessment: Needs assistance Sitting-balance support: Feet supported Sitting balance-Leahy Scale: Fair       Standing balance-Leahy Scale: Poor                               Pertinent Vitals/Pain Pain Assessment: Faces Faces Pain Scale: No hurt    Home Living Family/patient expects to be discharged to:: Skilled nursing facility                 Additional Comments: per chart, he is from [place]    Prior Function           Comments: unsure of PLOF, pt not responsive to use of translator, no family present, family did not answer phone when I called     Hand Dominance        Extremity/Trunk Assessment   Upper Extremity Assessment Upper Extremity Assessment: Generalized weakness;Difficult to assess due to impaired cognition    Lower Extremity Assessment Lower Extremity Assessment: Generalized weakness;Difficult to assess due to impaired cognition    Cervical / Trunk Assessment Cervical / Trunk Assessment: Kyphotic  Communication   Communication: Prefers language other than English(Bosnian)  Cognition Arousal/Alertness: Awake/alert Behavior During Therapy: WFL for tasks assessed/performed Overall Cognitive Status: No family/caregiver present to determine baseline cognitive functioning  General Comments      Exercises     Assessment/Plan    PT Assessment All further PT needs can be met in the next venue of care(pt to DC today to [facility])  PT Problem List Decreased balance;Decreased mobility;Decreased activity tolerance;Decreased cognition       PT Treatment Interventions      PT Goals (Current goals can be found in the Care Plan section)  Acute Rehab PT Goals PT Goal Formulation: All assessment and education complete, DC therapy    Frequency     Barriers  to discharge        Co-evaluation               AM-PAC PT "6 Clicks" Daily Activity  Outcome Measure Difficulty turning over in bed (including adjusting bedclothes, sheets and blankets)?: Unable Difficulty moving from lying on back to sitting on the side of the bed? : Unable Difficulty sitting down on and standing up from a chair with arms (e.g., wheelchair, bedside commode, etc,.)?: Unable Help needed moving to and from a bed to chair (including a wheelchair)?: A Lot Help needed walking in hospital room?: A Lot Help needed climbing 3-5 steps with a railing? : Total 6 Click Score: 8    End of Session Equipment Utilized During Treatment: Gait belt Activity Tolerance: Patient tolerated treatment well Patient left: in bed;with call bell/phone within reach;with bed alarm set Nurse Communication: Mobility status(condom cath needs to be replaced)      Time: 5189-8421 PT Time Calculation (min) (ACUTE ONLY): 21 min   Charges:   PT Evaluation $PT Eval Moderate Complexity: 1 Mod     PT G Codes:          Philomena Doheny 01/22/2018, 1:54 PM [phone]

## 2018-01-22 NOTE — Progress Notes (Signed)
Report given to [facility].  

## 2018-01-22 NOTE — Progress Notes (Addendum)
Received critical lab value of sodium 165 and chloride greater than 130, paged MD on call with No new order.

## 2018-01-22 NOTE — Progress Notes (Signed)
Patient refuses to take any medications by mouth, but attempted 3 times.

## 2018-01-27 ENCOUNTER — Ambulatory Visit (HOSPITAL_COMMUNITY): Admission: RE | Admit: 2018-01-27 | Payer: Medicare Other | Source: Ambulatory Visit

## 2018-01-27 ENCOUNTER — Encounter (HOSPITAL_COMMUNITY): Payer: Self-pay

## 2018-01-27 ENCOUNTER — Ambulatory Visit (HOSPITAL_COMMUNITY)
Admission: RE | Admit: 2018-01-27 | Discharge: 2018-01-27 | Disposition: A | Discharge status: Home / Self Care | Payer: Medicare Other | Source: Ambulatory Visit | Attending: Internal Medicine | Admitting: Internal Medicine

## 2018-01-30 DEATH — deceased

## 2019-11-03 IMAGING — CT CT CHEST W/ CM
2 of 3 series · 15 of 36 positions shown, 18 images · IV contrast (omnipaque)
Comparison: CT chest March 21, 2017 in chest radiograph [facility]

CLINICAL DATA: Chest pain. Hemoptysis. History of dementia,
coronary artery disease, ascending aortic aneurysm.

EXAM:
CT CHEST WITH CONTRAST
TECHNIQUE: Multidetector CT imaging of the chest was performed during
intravenous contrast administration.
CONTRAST:  60mL OMNIPAQUE IOHEXOL 300 MG/ML  SOLN

[Series 3: axial st · axial · 0.75mm/px · z∈[+1312,+1612]mm · 12 of 178 slices shown, 15 images]
[im 14/178  mediastinal]
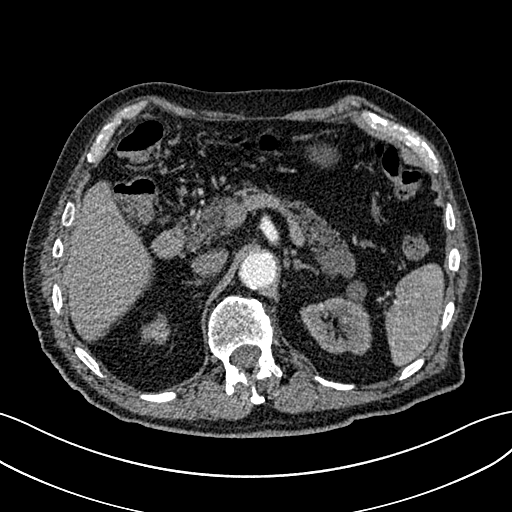
[im 14/178  lung]
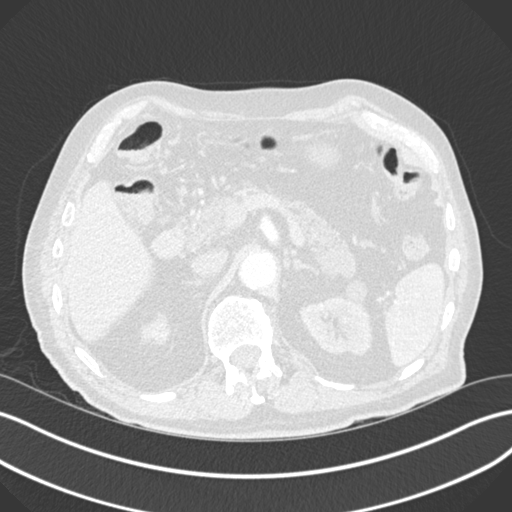
[im 27/178  lung]
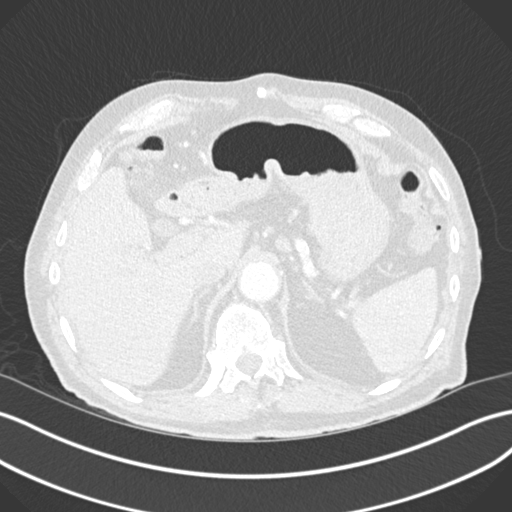
[im 40/178  lung]
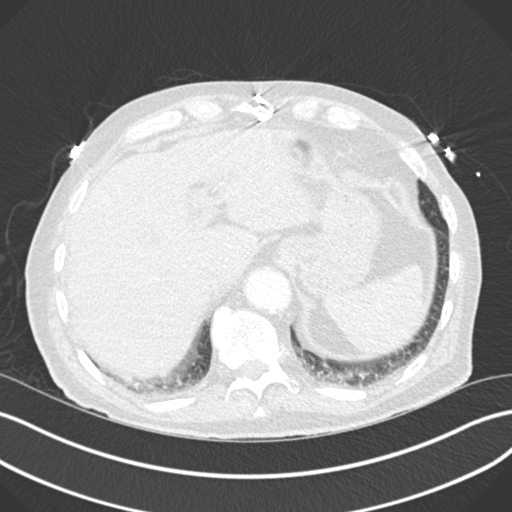
[im 53/178  lung]
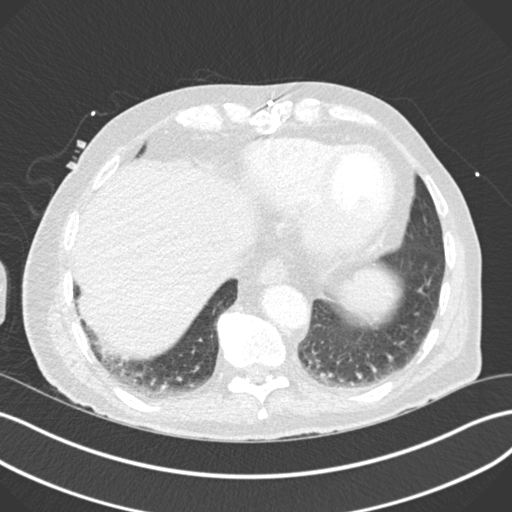
[im 66/178  mediastinal]
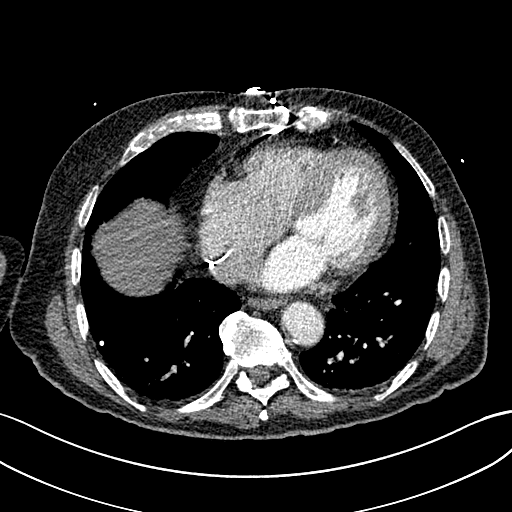
[im 66/178  lung]
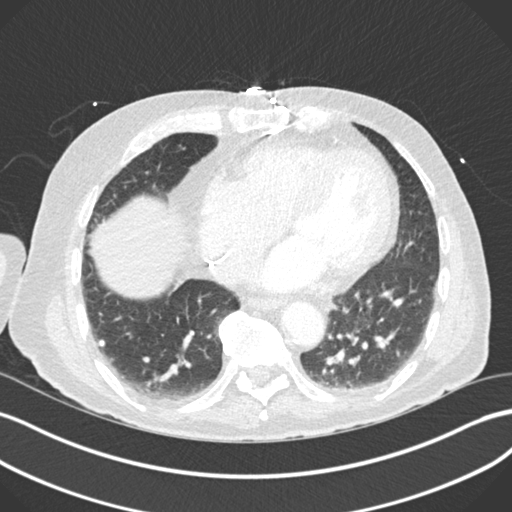
[im 79/178  lung]
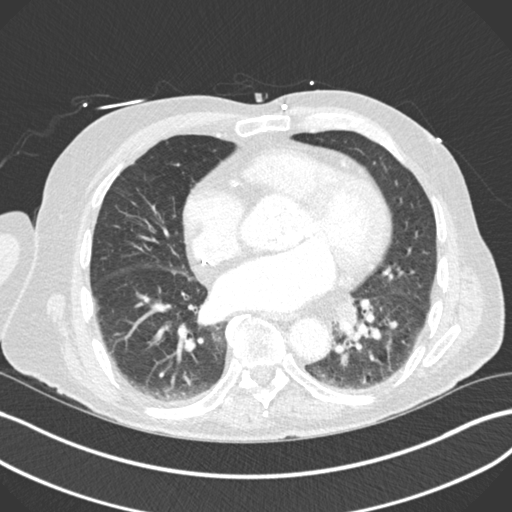
[im 99/178  lung]
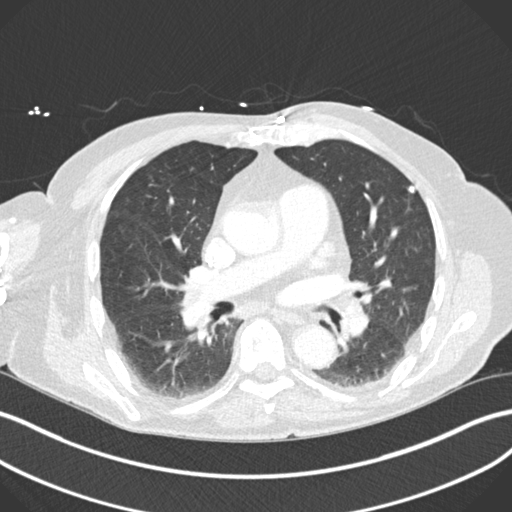
[im 112/178  lung]
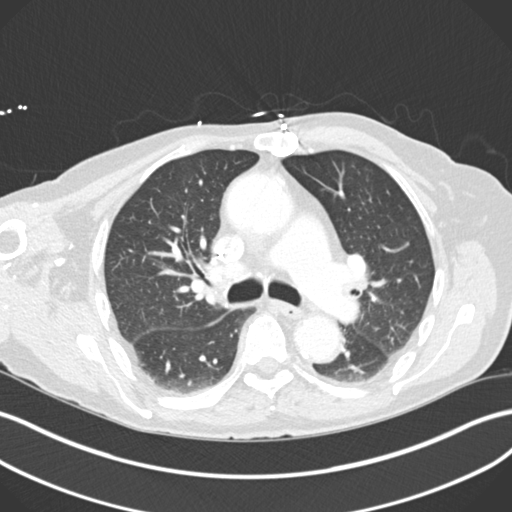
[im 125/178  mediastinal]
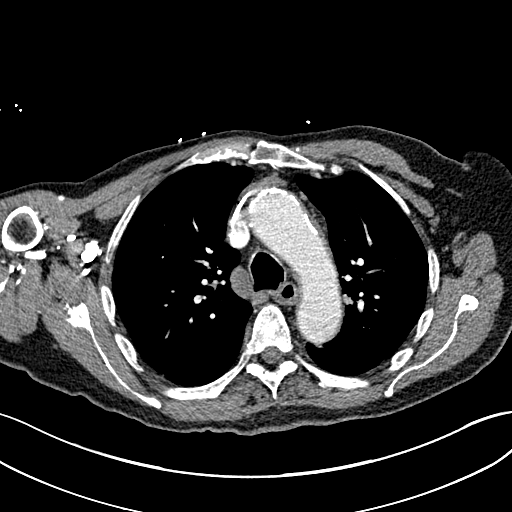
[im 125/178  lung]
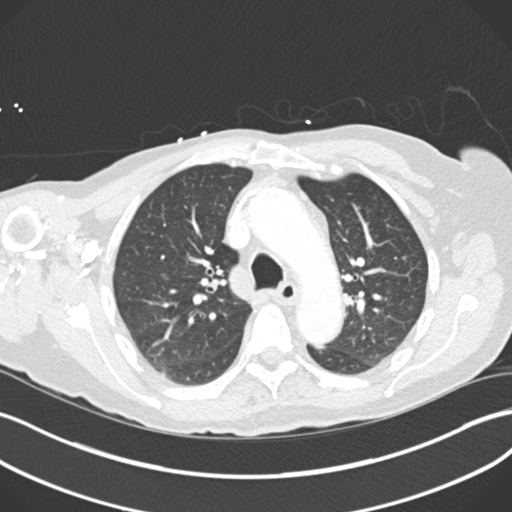
[im 138/178  lung]
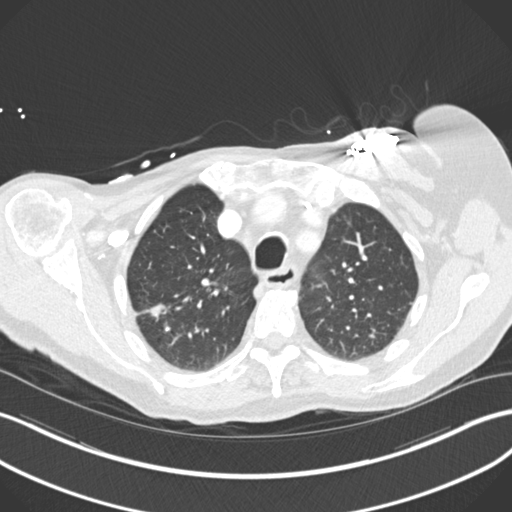
[im 151/178  lung]
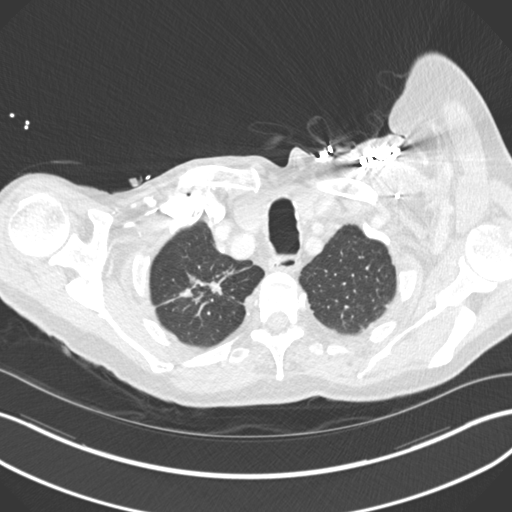
[im 164/178  lung]
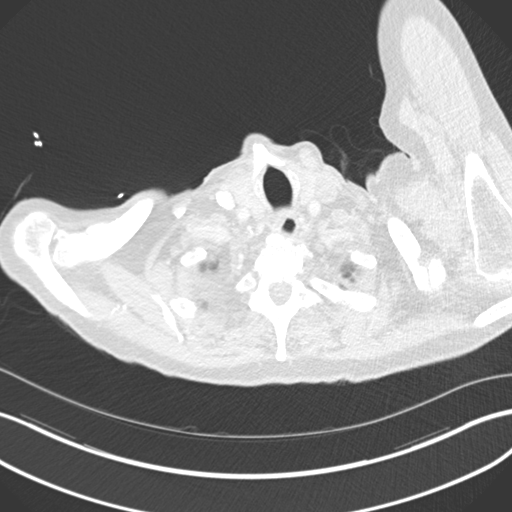

[Series 7: coronal · coronal · 0.73mm/px · 3 of 138 slices shown]
[im 28/138  lung]
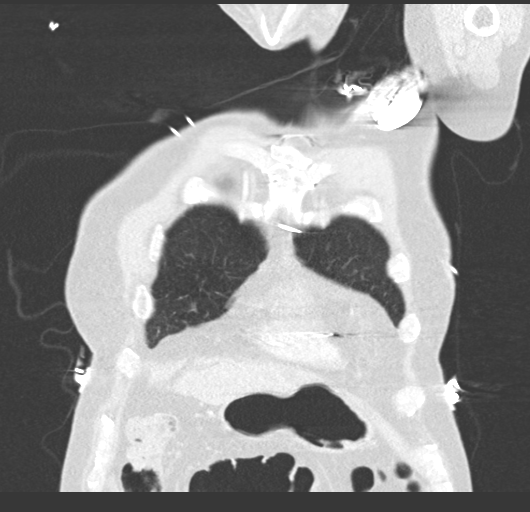
[im 55/138  lung]
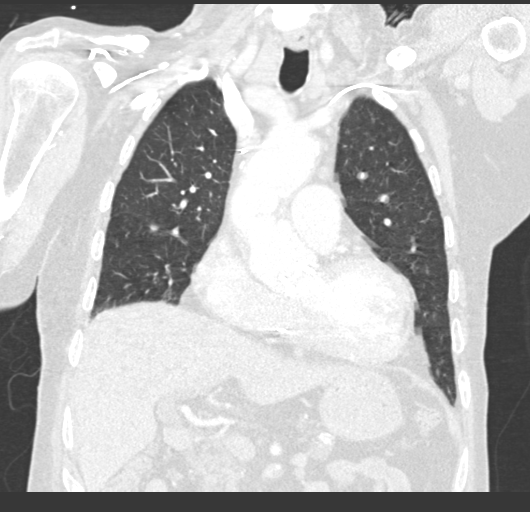
[im 83/138  lung]
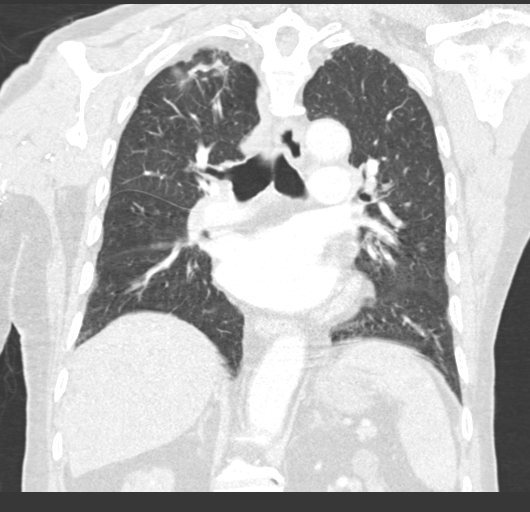

[15 of 36 positions shown; findings below may reference images not displayed]

FINDINGS: CARDIOVASCULAR: Stable 4.2 cm ascending aorta with irregular intimal
thickening calcific atherosclerosis. Descending aorta is normal in
course and caliber. Heart is mildly enlarged. No pericardial
effusion. Mild coronary artery calcification. Status post aortic
valve replacement.

MEDIASTINUM/NODES: No mediastinal mass. No lymphadenopathy by CT
size criteria. Normal appearance of thoracic esophagus though not
tailored for evaluation.

LUNGS/PLEURA: Tracheobronchial tree is patent, no pneumothorax.
Trace bronchial wall thickening. Stable 5 mm RIGHT middle lobe
pulmonary nodules (series 6, image 100 and 8 and 83). Scattered
calcified granulomas. Stable spiculated RIGHT apical pleural
thickening measuring 18 x 15 mm with stable 8 mm nodular scarring
(series 6, image 41).

UPPER ABDOMEN: Nonacute. Partially imaged bilateral renal cysts
measuring to 2.4 cm on the RIGHT. Status post cholecystectomy.
Stable small gas filled duodenal diverticulum at the level the
pancreatic head.

MUSCULOSKELETAL: Nonacute. Status post median sternotomy. Mild
degenerative change of the spine.
IMPRESSION: 1. Stable 4.2 cm ascending aortic aneurysm. Recommend annual imaging
followup by CTA or MRA. This recommendation follows 0868
ACCF/AHA/AATS/ACR/ASA/SCA/HLOMZA/HARUO/BRAWNER/DADISH Guidelines for the
Diagnosis and Management of Patients with Thoracic Aortic Disease.
Circulation. 0868; 121: [id number].
2. Mild bronchial wall thickening seen with bronchitis or reactive
airway disease. No pneumonia.
3. Stable pulmonary nodules and RIGHT apical pleuroparenchymal
scarring.
4. Stable cardiomegaly.  Status post AVR.

Aortic Atherosclerosis ([id number]).

## 2019-11-03 IMAGING — CT CT HEAD W/O CM
3 series · 14 of 47 positions shown, 16 images · non-contrast
Comparison: 01/16/2016

CLINICAL DATA: Headaches

EXAM:
CT HEAD WITHOUT CONTRAST
TECHNIQUE: Contiguous axial images were obtained from the base of the skull
through the vertex without intravenous contrast.

[Series 2: head wo · axial · 0.47mm/px · z∈[-178,-48]mm · 8 of 32 slices shown, 10 images]
[im 3/32  brain]
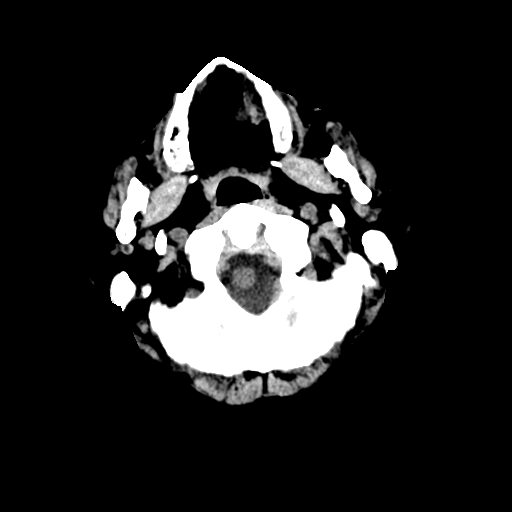
[im 3/32  bone]
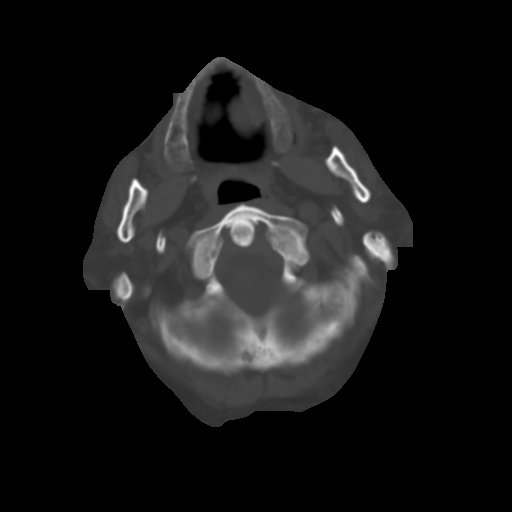
[im 7/32  brain]
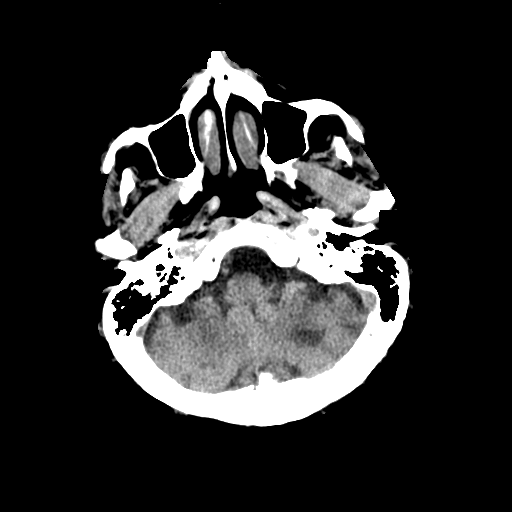
[im 10/32  brain]
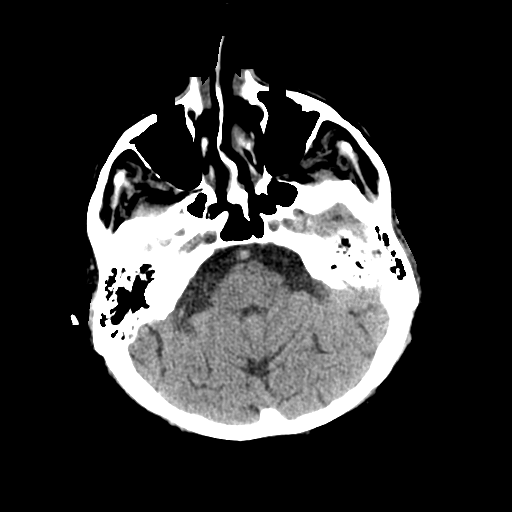
[im 14/32  brain]
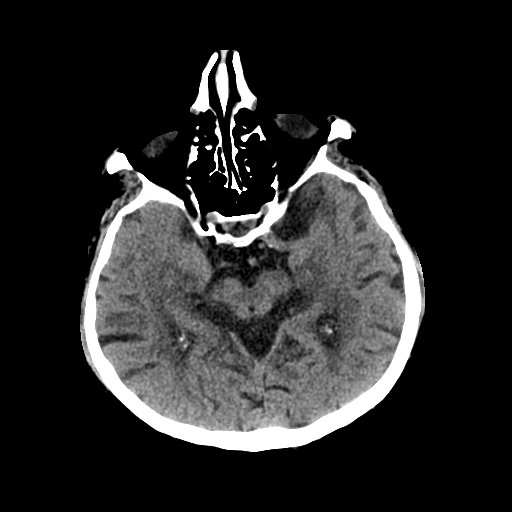
[im 18/32  brain]
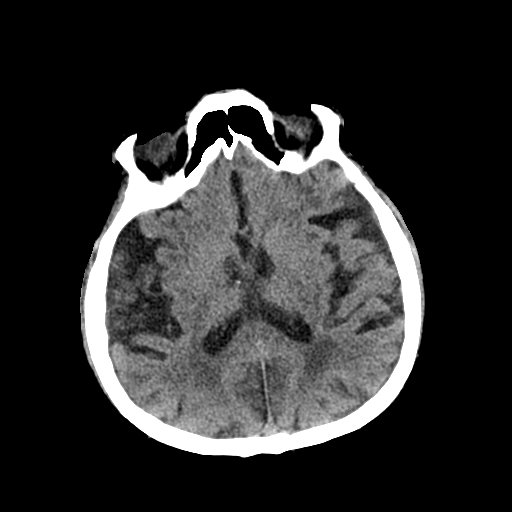
[im 18/32  bone]
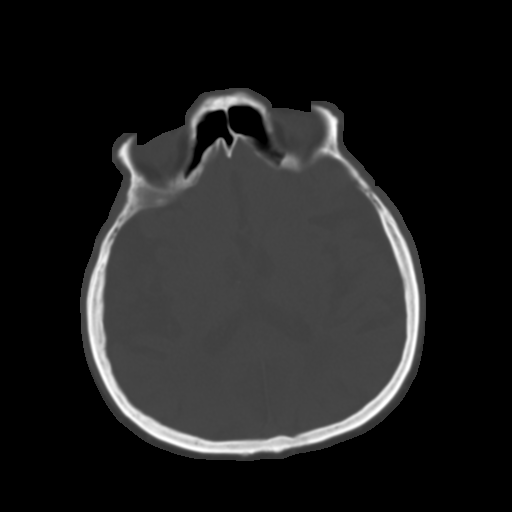
[im 22/32  brain]
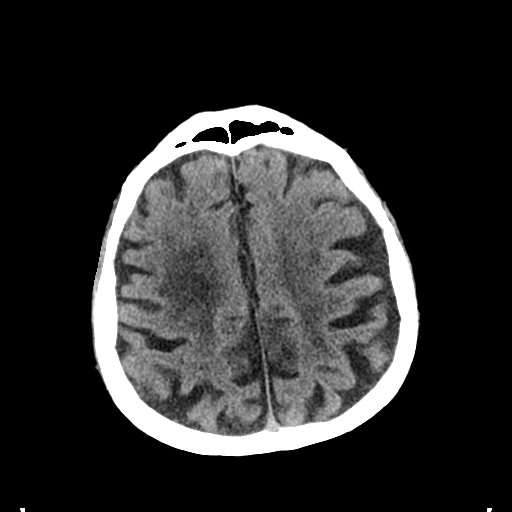
[im 25/32  brain]
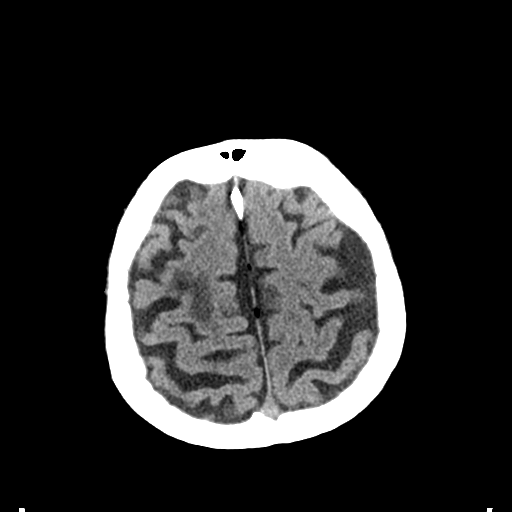
[im 29/32  brain]
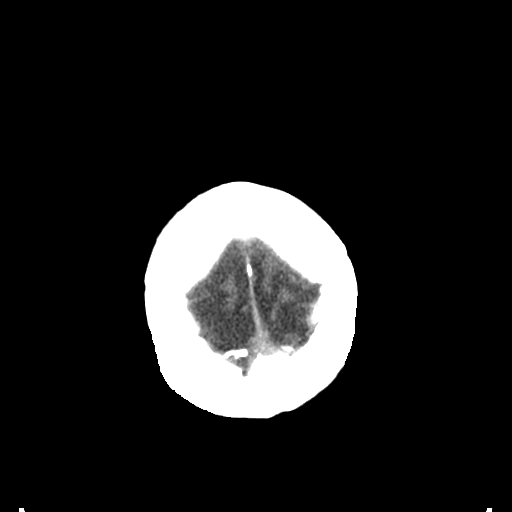

[Series 4: coronal soft tissue · coronal · 0.36mm/px · 3 of 58 slices shown]
[im 20/58  brain]
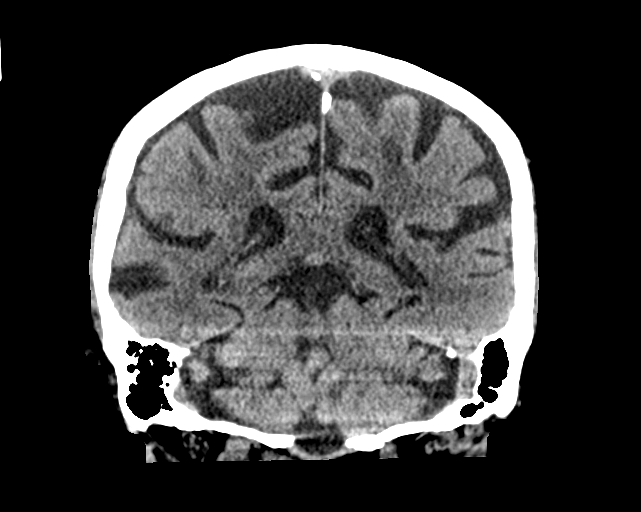
[im 26/58  brain]
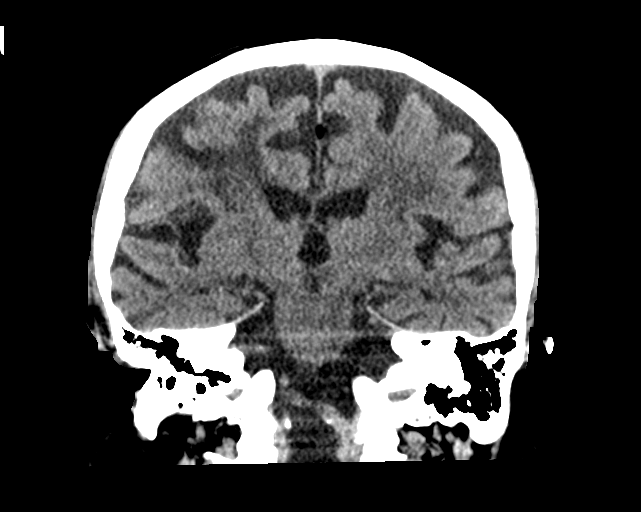
[im 32/58  brain]
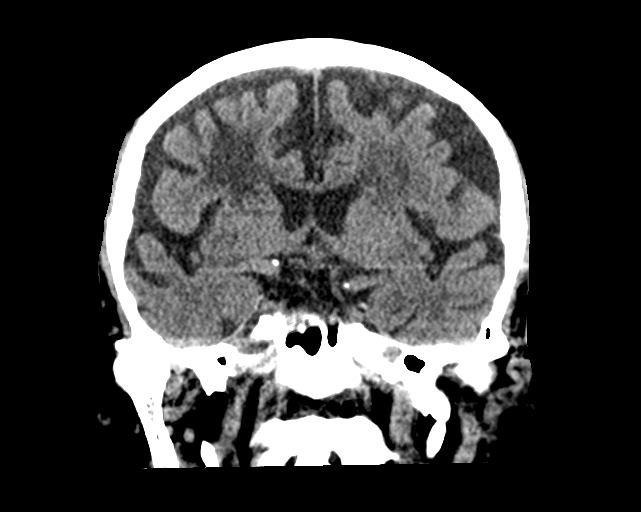

[Series 5: sagittal soft tissue · sagittal · 0.38mm/px · 3 of 53 slices shown]
[im 18/53  brain]
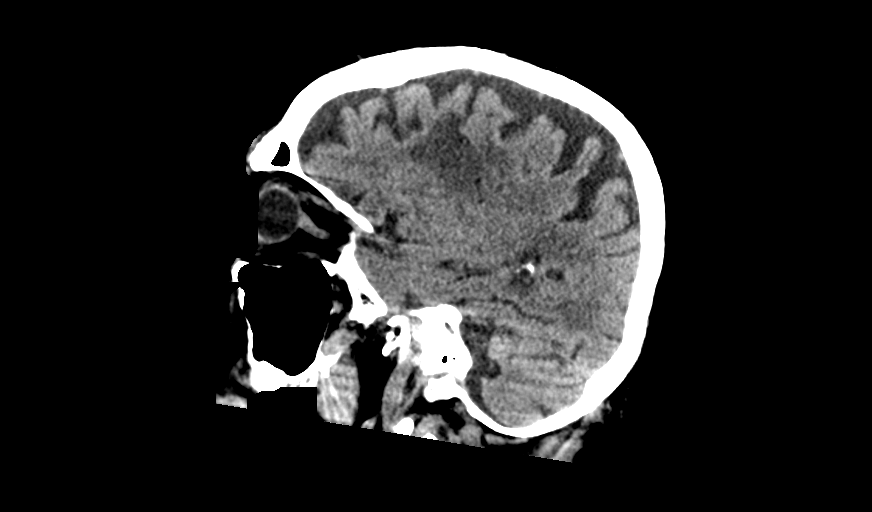
[im 27/53  brain]
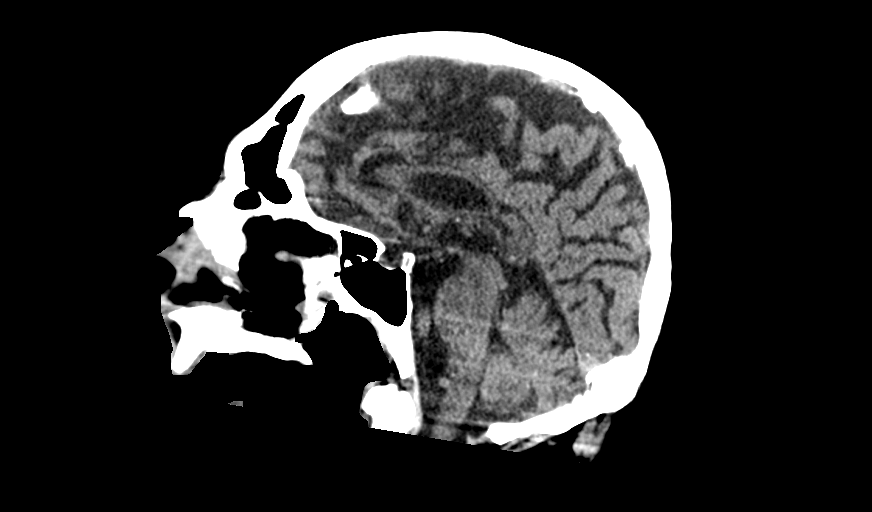
[im 35/53  brain]
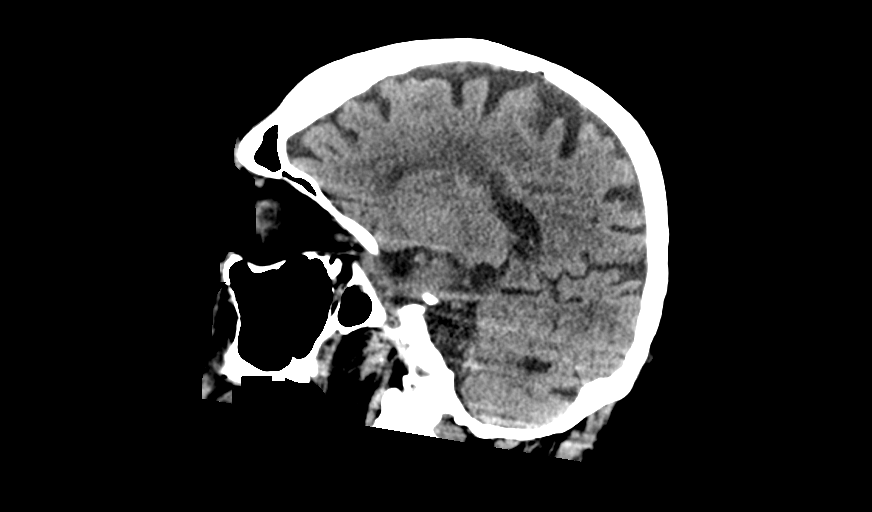

[14 of 47 positions shown; findings below may reference images not displayed]

FINDINGS: Brain: Diffuse atrophic changes are noted. Old left cerebellar
infarct is noted. Chronic white matter ischemic changes are again
seen similar to that noted on the prior exam. No findings to suggest
acute hemorrhage, acute infarction or space-occupying mass lesion
are noted.

Vascular: No hyperdense vessel or unexpected calcification.

Skull: Normal. Negative for fracture or focal lesion.

Sinuses/Orbits: No acute finding.

Other: None.
IMPRESSION: Chronic atrophic and ischemic changes stable from the prior exam.
